# Patient Record
Sex: Female | Born: 1945 | Race: White | Hispanic: No | Marital: Married | Smoking: Never smoker
Health system: Southern US, Community
[De-identification: ages and names within clinical notes are randomized; demographics above are authoritative.]

## PROBLEM LIST (undated history)

## (undated) ENCOUNTER — Emergency Department (HOSPITAL_COMMUNITY): Admission: EM | Payer: 59 | Source: Home / Self Care

## (undated) DIAGNOSIS — I4891 Unspecified atrial fibrillation: Secondary | ICD-10-CM

## (undated) DIAGNOSIS — M810 Age-related osteoporosis without current pathological fracture: Secondary | ICD-10-CM

## (undated) DIAGNOSIS — I639 Cerebral infarction, unspecified: Secondary | ICD-10-CM

## (undated) DIAGNOSIS — Q2112 Patent foramen ovale: Secondary | ICD-10-CM

## (undated) DIAGNOSIS — D751 Secondary polycythemia: Secondary | ICD-10-CM

## (undated) DIAGNOSIS — E7800 Pure hypercholesterolemia, unspecified: Secondary | ICD-10-CM

## (undated) DIAGNOSIS — D66 Hereditary factor VIII deficiency: Secondary | ICD-10-CM

## (undated) DIAGNOSIS — I10 Essential (primary) hypertension: Secondary | ICD-10-CM

## (undated) DIAGNOSIS — Q211 Atrial septal defect: Secondary | ICD-10-CM

## (undated) DIAGNOSIS — N280 Ischemia and infarction of kidney: Secondary | ICD-10-CM

## (undated) DIAGNOSIS — T7841XA Arthus phenomenon, initial encounter: Secondary | ICD-10-CM

## (undated) DIAGNOSIS — E559 Vitamin D deficiency, unspecified: Secondary | ICD-10-CM

## (undated) DIAGNOSIS — K219 Gastro-esophageal reflux disease without esophagitis: Secondary | ICD-10-CM

## (undated) DIAGNOSIS — Z86718 Personal history of other venous thrombosis and embolism: Secondary | ICD-10-CM

## (undated) DIAGNOSIS — Q2111 Secundum atrial septal defect: Secondary | ICD-10-CM

## (undated) DIAGNOSIS — G459 Transient cerebral ischemic attack, unspecified: Secondary | ICD-10-CM

## (undated) DIAGNOSIS — I6350 Cerebral infarction due to unspecified occlusion or stenosis of unspecified cerebral artery: Secondary | ICD-10-CM

## (undated) DIAGNOSIS — Z87442 Personal history of urinary calculi: Secondary | ICD-10-CM

## (undated) DIAGNOSIS — K649 Unspecified hemorrhoids: Secondary | ICD-10-CM

## (undated) DIAGNOSIS — Z7901 Long term (current) use of anticoagulants: Secondary | ICD-10-CM

## (undated) DIAGNOSIS — I4892 Unspecified atrial flutter: Secondary | ICD-10-CM

## (undated) DIAGNOSIS — E538 Deficiency of other specified B group vitamins: Secondary | ICD-10-CM

## (undated) HISTORY — PX: CARDIAC ELECTROPHYSIOLOGY STUDY AND ABLATION: SHX1294

## (undated) HISTORY — PX: OTHER SURGICAL HISTORY: SHX169

## (undated) HISTORY — PX: CATARACT EXTRACTION W/ INTRAOCULAR LENS  IMPLANT, BILATERAL: SHX1307

## (undated) HISTORY — DX: Unspecified atrial fibrillation: I48.91

## (undated) HISTORY — DX: Pure hypercholesterolemia, unspecified: E78.00

## (undated) HISTORY — DX: Long term (current) use of anticoagulants: Z79.01

## (undated) HISTORY — DX: Cerebral infarction due to unspecified occlusion or stenosis of unspecified cerebral artery: I63.50

## (undated) HISTORY — DX: Unspecified atrial flutter: I48.92

## (undated) HISTORY — DX: Ischemia and infarction of kidney: N28.0

## (undated) HISTORY — DX: Personal history of other venous thrombosis and embolism: Z86.718

## (undated) HISTORY — DX: Transient cerebral ischemic attack, unspecified: G45.9

## (undated) HISTORY — DX: Hereditary factor VIII deficiency: D66

## (undated) HISTORY — PX: APPENDECTOMY: SHX54

## (undated) HISTORY — DX: Cerebral infarction, unspecified: I63.9

## (undated) HISTORY — DX: Unspecified hemorrhoids: K64.9

## (undated) HISTORY — PX: BREAST BIOPSY: SHX20

## (undated) HISTORY — DX: Arthus phenomenon, initial encounter: T78.41XA

## (undated) HISTORY — DX: Gastro-esophageal reflux disease without esophagitis: K21.9

---

## 1964-02-28 HISTORY — PX: APPENDECTOMY: SHX54

## 1989-02-27 HISTORY — PX: BREAST BIOPSY: SHX20

## 1994-02-27 HISTORY — PX: OTHER SURGICAL HISTORY: SHX169

## 1995-02-02 ENCOUNTER — Encounter: Payer: Self-pay | Admitting: *Deleted

## 1996-03-25 ENCOUNTER — Encounter: Payer: Self-pay | Admitting: Internal Medicine

## 1997-12-27 ENCOUNTER — Encounter: Payer: Self-pay | Admitting: Emergency Medicine

## 1997-12-27 ENCOUNTER — Emergency Department (HOSPITAL_COMMUNITY): Admission: EM | Admit: 1997-12-27 | Discharge: 1997-12-27 | Payer: Self-pay | Admitting: *Deleted

## 1998-01-05 ENCOUNTER — Inpatient Hospital Stay (HOSPITAL_COMMUNITY): Admission: AD | Admit: 1998-01-05 | Discharge: 1998-01-06 | Payer: Self-pay | Admitting: Internal Medicine

## 1998-09-09 ENCOUNTER — Other Ambulatory Visit: Admission: RE | Admit: 1998-09-09 | Discharge: 1998-09-09 | Payer: Self-pay | Admitting: *Deleted

## 1999-09-15 ENCOUNTER — Other Ambulatory Visit: Admission: RE | Admit: 1999-09-15 | Discharge: 1999-09-15 | Payer: Self-pay | Admitting: *Deleted

## 2000-01-03 ENCOUNTER — Ambulatory Visit (HOSPITAL_COMMUNITY): Admission: RE | Admit: 2000-01-03 | Discharge: 2000-01-03 | Payer: Self-pay | Admitting: Internal Medicine

## 2000-02-24 ENCOUNTER — Inpatient Hospital Stay (HOSPITAL_COMMUNITY): Admission: AD | Admit: 2000-02-24 | Discharge: 2000-02-27 | Payer: Self-pay | Admitting: Internal Medicine

## 2000-02-28 HISTORY — PX: CARDIAC ELECTROPHYSIOLOGY STUDY AND ABLATION: SHX1294

## 2004-01-06 ENCOUNTER — Ambulatory Visit: Payer: Self-pay | Admitting: Cardiology

## 2004-01-15 ENCOUNTER — Ambulatory Visit: Payer: Self-pay | Admitting: Cardiology

## 2004-02-12 ENCOUNTER — Ambulatory Visit: Payer: Self-pay | Admitting: Cardiology

## 2004-03-11 ENCOUNTER — Ambulatory Visit: Payer: Self-pay | Admitting: Cardiovascular Disease

## 2004-03-25 ENCOUNTER — Ambulatory Visit: Payer: Self-pay | Admitting: Cardiology

## 2004-04-04 ENCOUNTER — Other Ambulatory Visit: Admission: RE | Admit: 2004-04-04 | Discharge: 2004-04-04 | Payer: Self-pay | Admitting: Internal Medicine

## 2004-04-22 ENCOUNTER — Ambulatory Visit: Payer: Self-pay | Admitting: Cardiovascular Disease

## 2004-05-13 ENCOUNTER — Ambulatory Visit: Payer: Self-pay | Admitting: Cardiology

## 2004-05-27 ENCOUNTER — Ambulatory Visit: Payer: Self-pay | Admitting: Cardiology

## 2004-06-24 ENCOUNTER — Ambulatory Visit: Payer: Self-pay | Admitting: Cardiology

## 2004-07-22 ENCOUNTER — Ambulatory Visit: Payer: Self-pay | Admitting: Cardiology

## 2004-08-26 ENCOUNTER — Ambulatory Visit: Payer: Self-pay | Admitting: Cardiology

## 2004-09-23 ENCOUNTER — Ambulatory Visit: Payer: Self-pay | Admitting: Cardiology

## 2004-10-06 ENCOUNTER — Ambulatory Visit: Payer: Self-pay | Admitting: Internal Medicine

## 2004-10-21 ENCOUNTER — Ambulatory Visit: Payer: Self-pay | Admitting: Cardiology

## 2004-11-11 ENCOUNTER — Ambulatory Visit: Payer: Self-pay | Admitting: Internal Medicine

## 2004-12-12 ENCOUNTER — Ambulatory Visit: Payer: Self-pay | Admitting: Internal Medicine

## 2005-01-02 ENCOUNTER — Ambulatory Visit: Payer: Self-pay | Admitting: Cardiology

## 2005-01-24 ENCOUNTER — Ambulatory Visit: Payer: Self-pay | Admitting: Cardiology

## 2005-03-02 ENCOUNTER — Ambulatory Visit: Payer: Self-pay | Admitting: Cardiology

## 2005-04-11 ENCOUNTER — Ambulatory Visit: Payer: Self-pay | Admitting: Cardiology

## 2005-05-09 ENCOUNTER — Ambulatory Visit: Payer: Self-pay | Admitting: Cardiology

## 2005-06-06 ENCOUNTER — Ambulatory Visit: Payer: Self-pay | Admitting: Cardiology

## 2005-06-13 ENCOUNTER — Other Ambulatory Visit: Admission: RE | Admit: 2005-06-13 | Discharge: 2005-06-13 | Payer: Self-pay | Admitting: Internal Medicine

## 2005-07-04 ENCOUNTER — Ambulatory Visit: Payer: Self-pay | Admitting: Cardiology

## 2005-07-26 ENCOUNTER — Ambulatory Visit: Payer: Self-pay | Admitting: Internal Medicine

## 2005-09-14 ENCOUNTER — Ambulatory Visit: Payer: Self-pay | Admitting: Cardiology

## 2005-11-01 ENCOUNTER — Ambulatory Visit: Payer: Self-pay | Admitting: *Deleted

## 2005-11-29 ENCOUNTER — Ambulatory Visit: Payer: Self-pay | Admitting: Cardiology

## 2006-01-01 ENCOUNTER — Ambulatory Visit: Payer: Self-pay | Admitting: Cardiology

## 2006-01-29 ENCOUNTER — Ambulatory Visit: Payer: Self-pay | Admitting: Internal Medicine

## 2006-03-01 ENCOUNTER — Ambulatory Visit: Payer: Self-pay | Admitting: Cardiology

## 2006-03-22 ENCOUNTER — Ambulatory Visit: Payer: Self-pay | Admitting: Internal Medicine

## 2006-04-09 ENCOUNTER — Ambulatory Visit: Payer: Self-pay | Admitting: Internal Medicine

## 2006-04-19 ENCOUNTER — Ambulatory Visit: Payer: Self-pay | Admitting: Cardiology

## 2006-05-08 ENCOUNTER — Ambulatory Visit: Payer: Self-pay | Admitting: Cardiology

## 2006-06-05 ENCOUNTER — Ambulatory Visit: Payer: Self-pay | Admitting: Cardiology

## 2006-06-20 ENCOUNTER — Other Ambulatory Visit: Admission: RE | Admit: 2006-06-20 | Discharge: 2006-06-20 | Payer: Self-pay | Admitting: *Deleted

## 2006-07-04 ENCOUNTER — Ambulatory Visit: Payer: Self-pay | Admitting: *Deleted

## 2006-11-15 ENCOUNTER — Ambulatory Visit: Payer: Self-pay | Admitting: Cardiology

## 2006-12-13 ENCOUNTER — Ambulatory Visit: Payer: Self-pay | Admitting: Cardiology

## 2007-01-10 ENCOUNTER — Ambulatory Visit: Payer: Self-pay | Admitting: Cardiology

## 2007-02-07 ENCOUNTER — Ambulatory Visit: Payer: Self-pay | Admitting: Cardiology

## 2007-03-14 ENCOUNTER — Ambulatory Visit: Payer: Self-pay | Admitting: Internal Medicine

## 2007-04-11 ENCOUNTER — Ambulatory Visit: Payer: Self-pay | Admitting: Cardiology

## 2007-05-07 ENCOUNTER — Ambulatory Visit: Payer: Self-pay | Admitting: Cardiology

## 2007-06-04 ENCOUNTER — Ambulatory Visit: Payer: Self-pay | Admitting: Cardiovascular Disease

## 2007-07-02 ENCOUNTER — Ambulatory Visit: Payer: Self-pay | Admitting: Cardiovascular Disease

## 2007-11-19 ENCOUNTER — Ambulatory Visit: Payer: Self-pay | Admitting: Cardiology

## 2007-12-24 ENCOUNTER — Ambulatory Visit: Payer: Self-pay | Admitting: Cardiology

## 2008-01-28 ENCOUNTER — Ambulatory Visit: Payer: Self-pay | Admitting: Cardiology

## 2008-02-26 ENCOUNTER — Ambulatory Visit: Payer: Self-pay | Admitting: Cardiology

## 2008-03-25 ENCOUNTER — Ambulatory Visit: Payer: Self-pay | Admitting: Cardiovascular Disease

## 2008-03-31 ENCOUNTER — Encounter (INDEPENDENT_AMBULATORY_CARE_PROVIDER_SITE_OTHER): Payer: Self-pay | Admitting: Internal Medicine

## 2008-03-31 ENCOUNTER — Ambulatory Visit: Payer: Self-pay | Admitting: Cardiology

## 2008-03-31 ENCOUNTER — Inpatient Hospital Stay (HOSPITAL_COMMUNITY): Admission: EM | Admit: 2008-03-31 | Discharge: 2008-04-03 | Payer: Self-pay | Admitting: Emergency Medicine

## 2008-04-01 ENCOUNTER — Ambulatory Visit: Payer: Self-pay | Admitting: Oncology

## 2008-04-06 ENCOUNTER — Ambulatory Visit: Payer: Self-pay | Admitting: Oncology

## 2008-04-06 ENCOUNTER — Ambulatory Visit: Payer: Self-pay | Admitting: Cardiology

## 2008-04-06 LAB — CONVERTED CEMR LAB
INR: 5.1 — ABNORMAL HIGH (ref 0.8–1.0)
Prothrombin Time: 49.8 s — ABNORMAL HIGH (ref 10.9–13.3)

## 2008-04-13 ENCOUNTER — Ambulatory Visit: Payer: Self-pay | Admitting: Internal Medicine

## 2008-04-23 ENCOUNTER — Ambulatory Visit: Payer: Self-pay | Admitting: Cardiology

## 2008-04-23 ENCOUNTER — Ambulatory Visit: Payer: Self-pay | Admitting: Internal Medicine

## 2008-04-23 LAB — CONVERTED CEMR LAB
BUN: 16 mg/dL (ref 6–23)
CO2: 29 meq/L (ref 19–32)
Calcium: 9.4 mg/dL (ref 8.4–10.5)
Chloride: 104 meq/L (ref 96–112)
Creatinine, Ser: 0.8 mg/dL (ref 0.4–1.2)
GFR calc Af Amer: 93 mL/min
GFR calc non Af Amer: 77 mL/min
Glucose, Bld: 85 mg/dL (ref 70–99)
INR: 5.2 — ABNORMAL HIGH (ref 0.8–1.0)
Potassium: 4.5 meq/L (ref 3.5–5.1)
Prothrombin Time: 50.8 s — ABNORMAL HIGH (ref 10.9–13.3)
Sodium: 141 meq/L (ref 135–145)

## 2008-05-04 ENCOUNTER — Ambulatory Visit: Payer: Self-pay | Admitting: Internal Medicine

## 2008-05-11 LAB — CARDIOLIPIN ANTIBODIES, IGG, IGM, IGA: Anticardiolipin IgA: 8 [APL'U] (ref <13)

## 2008-05-11 LAB — FACTOR 5 LEIDEN: Activated Protein C Resistance: 1.34

## 2008-05-11 LAB — PROTHROMBIN GENE MUTATION

## 2008-05-11 LAB — BETA-2 GLYCOPROTEIN ANTIBODIES: Beta-2-Glycoprotein I IgA: <4 U/mL (ref <10)

## 2008-05-14 LAB — FACTOR 8 ASSAY: Coagulation Factor VIII: 276 % — ABNORMAL HIGH (ref 73–140)

## 2008-05-26 ENCOUNTER — Ambulatory Visit: Payer: Self-pay | Admitting: Cardiology

## 2008-06-23 ENCOUNTER — Ambulatory Visit: Payer: Self-pay | Admitting: Cardiology

## 2008-06-29 ENCOUNTER — Encounter: Payer: Self-pay | Admitting: Internal Medicine

## 2008-07-20 ENCOUNTER — Ambulatory Visit: Payer: Self-pay | Admitting: Cardiology

## 2008-07-20 ENCOUNTER — Ambulatory Visit: Payer: Self-pay | Admitting: Internal Medicine

## 2008-07-20 DIAGNOSIS — E7800 Pure hypercholesterolemia, unspecified: Secondary | ICD-10-CM

## 2008-07-20 DIAGNOSIS — T7841XA Arthus phenomenon, initial encounter: Secondary | ICD-10-CM | POA: Insufficient documentation

## 2008-07-21 LAB — CONVERTED CEMR LAB
ALT: 21 units/L (ref 0–35)
AST: 22 units/L (ref 0–37)
Albumin: 3.8 g/dL (ref 3.5–5.2)
Alkaline Phosphatase: 73 units/L (ref 39–117)
Bilirubin, Direct: 0.1 mg/dL (ref 0.0–0.3)
Cholesterol: 189 mg/dL (ref 0–200)
HDL: 49.7 mg/dL (ref >39.00)
LDL Cholesterol: 119 mg/dL — ABNORMAL HIGH (ref 0–99)
Total Bilirubin: 0.8 mg/dL (ref 0.3–1.2)
Total CHOL/HDL Ratio: 4
Total Protein: 7 g/dL (ref 6.0–8.3)
Triglycerides: 102 mg/dL (ref 0.0–149.0)
VLDL: 20.4 mg/dL (ref 0.0–40.0)

## 2008-07-28 ENCOUNTER — Encounter: Payer: Self-pay | Admitting: *Deleted

## 2008-08-05 ENCOUNTER — Encounter (INDEPENDENT_AMBULATORY_CARE_PROVIDER_SITE_OTHER): Payer: Self-pay | Admitting: *Deleted

## 2008-08-20 ENCOUNTER — Encounter (INDEPENDENT_AMBULATORY_CARE_PROVIDER_SITE_OTHER): Payer: Self-pay | Admitting: *Deleted

## 2008-08-20 ENCOUNTER — Encounter: Payer: Self-pay | Admitting: Internal Medicine

## 2008-08-20 LAB — CONVERTED CEMR LAB: POC INR: 3.2

## 2008-08-28 ENCOUNTER — Encounter: Payer: Self-pay | Admitting: Cardiology

## 2008-09-02 ENCOUNTER — Encounter: Payer: Self-pay | Admitting: *Deleted

## 2008-09-17 ENCOUNTER — Encounter: Payer: Self-pay | Admitting: Internal Medicine

## 2008-09-17 ENCOUNTER — Encounter: Payer: Self-pay | Admitting: Cardiovascular Disease

## 2008-09-17 LAB — CONVERTED CEMR LAB: POC INR: 2.7

## 2008-09-18 ENCOUNTER — Encounter (INDEPENDENT_AMBULATORY_CARE_PROVIDER_SITE_OTHER): Payer: Self-pay | Admitting: *Deleted

## 2008-10-13 ENCOUNTER — Encounter (INDEPENDENT_AMBULATORY_CARE_PROVIDER_SITE_OTHER): Payer: Self-pay | Admitting: *Deleted

## 2008-10-23 ENCOUNTER — Encounter (INDEPENDENT_AMBULATORY_CARE_PROVIDER_SITE_OTHER): Payer: Self-pay | Admitting: Cardiology

## 2008-10-23 ENCOUNTER — Encounter: Payer: Self-pay | Admitting: Cardiology

## 2008-10-23 LAB — CONVERTED CEMR LAB: POC INR: 2.6

## 2008-11-18 ENCOUNTER — Ambulatory Visit: Payer: Self-pay | Admitting: Internal Medicine

## 2008-11-18 DIAGNOSIS — I480 Paroxysmal atrial fibrillation: Secondary | ICD-10-CM

## 2008-11-18 DIAGNOSIS — I4892 Unspecified atrial flutter: Secondary | ICD-10-CM

## 2008-11-18 LAB — CONVERTED CEMR LAB: POC INR: 2.9

## 2008-12-04 ENCOUNTER — Ambulatory Visit: Payer: Self-pay | Admitting: Internal Medicine

## 2008-12-04 DIAGNOSIS — I10 Essential (primary) hypertension: Secondary | ICD-10-CM

## 2008-12-08 LAB — CONVERTED CEMR LAB
BUN: 16 mg/dL (ref 6–23)
Chloride: 101 meq/L (ref 96–112)
Creatinine, Ser: 0.89 mg/dL (ref 0.40–1.20)

## 2008-12-24 ENCOUNTER — Encounter: Payer: Self-pay | Admitting: Internal Medicine

## 2008-12-24 ENCOUNTER — Encounter: Payer: Self-pay | Admitting: Cardiology

## 2008-12-24 LAB — CONVERTED CEMR LAB: POC INR: 2.9

## 2009-01-19 ENCOUNTER — Telehealth: Payer: Self-pay | Admitting: Internal Medicine

## 2009-01-25 ENCOUNTER — Ambulatory Visit: Payer: Self-pay | Admitting: Cardiology

## 2009-01-25 LAB — CONVERTED CEMR LAB: POC INR: 3.7

## 2009-02-10 ENCOUNTER — Ambulatory Visit: Payer: Self-pay | Admitting: Internal Medicine

## 2009-02-10 LAB — CONVERTED CEMR LAB
AST: 22 units/L (ref 0–37)
Albumin: 3.8 g/dL (ref 3.5–5.2)
BUN: 10 mg/dL (ref 6–23)
Basophils Absolute: 0 10*3/uL (ref 0.0–0.1)
CO2: 29 meq/L (ref 19–32)
Chloride: 105 meq/L (ref 96–112)
Cholesterol: 194 mg/dL (ref 0–200)
Eosinophils Absolute: 0.2 10*3/uL (ref 0.0–0.7)
Glucose, Bld: 84 mg/dL (ref 70–99)
HCT: 43.4 % (ref 36.0–46.0)
Hemoglobin: 14.9 g/dL (ref 12.0–15.0)
Ketones, ur: NEGATIVE mg/dL
Lymphs Abs: 1.5 10*3/uL (ref 0.7–4.0)
MCHC: 34.3 g/dL (ref 30.0–36.0)
MCV: 93.2 fL (ref 78.0–100.0)
Monocytes Absolute: 0.5 10*3/uL (ref 0.1–1.0)
Monocytes Relative: 12.6 % — ABNORMAL HIGH (ref 3.0–12.0)
Neutro Abs: 1.6 10*3/uL (ref 1.4–7.7)
Platelets: 218 10*3/uL (ref 150.0–400.0)
Potassium: 4.4 meq/L (ref 3.5–5.1)
RDW: 11.9 % (ref 11.5–14.6)
Sodium: 140 meq/L (ref 135–145)
Specific Gravity, Urine: 1.01 (ref 1.000–1.030)
TSH: 1.7 microintl units/mL (ref 0.35–5.50)
Total Bilirubin: 0.7 mg/dL (ref 0.3–1.2)
VLDL: 20.4 mg/dL (ref 0.0–40.0)

## 2009-02-16 ENCOUNTER — Ambulatory Visit: Payer: Self-pay | Admitting: Internal Medicine

## 2009-02-16 ENCOUNTER — Other Ambulatory Visit: Admission: RE | Admit: 2009-02-16 | Discharge: 2009-02-16 | Payer: Self-pay | Admitting: Internal Medicine

## 2009-02-16 DIAGNOSIS — Z8679 Personal history of other diseases of the circulatory system: Secondary | ICD-10-CM | POA: Insufficient documentation

## 2009-02-16 DIAGNOSIS — K219 Gastro-esophageal reflux disease without esophagitis: Secondary | ICD-10-CM | POA: Insufficient documentation

## 2009-02-16 DIAGNOSIS — Z9189 Other specified personal risk factors, not elsewhere classified: Secondary | ICD-10-CM | POA: Insufficient documentation

## 2009-02-22 ENCOUNTER — Ambulatory Visit: Payer: Self-pay | Admitting: Internal Medicine

## 2009-02-22 ENCOUNTER — Encounter (INDEPENDENT_AMBULATORY_CARE_PROVIDER_SITE_OTHER): Payer: Self-pay | Admitting: *Deleted

## 2009-03-08 ENCOUNTER — Ambulatory Visit: Payer: Self-pay | Admitting: Cardiology

## 2009-04-05 ENCOUNTER — Ambulatory Visit: Payer: Self-pay | Admitting: Cardiovascular Disease

## 2009-04-05 LAB — CONVERTED CEMR LAB: POC INR: 2.5

## 2009-05-05 ENCOUNTER — Ambulatory Visit: Payer: Self-pay | Admitting: Cardiovascular Disease

## 2009-06-03 ENCOUNTER — Ambulatory Visit: Payer: Self-pay | Admitting: Cardiovascular Disease

## 2009-07-01 ENCOUNTER — Ambulatory Visit: Payer: Self-pay | Admitting: Internal Medicine

## 2009-07-01 LAB — CONVERTED CEMR LAB: POC INR: 3.3

## 2009-08-02 ENCOUNTER — Encounter: Payer: Self-pay | Admitting: Cardiology

## 2009-08-02 ENCOUNTER — Encounter (INDEPENDENT_AMBULATORY_CARE_PROVIDER_SITE_OTHER): Payer: Self-pay | Admitting: Pharmacist

## 2009-08-02 LAB — CONVERTED CEMR LAB: POC INR: 2.2

## 2009-08-19 ENCOUNTER — Encounter: Payer: Self-pay | Admitting: Internal Medicine

## 2009-08-19 ENCOUNTER — Encounter (INDEPENDENT_AMBULATORY_CARE_PROVIDER_SITE_OTHER): Payer: Self-pay | Admitting: Pharmacist

## 2009-09-21 ENCOUNTER — Encounter: Payer: Self-pay | Admitting: Cardiology

## 2009-09-21 LAB — CONVERTED CEMR LAB: POC INR: 2.5

## 2009-10-22 ENCOUNTER — Encounter: Payer: Self-pay | Admitting: Cardiology

## 2009-10-22 ENCOUNTER — Encounter: Payer: Self-pay | Admitting: Internal Medicine

## 2009-10-22 LAB — CONVERTED CEMR LAB: POC INR: 3.2

## 2009-11-24 ENCOUNTER — Ambulatory Visit: Payer: Self-pay | Admitting: Cardiology

## 2009-12-22 ENCOUNTER — Ambulatory Visit: Payer: Self-pay | Admitting: Internal Medicine

## 2009-12-22 ENCOUNTER — Ambulatory Visit: Payer: Self-pay | Admitting: Cardiovascular Disease

## 2010-01-25 ENCOUNTER — Telehealth: Payer: Self-pay | Admitting: Internal Medicine

## 2010-01-28 ENCOUNTER — Ambulatory Visit: Payer: Self-pay | Admitting: Cardiovascular Disease

## 2010-03-31 NOTE — Medication Information (Signed)
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 no    Recent/future dental: no  Any missed doses?: no       Is patient compliant with meds? yes       Allergies: 1)  ! Amiodarone Hcl  Anticoagulation Management History:      The patient is taking warfarin and comes in today for a routine follow up visit.  Negative risk factors for bleeding include an age less than 5 years old.  The bleeding index is 'low risk'.  Positive CHADS2 values include History of HTN.  Negative CHADS2 values include Age > 33 years old.  The start date was 06/02/1997.  Her last INR was 5.2 RATIO.  Anticoagulation responsible provider: Jens Som MD, Arlys John.  INR POC: 2.5.  Cuvette Lot#: [id number].  Exp: 05/2010.    Anticoagulation Management Assessment/Plan:      [name] current anticoagulation dose is Warfarin sodium 5 mg tabs: Use as directed by Anticoagulation Clinic.  The target INR is 2.5 - 3.5.  The next INR is due 05/05/2009.  Anticoagulation instructions were given to patient.  Results were reviewed/authorized by Bethena Midget, RN, BSN.  She was notified by Cloyde Reams RN.         Prior Anticoagulation Instructions: INR 2.7  Continue on same dosage 1 tablet daily except 1/2 tablet on Thursdays.   Recheck in 4 weeks.    Current Anticoagulation Instructions: INR 2.5 Continue 5mg s daily except 2.5mg s on Thursdays. Recheck in 4 weeks.

## 2010-03-31 NOTE — Medication Information (Signed)
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 no    Recent/future dental: no  Any missed doses?: no        Comments: [place] her with a script to have INR checked while there, with results faxed in to [place].    Allergies: 1)  ! Amiodarone Hcl  Anticoagulation Management History:      The patient is taking warfarin and comes in today for a routine follow up visit.  Positive risk factors for bleeding include an age of 66 years or older.  The bleeding index is 'intermediate risk'.  Positive CHADS2 values include History of HTN.  Negative CHADS2 values include Age > 66 years old.  The start date was 06/02/1997.  Her last INR was 5.2 RATIO.  Anticoagulation responsible provider: Glendale Youngblood MD, Reuel Boom.  INR POC: 3.3.  Cuvette Lot#: [id number].  Exp: 07/2010.    Anticoagulation Management Assessment/Plan:      [name] current anticoagulation dose is Warfarin sodium 5 mg tabs: Use as directed by Anticoagulation Clinic.  The target INR is 2.5 - 3.5.  The next INR is due 07/29/2009.  Anticoagulation instructions were given to patient.  Results were reviewed/authorized by Eda Keys, PharmD.  She was notified by Eda Keys.         Prior Anticoagulation Instructions: INR 2.6  Continue on same dosage 1 tablet daily except 1/2 tablet on Thursdays.  Recheck in 4 weeks.    Current Anticoagulation Instructions: INR 3.3  Continue taking  1/2 tablet on Thursday and 1 talbet all other days.  Please have INR checked in 4 weeks.  Please have results faxed or called into the coumadin clinic.

## 2010-03-31 NOTE — Progress Notes (Signed)
Summary: re changing her med   Phone Note Call from Patient   Caller: Patient [phone] Reason for Call: Talk to Nurse Summary of Call: pt calling re changing her med Initial call taken by: Glynda Jaeger,  January 25, 2010 12:28 PM  Follow-up for Phone Call        will have her INR checked on Fri at 11:15am  Pt aware  If INR < 2.0 will go ahead at start Pradaxa 150mg  two times a day Dennis Bast, RN, BSN  January 25, 2010 1:34 PM

## 2010-03-31 NOTE — Medication Information (Signed)
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 no    Recent/future dental: no  Any missed doses?: no       Is patient compliant with meds? yes       Allergies (verified): 1)  ! Amiodarone Hcl  Anticoagulation Management History:      The patient is taking warfarin and comes in today for a routine follow up visit.  Negative risk factors for bleeding include an age less than 74 years old.  The bleeding index is 'low risk'.  Positive CHADS2 values include History of HTN.  Negative CHADS2 values include Age > 79 years old.  The start date was 06/02/1997.  Her last INR was 5.2 RATIO.  Anticoagulation responsible provider: Jens Som MD, Arlys John.  INR POC: 2.7.  Cuvette Lot#: [id number].  Exp: 03/2010.    Anticoagulation Management Assessment/Plan:      [name] current anticoagulation dose is Warfarin sodium 5 mg tabs: Use as directed by Anticoagulation Clinic.  The target INR is 2.5 - 3.5.  The next INR is due 04/05/2009.  Anticoagulation instructions were given to patient.  Results were reviewed/authorized by Cloyde Reams, RN, BSN.  She was notified by Cloyde Reams RN.         Prior Anticoagulation Instructions: INR = 2.2  Take 1.5 tablets today then resume 1 tablet every day except half a tablet on Thursdays.  Recheck in 2 weeks  Current Anticoagulation Instructions: INR 2.7  Continue on same dosage 1 tablet daily except 1/2 tablet on Thursdays.   Recheck in 4 weeks.

## 2010-03-31 NOTE — Medication Information (Signed)
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 no    Recent/future dental: no  Any missed doses?: no       Is patient compliant with meds? yes       Allergies: 1)  ! Amiodarone Hcl  Anticoagulation Management History:      The patient is taking warfarin and comes in today for a routine follow up visit.  Positive risk factors for bleeding include an age of 66 years or older.  The bleeding index is 'intermediate risk'.  Positive CHADS2 values include History of HTN.  Negative CHADS2 values include Age > 20 years old.  The start date was 06/02/1997.  Her last INR was 5.2 RATIO.  Anticoagulation responsible provider: Eden Emms MD, Theron Arista.  INR POC: 2.6.  Cuvette Lot#: [id number].  Exp: 06/2010.    Anticoagulation Management Assessment/Plan:      [name] current anticoagulation dose is Warfarin sodium 5 mg tabs: Use as directed by Anticoagulation Clinic.  The target INR is 2.5 - 3.5.  The next INR is due 07/01/2009.  Anticoagulation instructions were given to patient.  Results were reviewed/authorized by Cloyde Reams, RN, BSN.  She was notified by Cloyde Reams RN.         Prior Anticoagulation Instructions: INR 2.7  Continue on same dosage 1 tablet daily except 1/2 tablet on Thursdays.  Recheck in 4 weeks.   Current Anticoagulation Instructions: INR 2.6  Continue on same dosage 1 tablet daily except 1/2 tablet on Thursdays.  Recheck in 4 weeks.

## 2010-03-31 NOTE — Medication Information (Signed)
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 no    Recent/future dental: no  Any missed doses?: no       Is patient compliant with meds? yes       Allergies: 1)  ! Amiodarone Hcl  Anticoagulation Management History:      Her anticoagulation is being managed by telephone today.  Positive risk factors for bleeding include an age of 66 years or older.  The bleeding index is 'intermediate risk'.  Positive CHADS2 values include History of HTN.  Negative CHADS2 values include Age > 66 years old.  The start date was 06/02/1997.  Her last INR was 5.2 RATIO.  Anticoagulation responsible provider: Juanda Chance MD, Smitty Cords.  INR POC: 2.5.    Anticoagulation Management Assessment/Plan:      [name] current anticoagulation dose is Warfarin sodium 5 mg tabs: Use as directed by Anticoagulation Clinic.  The target INR is 2.5 - 3.5.  The next INR is due 10/19/2009.  Anticoagulation instructions were given to patient.  Results were reviewed/authorized by Bethena Midget, RN, BSN.  She was notified by Bethena Midget, RN, BSN.         Prior Anticoagulation Instructions: INR 3.3  LM for pt Weston Brass PharmD  August 19, 2009 12:06 PM.  Spoke with pt.  Continue same dose of 1 tablet every day except 1/2 tablet on Thursday.  Recheck in 4 weeks.   Current Anticoagulation Instructions: INR 2.5  LM with pt's daughter. Weston Brass PharmD  September 21, 2009 11:39 AM   Spoke with pt and she denies any concerns. She is  aware to recheck in 4 weeks.

## 2010-03-31 NOTE — Medication Information (Signed)
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 no    Recent/future dental: no  Any missed doses?: no       Is patient compliant with meds? yes       Allergies: 1)  ! Amiodarone Hcl  Anticoagulation Management History:      Her anticoagulation is being managed by telephone today.  Positive risk factors for bleeding include an age of 66 years or older.  The bleeding index is 'intermediate risk'.  Positive CHADS2 values include History of HTN.  Negative CHADS2 values include Age > 31 years old.  The start date was 06/02/1997.  Her last INR was 5.2 RATIO.  Anticoagulation responsible provider: Antoine Poche MD, Fayrene Fearing.  INR POC: 2.2.    Anticoagulation Management Assessment/Plan:      [name] current anticoagulation dose is Warfarin sodium 5 mg tabs: Use as directed by Anticoagulation Clinic.  The target INR is 2.5 - 3.5.  The next INR is due 08/23/2009.  Anticoagulation instructions were given to patient.  Results were reviewed/authorized by Bethena Midget, RN, BSN.  She was notified by Weston Brass PharmD.         Prior Anticoagulation Instructions: INR 3.3  Continue taking 1/2 tablet on Thursday and 1 talbet all other days.  Please have INR checked in 4 weeks.  Please have results faxed or called into the coumadin clinic.    Current Anticoagulation Instructions: INR 2.2 LMOM for pt. to call back for dosing. Bethena Midget, RN, BSN  August 02, 2009 12:52  PM  Spoke with pt.  Take 1 1/2 tablets today then resume same dose of 1 tablet every day except 1/2 tablet on Thursday.  Weston Brass PharmD  August 02, 2009 1:28 PM

## 2010-03-31 NOTE — Medication Information (Signed)
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 no    Recent/future dental: no  Any missed doses?: no       Is patient compliant with meds? yes      Comments: Just got back from traveling 3 weeks to [place].    Allergies: 1)  ! Amiodarone Hcl  Anticoagulation Management History:      The patient is taking warfarin and comes in today for a routine follow up visit.  Positive risk factors for bleeding include an age of 66 years or older.  The bleeding index is 'intermediate risk'.  Positive CHADS2 values include History of HTN.  Negative CHADS2 values include Age > 1 years old.  The start date was 06/02/1997.  Her last INR was 5.2 RATIO.  Anticoagulation responsible provider: Eden Emms MD, Theron Arista.  INR POC: 2.7.  Cuvette Lot#: [id number].  Exp: 06/2010.    Anticoagulation Management Assessment/Plan:      [name] current anticoagulation dose is Warfarin sodium 5 mg tabs: Use as directed by Anticoagulation Clinic.  The target INR is 2.5 - 3.5.  The next INR is due 06/02/2009.  Anticoagulation instructions were given to patient.  Results were reviewed/authorized by Cloyde Reams, RN, BSN.  She was notified by Cloyde Reams RN.         Prior Anticoagulation Instructions: INR 2.5 Continue 5mg s daily except 2.5mg s on Thursdays. Recheck in 4 weeks.   Current Anticoagulation Instructions: INR 2.7  Continue on same dosage 1 tablet daily except 1/2 tablet on Thursdays.  Recheck in 4 weeks.

## 2010-03-31 NOTE — Medication Information (Signed)
Summary: rov/tm  Medications Added Patient will no longer be taking coumadin and is switching to PradaxaWARFARIN SODIUM 5 MG TABS (WARFARIN SODIUM) Use as directed by Anticoagulation Clinic WARFARIN SODIUM 5 MG TABS (WARFARIN SODIUM) Use as directed by Anticoagulation Clinic WARFARIN SODIUM 5 MG TABS (WARFARIN SODIUM) Use as directed by Anticoagulation Clinic METOPROLOL SUCCINATE 200 MG XR24H-TAB (METOPROLOL SUCCINATE) Take one tablet by mouth daily ASPIRIN 81 MG TBEC (ASPIRIN) Take 1/2 tablet by mouth daily MULTIVITAMINS  TABS (MULTIPLE VITAMIN) Take 1 tablet daily VITAMIN D 400 UNIT TABS (CHOLECALCIFEROL) Take 1 tablet daily VITAMIN D 400 UNIT TABS (CHOLECALCIFEROL) Take 1 tablet daily FISH OIL 1000 MG CAPS (OMEGA-3 FATTY ACIDS) Take 1 capsule once daily OSCAL 500/200 D-3 500-200 MG-UNIT TABS (CALCIUM-VITAMIN D) Take 2 tablets daily PRADAXA 150 MG CAPS (DABIGATRAN ETEXILATE MESYLATE) Take 1 capsule by mouth two times a day       Anticoagulant Therapy  Managed by: Inactive Referring MD: Lewayne Bunting PCP: Newt Lukes MD Supervising MD: Excell Seltzer MD, Casimiro Needle Indication 1: Atrial Fibrillation/flutter 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  Current Medications (verified): 1)  Metoprolol Succinate 200 Mg Xr24h-Tab (Metoprolol Succinate) .... Take One Tablet By Mouth Daily 2)  Aspirin 81 Mg Tbec (Aspirin) .... Take 1/2 Tablet By Mouth Daily 3)  Multivitamins  Tabs (Multiple Vitamin) .... Take 1 Tablet Daily 4)  Fish Oil 1000 Mg Caps (Omega-3 Fatty Acids) .... Take 1 Capsule Once Daily 5)  Oscal 500/200 D-3 500-200 Mg-Unit Tabs (Calcium-Vitamin D) .... Take 2 Tablets Daily 6)  Pradaxa 150  Mg Caps (Dabigatran Etexilate Mesylate) .... Take 1 Capsule By Mouth Two Times A Day  Allergies: 1)  ! Amiodarone Hcl  Anticoagulation Management History:      The patient is taking warfarin and comes in today for a routine follow up visit.  Positive risk factors for bleeding include an age of 14 years or older.  The bleeding index is 'intermediate risk'.  Positive CHADS2 values include History of HTN.  Negative CHADS2 values include Age > 62 years old.  The start date was 06/02/1997.  Her last INR was 5.2 RATIO.  Anticoagulation responsible provider: Excell Seltzer MD, Casimiro Needle.  INR POC: 1.3.  Exp: 12/2010.    Anticoagulation Management Assessment/Plan:      The target INR is 2.5 - 3.5.  The next INR is due 01/19/2010.  Anticoagulation instructions were given to patient.  Results were reviewed/authorized by Inactive.         Prior Anticoagulation Instructions: INR 2.6  Continue taking same dose of 1 tablet everyday except 1/2 tablet on Thursday. Recheck in 4 weeks at [facility].  Current Anticoagulation Instructions: Patient will no longer be taking coumadin and is switching to Pradaxa Prescriptions: PRADAXA 150 MG CAPS (DABIGATRAN ETEXILATE MESYLATE) Take 1 capsule by mouth two times a day  #60 x 3   Entered by:   Earvin Hansen PharmD   Authorized by:   Laren Boom, MD, [facility]   Signed by:   Earvin Hansen PharmD on 01/28/2010   Method used:   Electronically to        [facility]  [phone]* (  retail)       [address]       [place], [place]  [address]       Ph: [phone] or [phone]       Fax: [phone]   RxID:   [id number]

## 2010-03-31 NOTE — Medication Information (Signed)
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 no    Recent/future dental: no  Any missed doses?: no       Is patient compliant with meds? yes       Allergies: 1)  ! Amiodarone Hcl  Anticoagulation Management History:      The patient is taking warfarin and comes in today for a routine follow up visit.  Positive risk factors for bleeding include an age of 66 years or older.  The bleeding index is 'intermediate risk'.  Positive CHADS2 values include History of HTN.  Negative CHADS2 values include Age > 29 years old.  The start date was 06/02/1997.  Her last INR was 5.2 RATIO.  Anticoagulation responsible provider: Myrtis Ser MD, Tinnie Gens.  INR POC: 2.9.  Cuvette Lot#: [id number].  Exp: 12/2010.    Anticoagulation Management Assessment/Plan:      [name] current anticoagulation dose is Warfarin sodium 5 mg tabs: Use as directed by Anticoagulation Clinic.  The target INR is 2.5 - 3.5.  The next INR is due 12/22/2009.  Anticoagulation instructions were given to patient.  Results were reviewed/authorized by Weston Brass, PharmD.  She was notified by Harrel Carina, PharmD candidate.         Prior Anticoagulation Instructions: INR 3.2  Called spoke with pt, advised to continue on same dosage 1 tablet daily except 1/2 tablet on Thursdays.  Recheck in 4 weeks.    Current Anticoagulation Instructions: INR 2.9  Continue taking 1 tablet everyday except take 1/2 tablet on Thursdays. Recheck INR in 4 weeks.

## 2010-03-31 NOTE — Medication Information (Signed)
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 no     Any missed doses?: no       Is patient compliant with meds? yes       Allergies: 1)  ! Amiodarone Hcl  Anticoagulation Management History:      The patient is taking warfarin and comes in today for a routine follow up visit.  Positive risk factors for bleeding include an age of 61 years or older.  The bleeding index is 'intermediate risk'.  Positive CHADS2 values include History of HTN.  Negative CHADS2 values include Age > 57 years old.  The start date was 06/02/1997.  Her last INR was 5.2 RATIO.  Anticoagulation responsible provider: Antoine Poche MD, Fayrene Fearing.  INR POC: 3.2.  Exp: 07/2010.    Anticoagulation Management Assessment/Plan:      [name] current anticoagulation dose is Warfarin sodium 5 mg tabs: Use as directed by Anticoagulation Clinic.  The target INR is 2.5 - 3.5.  The next INR is due 11/24/2009.  Anticoagulation instructions were given to patient.  Results were reviewed/authorized by Cloyde Reams, RN, BSN.  She was notified by Cloyde Reams RN.         Prior Anticoagulation Instructions: INR 2.5  LM with pt's daughter. Weston Brass PharmD  September 21, 2009 11:39 AM   Spoke with pt and she denies any concerns. She is aware to recheck in 4 weeks.   Current Anticoagulation Instructions: INR 3.2  Called spoke with pt, advised to continue on same dosage 1 tablet daily except 1/2 tablet on Thursdays.  Recheck in 4 weeks.

## 2010-03-31 NOTE — Medication Information (Signed)
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 no    Recent/future dental: no  Any missed doses?: no       Is patient compliant with meds? yes       Allergies: 1)  ! Amiodarone Hcl  Anticoagulation Management History:      The patient is taking warfarin and comes in today for a routine follow up visit.  Positive risk factors for bleeding include an age of 11 years or older.  The bleeding index is 'intermediate risk'.  Positive CHADS2 values include History of HTN.  Negative CHADS2 values include Age > 106 years old.  The start date was 06/02/1997.  Her last INR was 5.2 RATIO.  Anticoagulation responsible provider: Eden Emms MD, Theron Arista.  INR POC: 2.6.  Cuvette Lot#: [id number].  Exp: 12/2010.    Anticoagulation Management Assessment/Plan:      [name] current anticoagulation dose is Warfarin sodium 5 mg tabs: Use as directed by Anticoagulation Clinic.  The target INR is 2.5 - 3.5.  The next INR is due 01/19/2010.  Anticoagulation instructions were given to patient.  Results were reviewed/authorized by Weston Brass, PharmD.  She was notified by Ilean Skill D candidate.         Prior Anticoagulation Instructions: INR 2.9  Continue taking 1 tablet everyday except take 1/2 tablet on Thursdays. Recheck INR in 4 weeks.   Current Anticoagulation Instructions: INR 2.6  Continue taking same dose of 1 tablet everyday except 1/2 tablet on Thursday. Recheck in 4 weeks at [facility].

## 2010-03-31 NOTE — Assessment & Plan Note (Signed)
Summary: yearly/sl    Primary Provider:  Newt Lukes MD  CC:  yearly check up.  History of Present Illness: [name] returns today for followup.  She is a pleasant 65 yo woman with a h/o PAF, a h/o embolic phenomena and HTN.  She denies c/p, sob, peripheral edema or palpitations.    No other complaints today.  She is interested in home coumadin monitoring as well as anti-coagulation alternatives.   Current Medications (verified): 1)  Warfarin Sodium 5 Mg Tabs (Warfarin Sodium) .... Use As Directed By Anticoagulation Clinic 2)  Metoprolol Succinate 200 Mg Xr24h-Tab (Metoprolol Succinate) .... Take One Tablet By Mouth Daily 3)  Aspirin 81 Mg Tbec (Aspirin) .... Take 1/2 Tablet By Mouth Daily 4)  Multivitamins  Tabs (Multiple Vitamin) .... Take 1 Tablet Daily 5)  Fish Oil 1000 Mg Caps (Omega-3 Fatty Acids) .... Take 1 Capsule Once Daily 6)  Oscal 500/200 D-3 500-200 Mg-Unit Tabs (Calcium-Vitamin D) .... Take 2 Tablets Daily  Allergies: 1)  ! 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 retired from [employer] Tobacco Use - No.  Alcohol Use - no  Review of Systems  The patient denies chest pain, syncope, dyspnea on exertion, and peripheral edema.    Vital Signs:  Patient profile:   65 year old female Height:      68 inches Weight:      152 pounds BMI:      23.20 Pulse rate:   59 / minute Resp:     12 per minute BP sitting:   130 / 82  (left arm)  Vitals Entered By: Kem Parkinson (December 22, 2009 9:05 AM)  Physical Exam  General:  alert, well-developed, well-nourished, and cooperative to examination.    Head:  normocephalic and atraumatic Eyes:  PERRLA/EOM intact; conjunctiva and lids normal. Mouth:  Teeth, gums and palate normal. Oral mucosa normal. Neck:  Neck supple, no JVD. No masses, thyromegaly or abnormal cervical nodes. Chest Wall:  no deformities or breast masses noted Lungs:  Clear bilaterally to auscultation with no wheezes, rales or rhonchi. Heart:  normal rate, regular rhythm, no murmur, and no rub. BLE without edema. normal DP pulses and normal cap refill in all 4 extremities    Abdomen:  soft, non-tender, normal bowel sounds, no distention; no masses and no appreciable hepatomegaly or splenomegaly.   Msk:  No deformity or scoliosis noted of thoracic or lumbar spine.   Pulses:  pulses normal in all 4 extremities Extremities:  No clubbing or cyanosis. Neurologic:  Alert and oriented x 3.   EKG  Procedure date:  12/22/2009  Findings:      Normal sinus rhythm with rate of:  70  Impression & Recommendations:  Problem # 1:  ATRIAL FIBRILLATION (ICD-427.31) She appears to be maintaining NSR.  I have asked her to continue her current meds.  She is considering Pradaxa and for now would like to start doing home monitoring of her PT/INR. She will continue her meds for now, Her updated medication list for  this problem includes:    Warfarin Sodium 5 Mg Tabs (Warfarin sodium) ..... Use as directed by anticoagulation clinic    Metoprolol Succinate 200 Mg Xr24h-tab (Metoprolol succinate) .Marland Kitchen... Take one tablet by mouth daily    Aspirin 81 Mg Tbec (Aspirin) .Marland Kitchen... Take 1/2 tablet by mouth daily  Problem # 2:  ESSENTIAL HYPERTENSION, BENIGN (ICD-401.1) Her blood pressure appears to be well controlled. She will maintain a low  sodium diet. Her updated medication list for this problem includes:    Metoprolol Succinate 200 Mg Xr24h-tab (Metoprolol succinate) .Marland Kitchen... Take one tablet by mouth daily    Aspirin 81 Mg Tbec (Aspirin) .Marland Kitchen... Take 1/2 tablet by mouth daily  Problem # 3:  PURE HYPERCHOLESTEROLEMIA (ICD-272.0) She will maintain a low fat diet.  Patient Instructions: 1)  Your physician wants you to follow-up in:  12 months with Dr Ladona Ridgel Bonita Quin will receive a reminder letter in the mail two months in advance. If you don't receive a letter, please call our office to schedule the follow-up appointment. 2)  Patient is going to check the price of Pradaxa and get back to [place] if she wants to change to the drug.  She will take 150mg  two times a day Prescriptions: METOPROLOL SUCCINATE 200 MG XR24H-TAB (METOPROLOL SUCCINATE) Take one tablet by mouth daily  #90 x 3   Entered by:   Dennis Bast, RN, BSN   Authorized by:   Laren Boom, MD, [facility]   Signed by:   Dennis Bast, RN, BSN on 12/22/2009   Method used:   Electronically to        [facility]  [phone]* (retail)       [address]       [place], [place]  [address]       Ph: [phone] or [phone]       Fax: [phone]   RxID:   [id number]

## 2010-03-31 NOTE — Medication Information (Signed)
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 no    Recent/future dental: no  Any missed doses?: no       Is patient compliant with meds? yes       Allergies: 1)  ! Amiodarone Hcl  Anticoagulation Management History:      Positive risk factors for bleeding include an age of 66 years or older.  The bleeding index is 'intermediate risk'.  Positive CHADS2 values include History of HTN.  Negative CHADS2 values include Age > 103 years old.  The start date was 06/02/1997.  Her last INR was 5.2 RATIO.  Anticoagulation responsible provider: Jakki Doughty MD, Fayrene Fearing.  INR POC: 3.3.  Exp: 07/2010.    Anticoagulation Management Assessment/Plan:      [name] current anticoagulation dose is Warfarin sodium 5 mg tabs: Use as directed by Anticoagulation Clinic.  The target INR is 2.5 - 3.5.  The next INR is due 09/16/2009.  Anticoagulation instructions were given to patient.  Results were reviewed/authorized by Weston Brass, PharmD.  She was notified by Weston Brass PharmD.         Prior Anticoagulation Instructions: INR 2.2 LMOM for pt. to call back for dosing. Bethena Midget, RN, BSN  August 02, 2009 12:52 PM  Spoke with pt.  Take 1 1/2 tablets today then resume same dose of 1 tablet every day except 1/2 tablet on Thursday.  Weston Brass PharmD  August 02, 2009 1:28 PM   Current Anticoagulation Instructions: INR 3.3  LM for pt Weston Brass PharmD  August 19, 2009 12:06 PM.  Spoke with pt.  Continue same dose of 1  tablet every day except 1/2 tablet on Thursday.  Recheck in 4 weeks.

## 2010-06-08 ENCOUNTER — Other Ambulatory Visit: Payer: Self-pay | Admitting: Internal Medicine

## 2010-06-14 LAB — CBC
HCT: 38.2 % (ref 36.0–46.0)
HCT: 38.6 % (ref 36.0–46.0)
HCT: 40 % (ref 36.0–46.0)
HCT: 40.1 % (ref 36.0–46.0)
HCT: 42.8 % (ref 36.0–46.0)
Hemoglobin: 13.6 g/dL (ref 12.0–15.0)
Hemoglobin: 13.9 g/dL (ref 12.0–15.0)
Hemoglobin: 13.9 g/dL (ref 12.0–15.0)
MCHC: 34.8 g/dL (ref 30.0–36.0)
MCHC: 35.6 g/dL (ref 30.0–36.0)
MCHC: 36 g/dL (ref 30.0–36.0)
MCV: 90.7 fL (ref 78.0–100.0)
MCV: 90.7 fL (ref 78.0–100.0)
MCV: 90.8 fL (ref 78.0–100.0)
MCV: 91 fL (ref 78.0–100.0)
MCV: 92 fL (ref 78.0–100.0)
Platelets: 124 K/uL — ABNORMAL LOW (ref 150–400)
Platelets: 148 K/uL — ABNORMAL LOW (ref 150–400)
Platelets: 156 K/uL (ref 150–400)
Platelets: 180 10*3/uL (ref 150–400)
Platelets: 212 10*3/uL (ref 150–400)
RBC: 4.2 MIL/uL (ref 3.87–5.11)
RBC: 4.26 MIL/uL (ref 3.87–5.11)
RBC: 4.36 MIL/uL (ref 3.87–5.11)
RBC: 4.4 MIL/uL (ref 3.87–5.11)
RDW: 12.2 % (ref 11.5–15.5)
RDW: 12.2 % (ref 11.5–15.5)
RDW: 12.4 % (ref 11.5–15.5)
RDW: 12.5 % (ref 11.5–15.5)
WBC: 7.1 K/uL (ref 4.0–10.5)
WBC: 7.3 10*3/uL (ref 4.0–10.5)
WBC: 7.8 K/uL (ref 4.0–10.5)
WBC: 8.5 K/uL (ref 4.0–10.5)

## 2010-06-14 LAB — DIFFERENTIAL
Basophils Absolute: 0 K/uL (ref 0.0–0.1)
Basophils Relative: 0 % (ref 0–1)
Eosinophils Absolute: 0.1 K/uL (ref 0.0–0.7)
Eosinophils Relative: 1 % (ref 0–5)
Lymphocytes Relative: 19 % (ref 12–46)
Lymphocytes Relative: 49 % — ABNORMAL HIGH (ref 12–46)
Lymphs Abs: 1.5 K/uL (ref 0.7–4.0)
Lymphs Abs: 2.7 10*3/uL (ref 0.7–4.0)
Monocytes Absolute: 0.5 10*3/uL (ref 0.1–1.0)
Monocytes Absolute: 0.8 K/uL (ref 0.1–1.0)
Monocytes Relative: 10 % (ref 3–12)
Monocytes Relative: 9 % (ref 3–12)
Neutro Abs: 2.2 10*3/uL (ref 1.7–7.7)
Neutro Abs: 5.5 K/uL (ref 1.7–7.7)
Neutrophils Relative %: 70 % (ref 43–77)

## 2010-06-14 LAB — URINALYSIS, ROUTINE W REFLEX MICROSCOPIC
Glucose, UA: NEGATIVE mg/dL
Hgb urine dipstick: NEGATIVE
Specific Gravity, Urine: 1.011 (ref 1.005–1.030)
Urobilinogen, UA: 0.2 mg/dL (ref 0.0–1.0)
pH: 8 (ref 5.0–8.0)

## 2010-06-14 LAB — URINE CULTURE: Colony Count: >=100000

## 2010-06-14 LAB — COMPREHENSIVE METABOLIC PANEL WITH GFR
ALT: 74 U/L — ABNORMAL HIGH (ref 0–35)
ALT: 76 U/L — ABNORMAL HIGH (ref 0–35)
AST: 38 U/L — ABNORMAL HIGH (ref 0–37)
AST: 54 U/L — ABNORMAL HIGH (ref 0–37)
Albumin: 3 g/dL — ABNORMAL LOW (ref 3.5–5.2)
Albumin: 3 g/dL — ABNORMAL LOW (ref 3.5–5.2)
Alkaline Phosphatase: 74 U/L (ref 39–117)
Alkaline Phosphatase: 77 U/L (ref 39–117)
BUN: 6 mg/dL (ref 6–23)
BUN: 7 mg/dL (ref 6–23)
CO2: 25 meq/L (ref 19–32)
CO2: 29 meq/L (ref 19–32)
Calcium: 8.8 mg/dL (ref 8.4–10.5)
Calcium: 9 mg/dL (ref 8.4–10.5)
Chloride: 103 meq/L (ref 96–112)
Chloride: 104 meq/L (ref 96–112)
Creatinine, Ser: 1.05 mg/dL (ref 0.4–1.2)
Creatinine, Ser: 1.13 mg/dL (ref 0.4–1.2)
GFR calc Af Amer: 59 mL/min — ABNORMAL LOW (ref >60)
GFR calc Af Amer: >60 mL/min (ref >60)
GFR calc non Af Amer: 49 mL/min — ABNORMAL LOW (ref >60)
GFR calc non Af Amer: 53 mL/min — ABNORMAL LOW (ref >60)
Glucose, Bld: 93 mg/dL (ref 70–99)
Glucose, Bld: 99 mg/dL (ref 70–99)
Potassium: 4.2 meq/L (ref 3.5–5.1)
Potassium: 4.6 meq/L (ref 3.5–5.1)
Sodium: 136 meq/L (ref 135–145)
Sodium: 137 meq/L (ref 135–145)
Total Bilirubin: 1 mg/dL (ref 0.3–1.2)
Total Bilirubin: 1.2 mg/dL (ref 0.3–1.2)
Total Protein: 5.8 g/dL — ABNORMAL LOW (ref 6.0–8.3)
Total Protein: 6.2 g/dL (ref 6.0–8.3)

## 2010-06-14 LAB — COMPREHENSIVE METABOLIC PANEL
AST: 21 U/L (ref 0–37)
Albumin: 3.6 g/dL (ref 3.5–5.2)
BUN: 13 mg/dL (ref 6–23)
BUN: 8 mg/dL (ref 6–23)
CO2: 27 mEq/L (ref 19–32)
Creatinine, Ser: 0.93 mg/dL (ref 0.4–1.2)
GFR calc Af Amer: >60 mL/min (ref >60)
Glucose, Bld: 87 mg/dL (ref 70–99)
Potassium: 4.4 mEq/L (ref 3.5–5.1)
Sodium: 134 mEq/L — ABNORMAL LOW (ref 135–145)
Total Protein: 6.5 g/dL (ref 6.0–8.3)

## 2010-06-14 LAB — PROTIME-INR
INR: 1.9 — ABNORMAL HIGH (ref 0.00–1.49)
INR: 2.8 — ABNORMAL HIGH (ref 0.00–1.49)
INR: 2.9 — ABNORMAL HIGH (ref 0.00–1.49)
INR: 3.2 — ABNORMAL HIGH (ref 0.00–1.49)
Prothrombin Time: 23.3 s — ABNORMAL HIGH (ref 11.6–15.2)
Prothrombin Time: 31.2 s — ABNORMAL HIGH (ref 11.6–15.2)
Prothrombin Time: 32.3 s — ABNORMAL HIGH (ref 11.6–15.2)
Prothrombin Time: 35.6 s — ABNORMAL HIGH (ref 11.6–15.2)

## 2010-06-14 LAB — LUPUS ANTICOAGULANT PANEL
DRVVT: 60.4 s — ABNORMAL HIGH (ref 36.1–47.0)
Lupus Anticoagulant: NOT DETECTED
PTT Lupus Anticoagulant: 200 s — ABNORMAL HIGH (ref 36.3–48.8)
PTTLA 4:1 Mix: 126.8 s — ABNORMAL HIGH (ref 36.3–48.8)
PTTLA Confirmation: 0 s (ref <8.0)
dRVVT Incubated 1:1 Mix: 40.1 s (ref 36.1–47.0)

## 2010-06-14 LAB — FACTOR 8 ASSAY: Coagulation Factor VIII: 274 % — ABNORMAL HIGH (ref 73–140)

## 2010-06-14 LAB — CARDIOLIPIN ANTIBODIES, IGG, IGM, IGA
Anticardiolipin IgA: 10 [APL'U] — ABNORMAL LOW (ref <13)
Anticardiolipin IgG: <7 [GPL'U] — ABNORMAL LOW (ref <11)
Anticardiolipin IgM: <7 [MPL'U] — ABNORMAL LOW (ref <10)

## 2010-06-14 LAB — HEPATITIS PANEL, ACUTE
HCV Ab: NEGATIVE
Hep A IgM: NEGATIVE
Hep B C IgM: NEGATIVE
Hepatitis B Surface Ag: NEGATIVE

## 2010-06-14 LAB — BETA-2-GLYCOPROTEIN I ABS, IGG/M/A
Beta-2 Glyco I IgG: <4 U/mL (ref <20)
Beta-2-Glycoprotein I IgA: <4 U/mL (ref <10)
Beta-2-Glycoprotein I IgM: <4 U/mL (ref <10)

## 2010-06-14 LAB — APTT: aPTT: 29 s (ref 24–37)

## 2010-06-14 LAB — URINE MICROSCOPIC-ADD ON

## 2010-06-14 LAB — HEPARIN LEVEL (UNFRACTIONATED)
Heparin Unfractionated: 0.28 [IU]/mL — ABNORMAL LOW (ref 0.30–0.70)
Heparin Unfractionated: 0.4 IU/mL (ref 0.30–0.70)
Heparin Unfractionated: 0.53 [IU]/mL (ref 0.30–0.70)

## 2010-06-14 LAB — BRAIN NATRIURETIC PEPTIDE: Pro B Natriuretic peptide (BNP): 159 pg/mL — ABNORMAL HIGH (ref 0.0–100.0)

## 2010-06-14 LAB — HOMOCYSTEINE: Homocysteine: 8.3 umol/L (ref 4.0–15.4)

## 2010-06-14 LAB — FACTOR 5 LEIDEN

## 2010-07-12 NOTE — Letter (Signed)
April 23, 2008    J. Dorena Dew  [facility]  [facility] [address]  [place], [place] [address]   RE:  Kendra [name]  MRN:  [id number]  /  DOB:  [birth year]   Dear Kendra [name],   I am writing to refer a patient to you for additional evaluation.  The  patient's name is Kendra [name].  She is a 65 year old woman who is the  wife of our hospital lawyer Kendra [name]) and the daughter of a  pediatric pulmonologist.  I think either at your institution or perhaps  at [facility], I cannot remember which.  The patient initially came to  medical attention about 13 years ago when she had a stroke to her  kidney.  The is a clear embolic phenomena demonstrated at that time,  which appear to involve her left kidney.  At that time, she had had no  history of arrhythmias and underwent extensive workup both here as well  as over at [facility] having seen Dr. Terrilee Files at that  time.  There was no specific etiology and there was no obviously  demonstrated hypercoagulable state.  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  The patient was  subsequently developed atrial arrhythmias including initially typical  atrial flutter, which I initially saw her back in 2000.  At that time,  she underwent catheter ablation of her atrial flutter and this worked  out quite nicely, but unfortunately she developed recurrent atrial  flutter secondary to a different short circuit.  Initially, this was  treated with medical therapy, but because of her desire not to be on  medications long-term, she ultimately was re-referred to Dr. Ronn Melena, who saw the patient in December 2001.  She ultimately underwent  a left atrial ablation procedure.  She has never had AFib.  She had had  atrial flutter and was found at the time of her left atrial ablation to  have extensive scarring in the posterior portion of the left atrium and  atypical atrial flutter.   She was also found  at that time to have a  very clear-cut PFO.  The patient initially had recurrent atrial  arrhythmias after her ablation, but over years, these have been very  quiescent.  Despite this, but because of her hypercoagulable state, we  had kept her on Coumadin.  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  There was also evidence of emboli to the right kidney  though not as extensive.  This was in the setting of an INR of 1.9.  She  was hospitalized initially and placed her on heparin and Coumadin and  has subsequently been run in the 2.5 to 3.5 range and the patient has  been stable.  However, she clearly still has a PFO, and I am referring  her to you today for consideration of whether or not PFO closure would  be a consideration.  The patient and my mind has 2 clear sources of  emboli, first from her very scarred up left atrium particularly in the  posterior portion of left atrium as well as from her venous source.  The  patient does have concerns about whether or not PFO closure might result  in it, being more difficult for transseptal catheterization and  take  place, if in fact she needed another atrial ablation.  With all this in  mind, I have very much appreciate you considering seen Kendra [name].  If  you have any additional questions about her past medical history, I  realize this has been  quite lengthy.  Please do not hesitate to contact  me.    Sincerely,      Doylene Canning. Ladona Ridgel, MD  Electronically Signed    GWT/MedQ  DD: 04/23/2008  DT: 04/24/2008  Job #: [id number]

## 2010-07-12 NOTE — Consult Note (Signed)
NAMENANCYANN, COTTERMAN               ACCOUNT NO.:  [id number]   MEDICAL RECORD NO.:  [id number]          PATIENT TYPE:  INP   LOCATION:  6730                         FACILITY:  [facility]   PHYSICIAN:  Aram Beecham B. Eliott Nine, M.D.DATE OF BIRTH:  November 09, 1945   DATE OF CONSULTATION:  04/01/2008  DATE OF DISCHARGE:                                 CONSULTATION   We are asked to see this patient to provide any additional insight into  recurrent bilateral renal infarcts.   She is a 65 year old woman who has a remote (1990s) history of a left  renal infarction.  The diagnosis of hypercoagulable state is carried  through her medical records although the actual details of that workup  are not available.  She also has a history of atrial  fibrillation/flutter and has been ablated for this in the past.  She has  been on Coumadin long-term and this is  followed at the [facility].  She has not had any recent episodes of atrial fibrillation (the patient  states that she is well aware when her cardiac rhythm is not in sinus).   On the present occasion, she was admitted through the emergency  department with abdominal pain.  CT scan of the abdomen and pelvis done  in the ED showed wedge-shaped enhancements in the left upper pole as  well as the mid and lower poles of the left kidney and one small wedge-  shaped defect on the right.  Both of her main renal arteries were  patent, and there was only scant atherosclerosis noted for age.  The  origin of the renal arteries also appeared normal.  Delayed images  showed good excretion from the right kidney but diminished excretion of  contrast on the left.  Creatinine has changed little if any with the  events of the past 2 days (0.93-1.07).  Her urinalysis has been negative  for blood or protein.   She has been on Coumadin for many years (since the 1990's) with this  diagnosis of a hypercoagulable state and her PT/INR on her admission  dose of 2.5 mg on  Tuesdays and Thursdays and 5 mg on other days was 1.9  when evaluated in the ED.   She states that back around the time she was initially diagnosed, there  was a question of whether she might have had some TIAs, and she saw Dr.  Sandria Manly and he did a fair amount of blood work, but she is not sure what  this was.   She has had a 2-D echocardiogram this admission which did not show any  source of emboli (no clot and no vegetations) but she does have a patent  foramen ovale which has apparently been known.  She also has some  evidence by echo for some mild pulmonary hypertension.   PAST MEDICAL HISTORY:  1. Hypertension.  2. Hypercoagulable state (not sure details of the workup, and this      was remote with a prior history of a left renal infarct in the      1990s.)  3. History of atrial fibrillation/flutter with  prior ablation therapy      on chronic anticoagulation.  4. Diastolic dysfunction by echo.  5. Patent foramen ovale.  6. Changes of mild pulmonary hypertension by echo   OUTPATIENT MEDICATIONS:  Her outpatient medicines were limited to:  1. Metoprolol.  2. Coumadin.   INTOLERANCES:  SHE IS NOT TOLERANT OF AMIODARONE.   CURRENT MEDICATIONS:  1. Aspirin 325 mg a day.  2. Metoprolol 200 mg a day.  3. Coumadin.  4. Heparin.  5. P.R.N. medications for pain and nausea.   FAMILY HISTORY:  Positive for a stroke in her maternal grandmother who  did not have hypertension, and also a stroke in her maternal aunt, and  it is unclear if she had hypertension.  Her mother died of what was  thought to be a pulmonary embolus and had a history of milk leg (I  presume this was DVT).   SOCIAL HISTORY:  The patient is married.  Husband is the attorney for  the hospital here.  She has four children, two daughters and two sons,  and one of her daughters is a pediatric pulmonologist at [employer].  She hikes  and swims and has remained very active.   REVIEW OF SYSTEMS:  Positive for some  occasional patches dry skin  (husband called this a rash, but she says it comes and goes and  generally responds to emollients and sometimes need steroids).  She has  had no chest pain or shortness of breath, no nausea or vomiting.  She  has had right upper quadrant and some flank pain which led to this  admission.  There is a very remote history of a questionable TIA.  She  has had no weakness, no fevers, chills or weight loss.   PHYSICAL EXAMINATION:  GENERAL:  She is a very pleasant white female in  no acute distress.  VITAL SIGNS:  Blood pressure 141/67.  SKIN:  A small rough spot over the left dorsum of her foot and some  actinic lesions scattered around her body but no obvious rashes.  She  has no neck vein distention.  No bruits in the neck.  LUNGS:  Clear to auscultation.  CARDIAC:  Rhythm is regular, S1-S2, no S3.  ABDOMEN:  Nondistended.  Bowel sounds are present.  There are no bruits  that I can hear.  She does have some tenderness in the right upper  quadrant and generalized bilateral flank discomfort with palpation.  There are no bruits heard in the femorals.  She has no edema.  Her  dorsalis pedis and posterior tibial pulses are both 1+ and symmetric,  and she has no splinter hemorrhages under her nails.   LABORATORY DATA:  Sodium 134, potassium 4.4, chloride 100, CO2 of 27,  creatinine 1.07, SGOT 85, SGPT 77, albumin 3.2.  Urinalysis negative for  protein and blood.  CT findings are as noted in history of present illness.  PT/INR is 2.8.   IMPRESSION:  We have a 65 year old white female who has a reported  history of a hypercoagulable state diagnosed at the time of a remote  left renal infarct who presents now with bilateral renal infarctions and  a slightly subtherapeutic INR.  If you look at the data from patients  with renal infarctions, most of these occur secondary to clot emboli in  patients with atrial fibrillation.  This patient is in normal sinus  rhythm and  had no atrial clot on echo, although she does have a patent  foramen  ovale, so I cannot say that she did not have a clot.  However,  she can normally tell when she is in atrial fibrillation and had no  symptoms.   The second common occurrence for renal infarctions is patients who have  complex plaque in the aorta causing thromboemboli.  However, her CT scan  from the emergency department showed minimal atherosclerosis for age.  She has no vegetations on echo, no reasons to have a fat embolus, no  evidence on CT scan for renal artery stenosis and no recent endovascular  procedures that could have iatrogenically caused this problem.   About the only vasculitic lesion that I can think of that might cause  segmental renal infarction would be polyarteritis, and this would  require  aortography in order to determine; less invasive measures are  not as good.  Would check a sed rate, and if this is totally normal,  would not pursue that as a diagnosis (however this would seem extremely  unlikely give the fact that her first event was over 15 years ago - if  this was the etiology there would have been other interim  manifestations).   In short, I think we are likely to be left with her hypercoagulable  state as a primary etiology, with a slightly subtherapeutic INR.  As to  why the kidney is the selected organ, I have no answer for that.   RECOMMENDATIONS:  1. Agree with full dose of anticoagulation and consultation by      hematology.  2. Consider checking a sed rate, and if it was markedly elevated might      consider an aortogram to look for changes of polyarteritis, but      would need to keep in mind that if there was plaque there, she      could then have subsequent issues with atheroemboli.  If the sed      rate is normal, would not pursue this at all. And I will say I      think this would be highly unlikely for reasons described above.  3. She does have mild pulmonary hypertension.   Is it possible that she      has had small asymptomatic pulmonary emboli, or is this simply of      function of her cardiac issues (just thinking about looking at      other places for clots).  4. Unclear to me why she has the transaminase elevations.  I wonder if      this does also represent a thrombosis-type phenomenon.   I appreciate the opportunity to see this patient.  We will follow along  in the hospital. Thanks for the opportunity to participate in her care.      Duke Salvia Eliott Nine, M.D.  Electronically Signed     CBD/MEDQ  D:  04/01/2008  T:  04/01/2008  Job:  [id number]

## 2010-07-12 NOTE — Consult Note (Signed)
Kendra [name], SUITS               ACCOUNT NO.:  [id number]   MEDICAL RECORD NO.:  [id number]          PATIENT TYPE:  INP   LOCATION:                               FACILITY:  [facility]   PHYSICIAN:  Genene Churn. Granfortuna, M.D.DATE OF BIRTH:  04/25/1945   DATE OF CONSULTATION:  04/01/2008  DATE OF DISCHARGE:                                 CONSULTATION   This is a Hematology Consultation requested to evaluate this lady for  acute renal infarction.   Kendra [name] is a pleasant 65 year old woman who gives a history of left  renal infarction, occurring about 15 years ago.  Presumed etiology at  that time was embolic related to newly diagnosed atrial  fibrillation/flutter.  She has been on full-dose Coumadin  anticoagulation since that time.  Medical therapy was unsuccessful.  She  underwent a cardioversion in November 2001, which only worked  temporarily.  She subsequently underwent a radiofrequency ablation  procedure at [facility] by Dr. Delena Serve in December 2001.  Her arrhythmia has  been well controlled since that time, although she still takes Toprol.  About 7 years ago, she had some transient vertigo and was felt to  possibly have had a transient ischemic attack.  MRI of the brain  reported to her as normal.   She woke up late Monday evening.  She felt that  she had to have a bowel  movement.  She had a normal bowel movement and then almost immediately  after developed acute severe bilateral lower abdominal pain.  Her  husband brought her to the emergency department.  Regular abdominal x-  rays showed increased stool in the colon but no free air and no signs of  obstruction.  A urinalysis was negative for bacteria, protein, or blood.  A CT scan of the abdomen and pelvis was then obtained, which showed  multiple areas of wedge-shaped infarctions in the pole of the left  kidney with a single area of infarction in the mid pole of the right  kidney.  The aorta appeared normal as did the renal  arteries.  The  portal venous system and major abdominal arterial structures appeared  normal.  There was no significant aortic atherosclerosis.  A 2-D  echocardiogram showed evidence for diastolic dysfunction but overall  normal left and right ventricular function.  Mild pulmonary  hypertension.  No obvious clots.  Mild dilatation of the right atrium  and mild mitral regurgitation.  A bubble study was not done.  There is a  poorly documented history of a patent foramen ovale.   Of note is the fact that on her current dose of Coumadin, INR at the  time of admission was slightly subtherapeutic at 1.9.  A PTT was 29  seconds.  CBC with hemoglobin 14.6, hematocrit 43, MCV 91, white count  5600, 40 neutrophils, 49 lymphocytes, 9 monocytes, 2 eosinophils, and  platelet count 212,000.  BUN 13, creatinine 0.9.  Transaminase enzymes  initially normal with SGOT 21, SGPT 24 with subsequent mild rise to 85  and 77 respectively.   She states that she has not been hypertensive  and in fact, blood  pressure runs low on her Toprol.  She has had no major surgery in the  past.  She had a C-section with 1 of her children.   She has a remote history of migraine headaches, and over the last year,  she has had 2 or 3 isolated episodes where she had transient blurred  vision, lasting about 20 minutes, without any associated headache, which  resolved spontaneously and for which, she did not seek medical  attention.   She has not noted any paresthesias or painful or cyanotic extremities.   She has no history of inflammatory arthritis.  No signs or symptoms of a  collagen vascular disorder.   Her mother died with complications of childbirth at age 30, and family  believes that she had a pulmonary embolus.  A maternal grandmother had a  stroke in her late 83s.  A sister who is in her late 45s has had a  stroke.  One brother died in his early 59s of lung cancer.  One brother  and one sister are alive and  healthy.   She is a never smoker and does not use alcohol.  She has 4 children, 2  girls and 2 boys who are healthy.  One of her daughters is a Research officer, political party on the faculty at [employer].   PAST MEDICAL HISTORY:  As noted above.  She had remote renal stones.  No  MI.  No asthma, emphysema, or tuberculosis.  No hepatitis, yellow  jaundice, or malaria.  No thyroid problems.  No bleeding problems.  Questionable TIA in the past.   MEDICATIONS AT THE TIME OF ADMISSION:  1. Toprol-XL 200 mg daily.  2. Coumadin 2.5 mg Tuesdays and Thursdays and 5 mg other days of the      week.   ALLERGIES:  AMIODARONE.   FAMILY HISTORY:  As noted.   SOCIAL HISTORY:  She is married.  Husband is a Clinical research associate here in town.  They have 4 children, one daughter is a Optometrist on the faculty at  [employer] [place].  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   REVIEW OF SYSTEMS:  Unremarkable except for HPI.  She has had no  constitutional symptoms.  She had a remote breast biopsy in the past,  which was benign.  She is very active.  No anorexia.  No weight loss.   PHYSICAL EXAMINATION:  GENERAL:  She is a pleasant Caucasian woman,  currently in no distress.  VITAL SIGNS:  Systolic blood pressures ranging between 134-144 with  diastolic 67-82, pulse is 75 and regular, respirations 20, temperature  98.7.  SKIN:  Hair and nails normal.  No ecchymosis, petechiae, or rash.  HEENT:  Pupils equal and reactive to light.  Ophthalmoscope not  available.  Pharynx, no erythema or exudate.  NECK:  Supple.  Carotids are 2+.  No bruits.  No thyromegaly or thyroid  mass.  LUNGS:  Clear and resonant to percussion.  There is no flank or  costovertebral angle tenderness.  CARDIAC:  There is a regular cardiac rhythm without murmur or rub.  No  gallop.  LYMPHATIC:  No cervical, supraclavicular, axillary or inguinal  lymphadenopathy.  ABDOMEN:  Soft, tender in the right lower  quadrant.  Active bowel  sounds.  No palpable mass or organomegaly.  EXTREMITIES:  No edema.  No calf tenderness.  No cyanosis.  No petechial  hemorrhages.  VASCULAR:  Carotid pulses 2+.  No bruits.  No pulsatile mass in the  abdomen.  No abdominal or femoral bruits.  The radial pulses are 2+ and  symmetric, ulnar pulses 1+ and symmetric, dorsalis pedis pulses 2+ and  symmetric, posterior tibial pulses 1+ and symmetric, femoral pulses 2+  and symmetric.   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  Current event occurred while on borderline therapeutic Coumadin with an  INR of 1.9.   Despite her cardiac history of initially refractory atrial arrhythmias,  she has been stable since a radiofrequency ablation procedure and as far  as we know, has been in sinus rhythm.  She was told she had a small  patent foramen ovale in the past.  It is not clear whether this has been  reevaluated recently.  A bubble study was not done with the current  echocardiogram.  No obvious cardiac source is seen on a 2-D  echocardiogram done yesterday.   Assuming that she has a small clinically insignificant patent foramen  ovale, then it would be reasonable to look for underlying congenital or  acquired coagulation abnormalities that may have predisposed her to  these events.   RECOMMENDATIONS:  I agree with full-dose heparin and/or low molecular  weight heparin anticoagulation.  I would treat this as a new clot and  give her a minimum of 7-10 days of anticoagulation with a heparin  derivative.  I would continue her Coumadin and aim for a higher INR of  2.5-3.5.  I agree with the addition of low-dose aspirin 81 mg, which I  would continue on a chronic basis.   I will go ahead and assess for a congenital and acquired coagulopathy.  In addition to the usual profile, I will check a factor VIII activity.  Among the  congenital coagulopathies, arterial thrombosis is quite rare.  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  In fact in the Journal of Blood this month, there is a report  of a synergistic effect of elevated factor VIII and associated factor V  Leiden that magnifies the clotting risk, presumably through increasing  protein C resistance.   I would like to discuss her situation with her cardiologist to see  whether or not further evaluation of the PFO is indicated.   Thank you for this consultation.      Genene Churn. Cyndie Chime, M.D.  Electronically Signed     JMG/MEDQ  D:  04/01/2008  T:  04/02/2008  Job:  [id number]   cc:   Renee Ramus, MD  Doylene Canning. Ladona Ridgel, MD  Dr. Pilar Jarvis B. Eliott Nine, M.D.

## 2010-07-12 NOTE — Assessment & Plan Note (Signed)
[facility]                         ELECTROPHYSIOLOGY OFFICE NOTE   NAME:Kendra [name], Kendra [name]                      MRN:          [id number]  DATE:04/23/2008                            DOB:          June 22, 1945    [name] returns today for a followup.  She is a very pleasant 65-  year-old woman with a history of atrial arrhythmias and recurrent  embolic phenomenon, who returns today for followup.  The patient was  hospitalized several weeks ago with severe nausea and abdominal  discomfort and subsequently found to have embolism to her left kidney as  well as a small into the right kidney.  Despite this, her initial renal  function remained stable.  Ultimately, she improved.  It was noted that  she had a renal infarct in the setting of a subtherapeutic INR  (1.8/1.9).  The patient stabilized and was treated with IV heparin and  Lovenox and returns today for followup.  Her Coumadin level today is  elevated at 6 and is being redrawn.  Since discharge from the hospital,  she has done well.  She denies chest pain.  She denies shortness of  breath.  She denies peripheral edema.  She has otherwise been stable.   CURRENT MEDICATIONS:  1. Coumadin 5 mg as directed.  2. Toprol 200 mg daily.  3. Multivitamin.  4. Os-Cal.   PHYSICAL EXAMINATION:  GENERAL:  She is a pleasant well-appearing, 89-  year-old woman in no distress.  VITAL SIGNS:  Blood pressure is 122/62, the pulse is 76 and regular, the  respirations were 18, and the weight was 153 pounds.  NECK:  No jugular venous distention.  LUNGS:  Clear bilaterally to auscultation.  No wheezes, rales, or  rhonchi are present.  There is no increased work of breathing.  CARDIOVASCULAR:  Regular rate and rhythm.  Normal S1 and S2.  ABDOMEN:  Soft and nontender.  EXTREMITIES:  Demonstrated no edema.   IMPRESSION:  1. Recurrent renal infarcts.  2. Paroxysmal atrial fibrillation, status post ablation back  several      years ago.  3. Patent foramen ovale, unclear whether this is related to her and      embolism or not.  4. Borderline hypertension.  5. History of hypercoagulable state, poorly characterized.   DISCUSSION:  Kendra [name] is stable today and continues to do well though  her INR is elevated and has been redrawn.  We will also obtain a BMP to  see where creatinine and BUN look like.  A fair amount of time was taken  to discuss the possibility of referral for PFO closure.  At this point,  it is unclear to me that PFO closure is warranted, though she very  clearly has had 2 distinct renal infarcts separated by span of 13 years.  The most recent was in the setting of a subtherapeutic INR.  That being  said there may be some advantage in closing her PFO.  Going against this  will be in the need and limitation that closing the PFO might have on  whether she might be able  to be a candidate for repeat AFib ablation  procedure down the road if that would be necessary.  I will plan to  refer the patient to Dr. Bernette Redbird over [facility] to help [place] consider  these issues.     Doylene Canning. Ladona Ridgel, MD  Electronically Signed    GWT/MedQ  DD: 04/23/2008  DT: 04/23/2008  Job #: [id number]   cc:   [facility] Dr. Bernette Redbird, Cardiology  Division

## 2010-07-12 NOTE — Discharge Summary (Signed)
[name], [name]               ACCOUNT NO.:  [id number]   MEDICAL RECORD NO.:  [id number]          PATIENT TYPE:  INP   LOCATION:  6730                         FACILITY:  [facility]   PHYSICIAN:  Peggye Pitt, M.D. DATE OF BIRTH:  Jul 01, 1945   DATE OF ADMISSION:  03/31/2008  DATE OF DISCHARGE:  04/03/2008                               DISCHARGE SUMMARY   DISCHARGE DIAGNOSES:  1. Bilateral renal infarction.  2. Transaminitis.  3. Klebsiella urinary tract infection.  4. Atrial fibrillation.   DISCHARGE MEDICATIONS:  1. Metoprolol 200 mg daily.  2. Keflex 250 mg 3 times a day until April 05, 2008.  3. Lovenox 70 mg injected subcutaneously until April 06, 2008.  4. Coumadin 7.5 mg on Monday, Wednesday, Friday, and Sunday and 5 mg      on Tuesday, Thursday, and Saturday until further seen at the      [facility]madin Clinic.  5. Zofran 4 mg every 6 hours as needed for nausea.   DISPOSITION AND FOLLOWUP:  The patient is discharged home in stable  condition.  She will be seen at the [facility] on Monday  for INR check.  Please note that her goal INR has changed to 2.5-3.5.  At the time of followup with Dr. Ladona Ridgel on April 23, 2008, a liver  panel should be drawn to evaluate her transaminitis.   CONSULTATIONS THIS HOSPITALIZATION:  1. Doylene Canning. Ladona Ridgel, MD with [facility] Cardiology.  2. Genene Churn. Cyndie Chime, MD with Hematology/Oncology.  3. [facility] Salvia. Eliott Nine, MD with Nephrology.   IMAGES AND PROCEDURES PERFORMED DURING THIS HOSPITALIZATION:  1. An acute abdominal x-ray on March 31, 2008, that showed a      nonobstructive bowel gas pattern with no free air, retained stool      throughout the colon with no acute cardiopulmonary abnormality.  2. A CT scan of the abdomen and pelvis on March 31, 2008, that      showed acute multifocal left renal infarcts with confluent      involvement of the upper lobe.  Small solitary acute right renal      mid pole infarct.  Given the  relative absence of atherosclerosis of      the aorta, embolic phenomenon is suspected and consider cardiac      sources of emboli.  No acute findings in the pelvis.  3. A 2-D echocardiogram on March 31, 2008, that has showed an      ejection fraction of 60% with no left ventricular regional wall      motion abnormalities.  Doppler parameters are consistent with a      left ventricular diastolic compliance with mild pulmonary      hypertension.  She also has a PFO.   HISTORY AND PHYSICAL EXAMINATION:  For full details, please refer to  history and physical dictated by Dr. Janice Norrie on March 31, 2008, but in  brief, Kendra [name] is a 65 year old woman who on day of admission had  acute onset of a left flank abdominal pain, accompanied by nausea and  vomiting.  Pain lasted for about 30 minutes.  Because of intensity of  the pain, she decided to come into the hospital for evaluation where a  CT scan was performed in the emergency department, has showed renal  infarctions, and she is brought into the hospital for further evaluation  and management.   HOSPITAL COURSE BY PROBLEM:  1. For her bilateral renal infarcts, she has been kept on her      Coumadin.  Dr. Cyndie Chime has seen the patient and has recommended      increasing her goal INR to 2.5-3.5.  She will also be on Lovenox      for 5 days to complete the VTE core.  Upon discharge, she still has      3 days left of the Lovenox.  Possible sources of emboli include a      possibility of maybe a left atrial clot given her AFib although      none was visualized on the 2-D echo.  Also, there is a possibility      of increased activity of Factor VIII.  She will follow up with Dr.      Ladona Ridgel and with Dr. Cyndie Chime.  2. For her transaminitis, an acute hepatitis panel has been negative.      Her CT scan does not show any evidence of gallbladder disease or      biliary dilatation.  At this point, source of transaminitis is      unknown.  I  do wonder if this has anything to do with her renal      infarcts.  Her transaminases are trending down, and plan is for a      repeat CMET when she follows up with Dr. Ladona Ridgel on April 23, 2008.  3. Klebsiella urinary tract infection, diagnosed while in the      hospital:  She will be on Keflex for a total of 5 days.  4. For her history of atrial fibrillation, she is rate controlled on      her metoprolol and she is anticoagulated on Coumadin.  Rest of chronic medical issues were not a problem this hospitalization.   VITAL SIGNS UPON DISCHARGE:  Blood pressure 122/70, heart rate 68,  respirations 16, O2 sats 99% on room air with a temperature of 98.8.   LABORATORY DATA ON DAY OF DISCHARGE:  Sodium 137, potassium 4.6,  chloride 104, bicarb 29, BUN 6, creatinine 1.13 with a glucose of 93.  Total bilirubin 1.0, alk phos 77, AST 38, ALT 74, total protein 6.2, and  an albumin of 9.0.  Her INR is 2.9.  WBCs 7.8, hemoglobin 13.6, and a  platelet count of 148.      Peggye Pitt, M.D.  Electronically Signed     EH/MEDQ  D:  04/03/2008  T:  04/04/2008  Job:  [id number]   cc:   Doylene Canning. Ladona Ridgel, MD  [facility] Sal[facility] Eliott Nine, M.D.  Genene Churn. Cyndie Chime, M.D.

## 2010-07-12 NOTE — H&P (Signed)
Kendra [name], Kendra [name]               ACCOUNT NO.:  [id number]   MEDICAL RECORD NO.:  [id number]          PATIENT TYPE:  INP   LOCATION:  6730                         FACILITY:  [facility]   PHYSICIAN:  Renee Ramus, MD       DATE OF BIRTH:  [birth year]   DATE OF ADMISSION:  03/31/2008  DATE OF DISCHARGE:                              HISTORY & PHYSICAL   HISTORY OF PRESENT ILLNESS:  The patient is a 65 year old female who  awoke this morning with acute abdominal pain.  She rating it as 10/10.  The patient did have a previous history of abdominal pain involving  renal infarcts.  The patient has not had any symptoms, however, from  this for approximately 10-15 years.  The patient denies fevers, chills,  night sweats, nausea, vomiting, chest pain, shortness breath, PND or  orthopnea.  Pain lasted for approximately 30 minutes.  She was taken to  the emergency department, given pain medications.  Her pain is now  resolved predominantly; however, she does have a remnant of pain and  rates her pain currently at 2/10.  The patient did have abdominal  ultrasound showing an acute bilateral renal infarcts predominantly on  the left.  The patient does have a history of atrial fibrillation and  she is on anticoagulation with a INR of 1.9.  The patient has had a  previous extensive workup for hematological causes of  hypercoagulability, this was before, and was discovered that she had  atrial fibrillation and atrial flutter.  The patient is status post  several ablations and currently is in sinus rhythm.   PAST MEDICAL HISTORY:  1. Atrial fibrillation, status post multiple ablations.  2. Hypertension.  3. History of prior thromboembolic event involving renal infarcts.   SOCIAL HISTORY:  No alcohol or tobacco use, lives with her husband.   FAMILY HISTORY:  Not available.   REVIEW OF SYSTEMS:  All other comprehensive review of systems are  negative.   ALLERGIES:  AMIODARONE.   CURRENT MEDICATIONS:  1. Metoprolol XL 200 mg p.o. daily.  2. Coumadin 2.5 mg p.o. on Tuesdays and Thursdays, and 5 mg p.o. daily      on other days.   PHYSICAL EXAMINATION:  GENERAL:  This is a well-developed, well-  nourished white female, currently in no apparent distress.  VITAL SIGNS:  Blood pressure 124/65, heart rate 57, respiratory rate 20,  and temperature 98.1.  HEENT:  No jugular venous distention or lymphadenopathy.  Oropharynx is  clear.  Mucous membranes pink and moist.  TMs are clear bilaterally.  Pupils are equally reactive to light and accommodation.  Extraocular  muscles are intact.  CARDIOVASCULAR:  Regular rate and rhythm with occasional PVCs.  PULMONARY:  Lungs are clear to auscultation bilaterally.  ABDOMEN:  Soft, nontender, and nondistended without hepatosplenomegaly.  Bowel sounds are present.  She has no rebound or guarding.  EXTREMITIES:  She has no clubbing, cyanosis, or edema.  She has good  peripheral pulses in dorsalis pedis and radial arteries.  She is able to  move all extremities.  NEUROLOGIC:  Cranial nerves are  II through XII are grossly intact.  She  has no focal neurological deficits.   STUDIES:  1. Plain film of the abdomen showing retained stool, but no signs of      obstruction.  2. CT of the abdomen and pelvis shows acute multifocal left renal      infarcts and a small right renal infarct.   LABORATORIES:  INR 1.9, WBCs 5.6, H and H 14.6 and 42.8, MCV 90, and  platelets 212.  Sodium 136, potassium 3.9, chloride 101, bicarb 26, BUN  13, creatinine 0.9, and glucose 130.  UA shows moderate leukocytes, but  no evidence of hemorrhage or infection.   ASSESSMENT AND PLAN:  1. Acute renal infarcts bilaterally.  This has to be the result of a      shower from her left atria.  The patient will need a      transesophageal echocardiography to investigate further possibility      of valvular abnormalities versus left ventricular thrombus versus      possible vegetation.   The patient has no evidence of systemic      illness and I believe that endocarditis is a very very unlikely      possibility.  Cardiology is following.  We will place the patient      on a heparin drip.  We will also give her aspirin.  We will      continue with Coumadin.  We will raise her therapeutic INR level to      2-1/2 to 3-1/2, and we will obtain a transthoracic echocardiogram      initially with further intervention and plan per Cardiology.  2. Atrial fibrillation.  We will continue rate control Toprol-XL.  3. Hypertension, currently stable.   DISPOSITION:  The patient is full code.  H and P was constructed by  reviewing past medical history, conferring with emergency medical room  physician and reviewing the emergency medical record.   TIME SPENT:  One hour.      Renee Ramus, MD  Electronically Signed     JF/MEDQ  D:  03/31/2008  T:  03/31/2008  Job:  [id number]   cc:   Luis Abed, MD, [facility]

## 2010-07-12 NOTE — Consult Note (Signed)
Kendra, [name] NO.:  [id number]   MEDICAL RECORD NO.:  [id number]          PATIENT TYPE:  INP   LOCATION:  6730                         FACILITY:  [facility]   PHYSICIAN:  Luis Abed, MD, FACCDATE OF BIRTH:  Feb 16, 1946   DATE OF CONSULTATION:  03/31/2008  DATE OF DISCHARGE:                                 CONSULTATION   Kendra [name] is 65 years of age and is known well to Dr. Lewayne Bunting  of [facility].  The patient historically has had problems with  atrial fib and atrial flutter.  She has had multiple ablations.  The  last procedures were done at [facility] by Dr. Ronn Melena.  Historically,  over time since then she has not had significant palpitations.  She is  on full-dose Coumadin.  She has not had significant signs of heart  failure.  She has been fully active.  Her primary physician had  previously been in [place], but over time her primary physicians were  not able to practice anymore and the patient has been seen in [place] where her daughter is a Development worker, community.  The last record from Dr. Ladona Ridgel  was a followup visit in the office in February of 2008.   Kendra [name] does get her Coumadin checked at our [facility].  In  the last week prior to admission, her INR per the patient was in the  range of 2.4.   On the evening of March 30, 2008, into the morning of March 31, 2008, the patient had abdominal pain.  It was severe.  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  With pain medicine, she was stabilizing.  She is  admitted by Dr. Renee Ramus of the [facility] Team.  I was called to see  her for cardiology evaluation.   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  I do  not know the timing of this relative to her initiation of Coumadin.  It  is my understanding also that the patient had extensive workup for a  hypercoagulable state many  years ago and this was negative.   ALLERGIES:  AMIODARONE.   MEDICATIONS:  1. Metoprolol XL 200 mg daily.  2. Coumadin 2.5 on Tuesdays and Thursdays and 5 on other days.   OTHER MEDICAL PROBLEMS:  See the complete list below.   SOCIAL HISTORY:  The patient does not smoke or drink.  She lives with  her husband Kendra [name] here in [place].   FAMILY HISTORY:  Noncontributory.   REVIEW OF SYSTEMS:  When I saw her, she was stable.  She had no  headache.  There were no eye problems.  There was no shortness of  breath.  There was no chest pain.  She had mild residual ongoing  abdominal discomfort.  She had no other muscle aches or pains.  There  was no fever or chills.  She had no skin rashes by history.  Otherwise,  review of systems was negative.  PHYSICAL EXAMINATION:  VITAL SIGNS:  Blood pressure was 130/70 with a  pulse of 60.  Respiration rate was 19.  Temperature was 98.1.  GENERAL:  The patient was oriented to person, time, and place.  Affect  was normal.  Her husband was in the room.  HEENT:  No xanthelasma.  She had normal extraocular motions.  NECK:  There were no carotid bruits.  There was no jugular venous  distention.  LUNGS:  Clear.  Respiratory effort was not labored.  CARDIAC:  Revealed an S1 with an S2.  There were no clicks or  significant murmurs.  ABDOMEN:  Mildly tender.  EXTREMITIES:  She had no significant peripheral edema.   EKG showed no acute ST change.  One PVC was noted.  She was in sinus  rhythm.  She had a CT of the abdomen showing multiple findings in her  left kidney suggestive of renal infarcts and also one in the right  kidney.  Hemoglobin was 13.7.  Platelets 180,000.  BUN 8, creatinine  1.07.  Chest x-ray raised the question of mild diffuse increased  interstitial prominence.  INR was 1.9.   PROBLEMS:  1. History of atrial fibrillation and atrial flutter with multiple      ablations in the past.  2. Current normal sinus rhythm.  3.  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  4. Acute onset on the day of admission with new renal infarcts.  5. History of good LV function.  6. No proven history in the past of any type of hypercoagulable state.   I reviewed the situation carefully with both the patient, her husband,  and Dr. Janice Norrie, her primary care.  On a therapeutic basis, I recommended  a full dose IV heparin drip and continuation of her Coumadin to raise  her INR to at least 2.5.  I did recommend starting aspirin yesterday,  but the decision about long-term aspirin will be up to Dr. Lewayne Bunting.  A 2-D echo was recommended to fully assess her overall status.  Based on  the 2-D echo finding and further consideration, transesophageal echo can  be considered, but was not planned as of yesterday.  Careful followup of  her renal function will be important.  Also pain medications because of  the infarcts.   There is no indication for any type of interventional procedure to the  kidneys or any type of lytic therapy to the kidneys.  The most important  issue over time will be continued high level anticoagulation.      Luis Abed, MD, [facility]  Electronically Signed     JDK/MEDQ  D:  04/01/2008  T:  04/01/2008  Job:  [id number]   cc:   Doylene Canning. Ladona Ridgel, MD

## 2010-07-15 NOTE — Procedures (Signed)
[place]. [facility]  Patient:    Kendra, [name]                      MRN: [id number] Proc. Date: 01/03/00 Adm. Date:  [id number] Disc. Date: [id number] Attending:  Lewayne Bunting CC:         [name]. [name], M.D.   Procedure Report  PROCEDURE PERFORMED:  DC cardioversion  INDICATIONS:  Recurrent atrial flutter.  I:  INTRODUCTION:  The patient is a very pleasant 65 year old woman with a history of atrial flutter who is status post catheter ablation two years ago. She did well but developed recurrent atrial flutter and is referred now for DC cardioversion.  II:  PROCEDURE: After informed consent was obtained, the patient was prepped and sedated in the usual manner.  Dr. Jacklynn Bue with the anesthesia service was utilized and delivered 200 mg of sodium pentothal.  200 joules of synchronized energy was subsequently delivered, restoring sinus rhythm. The patient tolerated the procedure well.  She was allowed to wake up and discharged home without any immediate procedural complication.  III:  COMPLICATIONS:  None.  IV:  RESULTS:  This demonstrates successful DC cardioversion with a total of 200 joules of energy restoring the sinus rhythm from atrial flutter. DD:  01/03/00 TD:  01/04/00 Job: [id number]

## 2010-07-15 NOTE — Assessment & Plan Note (Signed)
[facility]                         ELECTROPHYSIOLOGY OFFICE NOTE   NAME:Henault, BERDENE ASKARI                      MRN:          [id number]  DATE:04/09/2006                            DOB:          01-Aug-1945    [name] returns today for followup.  She is a very pleasant, middle-  aged woman with a history of atrial fibrillation and flutter status post  multiple ablations who has maintained a sinus rhythm very nicely on  Toprol over the last several years.  I saw her most recently back in  August 2006.  She returns today for followup.  She denies chest pain or  shortness of breath.  She has had no palpitations and, overall, feels  well.  She has had her Coumadin levels checked.  She had a  subtherapeutic INR back in January but, most recently, it was 23.  She  denies palpitations, syncope, or other symptoms.   EXAM:  She is a pleasant, well-appearing, middle-aged woman in no acute  distress.  The blood pressure today was 122/74.  The pulse 60 and regular.  The  respirations are 18 and the weight was 154 pounds.  NECK:  No jugular venous distention.  LUNGS:  Clear bilaterally to auscultation.  There were no wheezes,  rales, or rhonchi.  CARDIOVASCULAR:  Regular bradycardia with normal S1 and S2.  EXTREMITIES:  No edema.   EKG demonstrates sinus bradycardia with a poor R-wave progression.   Her medication include:  1. Coumadin.  2. Multivitamin.  3. Toprol 200 daily.  4. Os-Cal.   IMPRESSION:  1. Paroxysmal atrial arrhythmia status post multiple ablations times      2.  2. Chronic Coumadin therapy.  3. Hypertension, now well-controlled.  4. History of thromboembolic events in the past.   DISCUSSION:  Overall, Ms. Borrayo is stable and she continues to do well  on Toprol and Coumadin.  I plan that she continue on her present medical  regimen.  We will plan to see her back in 1-2 years.     Doylene Canning. Ladona Ridgel, MD  Electronically Signed    GWT/MedQ  DD: 04/09/2006  DT: 04/09/2006  Job #: [id number]

## 2010-07-15 NOTE — Discharge Summary (Signed)
[place]. [facility]  Patient:    Kendra [name], Kendra [name]                      MRN: [id number] Adm. Date:  [id number] Disc. Date: 02/27/00 Attending:  Lewayne Bunting Dictator:   Chinita Pester, C.R.N.P.                           Discharge Summary  PRIMARY DIAGNOSIS:  Atrial fibrillation, atrial flutter.  HISTORY OF PRESENT ILLNESS:  This is a 65 year old female with a history of atrial flutter since 1994 which has been increasing for a week.  She had a DC cardioversion on January 03, 2000, initially successful; then the atrial flutter returned.  She was evaluated at [facility] by Dr. Delena Serve on February 13, 2000, and ablation with mapping for this spring was scheduled.  The patient has a history of chronic atrial flutter, left renal emboli, and TIAs.  ALLERGIES:  Intolerance to AMIODARONE.  The patient was admitted to be placed on Tikosyn.  She started Tikosyn 500 mg q.12h.  She tolerated this dosage.  A QTC upon discharge was 450.  She had no complaints and was scheduled to follow up with an EKG in the office at [facility] on March 05, 2000, at 8:30 in the morning and to follow up with Dr. Ladona Ridgel in four weeks on March 27, 2000, at 10 a.m.  DISCHARGE MEDICATIONS: 1. Toprol XL 50 daily. 2. Coumadin 5 mg nightly except Wednesday 2.5 mg, and she was instructed to    take 2.5 mg tonight. 3. Tikosyn was 500 mg at 6 a.m. and 6 p.m.  DIET:  Low fat, low cholesterol.  She was to have a PT/INR on February 29, 2000.  An INR on December 30 was 2.9. DD:  02/27/00 TD:  02/27/00 Job: [id number]

## 2010-09-02 ENCOUNTER — Encounter: Payer: Self-pay | Admitting: Internal Medicine

## 2010-10-12 ENCOUNTER — Encounter: Payer: Self-pay | Admitting: Internal Medicine

## 2010-11-09 ENCOUNTER — Other Ambulatory Visit: Payer: Self-pay | Admitting: Internal Medicine

## 2010-12-15 ENCOUNTER — Encounter: Payer: Self-pay | Admitting: Internal Medicine

## 2010-12-19 ENCOUNTER — Ambulatory Visit: Payer: Self-pay | Admitting: Internal Medicine

## 2011-01-10 ENCOUNTER — Other Ambulatory Visit: Payer: Self-pay

## 2011-01-10 MED ORDER — METOPROLOL SUCCINATE ER 200 MG PO TB24: 200.0000 mg | ORAL_TABLET | Freq: Every day | ORAL | Status: DC

## 2011-01-26 ENCOUNTER — Ambulatory Visit (INDEPENDENT_AMBULATORY_CARE_PROVIDER_SITE_OTHER): Payer: BC Managed Care – PPO | Admitting: Internal Medicine

## 2011-01-26 ENCOUNTER — Encounter: Payer: Self-pay | Admitting: Internal Medicine

## 2011-01-26 VITALS — BP 120/62 | HR 61 | Ht 68.0 in | Wt 154.0 lb

## 2011-01-26 DIAGNOSIS — G4700 Insomnia, unspecified: Secondary | ICD-10-CM | POA: Insufficient documentation

## 2011-01-26 DIAGNOSIS — E7800 Pure hypercholesterolemia, unspecified: Secondary | ICD-10-CM

## 2011-01-26 DIAGNOSIS — I4891 Unspecified atrial fibrillation: Secondary | ICD-10-CM

## 2011-01-26 NOTE — Progress Notes (Signed)
HPI Kendra [name] returns today for followup. She is a pleasant 65 year old woman with a history of recurrent thromboembolism. She has atrial fibrillation and flutter and has undergone several ablations in the past. Over the last several years, she has maintained sinus rhythm on high-dose beta blocker therapy. She remains on systemic anticoagulation. Her Coumadin was stopped and she was placed on Pradaxa. She has tolerated this well. Her main complaint today is a problem with insomnia. She has had insomnia for many years. She is concerned that her beta blocker is the cause. On the other hand she is concerned that she stops her beta blocker, she will have more atrial arrhythmia. Allergies  Allergen Reactions  . Amiodarone Hcl     REACTION: Rash, nausea     Current Outpatient Prescriptions  Medication Sig Dispense Refill  . calcium-vitamin D (OSCAL WITH D) 500-200 MG-UNIT per tablet Take 1 tablet by mouth 2 (two) times daily.        . metoprolol (TOPROL-XL) 200 MG 24 hr tablet Take 1 tablet (200 mg total) by mouth daily.  30 tablet  1  . Multiple Vitamin (MULTIVITAMIN) tablet Take 1 tablet by mouth daily.        . Omega-3 Fatty Acids (FISH OIL) 1000 MG CAPS Take 1 capsule by mouth daily.        Marland Kitchen PRADAXA 150 MG CAPS TAKE ONE CAPSULE BY MOUTH TWICE DAILY  60 capsule  3     Past Medical History  Diagnosis Date  . Atrial flutter   . Atrial fibrillation     chronic anticoag  . Arthus phenomenon   . Hypercholesterolemia     Pure  . GERD (gastroesophageal reflux disease)   . Kidney infarction   . TIA (transient ischemic attack)     as per 07/30/00 note from [facility]     ROS:   All systems reviewed and negative except as noted in the HPI.   Past Surgical History  Procedure Date  . Breast biopsy   . Appendectomy   . Fibroid tumor removal 1996  . Cardiac electrophysiology study and ablation      Family History  Problem Relation Age of Onset  . Alcohol abuse Father   . Lung cancer  Father   . Stroke    . Other Brother     Emotional Illness     History   Social History  . Marital Status: Married    Spouse Name: N/A    Number of Children: N/A  . Years of Education: N/A   Occupational History  . 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 Smoking status: Never Smoker   . Smokeless tobacco: Not on file  . Alcohol Use: No  . Drug Use: No  . Sexually Active: Not on file   Other Topics Concern  . Not on file   Social History Narrative   Lives with husband     BP 120/62  Pulse 61  Ht 5\' 8"  (1.727 m)  Wt 69.854 kg (154 lb)  BMI 23.42 kg/m2  Physical Exam:  Well appearing middle-aged woman,NAD HEENT: Unremarkable Neck:  No JVD, no thyromegally Lymphatics:  No adenopathy Back:  No CVA tenderness Lungs:  Clear with no wheezes, rales, or rhonchi. HEART:  Regular rate rhythm, no murmurs, no rubs, no clicks Abd:  soft, positive bowel sounds, no organomegally, no rebound, no guarding Ext:  2 plus pulses, no edema, no cyanosis, no clubbing Skin:  No rashes  no nodules Neuro:  CN II through XII intact, motor grossly intact  EKG Normal sinus rhythm with rightward axis.   Assess/Plan:

## 2011-01-26 NOTE — Assessment & Plan Note (Signed)
She will continue her current medical therapy and maintain a low-fat diet. I have encouraged her to walk a regular basis.

## 2011-01-26 NOTE — Patient Instructions (Signed)
Your physician recommends that you schedule a follow-up appointment in: 4 weeks for a nurse room visit ---needs EKG and BP check  Your physician has recommended you make the following change in your medication:  1) Decrease Metoprolol to 1 tablet on M/W/F and 1/2 tablet  Tues/Th/Sat/Sun for 2 weeks then decrease to 1/2 tablet daily for 2 weeks

## 2011-01-26 NOTE — Assessment & Plan Note (Signed)
Her atrial arrhythmias appear to be well-controlled. Because of the problem of insomnia, and because her beta blocker has been known to cause insomnia, we will plan to slowly wean her off of her beta blocker. We'll have her come back in a month for a nurse visit to check an EKG and blood pressure.

## 2011-01-26 NOTE — Assessment & Plan Note (Signed)
I am not convinced the patient's insomnia is related to beta blockers. However the time course of her insomnia and her use of beta blockers roughly correlates. For this reason, we will wean her down from 200 mg daily of Toprol to 100 mg daily. Additional weaning off her beta blocker will occur based on whether or not her insomnia improved.

## 2011-02-08 ENCOUNTER — Other Ambulatory Visit: Payer: Self-pay

## 2011-02-08 MED ORDER — DABIGATRAN ETEXILATE MESYLATE 150 MG PO CAPS: 150.0000 mg | ORAL_CAPSULE | Freq: Every day | ORAL | Status: DC

## 2011-02-22 ENCOUNTER — Ambulatory Visit (INDEPENDENT_AMBULATORY_CARE_PROVIDER_SITE_OTHER): Payer: BC Managed Care – PPO

## 2011-02-22 VITALS — BP 116/68 | HR 70 | Ht 68.0 in | Wt 153.2 lb

## 2011-02-22 DIAGNOSIS — I4891 Unspecified atrial fibrillation: Secondary | ICD-10-CM

## 2011-02-22 MED ORDER — DABIGATRAN ETEXILATE MESYLATE 150 MG PO CAPS: 150.0000 mg | ORAL_CAPSULE | Freq: Two times a day (BID) | ORAL | Status: DC

## 2011-02-22 NOTE — Progress Notes (Signed)
Patient came to office for EKG,blood pressure check, was done.

## 2011-03-13 ENCOUNTER — Encounter: Payer: Self-pay | Admitting: Internal Medicine

## 2011-04-11 ENCOUNTER — Ambulatory Visit (INDEPENDENT_AMBULATORY_CARE_PROVIDER_SITE_OTHER): Payer: BC Managed Care – PPO | Admitting: Internal Medicine

## 2011-04-11 ENCOUNTER — Encounter: Payer: Self-pay | Admitting: Internal Medicine

## 2011-04-11 VITALS — BP 120/64 | HR 60 | Ht 68.0 in | Wt 154.0 lb

## 2011-04-11 DIAGNOSIS — K219 Gastro-esophageal reflux disease without esophagitis: Secondary | ICD-10-CM

## 2011-04-11 DIAGNOSIS — R079 Chest pain, unspecified: Secondary | ICD-10-CM

## 2011-04-11 MED ORDER — OMEPRAZOLE 20 MG PO CPDR: 20.0000 mg | DELAYED_RELEASE_CAPSULE | Freq: Every evening | ORAL | Status: DC

## 2011-04-11 NOTE — Patient Instructions (Addendum)
We have sent the following medications to your pharmacy for you to pick up at your convenience: Prilosec  Dr Felicity Coyer, Dr Ladona Ridgel

## 2011-04-11 NOTE — Progress Notes (Signed)
Kendra [name] [birth year] MRN [id number]   History of Present Illness:  This is a 66 year old white female with a burning sensation in the midsternum which occurs usually within an hour of eating supper. She has had it since Spring of 2012. It occurs at least once or twice a week. She has occasional difficulty swallowing and feeling of a lump in her throat. She has taken occasional TUMS or Rolaids but has not taken any acid reducers. We have seen her in the past for colorectal screening. She had a virtual colonoscopy in December 2004 which was entirely normal. A flexible sigmoidoscopy in January 1998 was normal. A CT scan of the abdomen in February 2010  showed a redundant colon at the hepatic flexure with the right renal infarct. She was on Coumadin and most recently on Pradaxa for chronic atrial fibrillation followed by Dr. Ladona Ridgel.   Past Medical History  Diagnosis Date  . Atrial flutter   . Atrial fibrillation     chronic anticoag  . Arthus phenomenon   . Hypercholesterolemia     Pure  . GERD (gastroesophageal reflux disease)   . Kidney infarction   . TIA (transient ischemic attack)     as per 07/30/00 note from [facility]   . Hx of blood clots    Past Surgical History  Procedure Date  . Breast biopsy   . Appendectomy   . Fibroid tumor removal 1996  . Cardiac electrophysiology study and ablation     reports that she has never smoked. She has never used smokeless tobacco. She reports that she does not drink alcohol or use illicit drugs. family history includes Alcohol abuse in her father; Lung cancer in her father; and Other in her brother.  There is no history of Colon cancer. Allergies  Allergen Reactions  . Amiodarone Hcl     REACTION: Rash, nausea        Review of Systems: Denies shortness of breath rectal bleeding abdominal pain  The remainder of the 10 point ROS is negative except as outlined in H&P   Physical Exam: General appearance  Well developed, in no  distress. Eyes- non icteric. HEENT nontraumatic, normocephalic. Mouth no lesions, tongue papillated, no cheilosis. Neck supple without adenopathy, thyroid not enlarged, no carotid bruits, no JVD. Lungs Clear to auscultation bilaterally. Cor normal S1, normal S2, regular rhythm, no murmur,  quiet precordium. Abdomen: Soft nontender abdomen with normal active bowel sounds. Liver edge at costal margin. No bruit. Rectal: Not done Extremities no pedal edema. Skin no lesions. Neurological alert and oriented x 3. Psychological normal mood and affect.  Assessment and Plan:  Problem #1 Patient's symptoms are consistent with gastroesophageal reflux which occurs postprandially. She has not been treated for it and therefore we will give her a trial of PPI, Prilosec 20 mg daily for a period of 4 weeks. If there is no improvement in the degree of dysphagia or odynophagia, we will proceed with an upper endoscopy.  Problem #2 Colorectal screening. She will be due for a repeat colonoscopy or virtual colonoscopy in December 2004. We will have to discuss if she needs to be taken off Pradaxa or if she can do the colonoscopy with propofol while on Pradaxa.   04/11/2011 Lina Sar

## 2011-05-22 ENCOUNTER — Telehealth: Payer: Self-pay | Admitting: Internal Medicine

## 2011-05-22 MED ORDER — METOPROLOL SUCCINATE ER 100 MG PO TB24: 100.0000 mg | ORAL_TABLET | Freq: Every day | ORAL | Status: DC

## 2011-05-22 NOTE — Telephone Encounter (Signed)
Pt calling re needing a refill but needs to talk to nurse first because she changed her dosage

## 2011-05-22 NOTE — Telephone Encounter (Signed)
Rx sent 

## 2012-03-08 ENCOUNTER — Other Ambulatory Visit: Payer: Self-pay | Admitting: Internal Medicine

## 2012-04-03 ENCOUNTER — Ambulatory Visit (INDEPENDENT_AMBULATORY_CARE_PROVIDER_SITE_OTHER): Payer: BC Managed Care – PPO | Admitting: Internal Medicine

## 2012-04-03 ENCOUNTER — Encounter: Payer: Self-pay | Admitting: Internal Medicine

## 2012-04-03 VITALS — BP 136/74 | HR 67 | Ht 67.0 in | Wt 152.1 lb

## 2012-04-03 DIAGNOSIS — G4700 Insomnia, unspecified: Secondary | ICD-10-CM

## 2012-04-03 DIAGNOSIS — I10 Essential (primary) hypertension: Secondary | ICD-10-CM

## 2012-04-03 DIAGNOSIS — I4891 Unspecified atrial fibrillation: Secondary | ICD-10-CM

## 2012-04-03 MED ORDER — METOPROLOL SUCCINATE ER 100 MG PO TB24: 100.0000 mg | ORAL_TABLET | Freq: Every day | ORAL | Status: DC

## 2012-04-03 MED ORDER — DABIGATRAN ETEXILATE MESYLATE 150 MG PO CAPS: 150.0000 mg | ORAL_CAPSULE | Freq: Two times a day (BID) | ORAL | Status: DC

## 2012-04-03 NOTE — Assessment & Plan Note (Signed)
She is status post ablation and is maintaining sinus rhythm very nicely. No change in medical therapy.

## 2012-04-03 NOTE — Assessment & Plan Note (Signed)
We discussed the maintenance of sleep hygiene. She asked about other medications including melatonin. I recommended that she try these as she deemed necessary.

## 2012-04-03 NOTE — Assessment & Plan Note (Signed)
Her blood pressure is well controlled. She will continue her beta blocker therapy. A low-sodium diet is recommended as his exercise.

## 2012-04-03 NOTE — Progress Notes (Signed)
HPI Kendra [name] returns today for followup. She is a very pleasant 67 year old woman with a history of paroxysmal atrial fibrillation , hypertension, and recurrent embolic phenomena. She has done well on systemic anticoagulation and beta blocker therapy. The patient denies chest pain or shortness of breath. Minimal peripheral edema. She is bothered by insomnia. This is been a chronic problem for many years. Allergies  Allergen Reactions  . Amiodarone Hcl     REACTION: Rash, nausea     Current Outpatient Prescriptions  Medication Sig Dispense Refill  . Cholecalciferol (VITAMIN D3) 1000 UNITS CAPS Take by mouth 3 (three) times a week.      . dabigatran (PRADAXA) 150 MG CAPS Take 1 capsule (150 mg total) by mouth 2 (two) times daily.  180 capsule  6  . metoprolol succinate (TOPROL XL) 100 MG 24 hr tablet Take 1 tablet (100 mg total) by mouth daily. Take with or immediately following a meal.  90 tablet  6  . Multiple Vitamin (MULTIVITAMIN) tablet Take 1 tablet by mouth daily.        . Omega-3 Fatty Acids (FISH OIL) 1000 MG CAPS Take 1 capsule by mouth daily.        Marland Kitchen PROCTOSOL HC 2.5 % rectal cream          Past Medical History  Diagnosis Date  . Atrial flutter   . Atrial fibrillation     chronic anticoag  . Arthus phenomenon   . Hypercholesterolemia     Pure  . GERD (gastroesophageal reflux disease)   . Kidney infarction   . TIA (transient ischemic attack)     as per 07/30/00 note from [facility]   . Hx of blood clots     ROS:   All systems reviewed and negative except as noted in the HPI.   Past Surgical History  Procedure Date  . Breast biopsy   . Appendectomy   . Fibroid tumor removal 1996  . Cardiac electrophysiology study and ablation      Family History  Problem Relation Age of Onset  . Alcohol abuse Father   . Lung cancer Father   . Colon cancer Neg Hx   . Other Brother     Emotional Illness     History   Social History  . Marital Status: Married    Spouse  Name: N/A    Number of Children: 4  . Years of Education: N/A   Occupational History  . 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 Smoking status: Never Smoker   . Smokeless tobacco: Never Used  . Alcohol Use: No  . Drug Use: No  . Sexually Active: Not on file   Other Topics Concern  . Not on file   Social History Narrative   Lives with husbandNo caffeine drinks      BP 136/74  Pulse 67  Ht 5\' 7"  (1.702 m)  Wt 152 lb 1.9 oz (69.001 kg)  BMI 23.83 kg/m2  Physical Exam:  Well appearing 67 year old woman, NAD HEENT: Unremarkable Neck:  No JVD, no thyromegaly Back:  No CVA tenderness Lungs:  Clearwith no wheezes, rales, or rhonchi. HEART:  Regular rate rhythm, no murmurs, no rubs, no clicks Abd:  soft, positive bowel sounds, no organomegally, no rebound, no guarding Ext:  2 plus pulses, no edema, no cyanosis, no clubbing Skin:  No rashes no nodules Neuro:  CN II through XII intact, motor grossly intact  EKG normal sinus rhythm with  poor R-wave progression.    Assess/Plan:

## 2012-11-06 ENCOUNTER — Telehealth: Payer: Self-pay | Admitting: *Deleted

## 2012-11-06 NOTE — Telephone Encounter (Signed)
Patient called [phone].  She saw Dr. Cyndie Chime about 4 yrs ago (epic note 05/13/08).  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  She is due to have cataract surgery and a colonoscopy in the next few months and is wondering what to do. [facility] note printed below for Dr. Patsy Lager review.

## 2013-01-02 ENCOUNTER — Other Ambulatory Visit: Payer: Self-pay | Admitting: Dermatology

## 2013-02-03 ENCOUNTER — Encounter: Payer: Self-pay | Admitting: *Deleted

## 2013-02-18 ENCOUNTER — Encounter: Payer: Self-pay | Admitting: Internal Medicine

## 2013-02-18 ENCOUNTER — Ambulatory Visit (INDEPENDENT_AMBULATORY_CARE_PROVIDER_SITE_OTHER): Payer: BC Managed Care – PPO | Admitting: Internal Medicine

## 2013-02-18 VITALS — BP 134/68 | HR 76 | Ht 67.5 in | Wt 152.2 lb

## 2013-02-18 DIAGNOSIS — Z1211 Encounter for screening for malignant neoplasm of colon: Secondary | ICD-10-CM

## 2013-02-18 DIAGNOSIS — D689 Coagulation defect, unspecified: Secondary | ICD-10-CM

## 2013-02-18 MED ORDER — MOVIPREP 100 G PO SOLR: 1.0000 | Freq: Once | ORAL | Status: DC

## 2013-02-18 NOTE — Progress Notes (Signed)
Kendra [name] [facility] [birth year] [id number]   History of Present Illness:  This is a 67 year old white female who is here to discuss having a colonoscopy. She had a virtual colonoscopy in December 2004 because she was on Coumadin. Patient has been followed by her cardiologist, Dr. Ladona Ridgel for atrial fibrillation and also for hypercoagulable state (by Dr Cyndie Chime). She remains on lifelong anti-coagulation and is currently on Pradaxa. She denies any GI symptoms. She just walked a 1/2 marathon and feels great. There is no family history of colon cancer. Her previous virtual colonoscopy was normal. A prior flexible sigmoidoscopy in 1998 was also normal. A CT scan of the abdomen in February 2010 showed a redundant colon in the hepatic flexure in the right renal infarct. There is a history of TIA.   Past Medical History  Diagnosis Date  . Atrial flutter   . Atrial fibrillation     chronic anticoag  . Arthus phenomenon   . Hypercholesterolemia     Pure  . GERD (gastroesophageal reflux disease)   . Kidney infarction   . TIA (transient ischemic attack)     as per 07/30/00 note from [facility]   . Hx of blood clots   . Hemorrhoids     Past Surgical History  Procedure Laterality Date  . Breast biopsy Right   . Appendectomy    . Fibroid tumor removal  1996  . Cardiac electrophysiology study and ablation      Allergies  Allergen Reactions  . Amiodarone Hcl     REACTION: Rash, nausea    Family history and social history have been reviewed.  Review of Systems: Negative for constipation, diarrhea or abdominal pain  The remainder of the 10 point ROS is negative except as outlined in the H&P  Physical Exam: General Appearance Well developed, in no distress Psychological Normal mood and affect  Assessment and Plan:   Problem #60 67 year old white female who is the due for recall colonoscopy. She is anticoagulated and is at high risk for embolic event. She needs to remain anticoagulated because of  her history of TIA and renal infarct. We discussed repeating a virtual colonoscopy versus traditional colonoscopy while on Pradaxa We discussed advantages and disadvantages of both procedures. She elected to go with a colonoscopy while on Pradaxa. If she had a virtual colonoscopy and a polyp was found, she would still need a colonoscopy to remove it. The virtual colonoscopy could miss polyps less than 1 cm. We have discussed sedation as well as prep and the procedure. We will schedule a colonoscopy in Jan or Feb 2015    Lina Sar 02/18/2013

## 2013-02-18 NOTE — Patient Instructions (Signed)
You have been scheduled for a colonoscopy with propofol. Please follow written instructions given to you at your visit today.  Please pick up your prep kit at the pharmacy within the next 1-3 days. If you use inhalers (even only as needed), please bring them with you on the day of your procedure. Your physician has requested that you go to www.startemmi.com and enter the access code given to you at your visit today. This web site gives a general overview about your procedure. However, you should still follow specific instructions given to you by our office regarding your preparation for the procedure.  Please continue Pradaxa for your procedure.  CC: Dr Ladona Ridgel, Dr Charlyn Minerva

## 2013-03-07 ENCOUNTER — Ambulatory Visit (AMBULATORY_SURGERY_CENTER): Payer: BC Managed Care – PPO | Admitting: Internal Medicine

## 2013-03-07 ENCOUNTER — Encounter: Payer: Self-pay | Admitting: Internal Medicine

## 2013-03-07 VITALS — BP 132/66 | HR 65 | Temp 96.4°F | Resp 12 | Ht 67.5 in | Wt 152.0 lb

## 2013-03-07 DIAGNOSIS — Z1211 Encounter for screening for malignant neoplasm of colon: Secondary | ICD-10-CM

## 2013-03-07 MED ORDER — SODIUM CHLORIDE 0.9 % IV SOLN: 500.0000 mL | INTRAVENOUS | Status: DC

## 2013-03-07 NOTE — Op Note (Signed)
[place]  [address] [place], [address]   COLONOSCOPY PROCEDURE REPORT  PATIENT: Kendra [name], Kendra [name]  MR#: [id number] BIRTHDATE: 19-Oct-1945 , 67  yrs. old GENDER: Female ENDOSCOPIST: Lafayette Dragon, MD REFERRED BY:Dr Talbert Forest PROCEDURE DATE:  03/07/2013 PROCEDURE:   Colonoscopy, screening First Screening Colonoscopy - Avg.  risk and is 50 yrs.  old or older - No.  Prior Negative Screening - Now for repeat screening. 10 or more years since last screening  History of Adenoma - Now for follow-up colonoscopy & has been > or = to 3 yrs.  N/A  Polyps Removed Today? No.  Recommend repeat exam, <10 yrs? No. ASA CLASS:   Class III INDICATIONS:Average risk patient for colon cancer and virtual colonoscopy December 2004 while the patient was on Coumadin ,she is currently on Pradaxa. MEDICATIONS: MAC sedation, administered by CRNA and propofol (Diprivan) 250mg  IV  DESCRIPTION OF PROCEDURE:   After the risks benefits and alternatives of the procedure were thoroughly explained, informed consent was obtained.  A digital rectal exam revealed no abnormalities of the rectum.   The LB [id number] and LB [id number]  endoscope was introduced through the anus and advanced to the cecum, which was identified by both the appendix and ileocecal valve. No adverse events experienced.   The quality of the prep was excellent, using MoviPrep  The instrument was then slowly withdrawn as the colon was fully examined.      COLON FINDINGS: Small internal hemorrhoids were found.  Retroflexed views revealed no abnormalities. The time to cecum=7.  minutes 51 seconds.  Withdrawal time=7 minutes 37 seconds.  The scope was withdrawn and the procedure completed. COMPLICATIONS: There were no complications.  ENDOSCOPIC IMPRESSION: Small internal hemorrhoids  RECOMMENDATIONS: high-fiber diet Continue Pradaxa Recall colonoscopy 10 years   eSigned:  Lafayette Dragon, MD  03/07/2013 8:32 AM   cc:

## 2013-03-07 NOTE — Progress Notes (Signed)
Pt. States that Dr. Olevia Perches advised her that she did not need to discontinue pradaxa prior to procedure.

## 2013-03-07 NOTE — Patient Instructions (Signed)
Discharge instructions given with verbal understanding. Handouts on hemorrhoids and a high fiber diet. Resume previous medications. YOU HAD AN ENDOSCOPIC PROCEDURE TODAY AT THE [facility]: Refer to the procedure report that was given to you for any specific questions about what was found during the examination.  If the procedure report does not answer your questions, please call your gastroenterologist to clarify.  If you requested that your care partner not be given the details of your procedure findings, then the procedure report has been included in a sealed envelope for you to review at your convenience later.  YOU SHOULD EXPECT: Some feelings of bloating in the abdomen. Passage of more gas than usual.  Walking can help get rid of the air that was put into your GI tract during the procedure and reduce the bloating. If you had a lower endoscopy (such as a colonoscopy or flexible sigmoidoscopy) you may notice spotting of blood in your stool or on the toilet paper. If you underwent a bowel prep for your procedure, then you may not have a normal bowel movement for a few days.  DIET: Your first meal following the procedure should be a light meal and then it is ok to progress to your normal diet.  A half-sandwich or bowl of soup is an example of a good first meal.  Heavy or fried foods are harder to digest and may make you feel nauseous or bloated.  Likewise meals heavy in dairy and vegetables can cause extra gas to form and this can also increase the bloating.  Drink plenty of fluids but you should avoid alcoholic beverages for 24 hours.  ACTIVITY: Your care partner should take you home directly after the procedure.  You should plan to take it easy, moving slowly for the rest of the day.  You can resume normal activity the day after the procedure however you should NOT DRIVE or use heavy machinery for 24 hours (because of the sedation medicines used during the test).    SYMPTOMS TO REPORT  IMMEDIATELY: A gastroenterologist can be reached at any hour.  During normal business hours, 8:30 AM to 5:00 PM Monday through Friday, call [phone].  After hours and on weekends, please call the GI answering service at [phone] who will take a message and have the physician on call contact you.   Following lower endoscopy (colonoscopy or flexible sigmoidoscopy):  Excessive amounts of blood in the stool  Significant tenderness or worsening of abdominal pains  Swelling of the abdomen that is new, acute  Fever of 100F or higher FOLLOW UP: If any biopsies were taken you will be contacted by phone or by letter within the next 1-3 weeks.  Call your gastroenterologist if you have not heard about the biopsies in 3 weeks.  Our staff will call the home number listed on your records the next business day following your procedure to check on you and address any questions or concerns that you may have at that time regarding the information given to you following your procedure. This is a courtesy call and so if there is no answer at the home number and we have not heard from you through the emergency physician on call, we will assume that you have returned to your regular daily activities without incident.  SIGNATURES/CONFIDENTIALITY: You and/or your care partner have signed paperwork which will be entered into your electronic medical record.  These signatures attest to the fact that that the information above on your After   Visit Summary has been reviewed and is understood.  Full responsibility of the confidentiality of this discharge information lies with you and/or your care-partner. 

## 2013-03-07 NOTE — Progress Notes (Signed)
Report to pacu rn, vss, bbs=clear 

## 2013-03-10 ENCOUNTER — Telehealth: Payer: Self-pay

## 2013-03-10 NOTE — Telephone Encounter (Signed)
  Follow up Call-  Call back number 03/07/2013  Post procedure Call Back phone  # [phone]  Permission to leave phone message Yes     Patient questions:  Do you have a fever, pain , or abdominal swelling? no Pain Score  0 *  Have you tolerated food without any problems? yes  Have you been able to return to your normal activities? yes  Do you have any questions about your discharge instructions: Diet   no Medications  no Follow up visit  no  Do you have questions or concerns about your Care? no  Actions: * If pain score is 4 or above: No action needed, pain <4.

## 2013-05-12 ENCOUNTER — Other Ambulatory Visit: Payer: Self-pay | Admitting: Internal Medicine

## 2013-05-22 ENCOUNTER — Telehealth: Payer: Self-pay | Admitting: Internal Medicine

## 2013-05-22 MED ORDER — DABIGATRAN ETEXILATE MESYLATE 150 MG PO CAPS: ORAL_CAPSULE | ORAL | Status: DC

## 2013-05-22 MED ORDER — METOPROLOL SUCCINATE ER 100 MG PO TB24: ORAL_TABLET | ORAL | Status: DC

## 2013-05-22 NOTE — Telephone Encounter (Signed)
Discussed  with Dr Lovena Le and she is correct.  He agrees with 2 year follow up  I have called her medications in with a years refill

## 2013-05-22 NOTE — Telephone Encounter (Signed)
New message     Last office visit was in  2/14 . Patient stated Dr. Lovena Le told her to return in 2 years . However, on the bottle it's stated she need an appt before they refill any medication again.

## 2013-07-14 ENCOUNTER — Ambulatory Visit (INDEPENDENT_AMBULATORY_CARE_PROVIDER_SITE_OTHER): Payer: BC Managed Care – PPO | Admitting: Internal Medicine

## 2013-07-14 ENCOUNTER — Encounter: Payer: Self-pay | Admitting: Internal Medicine

## 2013-07-14 ENCOUNTER — Encounter (INDEPENDENT_AMBULATORY_CARE_PROVIDER_SITE_OTHER): Payer: Self-pay

## 2013-07-14 VITALS — BP 127/69 | HR 66 | Ht 67.0 in | Wt 155.0 lb

## 2013-07-14 DIAGNOSIS — I10 Essential (primary) hypertension: Secondary | ICD-10-CM

## 2013-07-14 DIAGNOSIS — I4891 Unspecified atrial fibrillation: Secondary | ICD-10-CM

## 2013-07-14 NOTE — Progress Notes (Signed)
HPI Kendra [name] returns today for followup. She is a very pleasant 68 year old woman with a history of paroxysmal atrial fibrillation , hypertension, and recurrent embolic phenomena. She has done well on systemic anticoagulation and beta blocker therapy. The patient denies chest pain or shortness of breath. Minimal peripheral edema. She is bothered by insomnia. This is been a chronic problem for many years. She is concerned today about her Pradaxa as she has an in-law who had a stroke on Pradaxa. She has had no symptoms and walks daily. Allergies  Allergen Reactions  . Amiodarone Hcl     REACTION: Rash, nausea     Current Outpatient Prescriptions  Medication Sig Dispense Refill  . dabigatran (PRADAXA) 150 MG CAPS capsule TAKE ONE CAPSULE BY MOUTH TWICE DAILY  180 capsule  3  . metoprolol succinate (TOPROL-XL) 100 MG 24 hr tablet TAKE ONE TABLET BY MOUTH EVERY DAY,TAKE WITH MEAL OR IMMEDIATELY FOLLOWING A MEAL  90 tablet  3  . Multiple Vitamin (MULTIVITAMIN) tablet Take 1 tablet by mouth daily.        . Omega-3 Fatty Acids (FISH OIL) 1000 MG CAPS Take 1 capsule by mouth daily.         No current facility-administered medications for this visit.     Past Medical History  Diagnosis Date  . Atrial flutter   . Atrial fibrillation     chronic anticoag  . Arthus phenomenon   . Hypercholesterolemia     Pure  . GERD (gastroesophageal reflux disease)   . Kidney infarction   . TIA (transient ischemic attack)     as per 07/30/00 note from Kendra [name]   . Hx of blood clots   . Hemorrhoids     ROS:   All systems reviewed and negative except as noted in the HPI.   Past Surgical History  Procedure Laterality Date  . Breast biopsy Right   . Appendectomy    . Fibroid tumor removal  1996  . Cardiac electrophysiology study and ablation       Family History  Problem Relation Age of Onset  . Alcohol abuse Father   . Lung cancer Father   . Colon cancer Neg Hx   . Other Brother     Emotional  Illness     History   Social History  . Marital Status: Married    Spouse Name: N/A    Number of Children: 4  . Years of Education: N/A   Occupational History  . 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 Smoking status: Never Smoker   . Smokeless tobacco: Never Used  . Alcohol Use: No  . Drug Use: No  . Sexual Activity: Not on file   Other Topics Concern  . Not on file   Social History Narrative   Lives with husband   No caffeine drinks      BP 127/69  Pulse 66  Ht 5\' 7"  (1.702 m)  Wt 155 lb (70.308 kg)  BMI 24.27 kg/m2  Physical Exam:  Well appearing 68 year old woman, NAD HEENT: Unremarkable Neck:  No JVD, no thyromegaly Back:  No CVA tenderness Lungs:  Clearwith no wheezes, rales, or rhonchi. HEART:  Regular rate rhythm, no murmurs, no rubs, no clicks Abd:  soft, positive bowel sounds, no organomegally, no rebound, no guarding Ext:  2 plus pulses, no edema, no cyanosis, no clubbing Skin:  No rashes no nodules Neuro:  CN II through XII intact, motor grossly intact  EKG normal sinus rhythm with poor R-wave progression.    Assess/Plan:

## 2013-07-14 NOTE — Assessment & Plan Note (Signed)
She appears to be maintaining NSR very nicely. She will continue her current meds.

## 2013-07-14 NOTE — Assessment & Plan Note (Signed)
Her blood pressure has been well controlled on metoprolol. She will continue.

## 2013-07-14 NOTE — Patient Instructions (Signed)
Your physician wants you to follow-up in: 12 months with Dr. Taylor. You will receive a reminder letter in the mail two months in advance. If you don't receive a letter, please call our office to schedule the follow-up appointment.    

## 2014-04-28 ENCOUNTER — Other Ambulatory Visit (INDEPENDENT_AMBULATORY_CARE_PROVIDER_SITE_OTHER): Payer: 59

## 2014-04-28 ENCOUNTER — Encounter: Payer: Self-pay | Admitting: Internal Medicine

## 2014-04-28 ENCOUNTER — Ambulatory Visit (INDEPENDENT_AMBULATORY_CARE_PROVIDER_SITE_OTHER): Payer: 59 | Admitting: Internal Medicine

## 2014-04-28 VITALS — BP 116/64 | HR 72 | Ht 67.5 in | Wt 153.0 lb

## 2014-04-28 DIAGNOSIS — R109 Unspecified abdominal pain: Secondary | ICD-10-CM

## 2014-04-28 DIAGNOSIS — M940 Chondrocostal junction syndrome [Tietze]: Secondary | ICD-10-CM

## 2014-04-28 LAB — URINALYSIS, ROUTINE W REFLEX MICROSCOPIC
Nitrite: NEGATIVE
Specific Gravity, Urine: 1.025 (ref 1.000–1.030)
Total Protein, Urine: NEGATIVE
URINE GLUCOSE: NEGATIVE
Urobilinogen, UA: 0.2 (ref 0.0–1.0)
pH: 5.5 (ref 5.0–8.0)

## 2014-04-28 MED ORDER — DICLOFENAC SODIUM 75 MG PO TBEC: 75.0000 mg | DELAYED_RELEASE_TABLET | Freq: Every day | ORAL | Status: DC

## 2014-04-28 MED ORDER — METHYLPREDNISOLONE (PAK) 4 MG PO TABS: ORAL_TABLET | ORAL | Status: DC

## 2014-04-28 NOTE — Patient Instructions (Addendum)
We have sent  medications to your pharmacy for you to pick up at your convenience  Your physician has requested that you go to the basement for the following lab work before leaving today: U/A  Please call our office in 2 weeks to update [place] on you condition Dr Jerilynn [email or web address]

## 2014-04-28 NOTE — Progress Notes (Signed)
Kendra [name] JOACZYS 09-03-1945 [id number]  Note: This dictation was prepared with Dragon digital system. Any transcriptional errors that result from this procedure are unintentional.   History of Present Illness: This is a 69 year old white female with 2 months history of sharp pain along left costal margin  which initially started rather suddenly in the left flank and subsequently extended around the rib cage anteriorly. The pain has been continuously present since its onset.. It is worse when she sits at the computer but is relieved within 30 seconds to a minute after she stands up and walks around. It is also relieved by  laying down. It is not associated with food or with a bowel movements. She is very active walker and the walking seem to relieve the pain. At times she has to reposition her herself at night when she lays on the side. It is worse when she coughs. She had a upper respiratory infection last week and coughing exacerbated the pain. She denies shortness of breath. 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 She was on Coumadin since 2004 and then switched to Pradaxa which she is on now, followed by Dr. Lovena Le. Last appointment was  in May 2015. I have seen her in May 2013 for epigastric discomfort and dysphagia which was relieved by using Prilosec and antacids. She had a virtual colonoscopy in 2004 while on Coumadin , the exam was normal and she had at all screening colonoscopy in January 2015 which showed small internal hemorrhoids otherwise normal exam. CT scan of the abdomen in 2010 showed redundant colon in the hepatic flexure in addition to the multifocal infarcts. Her chest x-ray last week was normal    Past Medical History  Diagnosis Date  . Atrial flutter   . Atrial fibrillation     chronic anticoag  . Arthus phenomenon   . Hypercholesterolemia     Pure  . GERD (gastroesophageal reflux disease)   . Kidney infarction   . TIA (transient ischemic attack)     as per 07/30/00 note from [facility]   . Hx of blood clots   . Hemorrhoids     Past Surgical History  Procedure Laterality Date  . Breast biopsy Right   . Appendectomy    . Fibroid tumor removal  1996  . Cardiac electrophysiology study and ablation      Allergies  Allergen Reactions  . Amiodarone Hcl     REACTION: Rash, nausea    Family history and social history have been reviewed.  Review of Systems: Pain along left costal margin extending laterally into the back on daily basis. No change in bowel habits. No weight changes  The remainder of the 10 point ROS is negative except as outlined in the H&P  Physical Exam: General Appearance Well developed, in no distress Eyes  Non icteric  HEENT  Non traumatic, normocephalic  Mouth No lesion, tongue papillated, no cheilosis Neck Supple without adenopathy, thyroid not enlarged, no carotid bruits, no JVD Lungs Clear to auscultation bilaterally COR Normal S1, normal S2, regular rhythm, no murmur, quiet precordium Abdomen very tender last 3 or 4 ribs on the left side with the point tenderness when the lifting and putting pressure on the ribs. Sitting up and laying down does not seem to precipitate pain. There is no inspiratory rales or Robert in her lungs. There is mild left CVA  tenderness. Straight leg raising is negative. Still the abdominal exam is unremarkable. No epigastric tenderness no palpable mass Rectal not done Extremities  No pedal edema Skin No lesions Neurological Alert and oriented x 3 Psychological Normal mood and affect  Assessment and Plan:   69 year old white female with  point tenderness along the left costal margin involving the last 3 ribs. Extending laterally and slightly to the back. Consistent with musculoskeletal pain. Possible costochondritis. Possible neuralgia. It started rather suddenly without any  triggering activity. She has not tried any anti-inflammatory agents. There is no association with her GI tract. There is some increased symptoms on  inspiration or coughing which I think is related to the rib tenderness but  one could consider possibility of pulmonary emboli with her history of embolic disease. I have  discussed differential diagnosis from neuralgia to costochondritis to possible pulmonary emboli. She feels like she would like to try anti-inflammatory agents and change of position to relieve pressure on her ribs especially when she is reading a sitting. We will start diclofenac 75 mg daily and Medrol pack. She will update me in next 10-14 days and if symptoms don't improve, consider CT scan of the chest involving the lower rib cage. I would also consider in the future a  possibility of  nerve block along the  left costal margin. We will obtain urinalysis today to make sure she doesn't have a renal injury.    Delfin Edis 04/28/2014

## 2014-04-29 ENCOUNTER — Other Ambulatory Visit: Payer: Self-pay | Admitting: *Deleted

## 2014-04-29 DIAGNOSIS — R109 Unspecified abdominal pain: Secondary | ICD-10-CM

## 2014-04-30 ENCOUNTER — Other Ambulatory Visit (INDEPENDENT_AMBULATORY_CARE_PROVIDER_SITE_OTHER): Payer: 59

## 2014-04-30 DIAGNOSIS — R109 Unspecified abdominal pain: Secondary | ICD-10-CM

## 2014-04-30 LAB — URINALYSIS, ROUTINE W REFLEX MICROSCOPIC
Bilirubin Urine: NEGATIVE
Ketones, ur: NEGATIVE
LEUKOCYTES UA: NEGATIVE
NITRITE: NEGATIVE
RBC / HPF: NONE SEEN (ref 0–?)
Specific Gravity, Urine: <=1.005 — AB (ref 1.000–1.030)
Total Protein, Urine: NEGATIVE
URINE GLUCOSE: NEGATIVE
UROBILINOGEN UA: 0.2 (ref 0.0–1.0)
WBC, UA: NONE SEEN (ref 0–?)
pH: 6 (ref 5.0–8.0)

## 2014-05-02 LAB — URINE CULTURE: Colony Count: 2000

## 2014-05-11 ENCOUNTER — Encounter: Payer: Self-pay | Admitting: Internal Medicine

## 2014-05-25 ENCOUNTER — Encounter: Payer: Self-pay | Admitting: Internal Medicine

## 2014-05-26 ENCOUNTER — Ambulatory Visit: Payer: Self-pay | Admitting: Internal Medicine

## 2014-06-07 ENCOUNTER — Other Ambulatory Visit: Payer: Self-pay | Admitting: Internal Medicine

## 2014-07-23 ENCOUNTER — Encounter: Payer: Self-pay | Admitting: Internal Medicine

## 2014-07-23 ENCOUNTER — Ambulatory Visit (INDEPENDENT_AMBULATORY_CARE_PROVIDER_SITE_OTHER): Payer: 59 | Admitting: Internal Medicine

## 2014-07-23 VITALS — BP 112/70 | HR 73 | Ht 67.5 in | Wt 154.4 lb

## 2014-07-23 DIAGNOSIS — G4700 Insomnia, unspecified: Secondary | ICD-10-CM | POA: Diagnosis not present

## 2014-07-23 DIAGNOSIS — I480 Paroxysmal atrial fibrillation: Secondary | ICD-10-CM | POA: Diagnosis not present

## 2014-07-23 DIAGNOSIS — I10 Essential (primary) hypertension: Secondary | ICD-10-CM | POA: Diagnosis not present

## 2014-07-23 NOTE — Assessment & Plan Note (Signed)
SHe continue to be bothered. She has some good days/nights and some bad but is not interested in starting any meds.

## 2014-07-23 NOTE — Patient Instructions (Signed)

## 2014-07-23 NOTE — Progress Notes (Signed)
HPI Kendra [name] returns today for followup. She is a very pleasant 69 year old woman with a history of paroxysmal atrial fibrillation , hypertension, and recurrent embolic phenomena. She has done well on systemic anticoagulation and beta blocker therapy. The patient denies chest pain or shortness of breath. Minimal peripheral edema. She is bothered by insomnia. This is been a chronic problem for many years. She is concerned today about her Pradaxa as she has an in-law who had a stroke on Pradaxa. She has had no symptoms and walks daily. Allergies  Allergen Reactions  . Amiodarone Hcl     REACTION: Rash, nausea     Current Outpatient Prescriptions  Medication Sig Dispense Refill  . metoprolol succinate (TOPROL-XL) 100 MG 24 hr tablet TAKE ONE TABLET BY MOUTH ONCE DAILY WITH A MEAL OR IMMEDIATELY FOLLOWING A MEAL. 90 tablet 0  . Multiple Vitamin (MULTIVITAMIN) tablet Take 1 tablet by mouth daily.      . Omega-3 Fatty Acids (FISH OIL) 1000 MG CAPS Take 1 capsule by mouth daily.      Marland Kitchen PRADAXA 150 MG CAPS capsule TAKE ONE CAPSULE BY MOUTH TWICE DAILY 180 capsule 0   No current facility-administered medications for this visit.     Past Medical History  Diagnosis Date  . Atrial flutter   . Atrial fibrillation     chronic anticoag  . Arthus phenomenon   . Hypercholesterolemia     Pure  . GERD (gastroesophageal reflux disease)   . Kidney infarction   . TIA (transient ischemic attack)     as per 07/30/00 note from [place]   . Hx of blood clots   . Hemorrhoids     ROS:   All systems reviewed and negative except as noted in the HPI.   Past Surgical History  Procedure Laterality Date  . Breast biopsy Right   . Appendectomy    . Fibroid tumor removal  1996  . Cardiac electrophysiology study and ablation       Family History  Problem Relation Age of Onset  . Alcohol abuse Father   . Lung cancer Father   . Colon cancer Neg Hx   . Other Brother     Emotional Illness      History   Social History  . Marital Status: Married    Spouse Name: N/A  . Number of Children: 4  . Years of Education: N/A   Occupational History  . 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 Smoking status: Never Smoker   . Smokeless tobacco: Never Used  . Alcohol Use: No  . Drug Use: No  . Sexual Activity: Not on file   Other Topics Concern  . Not on file   Social History Narrative   Lives with husband   No caffeine drinks      BP 112/70 mmHg  Pulse 73  Ht 5' 7.5" (1.715 m)  Wt 154 lb 6.4 oz (70.035 kg)  BMI 23.81 kg/m2  Physical Exam:  Well appearing 69 year old woman, NAD HEENT: Unremarkable Neck:  No JVD, no thyromegaly Back:  No CVA tenderness Lungs:  Clearwith no wheezes, rales, or rhonchi. HEART:  Regular rate rhythm, no murmurs, no rubs, no clicks Abd:  soft, positive bowel sounds, no organomegally, no rebound, no guarding Ext:  2 plus pulses, no edema, no cyanosis, no clubbing Skin:  No rashes no nodules Neuro:  CN II through XII intact, motor grossly intact  EKG normal sinus rhythm with poor  R-wave progression.    Assess/Plan:

## 2014-07-23 NOTE — Assessment & Plan Note (Signed)
She is maintaining NSR very nicely. She will continue her current meds. 

## 2014-07-23 NOTE — Assessment & Plan Note (Signed)
Her blood pressure is well controlled. She will continue her current meds.  

## 2014-09-14 ENCOUNTER — Other Ambulatory Visit: Payer: Self-pay

## 2014-09-14 MED ORDER — METOPROLOL SUCCINATE ER 100 MG PO TB24: ORAL_TABLET | ORAL | Status: DC

## 2014-09-14 MED ORDER — DABIGATRAN ETEXILATE MESYLATE 150 MG PO CAPS: 150.0000 mg | ORAL_CAPSULE | Freq: Two times a day (BID) | ORAL | Status: DC

## 2014-09-16 ENCOUNTER — Other Ambulatory Visit: Payer: Self-pay

## 2014-09-16 MED ORDER — DABIGATRAN ETEXILATE MESYLATE 150 MG PO CAPS: 150.0000 mg | ORAL_CAPSULE | Freq: Two times a day (BID) | ORAL | Status: DC

## 2014-09-16 MED ORDER — METOPROLOL SUCCINATE ER 100 MG PO TB24: ORAL_TABLET | ORAL | Status: DC

## 2014-12-30 LAB — HM MAMMOGRAPHY: HM Mammogram: NORMAL

## 2015-01-28 DIAGNOSIS — I639 Cerebral infarction, unspecified: Secondary | ICD-10-CM

## 2015-01-28 HISTORY — DX: Cerebral infarction, unspecified: I63.9

## 2015-02-13 ENCOUNTER — Emergency Department (HOSPITAL_COMMUNITY): Payer: 59

## 2015-02-13 ENCOUNTER — Encounter (HOSPITAL_COMMUNITY): Payer: Self-pay | Admitting: Emergency Medicine

## 2015-02-13 ENCOUNTER — Emergency Department (HOSPITAL_COMMUNITY)
Admission: EM | Admit: 2015-02-13 | Discharge: 2015-02-13 | Disposition: A | Discharge status: Home / Self Care | Payer: 59 | Attending: Emergency Medicine | Admitting: Emergency Medicine

## 2015-02-13 DIAGNOSIS — Y998 Other external cause status: Secondary | ICD-10-CM | POA: Diagnosis not present

## 2015-02-13 DIAGNOSIS — Z79899 Other long term (current) drug therapy: Secondary | ICD-10-CM | POA: Diagnosis not present

## 2015-02-13 DIAGNOSIS — Z862 Personal history of diseases of the blood and blood-forming organs and certain disorders involving the immune mechanism: Secondary | ICD-10-CM | POA: Diagnosis not present

## 2015-02-13 DIAGNOSIS — Y9389 Activity, other specified: Secondary | ICD-10-CM | POA: Diagnosis not present

## 2015-02-13 DIAGNOSIS — R05 Cough: Secondary | ICD-10-CM | POA: Insufficient documentation

## 2015-02-13 DIAGNOSIS — W19XXXA Unspecified fall, initial encounter: Secondary | ICD-10-CM

## 2015-02-13 DIAGNOSIS — R059 Cough, unspecified: Secondary | ICD-10-CM

## 2015-02-13 DIAGNOSIS — Y9289 Other specified places as the place of occurrence of the external cause: Secondary | ICD-10-CM | POA: Diagnosis not present

## 2015-02-13 DIAGNOSIS — Z8679 Personal history of other diseases of the circulatory system: Secondary | ICD-10-CM | POA: Diagnosis not present

## 2015-02-13 DIAGNOSIS — S59902A Unspecified injury of left elbow, initial encounter: Secondary | ICD-10-CM | POA: Diagnosis present

## 2015-02-13 DIAGNOSIS — Z7902 Long term (current) use of antithrombotics/antiplatelets: Secondary | ICD-10-CM | POA: Diagnosis not present

## 2015-02-13 DIAGNOSIS — S50312A Abrasion of left elbow, initial encounter: Secondary | ICD-10-CM | POA: Insufficient documentation

## 2015-02-13 DIAGNOSIS — W109XXA Fall (on) (from) unspecified stairs and steps, initial encounter: Secondary | ICD-10-CM | POA: Insufficient documentation

## 2015-02-13 HISTORY — DX: Hereditary factor VIII deficiency: D66

## 2015-02-13 LAB — CBC
HCT: 42.3 % (ref 36.0–46.0)
HEMOGLOBIN: 15.2 g/dL — AB (ref 12.0–15.0)
MCH: 31.8 pg (ref 26.0–34.0)
MCHC: 35.9 g/dL (ref 30.0–36.0)
MCV: 88.5 fL (ref 78.0–100.0)
PLATELETS: 180 10*3/uL (ref 150–400)
RBC: 4.78 MIL/uL (ref 3.87–5.11)
RDW: 12.4 % (ref 11.5–15.5)
WBC: 7 10*3/uL (ref 4.0–10.5)

## 2015-02-13 MED ORDER — SODIUM CHLORIDE 0.9 % IV BOLUS (SEPSIS)
1000.0000 mL | Freq: Once | INTRAVENOUS | Status: AC
  Administered 2015-02-13: 1000 mL via INTRAVENOUS

## 2015-02-13 NOTE — ED Notes (Signed)
Patient transported to X-ray 

## 2015-02-13 NOTE — ED Notes (Signed)
Received pt from home via ems With c/o slip down 20 steps, pt denies hitting her head. BP [id number] systolic with EMS, upon standing pt BP decreased to [id number] systolic c/o nausea, became diaphoretic and pale. Pt has abrasion to left elbow.

## 2015-02-13 NOTE — ED Provider Notes (Signed)
CSN: [id number]     Arrival date & time 02/13/15  1224 History   First MD Initiated Contact with Patient 02/13/15 1232     Chief Complaint  Patient presents with  . Fall  . Abrasion     HPI Patient fell going up the stairs this afternoon.  She slipped and fell atop the stairs and fell down approximately 15-20 steps on her back.  She is not sure if she struck her head.  Husband was concerned because he felt as though she possibly lost consciousness.  She is on chronic anticoagulation for atrial flutter.  She denies neck pain.  She denies headache at this time.  She denies weakness of her arms or legs.  She reports minor injury to her left elbow but can fully range her left elbow without difficulty.  She denies thoracic or lumbar pain.  She reports no weakness or pain with range of motion of her bilateral knees or hips.   Past Medical History  Diagnosis Date  . Atrial flutter ([place])   . 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 Ablasion    . Cesarean section     No family history on file. Social History  Substance Use Topics  . Smoking status: Never Smoker   . Smokeless tobacco: None  . Alcohol Use: No   OB History    No data available     Review of Systems  All other systems reviewed and are negative.     Allergies  Amiodarone  Home Medications   Prior to Admission medications   Medication Sig Start Date End Date Taking? Authorizing Provider  Dabigatran Etexilate Mesylate (PRADAXA PO) Take 250 mg by mouth 2 (two) times daily. Take 125mg  twice daily   Yes Historical Provider, MD  Fish Oil-Cholecalciferol (FISH OIL + D3 PO) Take 1 capsule by mouth daily.   Yes Historical Provider, MD  metoprolol succinate (TOPROL-XL) 100 MG 24 hr tablet Take 100 mg by mouth daily. Take with or immediately following a meal.   Yes Historical Provider, MD  Multiple Vitamin (MULTIVITAMIN) tablet Take 1 tablet by mouth daily.   Yes Historical  Provider, MD   BP 154/70 mmHg  Temp(Src) 97.8 F (36.6 C) (Oral)  Resp 16  Ht 5\' 7"  (1.702 m)  Wt 150 lb (68.04 kg)  BMI 23.49 kg/m2  SpO2 100% Physical Exam  Constitutional: She is oriented to person, place, and time. She appears well-developed and well-nourished. No distress.  HENT:  Head: Normocephalic and atraumatic.  Eyes: EOM are normal.  Neck: Normal range of motion.  Cardiovascular: Normal rate, regular rhythm and normal heart sounds.   Pulmonary/Chest: Effort normal and breath sounds normal.  Abdominal: Soft. She exhibits no distension. There is no tenderness.  Musculoskeletal: Normal range of motion.  Small abrasion left elbow without significant swelling.  Full range of motion of left elbow.  Full range of motion bilateral knees, ankles, hips.  Full range of motion bilateral shoulders, elbows, wrists.  Neurological: She is alert and oriented to person, place, and time.  Skin: Skin is warm and dry.  Psychiatric: She has a normal mood and affect. Judgment normal.  Nursing note and vitals reviewed.   ED Course  Procedures (including critical care time) Labs Review Labs Reviewed  CBC - Abnormal; Notable for the following:    Hemoglobin 15.2 (*)    All other components within normal limits  BASIC METABOLIC PANEL  Imaging Review Dg Chest 2 View  02/13/2015  CLINICAL DATA:  Fall down steps with productive cough EXAM: CHEST - 2 VIEW COMPARISON:  None. FINDINGS: The heart size and mediastinal contours are within normal limits. Both lungs are clear. The visualized skeletal structures are unremarkable. IMPRESSION: No active disease. Electronically Signed   By: Inez Catalina M.D.   On: 02/13/2015 13:48   Ct Head Wo Contrast  02/13/2015  CLINICAL DATA:  [place] today down approximately 20 stairs, denies hitting head but possible loss of consciousness per her husband, on anti coagulation, history atrial fibrillation/flutter EXAM: CT HEAD WITHOUT CONTRAST TECHNIQUE: Contiguous  axial images were obtained from the base of the skull through the vertex without intravenous contrast. COMPARISON:  None FINDINGS: Mild atrophy. Normal ventricular morphology. No midline shift or mass effect. Normal appearance of brain parenchyma. No intracranial hemorrhage, mass lesion or evidence acute infarction. No extra-axial fluid collections. Small air-fluid level in LEFT maxillary sinus. Opacification of ethmoid air cells bilaterally. Mastoid air cells clear. Mild atherosclerotic calcification at the carotid siphons. No definite fractures. IMPRESSION: No acute intracranial abnormalities. Electronically Signed   By: Lavonia Dana M.D.   On: 02/13/2015 13:53   I have personally reviewed and evaluated these images and lab results as part of my medical decision-making.   EKG Interpretation   Date/Time:  Saturday February 13 2015 12:33:32 EST Ventricular Rate:  74 PR Interval:  150 QRS Duration: 92 QT Interval:  414 QTC Calculation: 459 R Axis:   67 Text Interpretation:  Sinus rhythm Anterior infarct, old Minimal ST  depression, inferior leads No old tracing to compare Confirmed by Ajee Heasley   MD, Lennette Bihari ([id number]) on 02/13/2015 1:40:03 PM      MDM   Final diagnoses:  Fall, initial encounter  Cough   Patient is overall well-appearing.  Head CT is negative.  Discharge home in good condition.  Chest x-ray obtained given URI symptoms over the past several days that also the husband concern.  No infiltrate noted.  Well-appearing.     Jola Schmidt, MD 02/13/15 [id number]

## 2015-02-16 ENCOUNTER — Inpatient Hospital Stay (HOSPITAL_COMMUNITY)
Admission: EM | Admit: 2015-02-16 | Discharge: 2015-02-19 | DRG: 023 | Disposition: A | Discharge status: Home Health | Payer: 59 | Attending: Neurology | Admitting: Neurology

## 2015-02-16 ENCOUNTER — Emergency Department (HOSPITAL_COMMUNITY): Payer: 59

## 2015-02-16 ENCOUNTER — Other Ambulatory Visit: Payer: Self-pay

## 2015-02-16 ENCOUNTER — Inpatient Hospital Stay (HOSPITAL_COMMUNITY): Payer: 59

## 2015-02-16 ENCOUNTER — Encounter (HOSPITAL_COMMUNITY)
Admission: EM | Disposition: A | Discharge status: Home Health | Payer: Self-pay | Source: Home / Self Care | Attending: Neurology

## 2015-02-16 ENCOUNTER — Encounter (HOSPITAL_COMMUNITY): Payer: Self-pay | Admitting: Emergency Medicine

## 2015-02-16 ENCOUNTER — Emergency Department (HOSPITAL_COMMUNITY): Payer: 59 | Admitting: Certified Registered Nurse Anesthetist

## 2015-02-16 DIAGNOSIS — I4892 Unspecified atrial flutter: Secondary | ICD-10-CM | POA: Diagnosis present

## 2015-02-16 DIAGNOSIS — Q211 Atrial septal defect: Secondary | ICD-10-CM | POA: Diagnosis not present

## 2015-02-16 DIAGNOSIS — K219 Gastro-esophageal reflux disease without esophagitis: Secondary | ICD-10-CM | POA: Diagnosis present

## 2015-02-16 DIAGNOSIS — I480 Paroxysmal atrial fibrillation: Secondary | ICD-10-CM | POA: Diagnosis present

## 2015-02-16 DIAGNOSIS — D66 Hereditary factor VIII deficiency: Secondary | ICD-10-CM | POA: Diagnosis present

## 2015-02-16 DIAGNOSIS — G8191 Hemiplegia, unspecified affecting right dominant side: Secondary | ICD-10-CM | POA: Diagnosis present

## 2015-02-16 DIAGNOSIS — Z7901 Long term (current) use of anticoagulants: Secondary | ICD-10-CM

## 2015-02-16 DIAGNOSIS — I4891 Unspecified atrial fibrillation: Secondary | ICD-10-CM | POA: Diagnosis present

## 2015-02-16 DIAGNOSIS — I6789 Other cerebrovascular disease: Secondary | ICD-10-CM | POA: Diagnosis not present

## 2015-02-16 DIAGNOSIS — R4701 Aphasia: Secondary | ICD-10-CM | POA: Diagnosis present

## 2015-02-16 DIAGNOSIS — I10 Essential (primary) hypertension: Secondary | ICD-10-CM | POA: Diagnosis present

## 2015-02-16 DIAGNOSIS — J9601 Acute respiratory failure with hypoxia: Secondary | ICD-10-CM | POA: Diagnosis present

## 2015-02-16 DIAGNOSIS — Z79899 Other long term (current) drug therapy: Secondary | ICD-10-CM

## 2015-02-16 DIAGNOSIS — I639 Cerebral infarction, unspecified: Secondary | ICD-10-CM | POA: Diagnosis present

## 2015-02-16 DIAGNOSIS — I6350 Cerebral infarction due to unspecified occlusion or stenosis of unspecified cerebral artery: Secondary | ICD-10-CM | POA: Diagnosis present

## 2015-02-16 DIAGNOSIS — Z8673 Personal history of transient ischemic attack (TIA), and cerebral infarction without residual deficits: Secondary | ICD-10-CM

## 2015-02-16 DIAGNOSIS — Z888 Allergy status to other drugs, medicaments and biological substances status: Secondary | ICD-10-CM

## 2015-02-16 DIAGNOSIS — G9340 Encephalopathy, unspecified: Secondary | ICD-10-CM | POA: Diagnosis present

## 2015-02-16 DIAGNOSIS — I63412 Cerebral infarction due to embolism of left middle cerebral artery: Secondary | ICD-10-CM | POA: Diagnosis present

## 2015-02-16 DIAGNOSIS — Q251 Coarctation of aorta: Secondary | ICD-10-CM

## 2015-02-16 DIAGNOSIS — Z7401 Bed confinement status: Secondary | ICD-10-CM | POA: Diagnosis not present

## 2015-02-16 DIAGNOSIS — Q2112 Patent foramen ovale: Secondary | ICD-10-CM

## 2015-02-16 DIAGNOSIS — Z9289 Personal history of other medical treatment: Secondary | ICD-10-CM

## 2015-02-16 DIAGNOSIS — Z4659 Encounter for fitting and adjustment of other gastrointestinal appliance and device: Secondary | ICD-10-CM

## 2015-02-16 HISTORY — PX: RADIOLOGY WITH ANESTHESIA: SHX6223

## 2015-02-16 LAB — DIFFERENTIAL
BASOS ABS: 0 10*3/uL (ref 0.0–0.1)
Basophils Relative: 0 %
Eosinophils Absolute: 0.3 10*3/uL (ref 0.0–0.7)
Eosinophils Relative: 4 %
LYMPHS ABS: 1.6 10*3/uL (ref 0.7–4.0)
Lymphocytes Relative: 20 %
MONO ABS: 0.5 10*3/uL (ref 0.1–1.0)
MONOS PCT: 7 %
NEUTROS ABS: 5.5 10*3/uL (ref 1.7–7.7)
Neutrophils Relative %: 69 %

## 2015-02-16 LAB — I-STAT CHEM 8, ED
BUN: 7 mg/dL (ref 6–20)
CALCIUM ION: 1.14 mmol/L (ref 1.13–1.30)
CHLORIDE: 104 mmol/L (ref 101–111)
CREATININE: 0.6 mg/dL (ref 0.44–1.00)
GLUCOSE: 115 mg/dL — AB (ref 65–99)
HCT: 41 % (ref 36.0–46.0)
HEMOGLOBIN: 13.9 g/dL (ref 12.0–15.0)
POTASSIUM: 3.9 mmol/L (ref 3.5–5.1)
Sodium: 139 mmol/L (ref 135–145)
TCO2: 24 mmol/L (ref 0–100)

## 2015-02-16 LAB — COMPREHENSIVE METABOLIC PANEL
ALT: 17 U/L (ref 14–54)
AST: 21 U/L (ref 15–41)
Albumin: 3.2 g/dL — ABNORMAL LOW (ref 3.5–5.0)
Alkaline Phosphatase: 83 U/L (ref 38–126)
Anion gap: 11 (ref 5–15)
BUN: 7 mg/dL (ref 6–20)
CO2: 22 mmol/L (ref 22–32)
Calcium: 9.2 mg/dL (ref 8.9–10.3)
Chloride: 104 mmol/L (ref 101–111)
Creatinine, Ser: 0.73 mg/dL (ref 0.44–1.00)
GFR calc Af Amer: >60 mL/min (ref >60)
GFR calc non Af Amer: >60 mL/min (ref >60)
Glucose, Bld: 121 mg/dL — ABNORMAL HIGH (ref 65–99)
Potassium: 4 mmol/L (ref 3.5–5.1)
Sodium: 137 mmol/L (ref 135–145)
Total Bilirubin: 0.9 mg/dL (ref 0.3–1.2)
Total Protein: 6.2 g/dL — ABNORMAL LOW (ref 6.5–8.1)

## 2015-02-16 LAB — CBC
HEMATOCRIT: 39 % (ref 36.0–46.0)
HEMOGLOBIN: 14.1 g/dL (ref 12.0–15.0)
MCH: 31.3 pg (ref 26.0–34.0)
MCHC: 36.2 g/dL — ABNORMAL HIGH (ref 30.0–36.0)
MCV: 86.7 fL (ref 78.0–100.0)
Platelets: 194 10*3/uL (ref 150–400)
RBC: 4.5 MIL/uL (ref 3.87–5.11)
RDW: 12.2 % (ref 11.5–15.5)
WBC: 8 10*3/uL (ref 4.0–10.5)

## 2015-02-16 LAB — URINALYSIS, ROUTINE W REFLEX MICROSCOPIC
Bilirubin Urine: NEGATIVE
Glucose, UA: NEGATIVE mg/dL
Ketones, ur: NEGATIVE mg/dL
Leukocytes, UA: NEGATIVE
NITRITE: NEGATIVE
PROTEIN: NEGATIVE mg/dL
SPECIFIC GRAVITY, URINE: 1.013 (ref 1.005–1.030)
pH: 7 (ref 5.0–8.0)

## 2015-02-16 LAB — ETHANOL

## 2015-02-16 LAB — RAPID URINE DRUG SCREEN, HOSP PERFORMED
Amphetamines: NOT DETECTED
Barbiturates: NOT DETECTED
Benzodiazepines: NOT DETECTED
Cocaine: NOT DETECTED
Opiates: NOT DETECTED
Tetrahydrocannabinol: NOT DETECTED

## 2015-02-16 LAB — GLUCOSE, CAPILLARY
GLUCOSE-CAPILLARY: 110 mg/dL — AB (ref 65–99)
GLUCOSE-CAPILLARY: 98 mg/dL (ref 65–99)

## 2015-02-16 LAB — TRIGLYCERIDES: TRIGLYCERIDES: 105 mg/dL (ref <150)

## 2015-02-16 LAB — URINE MICROSCOPIC-ADD ON

## 2015-02-16 LAB — I-STAT CG4 LACTIC ACID, ED: LACTIC ACID, VENOUS: 0.98 mmol/L (ref 0.5–2.0)

## 2015-02-16 LAB — MRSA PCR SCREENING: MRSA by PCR: NEGATIVE

## 2015-02-16 LAB — I-STAT TROPONIN, ED: TROPONIN I, POC: 0 ng/mL (ref 0.00–0.08)

## 2015-02-16 LAB — APTT: APTT: 36 s (ref 24–37)

## 2015-02-16 LAB — PROTIME-INR
INR: 1.37 (ref 0.00–1.49)
Prothrombin Time: 17 seconds — ABNORMAL HIGH (ref 11.6–15.2)

## 2015-02-16 SURGERY — RADIOLOGY WITH ANESTHESIA
Anesthesia: Choice

## 2015-02-16 MED ORDER — ACETAMINOPHEN 325 MG PO TABS: 650.0000 mg | ORAL_TABLET | ORAL | Status: DC | PRN

## 2015-02-16 MED ORDER — SENNOSIDES-DOCUSATE SODIUM 8.6-50 MG PO TABS: 1.0000 | ORAL_TABLET | Freq: Every evening | ORAL | Status: DC | PRN

## 2015-02-16 MED ORDER — LIDOCAINE HCL (CARDIAC) 20 MG/ML IV SOLN
INTRAVENOUS | Status: DC | PRN
  Administered 2015-02-16: 80 mg via INTRAVENOUS

## 2015-02-16 MED ORDER — SODIUM CHLORIDE 0.9 % IV SOLN: INTRAVENOUS | Status: DC

## 2015-02-16 MED ORDER — IOHEXOL 300 MG/ML  SOLN
250.0000 mL | Freq: Once | INTRAMUSCULAR | Status: AC | PRN
  Administered 2015-02-16: 100 mL via INTRA_ARTERIAL

## 2015-02-16 MED ORDER — EPTIFIBATIDE 20 MG/10ML IV SOLN
INTRAVENOUS | Status: AC | PRN
  Administered 2015-02-16 (×2): 2 mg

## 2015-02-16 MED ORDER — ACETAMINOPHEN 650 MG RE SUPP: 650.0000 mg | RECTAL | Status: DC | PRN

## 2015-02-16 MED ORDER — IOHEXOL 350 MG/ML SOLN
90.0000 mL | Freq: Once | INTRAVENOUS | Status: AC | PRN
  Administered 2015-02-16: 40 mL via INTRAVENOUS

## 2015-02-16 MED ORDER — PROPOFOL 500 MG/50ML IV EMUL
INTRAVENOUS | Status: DC | PRN
  Administered 2015-02-16: 50 ug/kg/min via INTRAVENOUS

## 2015-02-16 MED ORDER — NICARDIPINE HCL IN NACL 20-0.86 MG/200ML-% IV SOLN: 5.0000 mg/h | INTRAVENOUS | Status: DC

## 2015-02-16 MED ORDER — PANTOPRAZOLE SODIUM 40 MG IV SOLR
40.0000 mg | Freq: Every day | INTRAVENOUS | Status: DC
  Administered 2015-02-16 – 2015-02-18 (×3): 40 mg via INTRAVENOUS
  Filled 2015-02-16 (×3): qty 40

## 2015-02-16 MED ORDER — SODIUM CHLORIDE 0.9 % IV SOLN
10.0000 mg | INTRAVENOUS | Status: DC | PRN
  Administered 2015-02-16: 10 ug/min via INTRAVENOUS

## 2015-02-16 MED ORDER — CEFAZOLIN SODIUM-DEXTROSE 2-3 GM-% IV SOLR
INTRAVENOUS | Status: AC
  Administered 2015-02-16: 2 g via INTRAVENOUS
  Filled 2015-02-16: qty 50

## 2015-02-16 MED ORDER — STROKE: EARLY STAGES OF RECOVERY BOOK
Freq: Once | Status: AC
Start: 2015-02-16 — End: 2015-02-16
  Administered 2015-02-16: 13:00:00
  Filled 2015-02-16: qty 1

## 2015-02-16 MED ORDER — ROCURONIUM BROMIDE 100 MG/10ML IV SOLN
INTRAVENOUS | Status: DC | PRN
  Administered 2015-02-16: 50 mg via INTRAVENOUS
  Administered 2015-02-16: 40 mg via INTRAVENOUS

## 2015-02-16 MED ORDER — ACETAMINOPHEN 500 MG PO TABS: 1000.0000 mg | ORAL_TABLET | Freq: Four times a day (QID) | ORAL | Status: DC | PRN

## 2015-02-16 MED ORDER — ONDANSETRON HCL 4 MG/2ML IJ SOLN: 4.0000 mg | Freq: Four times a day (QID) | INTRAMUSCULAR | Status: DC | PRN

## 2015-02-16 MED ORDER — NICARDIPINE HCL IN NACL 20-0.86 MG/200ML-% IV SOLN
INTRAVENOUS | Status: AC
  Filled 2015-02-16: qty 200

## 2015-02-16 MED ORDER — CHLORHEXIDINE GLUCONATE 0.12% ORAL RINSE (MEDLINE KIT)
15.0000 mL | Freq: Two times a day (BID) | OROMUCOSAL | Status: DC
  Administered 2015-02-16: 15 mL via OROMUCOSAL

## 2015-02-16 MED ORDER — CHLORHEXIDINE GLUCONATE 0.12% ORAL RINSE (MEDLINE KIT)
15.0000 mL | Freq: Two times a day (BID) | OROMUCOSAL | Status: DC
  Administered 2015-02-17 (×2): 15 mL via OROMUCOSAL

## 2015-02-16 MED ORDER — PROPOFOL 10 MG/ML IV BOLUS
INTRAVENOUS | Status: DC | PRN
  Administered 2015-02-16: 150 mg via INTRAVENOUS

## 2015-02-16 MED ORDER — ARTIFICIAL TEARS OP OINT
TOPICAL_OINTMENT | OPHTHALMIC | Status: DC | PRN
  Administered 2015-02-16: 1 via OPHTHALMIC

## 2015-02-16 MED ORDER — ACETAMINOPHEN 650 MG RE SUPP: 650.0000 mg | Freq: Four times a day (QID) | RECTAL | Status: DC | PRN

## 2015-02-16 MED ORDER — SODIUM CHLORIDE 0.9 % IV SOLN
INTRAVENOUS | Status: DC
  Administered 2015-02-16 – 2015-02-18 (×2): via INTRAVENOUS

## 2015-02-16 MED ORDER — FENTANYL CITRATE (PF) 100 MCG/2ML IJ SOLN
INTRAMUSCULAR | Status: DC | PRN
  Administered 2015-02-16 (×5): 50 ug via INTRAVENOUS

## 2015-02-16 MED ORDER — ANTISEPTIC ORAL RINSE SOLUTION (CORINZ): 7.0000 mL | Freq: Four times a day (QID) | OROMUCOSAL | Status: DC

## 2015-02-16 MED ORDER — ANTISEPTIC ORAL RINSE SOLUTION (CORINZ)
7.0000 mL | OROMUCOSAL | Status: DC
  Administered 2015-02-16 – 2015-02-17 (×7): 7 mL via OROMUCOSAL

## 2015-02-16 MED ORDER — EPTIFIBATIDE 20 MG/10ML IV SOLN
INTRAVENOUS | Status: AC
  Administered 2015-02-16: 2 mg via INTRA_ARTERIAL
  Filled 2015-02-16: qty 10

## 2015-02-16 MED ORDER — LABETALOL HCL 5 MG/ML IV SOLN: 10.0000 mg | INTRAVENOUS | Status: DC | PRN

## 2015-02-16 MED ORDER — SODIUM CHLORIDE 0.9 % IV SOLN
INTRAVENOUS | Status: DC | PRN
  Administered 2015-02-16: 10:00:00 via INTRAVENOUS

## 2015-02-16 MED ORDER — NITROGLYCERIN 1 MG/10 ML FOR IR/CATH LAB
INTRA_ARTERIAL | Status: AC
  Filled 2015-02-16: qty 10

## 2015-02-16 MED ORDER — PROPOFOL 1000 MG/100ML IV EMUL
5.0000 ug/kg/min | INTRAVENOUS | Status: DC
  Administered 2015-02-16: 50 ug/kg/min via INTRAVENOUS
  Filled 2015-02-16 (×2): qty 100

## 2015-02-16 MED ORDER — SODIUM CHLORIDE 0.9 % IV SOLN
25.0000 ug/h | INTRAVENOUS | Status: DC
  Administered 2015-02-16: 50 ug/h via INTRAVENOUS
  Filled 2015-02-16: qty 50

## 2015-02-16 NOTE — H&P (Signed)
Chief Complaint: Wake up stroke, aphasia and R sided weakness  HPI:                                                                                                                                         Kendra [name] is an 69 y.o. female with a hx of factor 8 def and A flutter on pradaxa who was last seen normal at 11pm last night and woke up this am at 8am not speaking and moving her R side too well. CTH shows dense L MCA sign and perfusion study shows mismatch.  Date last known well: 02/15/15 Time last known well: Time: 11:00pm tPA Given: No: because of wake up stroke No Symptoms         0 No significant disability/able to carry out all usual activities   1 Unable to carry out all previous activities but looks after own affairs             2 Requires help but walks without assistance     3 Unable to walk without assistance/unable to handle own bodily needs 4 Bedridden/incontinent        5 Dead          6  Modified Rankin: Rankin Score=0    Past Medical History  Diagnosis Date  . Atrial flutter ([place])   . Atrial fibrillation ([facility])     chronic anticoag  . Arthus phenomenon   . Hypercholesterolemia     Pure  . GERD (gastroesophageal reflux disease)   . 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 TIA (transient ischemic attack)     as per 07/30/00 note from Theresa   . Hx of blood clots   . Hemorrhoids     Past Surgical History  Procedure Laterality Date  . Breast biopsy Right   . Appendectomy    . Fibroid tumor removal  1996  . Cardiac electrophysiology study and ablation      Family History  Problem Relation Age of Onset  . Alcohol abuse Father   . Lung cancer Father   . Colon cancer Neg Hx   . Other Brother     Emotional Illness   Social History:  reports that she has never smoked. She has never used smokeless tobacco. She reports that she does not drink alcohol or use illicit drugs.  Allergies:  Allergies  Allergen Reactions  . Amiodarone Hcl     REACTION: Rash, nausea     Medications:  I have reviewed the patient's current medications.  ROS:                                                                                                                                       History obtained from spouse  General ROS: negative for - chills, fatigue, fever, night sweats, weight gain or weight loss Psychological ROS: negative for - behavioral disorder, hallucinations, memory difficulties, mood swings or suicidal ideation Ophthalmic ROS: negative for - blurry vision, double vision, eye pain or loss of vision ENT ROS: negative for - epistaxis, nasal discharge, oral lesions, sore throat, tinnitus or vertigo Allergy and Immunology ROS: negative for - hives or itchy/watery eyes Hematological and Lymphatic ROS: negative for - bleeding problems, bruising or swollen lymph nodes Endocrine ROS: negative for - galactorrhea, hair pattern changes, polydipsia/polyuria or temperature intolerance Respiratory ROS: negative for - cough, hemoptysis, shortness of breath or wheezing Cardiovascular ROS: negative for - chest pain, dyspnea on exertion, edema or irregular heartbeat Gastrointestinal ROS: negative for - abdominal pain, diarrhea, hematemesis, nausea/vomiting or stool incontinence Genito-Urinary ROS: negative for - dysuria, hematuria, incontinence or urinary frequency/urgency Musculoskeletal ROS: negative for - joint swelling or muscular weakness Neurological ROS: as noted in HPI Dermatological ROS: negative for rash and skin lesion changes  Neurologic Examination:                                                                                                      Blood pressure 155/89, pulse 77, temperature 98.2 F (36.8 C), temperature source Rectal, resp. rate 14, SpO2 97 %.  HEENT-  Normocephalic, no lesions, without obvious abnormality.  Normal  external eye and conjunctiva.  Normal TM's bilaterally.  Normal auditory canals and external ears. Normal external nose, mucus membranes and septum.  Normal pharynx. Cardiovascular- irregularly irregular rhythm, pulses palpable throughout   Lungs- chest clear, no wheezing, rales, normal symmetric air entry, Heart exam - S1, S2 normal, no murmur, no gallop, rate regular Abdomen- soft, non-tender; bowel sounds normal; no masses,  no organomegaly Extremities- some contracture of the R arm Skin-warm and dry, no hyperpigmentation, vitiligo, or suspicious lesions  Neurological Examination Mental Status: expressive aphasia.  Able to follow minimal commands  Cranial Nerves: II: appears to have a R visual field cut. pupils equal, round, reactive to light and accommodation III,IV, VI: ptosis not present, L gaze preference and cannot cross midline  V,VII: smile asymmetric, R sided facial droop Motor: Right : Upper extremity   2/5  Left:     Upper extremity   5/5  Lower extremity   2/5     Lower extremity   5/5 Tone and bulk:normal tone throughout; no atrophy noted Plantars: Right: upgoing   Left: downgoing Cerebellar: Could not test Gait: could not test        Lab Results: Basic Metabolic Panel:  Recent Labs Lab 02/16/15 0830 02/16/15 0837  NA 137 139  K 4.0 3.9  CL 104 104  CO2 22  --   GLUCOSE 121* 115*  BUN 7 7  CREATININE 0.73 0.60  CALCIUM 9.2  --     Liver Function Tests:  Recent Labs Lab 02/16/15 0830  AST 21  ALT 17  ALKPHOS 83  BILITOT 0.9  PROT 6.2*  ALBUMIN 3.2*   No results for input(s): LIPASE, AMYLASE in the last 168 hours. No results for input(s): AMMONIA in the last 168 hours.  CBC:  Recent Labs Lab 02/16/15 0830 02/16/15 0837  WBC 8.0  --   NEUTROABS 5.5  --   HGB 14.1 13.9  HCT 39.0 41.0  MCV 86.7  --   PLT 194  --     Cardiac Enzymes: No results for input(s): CKTOTAL, CKMB, CKMBINDEX, TROPONINI in the last 168 hours.  Lipid  Panel: No results for input(s): CHOL, TRIG, HDL, CHOLHDL, VLDL, LDLCALC in the last 168 hours.  CBG: No results for input(s): GLUCAP in the last 168 hours.  Microbiology: Results for orders placed or performed in visit on 04/30/14  Urine culture     Status: None   Collection Time: 04/30/14 11:20 AM  Result Value Ref Range Status   Colony Count 2,000 COLONIES/ML  Final   Organism ID, Bacteria Insignificant Growth  Final    Coagulation Studies:  Recent Labs  02/16/15 0830  LABPROT 17.0*  INR 1.37    Imaging: Ct Head Wo Contrast  02/16/2015  CLINICAL DATA:  Altered mental status EXAM: CT HEAD WITHOUT CONTRAST TECHNIQUE: Contiguous axial images were obtained from the base of the skull through the vertex without intravenous contrast. COMPARISON:  None. FINDINGS: Ventricle size normal.  Cerebral volume normal for age. Negative for acute infarct. Mild chronic microvascular ischemic change in the white matter. Negative for intracranial hemorrhage.  Negative for mass or edema. Mucosal edema throughout the paranasal sinuses. Air-fluid level left maxillary sinus. No focal bony abnormality. IMPRESSION: No acute intracranial abnormality. Sinusitis with air-fluid level. Electronically Signed   By: Franchot Gallo M.D.   On: 02/16/2015 08:42   Ct Cerebral Perfusion W/cm  02/16/2015  CLINICAL DATA:  Left brain stroke clinically. EXAM: CT CEREBRAL PERFUSION WITH CONTRAST TECHNIQUE: CT perfusion study was performed CONTRAST:  75mL OMNIPAQUE IOHEXOL 350 MG/ML SOLN COMPARISON:  Head CT earlier same day FINDINGS: There is embolic disease to the left middle cerebral artery. Core infarction is evident in the left temporal lobe, the insula, the lateral basal ganglia in the deep frontoparietal white matter. Sizable penumbra is present throughout the majority of the temporal lobe and along the margins of the deep brain and frontoparietal infarctions. This examination suggests that there could be a significant  volume of salvageable brain. However, core infarction measures up to 8 cm in diameter. IMPRESSION: Core infarction measuring up to 8 cm in diameter in the left middle cerebral artery territory affecting the left temporal lobe, insula, basal ganglia and frontoparietal deep white matter. Fairly sizable penumbra around the margins of the core infarction. Electronically Signed   By: Jan Fireman.D.  On: 02/16/2015 10:28     Assessment: 69 y.o. female with a hx of factor 8 def and A flutter on pradaxa who was last seen normal at 11pm last night and woke up this am at 8am not speaking and moving her R side too well. CTH shows dense L MCA sign and perfusion study shows mismatch.  Going to IR for possible intervention Would recommend to wait 24hrs and repeat CTH and if no bleed then can possibly restart pradaxa or another NOAC SBP < 160  Stroke Risk Factors - atrial fibrillation and hypercoagulable state

## 2015-02-16 NOTE — Progress Notes (Signed)
Transported from CT to 52M on vent with CRNA and IR RN without complications.

## 2015-02-16 NOTE — Care Management Note (Signed)
Case Management Note  Patient Details  Name: ROXANNE DECKELMAN MRN: [id number] Date of Birth: February 02, 1946  Subjective/Objective:   Pt admitted on 02/16/15 with Lt MCA stroke.  PTA, pt independent, lives with spouse.                   Action/Plan: Will follow for discharge planning as pt progresses.    Expected Discharge Date:                  Expected Discharge Plan:  IP Rehab Facility  In-House Referral:  Clinical Social Work  Discharge planning Services  CM Consult  Post Acute Care Choice:    Choice offered to:     DME Arranged:    DME Agency:     HH Arranged:    [place] Agency:     Status of Service:  In process, will continue to follow  Medicare Important Message Given:    Date Medicare IM Given:    Medicare IM give by:    Date Additional Medicare IM Given:    Additional Medicare Important Message give by:     If discussed at [place] of Stay Meetings, dates discussed:    Additional Comments:  Reinaldo Raddle, RN, BSN  Trauma/Neuro ICU Case Manager [phone]

## 2015-02-16 NOTE — Anesthesia Postprocedure Evaluation (Signed)
Anesthesia Post Note  Patient: Kendra [name]  Procedure(s) Performed: Procedure(s) (LRB): RADIOLOGY WITH ANESTHESIA (N/A)  Patient location during evaluation: NICU Anesthesia Type: General Level of consciousness: patient remains intubated per anesthesia plan Pain management: pain level controlled Vital Signs Assessment: post-procedure vital signs reviewed and stable Respiratory status: patient on ventilator - see flowsheet for VS Anesthetic complications: no    Last Vitals:  Filed Vitals:   02/16/15 0915 02/16/15 1243  BP: 155/89 111/54  Pulse: 77 74  Temp:    Resp: 14 14    Last Pain:  Filed Vitals:   02/16/15 1243  PainSc: 0-No pain                 Tosha Belgarde COKER

## 2015-02-16 NOTE — Progress Notes (Signed)
[facility] Progress Note Patient Name: DILCIA CLEVERLY DOB: 1945/06/17 MRN: [id number]   Date of Service  02/16/2015  HPI/Events of Note  Discolored et tube secretins  eICU Interventions  Tracheal aspirate culture        Jada Fass 02/16/2015, 8:13 PM

## 2015-02-16 NOTE — ED Notes (Signed)
This RN is with pt in CT.

## 2015-02-16 NOTE — Transfer of Care (Signed)
Immediate Anesthesia Transfer of Care Note  Patient: Kendra [name]  Procedure(s) Performed: Procedure(s): RADIOLOGY WITH ANESTHESIA (N/A)  Patient Location: ICU  Anesthesia Type:General  Level of Consciousness: unresponsive and Patient remains intubated per anesthesia plan  Airway & Oxygen Therapy: Patient remains intubated per anesthesia plan and Patient placed on Ventilator (see vital sign flow sheet for setting)  Post-op Assessment: Report given to RN and Post -op Vital signs reviewed and stable  Post vital signs: Reviewed and stable  Last Vitals:  Filed Vitals:   02/16/15 0900 02/16/15 0915  BP: 163/89 155/89  Pulse: 81 77  Temp:    Resp: 24 14    Complications: No apparent anesthesia complications

## 2015-02-16 NOTE — ED Notes (Signed)
Pt from home via [facility] with c/o right sided weakness, delayed right foot response to stimulus, incomprehensible speech.  EMS was unable to get pt to look past midline to right.  LSW was last night at 11pm.  Hx TIA, and factor 8.  EKG unremarkable.  Spouse reports hx of fall this past Sat which she was seen here for.  NAD, alert.

## 2015-02-16 NOTE — Anesthesia Preprocedure Evaluation (Signed)
Anesthesia Evaluation  Patient identified by MRN, date of birth, ID band Patient awake    Reviewed: Allergy & Precautions, NPO status , Patient's Chart, lab work & pertinent test results  Airway Mallampati: II  TM Distance: >3 FB Neck ROM: Full    Dental  (+) Teeth Intact   Pulmonary    breath sounds clear to auscultation       Cardiovascular  Rhythm:Regular Rate:Normal     Neuro/Psych    GI/Hepatic   Endo/Other    Renal/GU      Musculoskeletal   Abdominal   Peds  Hematology   Anesthesia Other Findings Patient was minimally responsive with R. hemiparesis  Reproductive/Obstetrics                             Anesthesia Physical Anesthesia Plan  ASA: IV and emergent  Anesthesia Plan: General   Post-op Pain Management:    Induction: Intravenous  Airway Management Planned: Oral ETT  Additional Equipment:   Intra-op Plan:   Post-operative Plan: Post-operative intubation/ventilation  Informed Consent: I have reviewed the patients History and Physical, chart, labs and discussed the procedure including the risks, benefits and alternatives for the proposed anesthesia with the patient or authorized representative who has indicated his/her understanding and acceptance.     Plan Discussed with:   Anesthesia Plan Comments:         Anesthesia Quick Evaluation

## 2015-02-16 NOTE — ED Provider Notes (Signed)
CSN: [id number]     Arrival date & time 02/16/15  0807 History   First MD Initiated Contact with Patient 02/16/15 [phone]     Chief Complaint  Patient presents with  . Stroke Symptoms     (Consider location/radiation/quality/duration/timing/severity/associated sxs/prior Treatment) HPI Comments: Patient with hx afib/flutter, TIA, on pradaxa presents to the ED with a chief complaint of aphasia and right sided weakness.  Precise onset is unknown.  Last seen normal last night at 11pm.  Symptoms were first noticed by husband this morning when patient awoke.  Patient unable to assist with hx.  Level 5 caveat applies.  The history is provided by the patient. No language interpreter was used.    Past Medical History  Diagnosis Date  . Atrial flutter ([place])   . Atrial fibrillation ([facility])     chronic anticoag  . Arthus phenomenon   . Hypercholesterolemia     Pure  . GERD (gastroesophageal reflux disease)   . Kidney infarction [facility])   . TIA (transient ischemic attack)     as per 07/30/00 note from [place]   . Hx of blood clots   . Hemorrhoids    Past Surgical History  Procedure Laterality Date  . Breast biopsy Right   . Appendectomy    . Fibroid tumor removal  1996  . Cardiac electrophysiology study and ablation     Family History  Problem Relation Age of Onset  . Alcohol abuse Father   . Lung cancer Father   . Colon cancer Neg Hx   . Other Brother     Emotional Illness   Social History  Substance Use Topics  . Smoking status: Never Smoker   . Smokeless tobacco: Never Used  . Alcohol Use: No   OB History    No data available     Review of Systems  Unable to perform ROS: Acuity of condition      Allergies  Amiodarone hcl  Home Medications   Prior to Admission medications   Medication Sig Start Date End Date Taking? Authorizing Provider  dabigatran (PRADAXA) 150 MG CAPS capsule Take 1 capsule (150 mg total) by mouth 2 (two) times daily. 09/16/14   Evans Lance, MD   metoprolol succinate (TOPROL-XL) 100 MG 24 hr tablet TAKE ONE TABLET BY MOUTH ONCE DAILY WITH A MEAL OR IMMEDIATELY FOLLOWING A MEAL. 09/16/14   Evans Lance, MD  Multiple Vitamin (MULTIVITAMIN) tablet Take 1 tablet by mouth daily.      Historical Provider, MD  Omega-3 Fatty Acids (FISH OIL) 1000 MG CAPS Take 1 capsule by mouth daily.      Historical Provider, MD   BP 163/89 mmHg  Pulse 81  Temp(Src) 98.2 F (36.8 C) (Rectal)  Resp 24  SpO2 98% Physical Exam  Constitutional: She is oriented to person, place, and time. She appears well-developed and well-nourished.  HENT:  Head: Normocephalic and atraumatic.  Eyes: Conjunctivae and EOM are normal. Pupils are equal, round, and reactive to light.  Unable to look to right  Neck: Normal range of motion. Neck supple.  Cardiovascular: Normal rate and regular rhythm.  Exam reveals no gallop and no friction rub.   No murmur heard. Pulmonary/Chest: Effort normal and breath sounds normal. No respiratory distress. She has no wheezes. She has no rales. She exhibits no tenderness.  Abdominal: Soft. Bowel sounds are normal. She exhibits no distension and no mass. There is no tenderness. There is no rebound and no guarding.  Musculoskeletal: Normal range  of motion. She exhibits no edema or tenderness.  Right upper extremity held in contracture   Neurological: She is alert and oriented to person, place, and time.  Aphasia L lateral gaze Mild R facial droop Strength deficit in right upper and lower 3/5 Left upper and lower 5/5  Skin: Skin is warm and dry.  Psychiatric: She has a normal mood and affect. Her behavior is normal. Judgment and thought content normal.  Nursing note and vitals reviewed.   ED Course  Procedures (including critical care time) Labs Review Labs Reviewed  PROTIME-INR - Abnormal; Notable for the following:    Prothrombin Time 17.0 (*)    All other components within normal limits  COMPREHENSIVE METABOLIC PANEL -  Abnormal; Notable for the following:    Glucose, Bld 121 (*)    Total Protein 6.2 (*)    Albumin 3.2 (*)    All other components within normal limits  I-STAT CHEM 8, ED - Abnormal; Notable for the following:    Glucose, Bld 115 (*)    All other components within normal limits  APTT  ETHANOL  CBC  DIFFERENTIAL  URINE RAPID DRUG SCREEN, HOSP PERFORMED  URINALYSIS, ROUTINE W REFLEX MICROSCOPIC (NOT AT [facility])  I-STAT TROPOININ, ED  I-STAT CG4 LACTIC ACID, ED    Imaging Review Ct Head Wo Contrast  02/16/2015  CLINICAL DATA:  Altered mental status EXAM: CT HEAD WITHOUT CONTRAST TECHNIQUE: Contiguous axial images were obtained from the base of the skull through the vertex without intravenous contrast. COMPARISON:  None. FINDINGS: Ventricle size normal.  Cerebral volume normal for age. Negative for acute infarct. Mild chronic microvascular ischemic change in the white matter. Negative for intracranial hemorrhage.  Negative for mass or edema. Mucosal edema throughout the paranasal sinuses. Air-fluid level left maxillary sinus. No focal bony abnormality. IMPRESSION: No acute intracranial abnormality. Sinusitis with air-fluid level. Electronically Signed   By: Franchot Gallo M.D.   On: 02/16/2015 08:42   I have personally reviewed and evaluated these images and lab results as part of my medical decision-making.   EKG Interpretation   Date/Time:  Tuesday February 16 2015 08:17:13 EST Ventricular Rate:  80 PR Interval:  141 QRS Duration: 94 QT Interval:  388 QTC Calculation: 448 R Axis:   69 Text Interpretation:  Sinus rhythm Baseline wander in lead(s) V1 Confirmed  by ZACKOWSKI  MD, SCOTT ([id number]) on 02/16/2015 8:53:56 AM      MDM   Final diagnoses:  CVA (cerebral infarction)  CVA (cerebral infarction)    Patient immediately discussed with Dr. Rogene Houston, who agrees with stroke workup.  Last seen normal last night at 11:00pm.  Code stroke not activated due to timeframe.  However, RN  called CT for stat scan.    Dr. Rogene Houston has seen patient and consulted neurology.  CT head negative.  Neuro recommends CT angio head and neck.    Dr. Rogene Houston has updated family members.  Not a candidate for tpa given timing and anticoagulation with pradaxa.    Patient admitted by neurology.    Montine Circle, PA-C 02/16/15 1146

## 2015-02-16 NOTE — Sedation Documentation (Signed)
Brought pt to IR #2 from ED in stretcher. Pt is awake and mumbling. Answers simple questions. Able to grip with left hand. Will not grip or move right arm but will not allow for right arm to be moved. Denies any pain but says she is anxious. Pt is thrashing in bed and appears uncomfortable. Moves legs up and down bed on her own, right weaker than left. Slight facial droop noted with eyes deviated to left. Family at bedside and updated.

## 2015-02-16 NOTE — ED Provider Notes (Signed)
Medical screening examination/treatment/procedure(s) were conducted as a shared visit with non-physician practitioner(s) and myself.  I personally evaluated the patient during the encounter.   EKG Interpretation None      ED ECG REPORT   Date: 02/16/2015  Rate: 80  Rhythm: normal sinus rhythm  QRS Axis: normal  Intervals: normal  ST/T Wave abnormalities: normal  Conduction Disutrbances:none  Narrative Interpretation:   Old EKG Reviewed: none available  I have personally reviewed the EKG tracing and agree with the computerized printout as noted.     Results for orders placed or performed during the hospital encounter of 02/16/15  Protime-INR  Result Value Ref Range   Prothrombin Time 17.0 (H) 11.6 - 15.2 seconds   INR 1.37 0.00 - 1.49  APTT  Result Value Ref Range   aPTT 36 24 - 37 seconds  Comprehensive metabolic panel  Result Value Ref Range   Sodium 137 135 - 145 mmol/L   Potassium 4.0 3.5 - 5.1 mmol/L   Chloride 104 101 - 111 mmol/L   CO2 22 22 - 32 mmol/L   Glucose, Bld 121 (H) 65 - 99 mg/dL   BUN 7 6 - 20 mg/dL   Creatinine, Ser 0.73 0.44 - 1.00 mg/dL   Calcium 9.2 8.9 - 10.3 mg/dL   Total Protein 6.2 (L) 6.5 - 8.1 g/dL   Albumin 3.2 (L) 3.5 - 5.0 g/dL   AST 21 15 - 41 U/L   ALT 17 14 - 54 U/L   Alkaline Phosphatase 83 38 - 126 U/L   Total Bilirubin 0.9 0.3 - 1.2 mg/dL   GFR calc non Af Amer >60 >60 mL/min   GFR calc Af Amer >60 >60 mL/min   Anion gap 11 5 - 15  I-Stat Chem 8, ED  (not at [facility], [facility])  Result Value Ref Range   Sodium 139 135 - 145 mmol/L   Potassium 3.9 3.5 - 5.1 mmol/L   Chloride 104 101 - 111 mmol/L   BUN 7 6 - 20 mg/dL   Creatinine, Ser 0.60 0.44 - 1.00 mg/dL   Glucose, Bld 115 (H) 65 - 99 mg/dL   Calcium, Ion 1.14 1.13 - 1.30 mmol/L   TCO2 24 0 - 100 mmol/L   Hemoglobin 13.9 12.0 - 15.0 g/dL   HCT 41.0 36.0 - 46.0 %  I-stat troponin, ED (not at [facility], [facility])  Result Value Ref Range   Troponin i, poc 0.00 0.00 - 0.08 ng/mL   Comment 3          I-Stat CG4 Lactic Acid, ED  Result Value Ref Range   Lactic Acid, Venous 0.98 0.5 - 2.0 mmol/L   Ct Head Wo Contrast  02/16/2015  CLINICAL DATA:  Altered mental status EXAM: CT HEAD WITHOUT CONTRAST TECHNIQUE: Contiguous axial images were obtained from the base of the skull through the vertex without intravenous contrast. COMPARISON:  None. FINDINGS: Ventricle size normal.  Cerebral volume normal for age. Negative for acute infarct. Mild chronic microvascular ischemic change in the white matter. Negative for intracranial hemorrhage.  Negative for mass or edema. Mucosal edema throughout the paranasal sinuses. Air-fluid level left maxillary sinus. No focal bony abnormality. IMPRESSION: No acute intracranial abnormality. Sinusitis with air-fluid level. Electronically Signed   By: Franchot Gallo M.D.   On: 02/16/2015 08:42   Patient clinically with evidence of stroke has contraction of the right arm seems to have visual field defect on the right side as well as aphasic area exact timing of this  is not clear patient last definitely normal at 11:00 at night before she went to bed. Patient is on Pradaxa patient did have a fall several days ago.   Patient not a candidate for TPA based on time and also patient is on Pradaxa.  Discussed with the neural hospitalists and they requested ordering CT angiogram head and neck were placed may be consideration for interventional radiology. Neural hospitalists are in the room and  Also have spoken with the daughter who is a physician in [place] and updated her on the situation.  Head Ct without acute findings.   Medical screening examination/treatment/procedure(s) were conducted as a shared visit with non-physician practitioner(s) and myself.  I personally evaluated the patient during the encounter.   EKG Interpretation   Date/Time:  Tuesday February 16 2015 08:17:13 EST Ventricular Rate:  80 PR Interval:  141 QRS Duration: 94 QT  Interval:  388 QTC Calculation: 448 R Axis:   69 Text Interpretation:  Sinus rhythm Baseline wander in lead(s) V1 Confirmed  by Kenadie Royce  MD, Jule Whitsel ([id number]) on 02/16/2015 8:53:56 AM       CRITICAL CARE Performed by: Fredia Sorrow Total critical care time: 30 minutes Critical care time was exclusive of separately billable procedures and treating other patients. Critical care was necessary to treat or prevent imminent or life-threatening deterioration. Critical care was time spent personally by me on the following activities: development of treatment plan with patient and/or surrogate as well as nursing, discussions with consultants, evaluation of patient's response to treatment, examination of patient, obtaining history from patient or surrogate, ordering and performing treatments and interventions, ordering and review of laboratory studies, ordering and review of radiographic studies, pulse oximetry and re-evaluation of patient's condition.  Fredia Sorrow, MD 02/16/15 0930

## 2015-02-16 NOTE — Sedation Documentation (Signed)
Report given to [place], RN.

## 2015-02-16 NOTE — Consult Note (Signed)
PULMONARY / CRITICAL CARE MEDICINE   Name: Kendra [name] MRN: [id number] DOB: [birth year]    ADMISSION DATE:  02/16/2015 CONSULTATION DATE:  12/20  REFERRING MD:  Thompson Caul  CHIEF COMPLAINT:  AMS, left side weakness  HISTORY OF PRESENT ILLNESS:   69 yo with hx of TIA, on pradaxa for PAF/Flutter, last seen intact 12/19 2300 and found 12/20 7 a with left sided weakness. CTA with rt event. Going to IR for intervention and PCCM asked to manage the vent.Currently in IR and in no acute distress. Being intubated for procedure.  PAST MEDICAL HISTORY :  She  has a past medical history of Atrial flutter ([place]); Atrial fibrillation ([place]); Arthus phenomenon; Hypercholesterolemia; GERD (gastroesophageal reflux disease); Kidney infarction [facility]); TIA (transient ischemic attack); blood clots; and Hemorrhoids.  PAST SURGICAL HISTORY: She  has past surgical history that includes Breast biopsy (Right); Appendectomy; fibroid tumor removal (1996); and Cardiac electrophysiology study and ablation.  Allergies  Allergen Reactions  . Amiodarone Hcl     REACTION: Rash, nausea    No current facility-administered medications on file prior to encounter.   Current Outpatient Prescriptions on File Prior to Encounter  Medication Sig  . dabigatran (PRADAXA) 150 MG CAPS capsule Take 1 capsule (150 mg total) by mouth 2 (two) times daily.  . metoprolol succinate (TOPROL-XL) 100 MG 24 hr tablet TAKE ONE TABLET BY MOUTH ONCE DAILY WITH A MEAL OR IMMEDIATELY FOLLOWING A MEAL.  . Multiple Vitamin (MULTIVITAMIN) tablet Take 1 tablet by mouth daily.    . Omega-3 Fatty Acids (FISH OIL) 1000 MG CAPS Take 1 capsule by mouth daily.      FAMILY HISTORY:  Her indicated that her mother is deceased.   SOCIAL HISTORY: She  reports that she has never smoked. She has never used smokeless tobacco. She reports that she does not drink alcohol or use illicit drugs.  REVIEW OF SYSTEMS:   na  SUBJECTIVE:    VITAL SIGNS: BP  155/89 mmHg  Pulse 77  Temp(Src) 98.2 F (36.8 C) (Rectal)  Resp 14  SpO2 97%  HEMODYNAMICS:    VENTILATOR SETTINGS:    INTAKE / OUTPUT:    PHYSICAL EXAMINATION: General:  Anxious, confused, left side wekness Neuro: NO follow commands. Won't open eyes,  HEENT:  No jvd/lan Cardiovascular:  HJSR RRR Lungs:  Decreased in bases Abdomen:  Soft +bs Musculoskeletal:  Intact Skin:  Warm and dry  LABS:  BMET  Recent Labs Lab 02/16/15 0830 02/16/15 0837  NA 137 139  K 4.0 3.9  CL 104 104  CO2 22  --   BUN 7 7  CREATININE 0.73 0.60  GLUCOSE 121* 115*    Electrolytes  Recent Labs Lab 02/16/15 0830  CALCIUM 9.2    CBC  Recent Labs Lab 02/16/15 0830 02/16/15 0837  WBC 8.0  --   HGB 14.1 13.9  HCT 39.0 41.0  PLT 194  --     Coag's  Recent Labs Lab 02/16/15 0830  APTT 36  INR 1.37    Sepsis Markers  Recent Labs Lab 02/16/15 0838  LATICACIDVEN 0.98    ABG No results for input(s): PHART, PCO2ART, PO2ART in the last 168 hours.  Liver Enzymes  Recent Labs Lab 02/16/15 0830  AST 21  ALT 17  ALKPHOS 83  BILITOT 0.9  ALBUMIN 3.2*    Cardiac Enzymes No results for input(s): TROPONINI, PROBNP in the last 168 hours.  Glucose No results for input(s): GLUCAP in the last 168 hours.  Imaging Ct Head Wo Contrast  02/16/2015  CLINICAL DATA:  Altered mental status EXAM: CT HEAD WITHOUT CONTRAST TECHNIQUE: Contiguous axial images were obtained from the base of the skull through the vertex without intravenous contrast. COMPARISON:  None. FINDINGS: Ventricle size normal.  Cerebral volume normal for age. Negative for acute infarct. Mild chronic microvascular ischemic change in the white matter. Negative for intracranial hemorrhage.  Negative for mass or edema. Mucosal edema throughout the paranasal sinuses. Air-fluid level left maxillary sinus. No focal bony abnormality. IMPRESSION: No acute intracranial abnormality. Sinusitis with air-fluid level.  Electronically Signed   By: Franchot Gallo M.D.   On: 02/16/2015 08:42     STUDIES:  CT as noted  CULTURES:   ANTIBIOTICS: none  SIGNIFICANT EVENTS: 12/19 stroke  LINES/TUBES: 12/20 OTT>>  DISCUSSION: 69 yo with hx of TIA, on pradaxa for PAF/Flutter, last seen intact 12/19 2300 and found 12/20 7 a with left sided weakness. CTA with rt event. Going to IR for intervention and PCCM asked to manage the vent.Currently in IR and in no acute distress. Being intubated for procedure.  ASSESSMENT / PLAN:  PULMONARY A: Intubated for IR procedure P:   Vent bundle Wean per neuro input  CARDIOVASCULAR A:  Hx of PAF/Flutter followed by [facility]. On pradaxa  P:  Stop anticoagulation  RENAL Lab Results  Component Value Date   CREATININE 0.60 02/16/2015   CREATININE 0.73 02/16/2015   CREATININE 0.8 02/10/2009    A:   No acute issue P:   Follow labs after dye load  GASTROINTESTINAL A:   GI protection P:   PPI  HEMATOLOGIC Lab Results  Component Value Date   INR 1.37 02/16/2015   INR 1.3 01/28/2010   INR 2.6 12/22/2009    Recent Labs  02/16/15 0830 02/16/15 0837  HGB 14.1 13.9    A:   On pravda for PAF/Flutter  P:  Stop anticoagulation  INFECTIOUS A:   No evidence of infection P:     ENDOCRINE A:   No acute issue P:   Follow labs  NEUROLOGIC A:   Stroke with planned intervention 12/20 Left sided weakness. Last seen intact 2300 02/15/15 P:   RASS goal: -1 Sedation will be guided by neuro post intervention    FAMILY  - Updates:   Richardson Landry Minor ACNP Maryanna Shape PCCM Pager [phone] till 3 pm If no answer page [phone] 02/16/2015, 10:31 AM  Attending Note:  69 yea rold female with left sided weakness, R MCA occlusion.  Taken to IR and revascularized.  Left intubated post procedure.  On exam lungs are clear.  Vent settings adjusted.  Will f/u CXR and ABG now.  Sheaths in til 5 PM, will likely leave intubated overnight then wean to extubate  in AM.  BP control with SBP target of 120-130, cardene and propofol available.  If unable to extubate in AM then will start TF in AM.  Hold off weaning for today.  Titrate O2 for sats.  The patient is critically ill with multiple organ systems failure and requires high complexity decision making for assessment and support, frequent evaluation and titration of therapies, application of advanced monitoring technologies and extensive interpretation of multiple databases.   Critical Care Time devoted to patient care services described in this note is  35  Minutes. This time reflects time of care of this signee Dr Jennet Maduro. This critical care time does not reflect procedure time, or teaching time or supervisory time of PA/NP/Med student/Med Resident  etc but could involve care discussion time.  Rush Farmer, M.D. [facility] Pulmonary/Critical Care Medicine. Pager: [phone]. After hours pager: [phone].

## 2015-02-16 NOTE — Anesthesia Procedure Notes (Signed)
Procedure Name: Intubation Date/Time: 02/16/2015 10:37 AM Performed by: Rogers Blocker Pre-anesthesia Checklist: Patient identified, Emergency Drugs available, Suction available, Patient being monitored and Timeout performed Patient Re-evaluated:Patient Re-evaluated prior to inductionOxygen Delivery Method: Circle system utilized Preoxygenation: Pre-oxygenation with 100% oxygen Intubation Type: IV induction Ventilation: Mask ventilation without difficulty Laryngoscope Size: Mac and 4 Grade View: Grade I Tube type: Subglottic suction tube Tube size: 7.5 mm Number of attempts: 1 Airway Equipment and Method: Stylet Placement Confirmation: ETT inserted through vocal cords under direct vision,  positive ETCO2,  CO2 detector and breath sounds checked- equal and bilateral Secured at: 22 cm Tube secured with: Tape Dental Injury: Teeth and Oropharynx as per pre-operative assessment

## 2015-02-16 NOTE — Procedures (Signed)
S/P Bilateral common carotid arteriograms,followed by complete angiographic  Revascularization of occluded LT MCA using x 1 pass with 16mm x 40 mm Solitaire retrieval device and approx 5 mg of superselective IA integrelin . TICI 3  revascularization

## 2015-02-17 ENCOUNTER — Encounter (HOSPITAL_COMMUNITY): Payer: Self-pay | Admitting: Radiology

## 2015-02-17 ENCOUNTER — Other Ambulatory Visit (HOSPITAL_COMMUNITY): Payer: 59

## 2015-02-17 ENCOUNTER — Inpatient Hospital Stay (HOSPITAL_COMMUNITY): Payer: 59

## 2015-02-17 DIAGNOSIS — I6789 Other cerebrovascular disease: Secondary | ICD-10-CM

## 2015-02-17 LAB — BASIC METABOLIC PANEL
Anion gap: 10 (ref 5–15)
BUN: 8 mg/dL (ref 6–20)
CHLORIDE: 109 mmol/L (ref 101–111)
CO2: 20 mmol/L — AB (ref 22–32)
Calcium: 8.5 mg/dL — ABNORMAL LOW (ref 8.9–10.3)
Creatinine, Ser: 0.72 mg/dL (ref 0.44–1.00)
GFR calc Af Amer: >60 mL/min (ref >60)
GFR calc non Af Amer: >60 mL/min (ref >60)
GLUCOSE: 102 mg/dL — AB (ref 65–99)
POTASSIUM: 3.8 mmol/L (ref 3.5–5.1)
Sodium: 139 mmol/L (ref 135–145)

## 2015-02-17 LAB — GLUCOSE, CAPILLARY
GLUCOSE-CAPILLARY: 92 mg/dL (ref 65–99)
GLUCOSE-CAPILLARY: 89 mg/dL (ref 65–99)
GLUCOSE-CAPILLARY: 95 mg/dL (ref 65–99)
Glucose-Capillary: 91 mg/dL (ref 65–99)

## 2015-02-17 LAB — CBC WITH DIFFERENTIAL/PLATELET
Basophils Absolute: 0 10*3/uL (ref 0.0–0.1)
Basophils Relative: 0 %
EOS PCT: 2 %
Eosinophils Absolute: 0.1 10*3/uL (ref 0.0–0.7)
HEMATOCRIT: 32.9 % — AB (ref 36.0–46.0)
Hemoglobin: 12 g/dL (ref 12.0–15.0)
LYMPHS ABS: 1 10*3/uL (ref 0.7–4.0)
LYMPHS PCT: 15 %
MCH: 31.9 pg (ref 26.0–34.0)
MCHC: 36.5 g/dL — ABNORMAL HIGH (ref 30.0–36.0)
MCV: 87.5 fL (ref 78.0–100.0)
Monocytes Absolute: 0.7 10*3/uL (ref 0.1–1.0)
Monocytes Relative: 10 %
NEUTROS ABS: 5 10*3/uL (ref 1.7–7.7)
Neutrophils Relative %: 73 %
PLATELETS: 178 10*3/uL (ref 150–400)
RBC: 3.76 MIL/uL — AB (ref 3.87–5.11)
RDW: 12.3 % (ref 11.5–15.5)
WBC: 6.8 10*3/uL (ref 4.0–10.5)

## 2015-02-17 LAB — CULTURE, RESPIRATORY W GRAM STAIN: Special Requests: NORMAL

## 2015-02-17 LAB — LIPID PANEL
Cholesterol: 121 mg/dL (ref 0–200)
HDL: 30 mg/dL — AB (ref >40)
LDL Cholesterol: 67 mg/dL (ref 0–99)
TRIGLYCERIDES: 118 mg/dL (ref <150)
Total CHOL/HDL Ratio: 4 RATIO
VLDL: 24 mg/dL (ref 0–40)

## 2015-02-17 LAB — CULTURE, RESPIRATORY

## 2015-02-17 MED ORDER — APIXABAN 5 MG PO TABS
5.0000 mg | ORAL_TABLET | Freq: Two times a day (BID) | ORAL | Status: DC
  Administered 2015-02-17 – 2015-02-19 (×4): 5 mg via ORAL
  Filled 2015-02-17 (×4): qty 1

## 2015-02-17 MED ORDER — CETYLPYRIDINIUM CHLORIDE 0.05 % MT LIQD
7.0000 mL | Freq: Two times a day (BID) | OROMUCOSAL | Status: DC
  Administered 2015-02-17 – 2015-02-19 (×2): 7 mL via OROMUCOSAL

## 2015-02-17 MED ORDER — ASPIRIN 300 MG RE SUPP
300.0000 mg | Freq: Every day | RECTAL | Status: DC
  Administered 2015-02-17: 300 mg via RECTAL
  Filled 2015-02-17: qty 1

## 2015-02-17 NOTE — Progress Notes (Signed)
PT Cancellation Note  Patient Details Name: Kendra [name] MRN: [id number] DOB: 09-17-1945   Cancelled Treatment:    Reason Eval/Treat Not Completed: Patient not medically ready.  Pt currently with multiple bedrest orders.  Will need updated activity orders once pt appropriate for PT and mobility.  Will f/u as appropriate.     Catarina Hartshorn, [place] 02/17/2015, 8:45 AM

## 2015-02-17 NOTE — Progress Notes (Signed)
At 1050am 17fr sheath removed from right femoral artery post exoseal deployment. Right distal pulses present. Manual pressure held for 10 minutes. Right groin dressed with guaze and tegadurm as well as pressure dressing applied. At 1055 am 35fr sheath removed from left femoral artery and pressure held for 39minutes. Left distal pulses present. Left groin dressed with gauze and tegadurm as well as pressure dressing applied.

## 2015-02-17 NOTE — Progress Notes (Signed)
PULMONARY / CRITICAL CARE MEDICINE   Name: Kendra [name] MRN: [id number] DOB: 07-10-1945    ADMISSION DATE:  02/16/2015 CONSULTATION DATE:  12/20  REFERRING MD:  Thompson Caul  CHIEF COMPLAINT:  AMS, left side weakness  HISTORY OF PRESENT ILLNESS:   69 yo with hx of TIA, on pradaxa for PAF/Flutter, last seen intact 12/19 2300 and found 12/20 7 a with left sided weakness. CTA with rt event. Going to IR for intervention and PCCM asked to manage the vent.Currently in IR and in no acute distress. Being intubated for procedure.  PAST MEDICAL HISTORY :  She  has a past medical history of Atrial flutter ([place]); Atrial fibrillation ([place]); Arthus phenomenon; Hypercholesterolemia; GERD (gastroesophageal reflux disease); Kidney infarction [facility]); TIA (transient ischemic attack); blood clots; and Hemorrhoids.  PAST SURGICAL HISTORY: She  has past surgical history that includes Breast biopsy (Right); Appendectomy; fibroid tumor removal (1996); and Cardiac electrophysiology study and ablation.  Allergies  Allergen Reactions  . Amiodarone Hcl     REACTION: Rash, nausea    No current facility-administered medications on file prior to encounter.   Current Outpatient Prescriptions on File Prior to Encounter  Medication Sig  . dabigatran (PRADAXA) 150 MG CAPS capsule Take 1 capsule (150 mg total) by mouth 2 (two) times daily.  . metoprolol succinate (TOPROL-XL) 100 MG 24 hr tablet TAKE ONE TABLET BY MOUTH ONCE DAILY WITH A MEAL OR IMMEDIATELY FOLLOWING A MEAL.  . Multiple Vitamin (MULTIVITAMIN) tablet Take 1 tablet by mouth daily.    . Omega-3 Fatty Acids (FISH OIL) 1000 MG CAPS Take 1 capsule by mouth daily.      FAMILY HISTORY:  Her indicated that her mother is deceased.   SOCIAL HISTORY: She  reports that she has never smoked. She has never used smokeless tobacco. She reports that she does not drink alcohol or use illicit drugs.  REVIEW OF SYSTEMS:   na  SUBJECTIVE:    VITAL SIGNS: BP  136/64 mmHg  Pulse 87  Temp(Src) 98.9 F (37.2 C) (Axillary)  Resp 18  Ht 5\' 7"  (1.702 m)  Wt 66.8 kg (147 lb 4.3 oz)  BMI 23.06 kg/m2  SpO2 100%  HEMODYNAMICS:    VENTILATOR SETTINGS: Vent Mode:  [-] CPAP;PSV FiO2 (%):  [40 %] 40 % Set Rate:  [16 bmp] 16 bmp Vt Set:  [520 mL-550 mL] 520 mL PEEP:  [5 cmH20] 5 cmH20 Pressure Support:  [5 cmH20-10 cmH20] 5 cmH20 Plateau Pressure:  [16 cmH20-22 cmH20] 17 cmH20  INTAKE / OUTPUT: I/O last 3 completed shifts: In: 2125.2 [I.V.:2125.2] Out: [id number] [Urine:1490; Blood:50]  PHYSICAL EXAMINATION: General:  Awake and following command. Neuro: Moving all ext to command. HEENT:  [place]/AT, PERRL, EOM-I and MMM. Cardiovascular:  RRR, Nl S1/S2, -M/R/G. Lungs:  CTA bilaterally. Abdomen:  Soft, ND, ND and +BS. Musculoskeletal:  -edema and -tenderness. Skin:  Warm and dry  LABS:  BMET  Recent Labs Lab 02/16/15 0830 02/16/15 0837 02/17/15 0615  NA 137 139 139  K 4.0 3.9 3.8  CL 104 104 109  CO2 22  --  20*  BUN 7 7 8   CREATININE 0.73 0.60 0.72  GLUCOSE 121* 115* 102*   Electrolytes  Recent Labs Lab 02/16/15 0830 02/17/15 0615  CALCIUM 9.2 8.5*   CBC  Recent Labs Lab 02/16/15 0830 02/16/15 0837 02/17/15 0615  WBC 8.0  --  6.8  HGB 14.1 13.9 12.0  HCT 39.0 41.0 32.9*  PLT 194  --  178  Coag's  Recent Labs Lab 02/16/15 0830  APTT 36  INR 1.37    Sepsis Markers  Recent Labs Lab 02/16/15 0838  LATICACIDVEN 0.98   ABG No results for input(s): PHART, PCO2ART, PO2ART in the last 168 hours.  Liver Enzymes  Recent Labs Lab 02/16/15 0830  AST 21  ALT 17  ALKPHOS 83  BILITOT 0.9  ALBUMIN 3.2*    Cardiac Enzymes No results for input(s): TROPONINI, PROBNP in the last 168 hours.  Glucose  Recent Labs Lab 02/16/15 1607 02/16/15 2135 02/17/15 0904  GLUCAP 110* 98 92    Imaging Ct Head Wo Contrast  02/16/2015  CLINICAL DATA:  Stroke. Occluded left MCA status post revascularization. EXAM: CT  HEAD WITHOUT CONTRAST TECHNIQUE: Contiguous axial images were obtained from the base of the skull through the vertex without intravenous contrast. COMPARISON:  Head CT and cerebral perfusion earlier today FINDINGS: No acute intracranial hemorrhage is identified. There is residual intravascular contrast from recent catheter angiogram. Developing cytotoxic edema is present in the left MCA territory corresponding to the core infarct described on earlier perfusion study, most notable in the region of the insula, operculum, and lentiform nucleus. There is no midline shift or extra-axial fluid collection. Mild age-appropriate cerebral atrophy is noted. Prior left cataract extraction is noted. An air-fluid level is again seen in the left maxillary sinus with opacification of multiple anterior ethmoid air cells bilaterally. Mastoid air cells are clear. Fluid in the nasopharynx. IMPRESSION: 1. No acute intracranial hemorrhage. 2. Evolving acute left MCA infarct. Electronically Signed   By: Logan Bores M.D.   On: 02/16/2015 12:40   Mr Brain Wo Contrast  02/17/2015  CLINICAL DATA:  Follow-up LEFT MCA infarct, status post intervention. EXAM: MRI HEAD WITHOUT CONTRAST MRA HEAD WITHOUT CONTRAST TECHNIQUE: Multiplanar, multiecho pulse sequences of the brain and surrounding structures were obtained without intravenous contrast. Angiographic images of the head were obtained using MRA technique without contrast. COMPARISON:  CT head February 16, 2015 FINDINGS: MRI HEAD FINDINGS Reduced diffusion within LEFT basal ganglia, faint reduced diffusion LEFT frontal cortex and insula, punctate foci of reduced diffusion LEFT mesial temporal lobe. Areas demonstrate low ADC values. No susceptibility artifact to suggest hemorrhage. No midline shift, mass effect or mass lesions. Ventricles and sulci are normal for patient's age. Scattered supratentorial and pontine white matter subcentimeter T2 hyperintensities compatible with mild chronic  small vessel ischemic disease, less than expected for age. Old small LEFT cerebellar infarct. No abnormal extra-axial fluid collections. Status post LEFT ocular lens implant. Moderate to severe paranasal sinusitis with life-support lines in place. Mastoid air cells are well aerated. No abnormal sellar expansion. No cerebellar tonsillar ectopia. No suspicious calvarial bone marrow signal. MRA HEAD FINDINGS Anterior circulation: Normal flow related enhancement of the included cervical, petrous, cavernous and supraclinoid internal carotid arteries. Patent anterior communicating artery. Normal flow related enhancement of the anterior and middle cerebral arteries, including distal segments. Supernumerary anterior cerebral artery arising from RIGHT A1-2 junction. No large vessel occlusion, high-grade stenosis, abnormal luminal irregularity, aneurysm. Posterior circulation: Codominant vertebral arteries. Basilar artery is patent, with normal flow related enhancement of the main branch vessels. Normal flow related enhancement of the posterior cerebral arteries. No large vessel occlusion, high-grade stenosis, abnormal luminal irregularity, aneurysm. IMPRESSION: MRI HEAD: Patchy areas of acute ischemia within LEFT MCA territory, no hemorrhagic conversion. Mild chronic small vessel ischemic disease. Old small LEFT cerebellar infarct. MRA HEAD: Negative, widely patent LEFT MCA. Electronically Signed   By: Thana Farr.D.  On: 02/17/2015 05:47   Dg Chest Port 1 View  02/16/2015  CLINICAL DATA:  Check endotracheal tube placement EXAM: PORTABLE CHEST - 1 VIEW COMPARISON:  None. FINDINGS: Cardiac shadow is within normal limits. The endotracheal tube is noted 4.2 cm above the carina. A nasogastric catheter is seen with the tip in the mid esophagus at the level of the aortic arch. This could be advanced further into the stomach. The lungs are well aerated. Some increased density is noted in the medial aspect of the right  lung base likely related to right lower lobe atelectasis/infiltrate. No bony abnormality is noted. IMPRESSION: Endotracheal tube in satisfactory position. Nasogastric catheter within the mid esophagus and can be advanced into the stomach. Increased density in the medial right lung base projecting in the right lower lobe. Electronically Signed   By: Inez Catalina M.D.   On: 02/16/2015 19:43   Dg Abd Portable 1v  02/16/2015  CLINICAL DATA:  OG tube placement EXAM: PORTABLE ABDOMEN - 1 VIEW COMPARISON:  Chest radiograph-earlier same day FINDINGS: An enteric tube is not identified. Paucity of bowel gas without evidence of obstruction. No supine evidence of pneumoperitoneum. There is mild elevation of the right hemidiaphragm with associated right medial basilar heterogeneous opacities. No definitive abnormal intra-abdominal calcifications. The right femoral approach vascular catheter overlies the right SI joint. No acute osseus abnormalities. IMPRESSION: 1. Nonvisualization of an enteric tube (The enteric tube tip overlies expected location of the superior aspect of the esophagus on the corresponding chest radiograph). 2. Paucity of bowel gas without evidence of obstruction. Electronically Signed   By: Sandi Mariscal M.D.   On: 02/16/2015 19:45   Mr Jodene Nam Head/brain Wo Cm  02/17/2015  CLINICAL DATA:  Follow-up LEFT MCA infarct, status post intervention. EXAM: MRI HEAD WITHOUT CONTRAST MRA HEAD WITHOUT CONTRAST TECHNIQUE: Multiplanar, multiecho pulse sequences of the brain and surrounding structures were obtained without intravenous contrast. Angiographic images of the head were obtained using MRA technique without contrast. COMPARISON:  CT head February 16, 2015 FINDINGS: MRI HEAD FINDINGS Reduced diffusion within LEFT basal ganglia, faint reduced diffusion LEFT frontal cortex and insula, punctate foci of reduced diffusion LEFT mesial temporal lobe. Areas demonstrate low ADC values. No susceptibility artifact to  suggest hemorrhage. No midline shift, mass effect or mass lesions. Ventricles and sulci are normal for patient's age. Scattered supratentorial and pontine white matter subcentimeter T2 hyperintensities compatible with mild chronic small vessel ischemic disease, less than expected for age. Old small LEFT cerebellar infarct. No abnormal extra-axial fluid collections. Status post LEFT ocular lens implant. Moderate to severe paranasal sinusitis with life-support lines in place. Mastoid air cells are well aerated. No abnormal sellar expansion. No cerebellar tonsillar ectopia. No suspicious calvarial bone marrow signal. MRA HEAD FINDINGS Anterior circulation: Normal flow related enhancement of the included cervical, petrous, cavernous and supraclinoid internal carotid arteries. Patent anterior communicating artery. Normal flow related enhancement of the anterior and middle cerebral arteries, including distal segments. Supernumerary anterior cerebral artery arising from RIGHT A1-2 junction. No large vessel occlusion, high-grade stenosis, abnormal luminal irregularity, aneurysm. Posterior circulation: Codominant vertebral arteries. Basilar artery is patent, with normal flow related enhancement of the main branch vessels. Normal flow related enhancement of the posterior cerebral arteries. No large vessel occlusion, high-grade stenosis, abnormal luminal irregularity, aneurysm. IMPRESSION: MRI HEAD: Patchy areas of acute ischemia within LEFT MCA territory, no hemorrhagic conversion. Mild chronic small vessel ischemic disease. Old small LEFT cerebellar infarct. MRA HEAD: Negative, widely patent LEFT MCA. Electronically Signed  By: Elon Alas M.D.   On: 02/17/2015 05:47     STUDIES:  CT as noted  CULTURES:   ANTIBIOTICS: none  SIGNIFICANT EVENTS: 12/19 stroke  LINES/TUBES: 12/20 OTT>>  DISCUSSION: 69 yo with hx of TIA, on pradaxa for PAF/Flutter, last seen intact 12/19 2300 and found 12/20 7 a with  left sided weakness. CTA with rt event. Going to IR for intervention and PCCM asked to manage the vent.Currently in IR and in no acute distress. Being intubated for procedure.  ASSESSMENT / PLAN:  PULMONARY A: Intubated for IR procedure P:   SBT to extubate today one hour after the sheaths are out. Wean per neuro input Titrate O2 for sat of 88-92%. Ambulate once safe to do so.  CARDIOVASCULAR A:  Hx of PAF/Flutter followed by [facility]. On pradaxa  P:  Stop anticoagulation Restart when neurology deems safe. 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  RENAL Lab Results  Component Value Date   CREATININE 0.72 02/17/2015   CREATININE 0.60 02/16/2015   CREATININE 0.73 02/16/2015   A:   No acute issue P:   BMET in AM. Replace electrolytes as indicated.  GASTROINTESTINAL A:   GI protection P:   PPI Swallow evaluation post extubation. NPO for now.  HEMATOLOGIC Lab Results  Component Value Date   INR 1.37 02/16/2015   INR 1.3 01/28/2010   INR 2.6 12/22/2009   Recent Labs  02/16/15 0837 02/17/15 0615  HGB 13.9 12.0   A:   On pradaxa for PAF/Flutter  Factor 8 excess. P:  Stop anticoagulation Defer restart to neurology  INFECTIOUS A:   No evidence of infection P:   Monitor off abx. Follow WBC and fever curve.  ENDOCRINE A:   No acute issue P:   Follow labs  NEUROLOGIC A:   Stroke with intervention 12/20, has done very well from that standpoint. Left sided weakness. Last seen intact 2300 02/15/15 P:   RASS goal: -1 D/C sedation for extubation today.  FAMILY  - Updates: Family updated bedside.  The patient is critically ill with multiple organ systems failure and requires high complexity decision making for assessment and support, frequent evaluation and titration of therapies, application of advanced monitoring technologies and extensive interpretation of multiple databases.   Critical Care Time devoted to  patient care services described in this note is  35  Minutes. This time reflects time of care of this signee Dr Jennet Maduro. This critical care time does not reflect procedure time, or teaching time or supervisory time of PA/NP/Med student/Med Resident etc but could involve care discussion time.  Rush Farmer, M.D. [facility] Pulmonary/Critical Care Medicine. Pager: [phone]. After hours pager: [phone].

## 2015-02-17 NOTE — Procedures (Signed)
Extubation Procedure Note  Patient Details:   Name: Kendra [name] DOB: January 30, 1946 MRN: [id number]   Airway Documentation:  Airway 7.5 mm (Active)  Secured at (cm) 22 cm 02/17/2015 11:26 AM  Measured From Lips 02/17/2015 11:26 AM  Secured Location Left 02/17/2015 11:26 AM  Secured By [employer] 02/17/2015 11:26 AM  Tube Holder Repositioned Yes 02/17/2015 11:26 AM  Cuff Pressure (cm H2O) 26 cm H2O 02/16/2015  7:39 PM  Site Condition Dry 02/17/2015 11:26 AM    Evaluation  O2 sats: stable throughout Complications: No apparent complications Patient did tolerate procedure well. Bilateral Breath Sounds: Clear Suctioning: Airway Yes   Pt extubated to 4L [name].  No stridor noted.  RN at bedside.   [name] 02/17/2015, 2:18 PM

## 2015-02-17 NOTE — Progress Notes (Signed)
ANTICOAGULATION CONSULT NOTE - Initial Consult  Pharmacy Consult for apixaban Indication: atrial fibrillation  Allergies  Allergen Reactions  . Amiodarone Hcl     REACTION: Rash, nausea    Patient Measurements: Height: 5\' 7"  (170.2 cm) Weight: 147 lb 4.3 oz (66.8 kg) IBW/kg (Calculated) : 61.6   Vital Signs: Temp: 98.7 F (37.1 C) (12/21 1600) Temp Source: Axillary (12/21 1200) BP: 146/61 mmHg (12/21 1700) Pulse Rate: 84 (12/21 1700)  Labs:  Recent Labs  02/16/15 0830 02/16/15 0837 02/17/15 0615  HGB 14.1 13.9 12.0  HCT 39.0 41.0 32.9*  PLT 194  --  178  APTT 36  --   --   LABPROT 17.0*  --   --   INR 1.37  --   --   CREATININE 0.73 0.60 0.72    Estimated Creatinine Clearance: 63.6 mL/min (by C-G formula based on Cr of 0.72).   Medical History: Past Medical History  Diagnosis Date  . Atrial flutter ([place])   . Atrial fibrillation ([facility])     chronic anticoag  . Arthus phenomenon   . Hypercholesterolemia     Pure  . GERD (gastroesophageal reflux disease)   . Kidney infarction [facility])   . TIA (transient ischemic attack)     as per 07/30/00 note from [place]   . Hx of blood clots   . Hemorrhoids    Assessment: 69 yo with hx of TIA, on pradaxa for PAF/Flutter, found 12/20 with left sided weakness. Went to [employer] for [employer] device and [place] integrilin.   New orders to restart apixaban tonight. She does not meet any criteria for dose adjustments.   Goal of Therapy:  Appropriate anticagulation Monitor platelets by anticoagulation protocol: Yes   Plan:  Apixaban 5mg  bid - start tonight Will provide education once out of ICU  Erin Hearing PharmD., BCPS Clinical Pharmacist Pager [phone] 02/17/2015 5:55 PM

## 2015-02-17 NOTE — Progress Notes (Signed)
STROKE TEAM PROGRESS NOTE   HISTORY Kendra [name] is an 69 y.o. female with a hx of factor 8 deficiency and A flutter on pradaxa who was last seen normal at 11pm last night 02/15/2015 and woke up this am 02/16/2015 at 8am not speaking and moving her R side too well. CTH shows dense L MCA sign and perfusion study shows mismatch. Patient was not administered TPA secondary to unknown time of onset. She was sent to IR under the wakeup stroke protocol where she received bilateral common carotid arteriograms followed by complete angiographicrevascularization of occluded LT MCA using x 1 pass with 29mm x 40 mm Solitaire retrieval device and approx 5 mg of superselective IA integrelin. TICI 3 revascularization  She was admitted to the neuro ICU for further evaluation and treatment.   SUBJECTIVE (INTERVAL HISTORY) Her family is at the bedside.  They are concerned about her. Patient is easily arousable and following commands and moving all 4 extremities. Blood pressure has been adequately controlled.   OBJECTIVE Temp:  [97.2 F (36.2 C)-100.2 F (37.9 C)] 99.5 F (37.5 C) (12/21 0400) Pulse Rate:  [62-81] 75 (12/21 0742) Cardiac Rhythm:  [-] Normal sinus rhythm (12/20 2000) Resp:  [14-24] 16 (12/21 0742) BP: (95-163)/(49-89) 133/60 mmHg (12/21 0742) SpO2:  [97 %-100 %] 100 % (12/21 0742) Arterial Line BP: (105-156)/(47-59) 156/57 mmHg (12/21 0700) FiO2 (%):  [40 %] 40 % (12/21 0742) Weight:  [66.8 kg (147 lb 4.3 oz)] 66.8 kg (147 lb 4.3 oz) (12/20 1300)  CBC:   Recent Labs Lab 02/16/15 0830 02/16/15 0837 02/17/15 0615  WBC 8.0  --  6.8  NEUTROABS 5.5  --  5.0  HGB 14.1 13.9 12.0  HCT 39.0 41.0 32.9*  MCV 86.7  --  87.5  PLT 194  --  0000000    Basic Metabolic Panel:   Recent Labs Lab 02/16/15 0830 02/16/15 0837 02/17/15 0615  NA 137 139 139  K 4.0 3.9 3.8  CL 104 104 109  CO2 22  --  20*  GLUCOSE 121* 115* 102*  BUN 7 7 8   CREATININE 0.73 0.60 0.72  CALCIUM 9.2  --  8.5*     Lipid Panel:     Component Value Date/Time   CHOL 121 02/17/2015 0615   TRIG 118 02/17/2015 0615   HDL 30* 02/17/2015 0615   CHOLHDL 4.0 02/17/2015 0615   VLDL 24 02/17/2015 0615   LDLCALC 67 02/17/2015 0615   HgbA1c: No results found for: HGBA1C Urine Drug Screen:     Component Value Date/Time   LABOPIA NONE DETECTED 02/16/2015 1011   COCAINSCRNUR NONE DETECTED 02/16/2015 1011   LABBENZ NONE DETECTED 02/16/2015 1011   AMPHETMU NONE DETECTED 02/16/2015 1011   THCU NONE DETECTED 02/16/2015 1011   LABBARB NONE DETECTED 02/16/2015 1011      IMAGING  Ct Head Wo Contrast 02/16/2015   1. No acute intracranial hemorrhage. 2. Evolving acute left MCA infarct.  02/16/2015  No acute intracranial abnormality. Sinusitis with air-fluid level.   Ct Cerebral Perfusion W/cm 02/16/2015   Core infarction measuring up to 8 cm in diameter in the left middle cerebral artery territory affecting the left temporal lobe, insula, basal ganglia and frontoparietal deep white matter. Fairly sizable penumbra around the margins of the core infarction.   Dg Chest Port 1 View 02/16/2015  Endotracheal tube in satisfactory position. Nasogastric catheter within the mid esophagus and can be advanced into the stomach. Increased density in the medial right lung base  projecting in the right lower lobe.   MRI HEAD 02/17/2015  : Patchy areas of acute ischemia within LEFT MCA territory, no hemorrhagic conversion. Mild chronic small vessel ischemic disease. Old small LEFT cerebellar infarct.   MRA HEAD 02/17/2015  : Negative, widely patent LEFT MCA.       PHYSICAL EXAM Elderly Caucasian lady who is sedated, intubated, right groin arterial sheath. . Afebrile. Head is nontraumatic. Neck is supple without bruit.    Cardiac exam no murmur or gallop. Lungs are clear to auscultation. Distal pulses are well felt. Neurological Exam :  Drowsy but can be aroused. Opens eyes partially. Not following gaze. Pupils equal  reactive. Fundi were not visualized. Following simple midline commands and moving extremities purposefully against gravity to commands. Suspect mild right leg weakness. Deep tendon reflexes are symmetric. Plantars are downgoing. Withdraws to painful stimuli bilaterally. ASSESSMENT/PLAN Kendra [name] is a 69 y.o. female with history of elevated factor 8  and atrial fibrillation who woke with aphasia and R hemiparesis. She did not receive IV t-PA due to unknown time last known well/wake up stroke. TICI 3 revascularization of occluded LT MCA using mechanical thrombectomy and IA integrelin.  Stroke:  Dominant patchy left MCA infarct embolic secondary to known atrial fibrillation s/p TICI 3 revascularization with Solitaire and IA integrilin.  Resultant  Neuro deficits improved  CT perfusion core infarct L temporal lobe, insula, basal ganglia and frontoparietal DWM, sizeable penumbra  MRI  patchy left MCA infarct   MRA Unremarkable, patent L MCA   2D Echo  pending   Check LE venous dopplers  LDL 67  HgbA1c pending  SCDs for VTE prophylaxis Diet NPO time specified  Pradaxa (dabigatran) twice a day prior to admission, now on No antithrombotic. Add aspirin suppository 300 mg daily  Ongoing aggressive stroke risk factor management  For sheath removal today  Therapy recommendations:  pending   Disposition:  pending   Respiratory Failure  Intubated for neurointervention  Remains intubated today  Ok to extubate if ok with IR  CCM following  Atrial Fibrillation  Home anticoagulation:  pradaxa   Other Stroke Risk Factors  Advanced age  Hx stroke/TIA - TIA 07/2000  Factor 8 deficiency  Known PFO  Other Active Problems  Hospital day # [place]acolt [place] for Pager information 02/17/2015 9:10 AM  I have personally examined this patient, reviewed notes, independently viewed imaging studies, participated in medical decision making and  plan of care. I have made any additions or clarifications directly to the above note. Agree with note above. She presented with aphasia and right hemiparesis due to terminal left ICA occlusion from likely atrial fibrillation versus hypercoagulability from elevated factor VIII levels despite anticoagulation with Pradaxa. She underwent mechanical embolectomy with good revascularization but remains at risk for neurological worsening, hemorrhagic transformation and recurrent strokes. Plan to DC groin sheath and then extubated has tolerated. She will need to be started on anticoagulation within 48 hours of last dose of Pradaxa to prevent recurrent thromboembolism. I had a long discussion with the patient's husband, daughter at the bedside regarding her emesis, clinical course, diagnostic workup and answered questions. May need to consider changing products are to alternative long-term anticoagulant like eliquis given its failure to prevent clots. Family understands and is in agreement. I discussed this with patient's cardiologist Dr. Crissie Sickles who is in agreement with the plan This patient is critically ill and at significant risk of neurological worsening,  death and care requires constant monitoring of vital signs, hemodynamics,respiratory and cardiac monitoring, extensive review of multiple databases, frequent neurological assessment, discussion with family, other specialists and medical decision making of high complexity.I have made any additions or clarifications directly to the above note.This critical care time does not reflect procedure time, or teaching time or supervisory time of PA/NP/Med Resident etc but could involve care discussion time.  I spent 90 minutes of neurocritical care time  in the care of  this patient.      Antony Contras, MD Medical Director [facility] Pager: [phone] 02/17/2015 7:40 PM    To contact Stroke Continuity provider, please refer to [email or web address] After  hours, contact General Neurology

## 2015-02-17 NOTE — Progress Notes (Signed)
OT Cancellation Note  Patient Details Name: Kendra [name] MRN: [id number] DOB: Aug 08, 1945   Cancelled Treatment:    Reason Eval/Treat Not Completed: Patient not medically ready (bedrest) OT order received and appreciated however this conflicts with current bedrest order set. Please increase activity tolerance as appropriate and remove bedrest from orders. . Please contact OT at [phone] if bed rest order is discontinued. OT will hold evaluation at this time and will check back as time allows pending increased activity orders.   Parke Poisson B 02/17/2015, 9:22 AM

## 2015-02-17 NOTE — Progress Notes (Signed)
    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 Pt doing well this morning. Awake and alert, though still intubated. Able to follow commands Moving (R)UE and LE ext D/w Dr. Leonie Man  Objective: Physical Exam: BP 133/60 mmHg  Pulse 75  Temp(Src) 98.9 F (37.2 C) (Axillary)  Resp 16  Ht 5\' 7"  (1.702 m)  Wt 147 lb 4.3 oz (66.8 kg)  BMI 23.06 kg/m2  SpO2 100% Awake, alert, intubated Moves right UE and LE on command Ext: Bilat groins soft, sheaths intact    Labs: CBC  Recent Labs  02/16/15 0830 02/16/15 0837 02/17/15 0615  WBC 8.0  --  6.8  HGB 14.1 13.9 12.0  HCT 39.0 41.0 32.9*  PLT 194  --  178   BMET  Recent Labs  02/16/15 0830 02/16/15 0837 02/17/15 0615  NA 137 139 139  K 4.0 3.9 3.8  CL 104 104 109  CO2 22  --  20*  GLUCOSE 121* 115* 102*  BUN 7 7 8   CREATININE 0.73 0.60 0.72  CALCIUM 9.2  --  8.5*   LFT  Recent Labs  02/16/15 0830  PROT 6.2*  ALBUMIN 3.2*  AST 21  ALT 17  ALKPHOS 83  BILITOT 0.9   PT/INR  Recent Labs  02/16/15 0830  LABPROT 17.0*  INR 1.37     Assessment/Plan: S/p Revascularization of occluded LT MCA using x 1 pass with 38mm x 40 mm Solitaire retrieval device and approx 5 mg of superselective IA integrelin D/w Dr. Estanislado Pandy, ok to remove sheaths More complete neuro exam once extubated   LOS: 1 day    Ascencion Dike PA-C 02/17/2015 9:21 AM

## 2015-02-17 NOTE — Progress Notes (Signed)
  Echocardiogram 2D Echocardiogram has been performed.  Kendra [name] 02/17/2015, 4:32 PM

## 2015-02-18 ENCOUNTER — Inpatient Hospital Stay (HOSPITAL_COMMUNITY): Payer: 59

## 2015-02-18 LAB — BASIC METABOLIC PANEL
Anion gap: 9 (ref 5–15)
BUN: 6 mg/dL (ref 6–20)
CALCIUM: 7.9 mg/dL — AB (ref 8.9–10.3)
CHLORIDE: 104 mmol/L (ref 101–111)
CO2: 19 mmol/L — ABNORMAL LOW (ref 22–32)
Creatinine, Ser: 0.62 mg/dL (ref 0.44–1.00)
GFR calc non Af Amer: >60 mL/min (ref >60)
Glucose, Bld: 108 mg/dL — ABNORMAL HIGH (ref 65–99)
POTASSIUM: 3.8 mmol/L (ref 3.5–5.1)
SODIUM: 132 mmol/L — AB (ref 135–145)

## 2015-02-18 LAB — GLUCOSE, CAPILLARY
GLUCOSE-CAPILLARY: 101 mg/dL — AB (ref 65–99)
Glucose-Capillary: 84 mg/dL (ref 65–99)
Glucose-Capillary: 96 mg/dL (ref 65–99)

## 2015-02-18 LAB — CBC
HCT: 33.4 % — ABNORMAL LOW (ref 36.0–46.0)
HEMOGLOBIN: 12.2 g/dL (ref 12.0–15.0)
MCH: 31.8 pg (ref 26.0–34.0)
MCHC: 36.5 g/dL — AB (ref 30.0–36.0)
MCV: 87 fL (ref 78.0–100.0)
Platelets: 186 10*3/uL (ref 150–400)
RBC: 3.84 MIL/uL — ABNORMAL LOW (ref 3.87–5.11)
RDW: 11.8 % (ref 11.5–15.5)
WBC: 8.1 10*3/uL (ref 4.0–10.5)

## 2015-02-18 LAB — HEMOGLOBIN A1C
Hgb A1c MFr Bld: 5.2 % (ref 4.8–5.6)
Mean Plasma Glucose: 103 mg/dL

## 2015-02-18 LAB — MAGNESIUM: MAGNESIUM: 1.6 mg/dL — AB (ref 1.7–2.4)

## 2015-02-18 LAB — PHOSPHORUS: Phosphorus: 2.9 mg/dL (ref 2.5–4.6)

## 2015-02-18 NOTE — Evaluation (Signed)
Physical Therapy Evaluation Patient Details Name: Kendra [name] MRN: [id number] DOB: 1945/05/12 Today's Date: 02/18/2015   History of Present Illness  pt presents with L MCA Infarct now s/p Revascularization.  pt with hx of A-fib and TIA.    Clinical Impression  Pt very motivated and attempts all things PT asks despite being fatigued.  Feel pt would be a great candidate for a short term CIR stay to maximize independence and decrease burden of care.  Will continue to follow.      Follow Up Recommendations CIR    Equipment Recommendations  None recommended by PT    Recommendations for Other Services Rehab consult     Precautions / Restrictions Precautions Precautions: Fall Restrictions Weight Bearing Restrictions: No      Mobility  Bed Mobility Overal bed mobility: Needs Assistance Bed Mobility: Supine to Sit     Supine to sit: Min assist     General bed mobility comments: pt needs increased time and MinA for bringing trunk up to sitting.    Transfers Overall transfer level: Needs assistance Equipment used: 1 person hand held assist Transfers: Sit to/from Stand Sit to Stand: Min assist         General transfer comment: pt mildly unsteady and indicates dizziness, but resolves.    Ambulation/Gait Ambulation/Gait assistance: Min assist Ambulation Distance (Feet): 80 Feet Assistive device: 1 person hand held assist Gait Pattern/deviations: Step-through pattern;Decreased stride length;Ataxic     General Gait Details: pt mildly ataxic during ambulation and requires A for balance, especially when attempting to multi-task.    Stairs            Wheelchair Mobility    Modified Rankin (Stroke Patients Only) Modified Rankin (Stroke Patients Only) Pre-Morbid Rankin Score: No symptoms Modified Rankin: Moderately severe disability     Balance Overall balance assessment: Needs assistance Sitting-balance support: No upper extremity supported;Feet  supported Sitting balance-Leahy Scale: Fair Sitting balance - Comments: Difficulties with dynamic balance and indicates dizziness.     Standing balance support: No upper extremity supported;During functional activity Standing balance-Leahy Scale: Fair                               Pertinent Vitals/Pain Pain Assessment: No/denies pain    Home Living Family/patient expects to be discharged to:: Private residence Living Arrangements: Spouse/significant other Available Help at Discharge: Family;Available 24 hours/day Type of Home: House Home Access: Stairs to enter Entrance Stairs-Rails: Left Entrance Stairs-Number of Steps: 5 and a landing Home Layout: Two level;Able to live on main level with bedroom/bathroom Home Equipment: None      Prior Function Level of Independence: Independent               Hand Dominance   Dominant Hand: Left    Extremity/Trunk Assessment   Upper Extremity Assessment: Defer to OT evaluation           Lower Extremity Assessment: RLE deficits/detail;LLE deficits/detail RLE Deficits / Details: Strength intact, but decreased coordination.   LLE Deficits / Details: Strength intact, but decreased coordination.    Cervical / Trunk Assessment: Normal  Communication   Communication: Expressive difficulties  Cognition Arousal/Alertness: Awake/alert Behavior During Therapy: WFL for tasks assessed/performed Overall Cognitive Status: Difficult to assess                      General Comments      Exercises  Assessment/Plan    PT Assessment Patient needs continued PT services  PT Diagnosis Abnormality of gait   PT Problem List Decreased activity tolerance;Decreased balance;Decreased mobility;Decreased coordination;Decreased knowledge of use of DME  PT Treatment Interventions DME instruction;Gait training;Stair training;Functional mobility training;Therapeutic activities;Therapeutic exercise;Balance  training;Neuromuscular re-education;Patient/family education   PT Goals (Current goals can be found in the Care Plan section) Acute Rehab PT Goals Patient Stated Goal: Per family to go to rehab then home.   PT Goal Formulation: With patient Time For Goal Achievement: 03/04/15 Potential to Achieve Goals: Good    Frequency Min 4X/week   Barriers to discharge        Co-evaluation               End of Session Equipment Utilized During Treatment: Gait belt Activity Tolerance: Patient tolerated treatment well Patient left: in chair;with call bell/phone within reach;with family/visitor present Nurse Communication: Mobility status         Time: 1022-1055 PT Time Calculation (min) (ACUTE ONLY): 33 min   Charges:   PT Evaluation $Initial PT Evaluation Tier I: 1 Procedure PT Treatments $Gait Training: 8-22 mins   PT G CodesCatarina Hartshorn, [place] 02/18/2015, 11:36 AM

## 2015-02-18 NOTE — Progress Notes (Signed)
PULMONARY / CRITICAL CARE MEDICINE   Name: Kendra [name] MRN: [id number] DOB: 07-29-1945    ADMISSION DATE:  02/16/2015 CONSULTATION DATE:  12/20  REFERRING MD:  Kendra [name]  CHIEF COMPLAINT:  AMS, left side weakness  HISTORY OF PRESENT ILLNESS:   69 yo with hx of TIA, on pradaxa for PAF/Flutter, last seen intact 12/19 2300 and found 12/20 7 a with left sided weakness. CTA with rt event. Going to IR for intervention and PCCM asked to manage the vent.Currently in IR and in no acute distress. Being intubated for procedure.  PAST MEDICAL HISTORY :  She  has a past medical history of Atrial flutter ([place]); Atrial fibrillation ([place]); Arthus phenomenon; Hypercholesterolemia; GERD (gastroesophageal reflux disease); Kidney infarction [facility]); TIA (transient ischemic attack); blood clots; and Hemorrhoids.  PAST SURGICAL HISTORY: She  has past surgical history that includes Breast biopsy (Right); Appendectomy; fibroid tumor removal (1996); Cardiac electrophysiology study and ablation; and Radiology with anesthesia (N/A, 02/16/2015).  Allergies  Allergen Reactions  . Amiodarone Hcl     REACTION: Rash, nausea    No current facility-administered medications on file prior to encounter.   Current Outpatient Prescriptions on File Prior to Encounter  Medication Sig  . dabigatran (PRADAXA) 150 MG CAPS capsule Take 1 capsule (150 mg total) by mouth 2 (two) times daily.  . metoprolol succinate (TOPROL-XL) 100 MG 24 hr tablet TAKE ONE TABLET BY MOUTH ONCE DAILY WITH A MEAL OR IMMEDIATELY FOLLOWING A MEAL.  . Multiple Vitamin (MULTIVITAMIN) tablet Take 1 tablet by mouth daily.    . Omega-3 Fatty Acids (FISH OIL) 1000 MG CAPS Take 1 capsule by mouth daily.      FAMILY HISTORY:  Her indicated that her mother is deceased.   SOCIAL HISTORY: She  reports that she has never smoked. She has never used smokeless tobacco. She reports that she does not drink alcohol or use illicit drugs.  REVIEW OF SYSTEMS:    na  SUBJECTIVE:    VITAL SIGNS: BP 137/63 mmHg  Pulse 80  Temp(Src) 97.1 F (36.2 C) (Oral)  Resp 19  Ht 5\' 7"  (1.702 m)  Wt 66.8 kg (147 lb 4.3 oz)  BMI 23.06 kg/m2  SpO2 97%  HEMODYNAMICS:    VENTILATOR SETTINGS: Vent Mode:  [-] CPAP;PSV FiO2 (%):  [4 %-40 %] 4 % PEEP:  [5 cmH20] 5 cmH20 Pressure Support:  [5 cmH20] 5 cmH20  INTAKE / OUTPUT: I/O last 3 completed shifts: In: 3013.5 [I.V.:3013.5] Out: 1620 [Urine:1620]  PHYSICAL EXAMINATION: General:  Awake and following command, difficulty with word finding. Neuro: Moving all ext to command. HEENT:  [place]/AT, PERRL, EOM-I and MMM. Cardiovascular:  RRR, Nl S1/S2, -M/R/G. Lungs:  CTA bilaterally. Abdomen:  Soft, ND, ND and +BS. Musculoskeletal:  -edema and -tenderness. Skin:  Warm and dry  LABS:  BMET  Recent Labs Lab 02/16/15 0830 02/16/15 0837 02/17/15 0615 02/18/15 0227  NA 137 139 139 132*  K 4.0 3.9 3.8 3.8  CL 104 104 109 104  CO2 22  --  20* 19*  BUN 7 7 8 6   CREATININE 0.73 0.60 0.72 0.62  GLUCOSE 121* 115* 102* 108*   Electrolytes  Recent Labs Lab 02/16/15 0830 02/17/15 0615 02/18/15 0227  CALCIUM 9.2 8.5* 7.9*  MG  --   --  1.6*  PHOS  --   --  2.9   CBC  Recent Labs Lab 02/16/15 0830 02/16/15 0837 02/17/15 0615 02/18/15 0227  WBC 8.0  --  6.8 8.1  HGB 14.1 13.9 12.0 12.2  HCT 39.0 41.0 32.9* 33.4*  PLT 194  --  178 186   Coag's  Recent Labs Lab 02/16/15 0830  APTT 36  INR 1.37    Sepsis Markers  Recent Labs Lab 02/16/15 0838  LATICACIDVEN 0.98   ABG No results for input(s): PHART, PCO2ART, PO2ART in the last 168 hours.  Liver Enzymes  Recent Labs Lab 02/16/15 0830  AST 21  ALT 17  ALKPHOS 83  BILITOT 0.9  ALBUMIN 3.2*    Cardiac Enzymes No results for input(s): TROPONINI, PROBNP in the last 168 hours.  Glucose  Recent Labs Lab 02/16/15 2135 02/17/15 0904 02/17/15 1224 02/17/15 1801 02/17/15 2136 02/18/15 0811  GLUCAP 98 92 89 95 91  84    Imaging No results found.   STUDIES:  CT as noted  CULTURES:   ANTIBIOTICS: none  SIGNIFICANT EVENTS: 12/19 stroke  LINES/TUBES: 12/20 OTT>>12/21  I reviewed CXR myself, elevated right hemidiaphragm noted, not quiet infiltrate.  DISCUSSION: 69 yo with hx of TIA, on pradaxa for PAF/Flutter, last seen intact 12/19 2300 and found 12/20 7 a with left sided weakness. CTA with rt event. Going to IR for intervention and PCCM asked to manage the vent.Currently in IR and in no acute distress. Being intubated for procedure.  ASSESSMENT / PLAN:  PULMONARY A: Intubated for IR procedure P:   Titrate O2 for sat of 88-92%. Ambulate once safe to do so.  CARDIOVASCULAR A:  Hx of PAF/Flutter followed by [facility]. 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 Eliquis added.  RENAL Lab Results  Component Value Date   CREATININE 0.62 02/18/2015   CREATININE 0.72 02/17/2015   CREATININE 0.60 02/16/2015   A:   No acute issue P:   BMET in AM. Replace electrolytes as indicated. Recommend KVO of IVF but will defer to neuro.  GASTROINTESTINAL A:   GI protection P:   PPI Heart healthy diet.  HEMATOLOGIC Lab Results  Component Value Date   INR 1.37 02/16/2015   INR 1.3 01/28/2010   INR 2.6 12/22/2009    Recent Labs  02/17/15 0615 02/18/15 0227  HGB 12.0 12.2   A:   On pradaxa for PAF/Flutter  Factor 8 excess. P:  Restart eliquis.  INFECTIOUS A:   No evidence of infection, RLL "infiltrate" is due to elevated hemidiaphragm, that was explained to daughter who is a pediatric pulmonologist. P:   Monitor off abx. Follow WBC and fever curve.  ENDOCRINE A:   No acute issue P:   Follow labs.  NEUROLOGIC A:   Stroke with intervention 12/20, has done very well from that standpoint. Left sided weakness. Last seen intact 2300 02/15/15 P:   D/C sedating medications.  FAMILY  - Updates: Family updated  bedside.  Discussed with bedside RN, transfer to neuro floor, PCCM will sign off, please call back if needed.  Rush Farmer, M.D. [facility] Pulmonary/Critical Care Medicine. Pager: [phone]. After hours pager: [phone].

## 2015-02-18 NOTE — Progress Notes (Signed)
Pt arrived to 5M07 via wheelchair.  Alert and oriented, no apparent distress, no complaints of pain.  Family at bedside.  VSS.  Telemetry applied and CCMD notified.  Will continue to monitor. Cori Razor, RN

## 2015-02-18 NOTE — Progress Notes (Signed)
Rehab admissions - I met with husband, dtr and son and gave them rehab booklets.  I explained inpatient rehab.  I took them all on a tour of rehab unit.  I have opened the case with [facility] and have sent clinicals requesting acute inpatient rehab admission.  I will update all once I hear back from insurance carrier.  Call me for questions.  #[phone]

## 2015-02-18 NOTE — Progress Notes (Signed)
VASCULAR LAB PRELIMINARY  PRELIMINARY  PRELIMINARY  PRELIMINARY  Bilateral lower extremity venous duplex  completed.    ....Marland KitchenBilateral:  No evidence of DVT, superficial thrombosis, or Baker's Cyst.     Janifer Adie, RVT, RDMS 02/18/2015, 2:04 PM

## 2015-02-18 NOTE — Consult Note (Signed)
Rehab admissions - PT/OT evaluations and recommendations are pending.  I will follow up after evaluations are completed.  Call me for questions.  RC:[phone]

## 2015-02-18 NOTE — Consult Note (Signed)
Physical Medicine and Rehabilitation Consult Reason for Consult: Left MCA infarct Referring Physician: Dr. Leonie Man   HPI: Kendra [name] is a 69 y.o. right handed female with history of atrial fibrillation maintained on pradaxa, factor VIII deficiency. Presented 02/16/2015 with right-sided weakness and aphasia. MRI of the brain showed patchy areas of acute ischemia left MCA territory. MRA of the head negative. Patient did not receive TPA. Echocardiogram with ejection fraction of 60% no wall motion abnormalities. Underwent bilateral common carotid arteriograms followed by complete angiographic revascularization of occluded left MCA per interventional radiology. Neurology follow-up maintained on Eliquis. Regular consistency diet. Physical occupational therapy evaluations yet to be completed. M.D. has requested physical medicine rehabilitation consult.  Review of Systems  Constitutional: Negative for fever and chills.  HENT: Negative for hearing loss.   Eyes: Negative for blurred vision and double vision.  Respiratory: Negative for cough and shortness of breath.   Cardiovascular: Positive for palpitations. Negative for chest pain and leg swelling.  Gastrointestinal: Positive for constipation. Negative for nausea and vomiting.       GERD  Genitourinary: Negative for dysuria and hematuria.  Musculoskeletal: Positive for myalgias.  Skin: Negative for rash.  Neurological: Positive for weakness and headaches. Negative for seizures and loss of consciousness.   Past Medical History  Diagnosis Date  . Atrial flutter ([place])   . Atrial fibrillation (HCC)     chronic anticoag  . Arthus phenomenon   . Hypercholesterolemia     Pure  . GERD (gastroesophageal reflux disease)   . Kidney infarction [facility])   . TIA (transient ischemic attack)     as per 07/30/00 note from [place]   . Hx of blood clots   . Hemorrhoids    Past Surgical History  Procedure Laterality Date  . Breast biopsy Right   .  Appendectomy    . Fibroid tumor removal  1996  . Cardiac electrophysiology study and ablation    . Radiology with anesthesia N/A 02/16/2015    Procedure: RADIOLOGY WITH ANESTHESIA;  Surgeon: Medication Radiologist, MD;  Location: [facility];  Service: Radiology;  Laterality: N/A;   Family History  Problem Relation Age of Onset  . Alcohol abuse Father   . Lung cancer Father   . Colon cancer Neg Hx   . Other Brother     Emotional Illness   Social History:  reports that she has never smoked. She has never used smokeless tobacco. She reports that she does not drink alcohol or use illicit drugs. Allergies:  Allergies  Allergen Reactions  . Amiodarone Hcl     REACTION: Rash, nausea   Medications Prior to Admission  Medication Sig Dispense Refill  . dabigatran (PRADAXA) 150 MG CAPS capsule Take 1 capsule (150 mg total) by mouth 2 (two) times daily. 180 capsule 1  . metoprolol succinate (TOPROL-XL) 100 MG 24 hr tablet TAKE ONE TABLET BY MOUTH ONCE DAILY WITH A MEAL OR IMMEDIATELY FOLLOWING A MEAL. 90 tablet 3  . Multiple Vitamin (MULTIVITAMIN) tablet Take 1 tablet by mouth daily.      . Omega-3 Fatty Acids (FISH OIL) 1000 MG CAPS Take 1 capsule by mouth daily.        Home: Home Living Family/patient expects to be discharged to:: Private residence Living Arrangements: Spouse/significant other  Functional History:   Functional Status:  Mobility:          ADL:    Cognition: Cognition Orientation Level: Oriented X4    Blood pressure  137/63, pulse 80, temperature 97.1 F (36.2 C), temperature source Oral, resp. rate 19, height 5\' 7"  (1.702 m), weight 66.8 kg (147 lb 4.3 oz), SpO2 97 %. Physical Exam  Constitutional: She appears well-developed and well-nourished.  HENT:  Head: Normocephalic and atraumatic.  Eyes: Pupils are equal, round, and reactive to light.  Neck: Normal range of motion. No tracheal deviation present. No thyromegaly present.  Cardiovascular: Normal  rate and regular rhythm.   Respiratory: Effort normal. No respiratory distress. She has no wheezes.  GI: She exhibits no distension. There is no tenderness.  Musculoskeletal: She exhibits no edema.  Neurological: She is alert. No cranial nerve deficit. Coordination normal.  Pt alert. Follows simple commands with some delay. Oriented to month, day of week, needed cues for year. UE's grossly 4/5 prox to distal. LE: 4/5 hf, 4/5 ke and 4+ adf/apf. No gross sensory findings. CN notable for hoarse voice but no other focal findings.   Psychiatric: She has a normal mood and affect.    Results for orders placed or performed during the hospital encounter of 02/16/15 (from the past 24 hour(s))  Glucose, capillary     Status: None   Collection Time: 02/17/15  9:04 AM  Result Value Ref Range   Glucose-Capillary 92 65 - 99 mg/dL   Comment 1 Notify RN    Comment 2 Document in Chart   Glucose, capillary     Status: None   Collection Time: 02/17/15 12:24 PM  Result Value Ref Range   Glucose-Capillary 89 65 - 99 mg/dL   Comment 1 Notify RN    Comment 2 Document in Chart   Glucose, capillary     Status: None   Collection Time: 02/17/15  6:01 PM  Result Value Ref Range   Glucose-Capillary 95 65 - 99 mg/dL   Comment 1 Notify RN    Comment 2 Document in Chart   Glucose, capillary     Status: None   Collection Time: 02/17/15  9:36 PM  Result Value Ref Range   Glucose-Capillary 91 65 - 99 mg/dL  Basic metabolic panel     Status: Abnormal   Collection Time: 02/18/15  2:27 AM  Result Value Ref Range   Sodium 132 (L) 135 - 145 mmol/L   Potassium 3.8 3.5 - 5.1 mmol/L   Chloride 104 101 - 111 mmol/L   CO2 19 (L) 22 - 32 mmol/L   Glucose, Bld 108 (H) 65 - 99 mg/dL   BUN 6 6 - 20 mg/dL   Creatinine, Ser 0.62 0.44 - 1.00 mg/dL   Calcium 7.9 (L) 8.9 - 10.3 mg/dL   GFR calc non Af Amer >60 >60 mL/min   GFR calc Af Amer >60 >60 mL/min   Anion gap 9 5 - 15  CBC     Status: Abnormal   Collection Time:  02/18/15  2:27 AM  Result Value Ref Range   WBC 8.1 4.0 - 10.5 K/uL   RBC 3.84 (L) 3.87 - 5.11 MIL/uL   Hemoglobin 12.2 12.0 - 15.0 g/dL   HCT 33.4 (L) 36.0 - 46.0 %   MCV 87.0 78.0 - 100.0 fL   MCH 31.8 26.0 - 34.0 pg   MCHC 36.5 (H) 30.0 - 36.0 g/dL   RDW 11.8 11.5 - 15.5 %   Platelets 186 150 - 400 K/uL  Magnesium     Status: Abnormal   Collection Time: 02/18/15  2:27 AM  Result Value Ref Range   Magnesium 1.6 (L) 1.7 -  2.4 mg/dL  Phosphorus     Status: None   Collection Time: 02/18/15  2:27 AM  Result Value Ref Range   Phosphorus 2.9 2.5 - 4.6 mg/dL  Glucose, capillary     Status: None   Collection Time: 02/18/15  8:11 AM  Result Value Ref Range   Glucose-Capillary 84 65 - 99 mg/dL   Comment 1 Notify RN    Comment 2 Document in Chart    Ct Head Wo Contrast  02/16/2015  CLINICAL DATA:  Stroke. Occluded left MCA status post revascularization. EXAM: CT HEAD WITHOUT CONTRAST TECHNIQUE: Contiguous axial images were obtained from the base of the skull through the vertex without intravenous contrast. COMPARISON:  Head CT and cerebral perfusion earlier today FINDINGS: No acute intracranial hemorrhage is identified. There is residual intravascular contrast from recent catheter angiogram. Developing cytotoxic edema is present in the left MCA territory corresponding to the core infarct described on earlier perfusion study, most notable in the region of the insula, operculum, and lentiform nucleus. There is no midline shift or extra-axial fluid collection. Mild age-appropriate cerebral atrophy is noted. Prior left cataract extraction is noted. An air-fluid level is again seen in the left maxillary sinus with opacification of multiple anterior ethmoid air cells bilaterally. Mastoid air cells are clear. Fluid in the nasopharynx. IMPRESSION: 1. No acute intracranial hemorrhage. 2. Evolving acute left MCA infarct. Electronically Signed   By: Logan Bores M.D.   On: 02/16/2015 12:40   Mr Brain Wo  Contrast  02/17/2015  CLINICAL DATA:  Follow-up LEFT MCA infarct, status post intervention. EXAM: MRI HEAD WITHOUT CONTRAST MRA HEAD WITHOUT CONTRAST TECHNIQUE: Multiplanar, multiecho pulse sequences of the brain and surrounding structures were obtained without intravenous contrast. Angiographic images of the head were obtained using MRA technique without contrast. COMPARISON:  CT head February 16, 2015 FINDINGS: MRI HEAD FINDINGS Reduced diffusion within LEFT basal ganglia, faint reduced diffusion LEFT frontal cortex and insula, punctate foci of reduced diffusion LEFT mesial temporal lobe. Areas demonstrate low ADC values. No susceptibility artifact to suggest hemorrhage. No midline shift, mass effect or mass lesions. Ventricles and sulci are normal for patient's age. Scattered supratentorial and pontine white matter subcentimeter T2 hyperintensities compatible with mild chronic small vessel ischemic disease, less than expected for age. Old small LEFT cerebellar infarct. No abnormal extra-axial fluid collections. Status post LEFT ocular lens implant. Moderate to severe paranasal sinusitis with life-support lines in place. Mastoid air cells are well aerated. No abnormal sellar expansion. No cerebellar tonsillar ectopia. No suspicious calvarial bone marrow signal. MRA HEAD FINDINGS Anterior circulation: Normal flow related enhancement of the included cervical, petrous, cavernous and supraclinoid internal carotid arteries. Patent anterior communicating artery. Normal flow related enhancement of the anterior and middle cerebral arteries, including distal segments. Supernumerary anterior cerebral artery arising from RIGHT A1-2 junction. No large vessel occlusion, high-grade stenosis, abnormal luminal irregularity, aneurysm. Posterior circulation: Codominant vertebral arteries. Basilar artery is patent, with normal flow related enhancement of the main branch vessels. Normal flow related enhancement of the posterior  cerebral arteries. No large vessel occlusion, high-grade stenosis, abnormal luminal irregularity, aneurysm. IMPRESSION: MRI HEAD: Patchy areas of acute ischemia within LEFT MCA territory, no hemorrhagic conversion. Mild chronic small vessel ischemic disease. Old small LEFT cerebellar infarct. MRA HEAD: Negative, widely patent LEFT MCA. Electronically Signed   By: Elon Alas M.D.   On: 02/17/2015 05:47   Ct Cerebral Perfusion W/cm  02/16/2015  CLINICAL DATA:  Left brain stroke clinically. EXAM: CT CEREBRAL PERFUSION  WITH CONTRAST TECHNIQUE: CT perfusion study was performed CONTRAST:  31mL OMNIPAQUE IOHEXOL 350 MG/ML SOLN COMPARISON:  Head CT earlier same day FINDINGS: There is embolic disease to the left middle cerebral artery. Core infarction is evident in the left temporal lobe, the insula, the lateral basal ganglia in the deep frontoparietal white matter. Sizable penumbra is present throughout the majority of the temporal lobe and along the margins of the deep brain and frontoparietal infarctions. This examination suggests that there could be a significant volume of salvageable brain. However, core infarction measures up to 8 cm in diameter. IMPRESSION: Core infarction measuring up to 8 cm in diameter in the left middle cerebral artery territory affecting the left temporal lobe, insula, basal ganglia and frontoparietal deep white matter. Fairly sizable penumbra around the margins of the core infarction. Electronically Signed   By: Nelson Chimes M.D.   On: 02/16/2015 10:28   Dg Chest Port 1 View  02/16/2015  CLINICAL DATA:  Check endotracheal tube placement EXAM: PORTABLE CHEST - 1 VIEW COMPARISON:  None. FINDINGS: Cardiac shadow is within normal limits. The endotracheal tube is noted 4.2 cm above the carina. A nasogastric catheter is seen with the tip in the mid esophagus at the level of the aortic arch. This could be advanced further into the stomach. The lungs are well aerated. Some increased  density is noted in the medial aspect of the right lung base likely related to right lower lobe atelectasis/infiltrate. No bony abnormality is noted. IMPRESSION: Endotracheal tube in satisfactory position. Nasogastric catheter within the mid esophagus and can be advanced into the stomach. Increased density in the medial right lung base projecting in the right lower lobe. Electronically Signed   By: Inez Catalina M.D.   On: 02/16/2015 19:43   Dg Abd Portable 1v  02/16/2015  CLINICAL DATA:  OG tube placement EXAM: PORTABLE ABDOMEN - 1 VIEW COMPARISON:  Chest radiograph-earlier same day FINDINGS: An enteric tube is not identified. Paucity of bowel gas without evidence of obstruction. No supine evidence of pneumoperitoneum. There is mild elevation of the right hemidiaphragm with associated right medial basilar heterogeneous opacities. No definitive abnormal intra-abdominal calcifications. The right femoral approach vascular catheter overlies the right SI joint. No acute osseus abnormalities. IMPRESSION: 1. Nonvisualization of an enteric tube (The enteric tube tip overlies expected location of the superior aspect of the esophagus on the corresponding chest radiograph). 2. Paucity of bowel gas without evidence of obstruction. Electronically Signed   By: Sandi Mariscal M.D.   On: 02/16/2015 19:45   Mr Jodene Nam Head/brain Wo Cm  02/17/2015  CLINICAL DATA:  Follow-up LEFT MCA infarct, status post intervention. EXAM: MRI HEAD WITHOUT CONTRAST MRA HEAD WITHOUT CONTRAST TECHNIQUE: Multiplanar, multiecho pulse sequences of the brain and surrounding structures were obtained without intravenous contrast. Angiographic images of the head were obtained using MRA technique without contrast. COMPARISON:  CT head February 16, 2015 FINDINGS: MRI HEAD FINDINGS Reduced diffusion within LEFT basal ganglia, faint reduced diffusion LEFT frontal cortex and insula, punctate foci of reduced diffusion LEFT mesial temporal lobe. Areas demonstrate  low ADC values. No susceptibility artifact to suggest hemorrhage. No midline shift, mass effect or mass lesions. Ventricles and sulci are normal for patient's age. Scattered supratentorial and pontine white matter subcentimeter T2 hyperintensities compatible with mild chronic small vessel ischemic disease, less than expected for age. Old small LEFT cerebellar infarct. No abnormal extra-axial fluid collections. Status post LEFT ocular lens implant. Moderate to severe paranasal sinusitis with life-support lines in  place. Mastoid air cells are well aerated. No abnormal sellar expansion. No cerebellar tonsillar ectopia. No suspicious calvarial bone marrow signal. MRA HEAD FINDINGS Anterior circulation: Normal flow related enhancement of the included cervical, petrous, cavernous and supraclinoid internal carotid arteries. Patent anterior communicating artery. Normal flow related enhancement of the anterior and middle cerebral arteries, including distal segments. Supernumerary anterior cerebral artery arising from RIGHT A1-2 junction. No large vessel occlusion, high-grade stenosis, abnormal luminal irregularity, aneurysm. Posterior circulation: Codominant vertebral arteries. Basilar artery is patent, with normal flow related enhancement of the main branch vessels. Normal flow related enhancement of the posterior cerebral arteries. No large vessel occlusion, high-grade stenosis, abnormal luminal irregularity, aneurysm. IMPRESSION: MRI HEAD: Patchy areas of acute ischemia within LEFT MCA territory, no hemorrhagic conversion. Mild chronic small vessel ischemic disease. Old small LEFT cerebellar infarct. MRA HEAD: Negative, widely patent LEFT MCA. Electronically Signed   By: Elon Alas M.D.   On: 02/17/2015 05:47    Assessment/Plan: Diagnosis: embolic left MCA infarct  1. Does the need for close, 24 hr/day medical supervision in concert with the patient's rehab needs make it unreasonable for this patient to be  served in a less intensive setting? Potentially 2. Co-Morbidities requiring supervision/potential complications: htn, afib, recent vent 3. Due to bladder management, bowel management, safety, skin/wound care, disease management, medication administration, pain management and patient education, does the patient require 24 hr/day rehab nursing? Yes 4. Does the patient require coordinated care of a physician, rehab nurse, PT (1-2 hrs/day, 5 days/week), OT (1-2 hrs/day, 5 days/week) and SLP (1-2 hrs/day, 5 days/week) to address physical and functional deficits in the context of the above medical diagnosis(es)? Potentially Addressing deficits in the following areas: balance, endurance, locomotion, strength, transferring, bowel/bladder control, bathing, dressing, feeding, grooming, toileting, cognition, language and psychosocial support 5. Can the patient actively participate in an intensive therapy program of at least 3 hrs of therapy per day at least 5 days per week? Potentially 6. The potential for patient to make measurable gains while on inpatient rehab is TBD 7. Anticipated functional outcomes upon discharge from inpatient rehab are modified independent  with PT, modified independent and supervision with OT, supervision and min assist with SLP. 8. Estimated rehab length of stay to reach the above functional goals is: potentially up to 7 days 9. Does the patient have adequate social supports and living environment to accommodate these discharge functional goals? Yes 10. Anticipated D/C setting: Home 11. Anticipated post D/C treatments: HH therapy and Outpatient therapy 12. Overall Rehab/Functional Prognosis: excellent  RECOMMENDATIONS: This patient's condition is appropriate for continued rehabilitative care in the following setting: To be determined Patient has agreed to participate in recommended program. Potentially Note that insurance prior authorization may be required for reimbursement for  recommended care.  Comment: Pt hasn't been up with therapy yet. Appears to be making nice neurological progress. Has persistent language issues/apraxia which may impact mobility and self-care tasks as well. Will follow along for medical and functional progress.  Meredith Staggers, MD, [facility] 02/18/2015     02/18/2015

## 2015-02-18 NOTE — Progress Notes (Signed)
Pt referral to check on Eliquis coverage.  Pt will have $60 copay, and drug does not require prior authorization.  Will provide 30 day free card for Eliquis, and copay card which should bring copay down to $10/month.    Reinaldo Raddle, RN, BSN  Trauma/Neuro ICU Case Manager [phone]

## 2015-02-18 NOTE — Evaluation (Signed)
Speech Language Pathology Evaluation Patient Details Name: Kendra [name] MRN: [id number] DOB: [birth year] Today's Date: 02/18/2015 Time: [id number] SLP Time Calculation (min) (ACUTE ONLY): 55 min  Problem List:  Patient Active Problem List   Diagnosis Date Noted  . CVA (cerebral infarction) 02/16/2015  . Acute respiratory failure with hypoxia ([place]) 02/16/2015  . Essential hypertension 02/16/2015  . Acute encephalopathy 02/16/2015  . 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 Insomnia 01/26/2011  . GERD 02/16/2009  . TRANSIENT ISCHEMIC ATTACKS, HX OF 02/16/2009  . CHICKENPOX, HX OF 02/16/2009  . ESSENTIAL HYPERTENSION, BENIGN 12/04/2008  . ATRIAL FIBRILLATION 11/18/2008  . ATRIAL FLUTTER 11/18/2008  . PURE HYPERCHOLESTEROLEMIA 07/20/2008  . 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 Atrial flutter ([place])   . Atrial fibrillation (H[facility])     chronic anticoag  . Arthus phenomenon   . Hypercholesterolemia     Pure  . GERD (gastroesophageal reflux disease)   . Kidney infarction [facility])   . TIA (transient ischemic attack)     as per 07/30/00 note from [place]   . Hx of blood clots   . Hemorrhoids    Past Surgical History:  Past Surgical History  Procedure Laterality Date  . Breast biopsy Right   . Appendectomy    . Fibroid tumor removal  1996  . Cardiac electrophysiology study and ablation    . Radiology with anesthesia N/A 02/16/2015    Procedure: RADIOLOGY WITH ANESTHESIA;  Surgeon: Medication Radiologist, MD;  Location: [place] [facility];  Service: Radiology;  Laterality: N/A;   HPI:  69 year old female admitted with right sided weakness. 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 Therapeutic intervention provided included providing max phonemic cues which was [id number] successful at eliciting correct verbal responses. Additonally, use of writing will be a good therapeutic technique to facilitate improved expressive language. Extensive family education provided regarding patient's diagnosis, performance, prognosis, and therapeutic techniques which family can begin to utilize at this time. Recommend CIR for f/u therapy once medically stable.     SLP Assessment  Patient needs continued Speech Lanaguage Pathology Services    Follow Up Recommendations  Inpatient Rehab    Frequency and Duration min 3x week  2 weeks      SLP Evaluation Prior Functioning  Cognitive/Linguistic Baseline: Within functional limits Type of Home: House  Lives With: Spouse   Cognition  Overall Cognitive Status:  (to be determined in full as aphasia improves, appears [facility]) Orientation Level: Oriented X4    Comprehension  Auditory Comprehension Overall Auditory Comprehension: Impaired Yes/No Questions: Within Functional Limits Commands: Impaired (complex multimodal commands with 90% accuracy) Conversation: Complex Visual Recognition/Discrimination Discrimination: Within Function Limits Reading Comprehension Reading Status: Within funtional limits    Expression Expression Primary Mode of Expression: Verbal Verbal Expression Overall Verbal Expression: Impaired Initiation: No impairment Automatic Speech: Name;Social Response Level of Generative/Spontaneous Verbalization: Sentence Repetition: No impairment Naming: Impairment Responsive: 26-50% accurate Confrontation: Impaired Convergent: 25-49% accurate Divergent: 25-49% accurate Effective Techniques: Phonemic cues;Written cues Non-Verbal Means of Communication: Gestures Written Expression Dominant Hand:  Left Written Expression: Exceptions to WFL (90% accuracy  at the word level)   Oral / Motor Oral Motor/Sensory Function Overall Oral Motor/Sensory Function: Within functional limits Motor Speech Overall Motor Speech: Appears within functional limits for tasks assessed   Gabriel Rainwater [place], CCC-SLP [phone]  Gabriel Rainwater [place] 02/18/2015, 10:23 AM

## 2015-02-18 NOTE — Evaluation (Signed)
Occupational Therapy Evaluation Patient Details Name: Kendra [name] MRN: [id number] DOB: 1945-08-13 Today's Date: 02/18/2015    History of Present Illness pt presents with L MCA Infarct now s/p Revascularization.  pt with hx of A-fib and TIA.     Clinical Impression   Pt appearing fatigued, but agreeable to participate in evaluation. Presents with generalized weakness and impaired balance.  Difficult to assess pt's cognition due to impaired communication, will continue to evaluate. Anticipate pt will progress well with rehab.    Follow Up Recommendations  CIR    Equipment Recommendations   (to be determined)    Recommendations for Other Services       Precautions / Restrictions Precautions Precautions: Fall Restrictions Weight Bearing Restrictions: No      Mobility Bed Mobility      General bed mobility comments: pt in chair  Transfers Overall transfer level: Needs assistance Equipment used: 1 person hand held assist Transfers: Sit to/from Stand Sit to Stand: Min assist         General transfer comment: steadying assist    Balance Overall balance assessment: Needs assistance Sitting-balance support: No upper extremity supported;Feet supported Sitting balance-Leahy Scale: Good Sitting balance - Comments: denies dizziness   Standing balance support: No upper extremity supported;During functional activity Standing balance-Leahy Scale: Fair                              ADL Overall ADL's : Needs assistance/impaired Eating/Feeding: Set up;Sitting   Grooming: Wash/dry hands;Wash/dry face;Set up;Sitting   Upper Body Bathing: Sitting;Minimal assitance   Lower Body Bathing: Minimal assistance;Sit to/from stand   Upper Body Dressing : Minimal assistance;Sitting   Lower Body Dressing: Minimal assistance;Sit to/from stand Lower Body Dressing Details (indicate cue type and reason): able to don and doff socks seated at edge of chair Toilet  Transfer: Minimal assistance   Toileting- Clothing Manipulation and Hygiene: Minimal assistance;Sit to/from stand       Functional mobility during ADLs: Minimal assistance       Vision     Perception     Praxis      Pertinent Vitals/Pain Pain Assessment: No/denies pain     Hand Dominance Left   Extremity/Trunk Assessment Upper Extremity Assessment Upper Extremity Assessment: Overall WFL for tasks assessed      Cervical / Trunk Assessment Cervical / Trunk Assessment: Normal   Communication Communication Communication: Expressive difficulties   Cognition Arousal/Alertness: Awake/alert Behavior During Therapy: WFL for tasks assessed/performed Overall Cognitive Status: Difficult to assess                     General Comments       Exercises       Shoulder Instructions      Home Living Family/patient expects to be discharged to:: Private residence Living Arrangements: Spouse/significant other Available Help at Discharge: Family;Available 24 hours/day Type of Home: House Home Access: Stairs to enter [employer] of Steps: 5 and a landing Entrance Stairs-Rails: Left Home Layout: Two level;Able to live on main level with bedroom/bathroom     Bathroom Shower/Tub: Walk-in shower;Door   [employer] Toilet: Programmer, systems: Yes   Home Equipment: None      Lives With: Spouse    Prior Functioning/Environment Level of Independence: Independent             OT Diagnosis: Generalized weakness   OT Problem List: Decreased strength;Decreased activity tolerance;Impaired balance (sitting and/or standing);Decreased  coordination;Decreased knowledge of use of DME or AE   OT Treatment/Interventions: Self-care/ADL training;DME and/or AE instruction;Therapeutic activities;Patient/family education;Balance training    OT Goals(Current goals can be found in the care plan section) Acute Rehab OT Goals Patient Stated Goal: Per family  to go to rehab then home.   OT Goal Formulation: With patient Time For Goal Achievement: 03/04/15 Potential to Achieve Goals: Good  OT Frequency: Min 3X/week   Barriers to D/C:            Co-evaluation              End of Session Equipment Utilized During Treatment: Gait belt  Activity Tolerance: Patient limited by fatigue Patient left: in chair;with call bell/phone within reach;with family/visitor present;with nursing/sitter in room   Time: 1157-1221 OT Time Calculation (min): 24 min Charges:  OT General Charges $OT Visit: 1 Procedure OT Evaluation $Initial OT Evaluation Tier I: 1 Procedure OT Treatments $Self Care/Home Management : 8-22 mins G-Codes:    Kendra [name] 02/18/2015, 12:50 PM [phone]

## 2015-02-18 NOTE — Progress Notes (Signed)
STROKE TEAM PROGRESS NOTE   SUBJECTIVE (INTERVAL HISTORY) Her family is at the bedside. They are asking about rehab qualifications.  She has remained stable overnight without any neurological changes. Blood pressure has been adequately controlled. She passed a swallow eval and was started on eliquis.  OBJECTIVE Temp:  [97.1 F (36.2 C)-98.8 F (37.1 C)] 97.1 F (36.2 C) (12/22 0800) Pulse Rate:  [77-94] 80 (12/22 0800) Cardiac Rhythm:  [-] Normal sinus rhythm (12/22 0400) Resp:  [13-23] 19 (12/22 0800) BP: (127-157)/(53-65) 137/63 mmHg (12/22 0800) SpO2:  [97 %-100 %] 97 % (12/22 0800) Arterial Line BP: (132-173)/(63-110) 132/110 mmHg (12/21 1000) FiO2 (%):  [4 %-40 %] 4 % (12/21 1800)  CBC:   Recent Labs Lab 02/16/15 0830  02/17/15 0615 02/18/15 0227  WBC 8.0  --  6.8 8.1  NEUTROABS 5.5  --  5.0  --   HGB 14.1  < > 12.0 12.2  HCT 39.0  < > 32.9* 33.4*  MCV 86.7  --  87.5 87.0  PLT 194  --  178 186  < > = values in this interval not displayed.  Basic Metabolic Panel:   Recent Labs Lab 02/17/15 0615 02/18/15 0227  NA 139 132*  K 3.8 3.8  CL 109 104  CO2 20* 19*  GLUCOSE 102* 108*  BUN 8 6  CREATININE 0.72 0.62  CALCIUM 8.5* 7.9*  MG  --  1.6*  PHOS  --  2.9    Lipid Panel:     Component Value Date/Time   CHOL 121 02/17/2015 0615   TRIG 118 02/17/2015 0615   HDL 30* 02/17/2015 0615   CHOLHDL 4.0 02/17/2015 0615   VLDL 24 02/17/2015 0615   LDLCALC 67 02/17/2015 0615   HgbA1c:  Lab Results  Component Value Date   HGBA1C 5.2 02/17/2015   Urine Drug Screen:     Component Value Date/Time   LABOPIA NONE DETECTED 02/16/2015 1011   COCAINSCRNUR NONE DETECTED 02/16/2015 1011   LABBENZ NONE DETECTED 02/16/2015 1011   AMPHETMU NONE DETECTED 02/16/2015 1011   THCU NONE DETECTED 02/16/2015 1011   LABBARB NONE DETECTED 02/16/2015 1011      IMAGING  Ct Head Wo Contrast 02/16/2015   1. No acute intracranial hemorrhage. 2. Evolving acute left MCA  infarct.  02/16/2015  No acute intracranial abnormality. Sinusitis with air-fluid level.   Ct Cerebral Perfusion W/cm 02/16/2015   Core infarction measuring up to 8 cm in diameter in the left middle cerebral artery territory affecting the left temporal lobe, insula, basal ganglia and frontoparietal deep white matter. Fairly sizable penumbra around the margins of the core infarction.   Dg Chest Port 1 View 02/16/2015  Endotracheal tube in satisfactory position. Nasogastric catheter within the mid esophagus and can be advanced into the stomach. Increased density in the medial right lung base projecting in the right lower lobe.   MRI HEAD 02/17/2015  : Patchy areas of acute ischemia within LEFT MCA territory, no hemorrhagic conversion. Mild chronic small vessel ischemic disease. Old small LEFT cerebellar infarct.   MRA HEAD 02/17/2015  : Negative, widely patent LEFT MCA.   2D Echocardiogram  - Left ventricle: The cavity size was normal. Wall thickness wasnormal. Systolic function was normal. The estimated ejectionfraction was in the range of 55% to 60%. Wall motion was normal;there were no regional wall motion abnormalities. There was areduced contribution of atrial contraction to ventricularfilling, probably due to increased atrial contractile dysfunction(recent atrial fibrillation?). NSR during the study - Mitral  valve: Calcified annulus. - Left atrium: The atrium was moderately dilated. - Atrial septum: A patent foramen ovale cannot be excluded. - Pulmonary arteries: Systolic pressure was mildly increased. PA peak pressure: 37 mm Hg (S).   PHYSICAL EXAM Elderly Caucasian lady who is sedated, intubated, right groin arterial sheath. . Afebrile. Head is nontraumatic. Neck is supple without bruit.    Cardiac exam no murmur or gallop. Lungs are clear to auscultation. Distal pulses are well felt. Neurological Exam :  Awake alert interactive. Mild expressive aphasia with word finding  difficulties,Few paraphasic errors and difficulty with naming parts of objects. Good comprehension. Repetition slightly affected. Pupils equal reactive. Fundi were not visualized. Following simple midline commands and moving extremities purposefully against gravity to commands. No focal weakness diminished fine finger movements on the right. Orbits left over right upper extremity.. Deep tendon reflexes are symmetric. Plantars are downgoing. Withdraws to painful stimuli bilaterally.   ASSESSMENT/PLAN Kendra [name] is a 69 y.o. female with history of elevated factor 8  and atrial fibrillation who woke with aphasia and R hemiparesis. She did not receive IV t-PA due to unknown time last known well/wake up stroke. TICI 3 revascularization of occluded LT MCA using mechanical thrombectomy and IA integrelin.  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  Resultant  Expressive language difficulties  CT perfusion core infarct L temporal lobe, insula, basal ganglia and frontoparietal DWM, sizeable penumbra  MRI  patchy left MCA infarct   MRA Unremarkable, patent L MCA   2D Echo  No source of embolus   LE venous dopplers pending   LDL 67  HgbA1c 5.2  eliquis for VTE prophylaxis Diet regular Room service appropriate?: Yes; Fluid consistency:: Thin  Pradaxa (dabigatran) twice a day prior to admission, now on aspirin 300 mg suppository daily. pradaxa changed to eliquis last night after Dr. Leonie Man spoke with Dr. Lovena Le, will discontinue aspirin suppository 300 mg daily.  Ongoing aggressive stroke risk factor management  Therapy recommendations:  pending. Have requested CIR consult  Transfer to the floor   Disposition:  pending   Respiratory Failure  Intubated for neurointervention  extubated 12/21  Stable  Productive cough  CXR with medial R lung base  projecting into R lower lobe  ?  need for abx  Will ask CCM to followup  Atrial Fibrillation  Home anticoagulation:  pradaxa  Changed to eliquis   Other Stroke Risk Factors  Advanced age  Hx stroke/TIA - TIA 07/2000  Factor 8 deficiency  Known PFO  Hospital day # [place] for Pager information 02/18/2015 8:35 AM  I have personally examined this patient, reviewed notes, independently viewed imaging studies, participated in medical decision making and plan of care. I have made any additions or clarifications directly to the above note. Agree with note above. Plan to transfer her out of the ICU today. Mobilize out of bed. Physical occupational speech therapy consults. Anticipate discharge home or to rehabilitation over the next few days. Continue eliquis for secondary stroke prevention. I do long discussion the bedside with the patient, husband, son and daughter and answered questions. This patient is critically ill and at significant risk of neurological worsening, death and care requires constant monitoring of vital signs, hemodynamics,respiratory and cardiac monitoring, extensive review of multiple databases, frequent neurological assessment, discussion with family, other specialists and medical decision  making of high complexity.I have made any additions or clarifications directly to the above note.This critical care time does not reflect procedure time, or teaching time or supervisory time of PA/NP/Med Resident etc but could involve care discussion time.  I spent 30 minutes of neurocritical care time  in the care of  this patient.    Kendra Contras, MD Medical Director [facility] Pager: [phone] 02/18/2015 2:04 PM   To contact Stroke Continuity provider, please refer to [email or web address] After hours, contact General Neurology

## 2015-02-19 ENCOUNTER — Encounter (HOSPITAL_COMMUNITY): Payer: Self-pay | Admitting: Radiology

## 2015-02-19 DIAGNOSIS — Q211 Atrial septal defect: Secondary | ICD-10-CM

## 2015-02-19 DIAGNOSIS — Q2112 Patent foramen ovale: Secondary | ICD-10-CM

## 2015-02-19 LAB — GLUCOSE, CAPILLARY: GLUCOSE-CAPILLARY: 91 mg/dL (ref 65–99)

## 2015-02-19 LAB — TRIGLYCERIDES: TRIGLYCERIDES: 68 mg/dL (ref <150)

## 2015-02-19 MED ORDER — APIXABAN 5 MG PO TABS: 5.0000 mg | ORAL_TABLET | Freq: Two times a day (BID) | ORAL | Status: DC

## 2015-02-19 MED ORDER — METOPROLOL SUCCINATE ER 100 MG PO TB24
100.0000 mg | ORAL_TABLET | Freq: Every day | ORAL | Status: DC
  Administered 2015-02-19: 100 mg via ORAL
  Filled 2015-02-19: qty 1

## 2015-02-19 NOTE — Progress Notes (Signed)
Patient being discharged home with home health discharge summary reviewed with family IV's removed medications explained will transport down with wheel chair, family will transport home and care for patient.

## 2015-02-19 NOTE — Progress Notes (Signed)
Patient's family wants to talk to SLP about the Aphasia the mother is suffering with, SLP indicated they would try to send someone by to talk to them.

## 2015-02-19 NOTE — Discharge Summary (Signed)
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  Past Medical History  Diagnosis Date  . Factor VIII elevation(HCC)   . Atrial flutter ([place])   . Atrial fibrillation (HCC)     chronic anticoag  . Arthus phenomenon   . Hypercholesterolemia     Pure  . GERD (gastroesophageal reflux disease)   . 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 TIA (transient ischemic attack)     as per 07/30/00 note from [facility]   . Hx of blood clots   . Hemorrhoids    Past Surgical History  Procedure Laterality Date  . Ablasion    . Cesarean section    . Breast biopsy Right   . Appendectomy    . Fibroid tumor removal  1996  . Cardiac electrophysiology study and ablation    . Radiology with anesthesia N/A 02/16/2015    Procedure: RADIOLOGY WITH ANESTHESIA;  Surgeon: Medication Radiologist, MD;  Location: [facility];  Service: Radiology;  Laterality: N/A;      Medication List    STOP taking these medications        dabigatran 150 MG Caps capsule  Commonly known as:  PRADAXA     PRADAXA PO      TAKE these medications        apixaban 5 MG Tabs tablet  Commonly known as:  ELIQUIS  Take 1 tablet (5 mg total) by mouth 2 (two) times daily.     FISH OIL + D3 PO  Take 1 capsule by mouth daily.     Fish Oil 1000 MG Caps  Take 1 capsule by mouth daily.     metoprolol succinate 100 MG 24 hr tablet  Commonly known as:  TOPROL-XL  Take 100 mg by mouth daily. Take with or immediately following a meal.     multivitamin tablet  Take 1 tablet by mouth daily.        LABORATORY STUDIES CBC    Component Value Date/Time   WBC 8.1 02/18/2015 0227   RBC 3.84* 02/18/2015 0227   HGB 12.2 02/18/2015 0227   HCT 33.4* 02/18/2015 0227   PLT 186 02/18/2015 0227   MCV 87.0 02/18/2015 0227   MCH 31.8 02/18/2015 0227   MCHC 36.5* 02/18/2015 0227   RDW 11.8 02/18/2015 0227   LYMPHSABS 1.0 02/17/2015 0615   MONOABS 0.7 02/17/2015 0615   EOSABS 0.1 02/17/2015 0615   BASOSABS 0.0 02/17/2015 0615   CMP    Component Value Date/Time   NA 132* 02/18/2015 0227   K 3.8 02/18/2015 0227   CL 104 02/18/2015 0227  CO2 19* 02/18/2015 0227   GLUCOSE 108* 02/18/2015 0227   BUN 6 02/18/2015 0227   CREATININE 0.62 02/18/2015 0227   CALCIUM 7.9* 02/18/2015 0227   PROT 6.2* 02/16/2015 0830   ALBUMIN 3.2* 02/16/2015 0830   AST 21 02/16/2015 0830   ALT 17 02/16/2015 0830   ALKPHOS 83 02/16/2015 0830   BILITOT 0.9 02/16/2015 0830   GFRNONAA >60 02/18/2015 0227   GFRAA >60 02/18/2015 0227   COAGS Lab Results  Component Value Date   INR 1.37 02/16/2015   INR 1.3 01/28/2010   INR 2.6 12/22/2009   Lipid Panel    Component Value Date/Time   CHOL 121 02/17/2015 0615   TRIG 118 02/17/2015 0615   HDL 30* 02/17/2015 0615   CHOLHDL 4.0 02/17/2015 0615   VLDL 24 02/17/2015 0615   LDLCALC 67 02/17/2015 0615   HgbA1C  Lab Results  Component Value Date   HGBA1C 5.2 02/17/2015   Cardiac Panel (last 3 results) No results for input(s): CKTOTAL, CKMB, TROPONINI, RELINDX in the last 72 hours. Urinalysis    Component Value  Date/Time   COLORURINE YELLOW 02/16/2015 1011   APPEARANCEUR CLEAR 02/16/2015 1011   LABSPEC 1.013 02/16/2015 1011   PHURINE 7.0 02/16/2015 1011   GLUCOSEU NEGATIVE 02/16/2015 1011   GLUCOSEU NEGATIVE 04/30/2014 1120   HGBUR SMALL* 02/16/2015 1011   BILIRUBINUR NEGATIVE 02/16/2015 1011   KETONESUR NEGATIVE 02/16/2015 1011   PROTEINUR NEGATIVE 02/16/2015 1011   UROBILINOGEN 0.2 04/30/2014 1120   NITRITE NEGATIVE 02/16/2015 1011   LEUKOCYTESUR NEGATIVE 02/16/2015 1011   Urine Drug Screen     Component Value Date/Time   LABOPIA NONE DETECTED 02/16/2015 1011   COCAINSCRNUR NONE DETECTED 02/16/2015 1011   LABBENZ NONE DETECTED 02/16/2015 1011   AMPHETMU NONE DETECTED 02/16/2015 1011   THCU NONE DETECTED 02/16/2015 1011   LABBARB NONE DETECTED 02/16/2015 1011    Alcohol Level    Component Value Date/Time   ETH <5 02/16/2015 0830     SIGNIFICANT DIAGNOSTIC STUDIES Ct Head Wo Contrast 02/16/2015 1. No acute intracranial hemorrhage. 2. Evolving acute left MCA infarct.  02/16/2015 No acute intracranial abnormality. Sinusitis with air-fluid level.   Ct Cerebral Perfusion W/cm 02/16/2015 Core infarction measuring up to 8 cm in diameter in the left middle cerebral artery territory affecting the left temporal lobe, insula, basal ganglia and frontoparietal deep white matter. Fairly sizable penumbra around the margins of the core infarction.   Dg Chest Port 1 View 02/16/2015 Endotracheal tube in satisfactory position. Nasogastric catheter within the mid esophagus and can be advanced into the stomach. Increased density in the medial right lung base projecting in the right lower lobe.   MRI HEAD 02/17/2015 Patchy areas of acute ischemia within LEFT MCA territory, no hemorrhagic conversion. Mild chronic small vessel ischemic disease. Old small LEFT cerebellar infarct.   MRA HEAD 02/17/2015 Negative, widely patent LEFT MCA.   2D Echocardiogram  - Left ventricle: The cavity  size was normal. Wall thickness wasnormal. Systolic function was normal. The estimated ejectionfraction was in the range of 55% to 60%. Wall motion was normal;there were no regional wall motion abnormalities. There was areduced contribution of atrial contraction to ventricularfilling, probably due to increased atrial contractile dysfunction(recent atrial fibrillation?). NSR during the study - Mitral valve: Calcified annulus. - Left atrium: The atrium was moderately dilated. - Atrial septum: A patent foramen ovale cannot be excluded. - Pulmonary arteries: Systolic pressure was mildly increased. PA peak pressure: 37 mm Hg (S).  LE venous  Dopplers No evidence of DVT, superficial thrombosis, or Baker's Cyst.     HISTORY OF PRESENT ILLNESS Kendra [name] is an 69 y.o. female with a hx of factor 8 deficiency and A flutter on pradaxa who was last seen normal at 11pm last night 02/15/2015 and woke up this am 02/16/2015 at 8am not speaking and moving her R side too well. CTH shows dense L MCA sign and perfusion study shows mismatch. Patient was not administered TPA secondary to unknown time of onset. She was sent to IR under the wakeup stroke protocol where she received bilateral common carotid arteriograms followed by complete angiographicrevascularization of occluded LT MCA using x 1 pass with 14mm x 40 mm Solitaire retrieval device and approx 5 mg of superselective IA integrelin. TICI 3 revascularization She was admitted to the neuro ICU for further evaluation and treatment. Patient did well postprocedure and was extubated the next day. She was found to have mild expressive aphasia but no significant weakness. She was seen by physical occupational speech therapy and rehabilitation services and felt to possibly benefit from a short stay in inpatient rehabilitation but the patient was adamant and refused rehabilitation and wanted to go home and hen she was discharged from to the care of her family with  plans for outpatient speech therapy. Patient was switched from Pradaxa for long-term anticoagulation to eliquis 5 mg twice daily after discussion with her cardiologist Dr. Crissie Sickles and hematologist Dr. Lynett Fish COURSE Kendra [name] is a 69 y.o. female with history of elevated factor 8 and atrial fibrillation who woke with aphasia and R hemiparesis. She did not receive IV t-PA due to unknown time last known well/wake up stroke. TICI 3 revascularization of occluded LT MCA using mechanical thrombectomy and IA integrelin.  Stroke: Dominant patchy left MCA infarct embolic secondary to known atrial fibrillation vs Factor 8 deficiency, s/p TICI 3 revascularization with Solitaire and IA integrilin.  Resultant Expressive language difficulties  CT perfusion core infarct L temporal lobe, insula, basal ganglia and frontoparietal DWM, sizeable penumbra  MRI patchy left MCA infarct   MRA Unremarkable, patent L MCA   2D Echo No source of embolus   LE venous dopplers no embolus  LDL 67  HgbA1c 5.2  Pradaxa (dabigatran) twice a day prior to admission, received aspirin initially in the hospital. pradaxa changed to eliquis after Dr. Leonie Man spoke with Dr. Lovena Le, discontinued aspirin suppository 300 mg daily.  Ongoing aggressive stroke risk factor management  Therapy recommendations: CIR vs OP. Approved for IP rehab.  Disposition: home with HH therapies (PT, OT and ST)  Hypertension   Resumed home metoprolol  Respiratory Failure  Intubated for neurointervention  extubated 12/21  Stable  Productive cough  CXR with medial R lung base projecting into R lower lobe - CCM felt it was shadowing. No indication for treatment.   Atrial Fibrillation  Home anticoagulation: pradaxa  Changed to eliquis  Factor 8 deficiency  Often an acute phase reactant  If remains elevated (> 2x the standard deviation) per Dr. Beryle Beams would place on  anticoagulation. No one NOAC better than another  Level 272 in 2010, just over twice the norm  Recommend rechecking  Regardless, will need to be on anticoagulation due to atrial fibrillation long-term   Other Stroke Risk Factors  Advanced age  Hx stroke/TIA - TIA 07/2000  Known PFO   DISCHARGE EXAM Blood pressure 139/72, pulse 75, temperature 98 F (36.7 C), temperature source Oral, resp. rate  20, height 5\' 7"  (1.702 m), weight 66.8 kg (147 lb 4.3 oz), SpO2 98 %. Elderly Caucasian lady who is sedated, intubated, right groin arterial sheath. . Afebrile. Head is nontraumatic. Neck is supple without bruit. Cardiac exam no murmur or gallop. Lungs are clear to auscultation. Distal pulses are well felt. Neurological Exam :  Awake alert interactive. Mild expressive aphasia with word finding difficulties,Few paraphasic errors and difficulty with naming parts of objects. Good comprehension. Repetition slightly affected. Pupils equal reactive. Fundi were not visualized. Following simple midline commands and moving extremities purposefully against gravity to commands. No focal weakness diminished fine finger movements on the right. Orbits left over right upper extremity.. Deep tendon reflexes are symmetric. Plantars are downgoing. Withdraws to painful stimuli bilaterally.   Discharge Diet   Diet regular Room service appropriate?: Yes; Fluid consistency:: Thin liquids  DISCHARGE PLAN  Disposition:  Home with HH therapies (PT, OT, ST)   Eliquis (apixaban) daily for secondary stroke prevention.  Follow-up HELTON, MARGARET R, MD in 2 weeks.  Follow-up with Dr. Antony Contras, Stroke Clinic in 2 months.  35 minutes were spent preparing discharge.  [place] for Pager information 02/19/2015 2:26 PM   I have personally examined this patient, reviewed notes, independently viewed imaging studies, participated in medical decision making and plan of care. I  have made any additions or clarifications directly to the above note. Agree with note above.    Antony Contras, MD Medical Director [facility] Pager: [phone] 02/19/2015 2:54 PM

## 2015-02-19 NOTE — Progress Notes (Signed)
Speech Language Pathology Treatment: Cognitive-Linquistic  Patient Details Name: Kendra [name] MRN: [id number] DOB: Jun 02, 1945 Today's Date: 02/19/2015 Time: 1250-1350 SLP Time Calculation (min) (ACUTE ONLY): 60 min  Assessment / Plan / Recommendation Clinical Impression  Pt seen for aphasia treatment.  Demonstrates improved fluency today.  Persisting word-retrieval deficits characterized by semantic and phonemic paraphasias, as well as perseverations.  Pt demonstrates good recognition of errored responses; she has preserved writing ability so that words not retrieved verbally can often be written for self-cueing.  Pt requiring overall mod verbal assist with modeling provided, faded until pt can produce word spontaneously and repeat multiple times.  Family with many questions - discussed nature of aphasia and Kendra [name]' prognosis; methods for facilitating communication; the need to minimize distractions; reduction of communication demands; the benefit of errorless learning.  Family very involved in care and would like to take pt home with [facility] therapies.     HPI HPI: 69 year old female admitted with right sided weakness. MRI with Patchy areas of acute ischemia within LEFT MCA territory,      SLP Plan  Continue with current plan of care     Recommendations   intensive SLP services              Follow up Recommendations:  (family prefers to take pt home) Plan: Continue with current plan of care   Juan Quam Laurice 02/19/2015, 2:22 PM

## 2015-02-19 NOTE — Care Management Note (Signed)
Case Management Note  Patient Details  Name: Kendra [name] MRN: [id number] Date of Birth: [birth year]  Subjective/Objective:                    Action/Plan: Plan is to discharge patient home with home health services per pt and family. CM spoke with Burnetta Sabin, NP and they are in agreement that patient can discharge home. CM met with the patient and her family and provided them a list of home health agencies in the [facility] area. They selected [facility]. Tiffany with [facility] notified and accepted the referral. Patients family also provided the family with a list of private duty sitters per request. Bedside RN updated.   Expected Discharge Date:                  Expected Discharge Plan:  [place]  In-House Referral:  Clinical Social Work  Discharge planning Services  CM Consult  Post Acute Care Choice:  Home Health Choice offered to:  Adult Children  DME Arranged:    DME Agency:     HH Arranged:  PT, OT, Speech Therapy HH Agency:  [facility]  Status of Service:  Completed, signed off  Medicare Important Message Given:    Date Medicare IM Given:    Medicare IM give by:    Date Additional Medicare IM Given:    Additional Medicare Important Message give by:     If discussed at [place] of Stay Meetings, dates discussed:    Additional Comments:  Pollie Friar, RN 02/19/2015, 1:34 PM

## 2015-02-19 NOTE — Progress Notes (Signed)
Occupational Therapy Treatment Patient Details Name: Kendra [name] MRN: [id number] DOB: 1945/09/21 Today's Date: 02/19/2015    History of present illness pt presents with L MCA Infarct now s/p Revascularization.  pt with hx of A-fib and TIA.     OT comments  Pt with performing toileting and standing grooming with min guard assist this visit. Continues to report feeling fatigued. Family would like pt to be able to shower, RN notified.  Follow Up Recommendations  CIR    Equipment Recommendations    3 in 1 vs tub seat for home   Recommendations for Other Services      Precautions / Restrictions Precautions Precautions: Fall       Mobility Bed Mobility Overal bed mobility: Modified Independent                Transfers Overall transfer level: Needs assistance   Transfers: Sit to/from Stand Sit to Stand: Min guard         General transfer comment: steadying assist    Balance                                   ADL Overall ADL's : Needs assistance/impaired     Grooming: Wash/dry hands;Oral care;Brushing hair;Min guard;Standing Grooming Details (indicate cue type and reason): located items needed for grooming from her container beside the sink             Lower Body Dressing: Min guard;Sit to/from stand Lower Body Dressing Details (indicate cue type and reason): assist needed for stability in standing Toilet Transfer: Min guard;Ambulation;Regular Museum/gallery exhibitions officer and Hygiene: Min guard;Sit to/from Nurse, children's Details (indicate cue type and reason): recommend pt sit to shower initially due to instability and balance deficits Functional mobility during ADLs: Min guard (within room)        Vision                     Perception     Praxis      Cognition   Behavior During Therapy: [facility] for tasks assessed/performed Overall Cognitive Status: Difficult to assess                       Extremity/Trunk Assessment               Exercises     Shoulder Instructions       General Comments      Pertinent Vitals/ Pain       Pain Assessment: No/denies pain  Home Living                                          Prior Functioning/Environment              Frequency Min 3X/week     Progress Toward Goals  OT Goals(current goals can now be found in the care plan section)  Progress towards OT goals: Progressing toward goals  Acute Rehab OT Goals Patient Stated Goal: Per family to go to rehab then home.    Plan Discharge plan remains appropriate    Co-evaluation                 End of Session Equipment Utilized During Treatment: Gait belt   Activity  Tolerance Patient tolerated treatment well   Patient Left in chair;with call bell/phone within reach;with family/visitor present   Nurse Communication          Time: [id number] OT Time Calculation (min): 15 min  Charges: OT General Charges $OT Visit: 1 Procedure OT Treatments $Self Care/Home Management : 8-22 mins  [name] 02/19/2015, 9:34 AM  [phone]

## 2015-02-19 NOTE — Progress Notes (Signed)
Rehab admissions - I met with patient and family this am.  I do have authorization for acute inpatient rehab admission.  Patient is doing well with therapies and would like to go home.  Family considering options of HH vs CIR vs outpatient therapy.  Family will let me know in the next couple hours of their decision.  Call me for questions.  #[phone]

## 2015-02-19 NOTE — Discharge Instructions (Signed)

## 2015-02-19 NOTE — Progress Notes (Signed)
Patient provided the 30 day free card and the $10 co pay card for Eliquis. CM went over the cards with the family and they expressed understanding.

## 2015-02-25 ENCOUNTER — Telehealth: Payer: Self-pay | Admitting: Internal Medicine

## 2015-02-25 ENCOUNTER — Other Ambulatory Visit: Payer: Self-pay

## 2015-02-25 NOTE — Telephone Encounter (Signed)
New Message  Pt daughter called request a call back to have  the pt referred to a cardiologist that specifies in closing PFO's

## 2015-02-25 NOTE — Patient Outreach (Signed)
Phone outreach made to confirm patient agreement to [facility] Transition series.  Patient's husband answered and requested a phone call from our nurse tomorrow after 12n, as the household has questions.  [facility] calls will begin Sat 02/27/15.  Message given to our nurse to follow-up with patient's husband, Clair Gulling.

## 2015-02-25 NOTE — Telephone Encounter (Signed)
Returned call to patient's daughter.  She says Dr Lovena Le came by for a "courtesy" visit while she was in the hospital and told the family to call our office and make an appointment with Dr Burt Knack.  I let her know that I did not see anything in the notes but would have Dr. Antionette Char nurse look at this with him to see if she is a candidate for closure and call her back.  She was agreeable with plan

## 2015-02-26 ENCOUNTER — Other Ambulatory Visit (HOSPITAL_COMMUNITY): Payer: Self-pay | Admitting: Interventional Radiology

## 2015-02-26 ENCOUNTER — Other Ambulatory Visit: Payer: Self-pay

## 2015-02-26 DIAGNOSIS — I6350 Cerebral infarction due to unspecified occlusion or stenosis of unspecified cerebral artery: Secondary | ICD-10-CM

## 2015-02-26 NOTE — Patient Outreach (Signed)
Kendra [name]) Care Management  02/26/2015  Kendra [name] November 05, 1945 [id number]  [facility] Stroke Program Issue:  Patient's husband, [name] requested [facility] nurse call after 12 noon on 02/26/2015 to discuss patients condition and address questions.  Outreach call #1 to patient.  Husband reached and follow-up / Screening completed.   Providers: Primary MD:  Dr. Cecille Amsterdam. [facility], [address] [place], [place] [address]  - [phone] No pending appt at this time.  [name] to call and schedule appt.  Neurologist:  Dr. Leonie Man - next appt: 05/12/14  Radiologist:  Dr. D (?) Next appt  03/19/14  Cardiologist:  Dr. Burt Knack covering for Dr Crissie Sickles - next appt 03/03/14 HH: [facility] (PT, OT, ST) Insurance: [employer] (private insurance/employer -patient's husband still working)  Social: Married and lives in her home with husband.  Mobility: Ambulating with no assistive devices post Stroke. Falls: None  Pain: None  Transportation:  Husband Caregiver: Husband Advanced Directives:  No  DME:  Home BP cuff  THN conditions:   Admissions: 1  02/16/2015 - 02/19/2015 ([facility]) ER visits: 1 Husband states patient is very fatigued.  States h/o patient on ventilator for 2 days prior to discharge.  States sleeping a lot. States patient has good mobility and fine motor ability but some aphasia.   Monitoring home BP with no issues.  Denies balance changes, headaches, worsening of speech, worsening weakness.  Appropriate appetite and has improved since home.  Voiding and taking fluids well.  Sleeping well at night.  Denies fever or other symptoms of illness.    Medications:  Taking less than 10 medications  Co-pay cost issues: none   Consent: Patient agreed to [facility] Stroke [employer].   Plan Referral Date:  02/25/2015 [facility] Stroke Program services 02/16/2015 H/o patient is not eligible for [facility] services for ACO services and RN CM  will continue to follow for [facility] Stroke Program services.    RN CM advised that symptoms of fatigue not uncommon and that decreased mobility post stroke may also exhibit appearance of fatigue.  Encouraged that patient may appear to have fatigue during self rehab period.  Advised in signs and symptoms of depression and if additional symptoms of decreased appetite, inability to sleep at night or sleeping during the day, loss of interest in activities; that this should be reported to MD as would not be unusual for patient to have some mild depression post stroke also exhibiting symptoms of fatigue.  Encouraged to seek Primary MD follow-up to assess labs and or any other possible causes of fatigue outside of stroke.   States Primary MD located in [place] and considering getting a more local physician for primary care.  States daughter is also a physician and is planning to schedule appt to see Primary MD.  RN CM offered to place conference call to schedule post hospital appt but husband states ok to allow daughter to schedule as she used to work with this MD.   RN CM notified [place] Management Assistant: agreed to services/case opened [place] Stroke. RN CM will schedule for next contact call within one week.   RN CM advised to please notify MD of any changes in condition prior to scheduled appt's.   RN CM provided contact name and # [phone] or main office # [phone] and 24-hour nurse line # [phone].  RN CM confirmed patient is aware of 911 services for urgent emergency needs.  [name], RN,  BSN, [facility], [place] Management Care Management Coordinator [phone] Direct [phone] Cell [phone] Office [phone] Fax

## 2015-02-26 NOTE — Telephone Encounter (Signed)
F/u  Pt dtr returning RN phone call. Please call back and discuss.   

## 2015-02-26 NOTE — Telephone Encounter (Signed)
I spoke with the pt's daughter (Dr Lenox Ponds Davis--Pulmonologist in [place]) and scheduled the pt to see Dr Burt Knack on 03/04/15.  Dr Rosana Hoes would like to be involved in the pt's appointment and if possible would like to be on speaker phone.

## 2015-02-26 NOTE — Telephone Encounter (Signed)
I left Kendra [name] a message to contact our office in regards to arranging appointment for her mom to meet with Dr Burt Knack in regards to PFO.

## 2015-03-04 ENCOUNTER — Ambulatory Visit (INDEPENDENT_AMBULATORY_CARE_PROVIDER_SITE_OTHER): Payer: 59 | Admitting: Cardiovascular Disease

## 2015-03-04 ENCOUNTER — Encounter: Payer: Self-pay | Admitting: Cardiovascular Disease

## 2015-03-04 ENCOUNTER — Ambulatory Visit: Payer: Self-pay

## 2015-03-04 VITALS — BP 122/68 | HR 72 | Ht 67.0 in | Wt 150.4 lb

## 2015-03-04 DIAGNOSIS — Q211 Atrial septal defect: Secondary | ICD-10-CM | POA: Diagnosis not present

## 2015-03-04 DIAGNOSIS — Q2112 Patent foramen ovale: Secondary | ICD-10-CM

## 2015-03-04 NOTE — Patient Instructions (Signed)
Medication Instructions:  Your physician recommends that you continue on your current medications as directed. Please refer to the Current Medication list given to you today.  Labwork: No new orders.   Testing/Procedures: Your physician has requested that you have a TEE. During a TEE, sound waves are used to create images of your heart. It provides your doctor with information about the size and shape of your heart and how well your heart's chambers and valves are working. In this test, a transducer is attached to the end of a flexible tube that's guided down your throat and into your esophagus (the tube leading from you mouth to your stomach) to get a more detailed image of your heart. You are not awake for the procedure. Please see the instruction sheet given to you today. For further information please visit [email or web address].  Follow-Up: We will arrange further follow-up after your TEE.   Any Other Special Instructions Will Be Listed Below (If Applicable).     If you need a refill on your cardiac medications before your next appointment, please call your pharmacy.

## 2015-03-04 NOTE — Progress Notes (Signed)
Cardiology Office Note Date:  03/06/2015   ID:  Kendra [name], DOB Jul 16, 1945, MRN [id number]  PCP:  Roselyn Meier, MD  Cardiologist:  Sherren Mocha, MD    Chief Complaint  Patient presents with  . PFO   History of Present Illness: Kendra [name] is a 70 y.o. female who presents for evaluation of PFO.   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 She also has had a history of atrial flutter and has undergone ablation.  She has had 2 different episodes of renal infarcts, first at age 40 and again approximately 7 years ago. She was diagnosed with atrial flutter in the 1990's. She ultimately underwent an ablation procedure in the late 1990's.  The patient has a history of paroxysmal atrial fibrillation, hypertension, and recurrent embolic phenomena. She was admitted February 19, 2015 with a stroke. 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 She was treated with mechanical thrombectomy and intra-arterial Integrilin in the neuroradiology suite. She's had a good recovery with the exception of mild expressive aphasia. Her anticoagulation was switched from Pradaxa to Eliquis. 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 She has been seen by hematology in the remote past.  The patient has a known PFO dating back many years. There are records from [facility] when she underwent a TEE in 2002 and this is reported as showing a small interatrial septal communication, likely a PFO. She was evaluated in 2010 by Dr. Aline Brochure for consideration of PFO closure. Because of her possible hematologic disorder,  ongoing medical management was recommended with anticoagulation rather than device closure.  On December 17th she had a mechanical fall with extensive bruising. She underwent several imaging studies and she  had no fractures or major internal injuries. However, 3 days later she presented with a large stroke with symptoms and course as outlined above. She also has a history of TIA in the past. She's had no cardiopulmonary symptoms. Today, she denies symptoms of palpitations, chest pain, shortness of breath, orthopnea, PND, lower extremity edema, dizziness, or syncope.   Past Medical History  Diagnosis Date  . 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 Atrial flutter ([place])   . Atrial fibrillation ([facility])     chronic anticoag  . Arthus phenomenon   . Hypercholesterolemia     Pure  . GERD (gastroesophageal reflux disease)   . Kidney infarction [facility])   . TIA (transient ischemic attack)     as per 07/30/00 note from [place]   . Hx of blood clots   . Hemorrhoids     Past Surgical History  Procedure Laterality Date  . Ablasion    . Cesarean section    . Breast biopsy Right   . Appendectomy    . Fibroid tumor removal  1996  . Cardiac electrophysiology study and ablation    . Radiology with anesthesia N/A 02/16/2015    Procedure: RADIOLOGY WITH ANESTHESIA;  Surgeon: Medication Radiologist, MD;  Location: [place] [facility];  Service: Radiology;  Laterality: N/A;    Current Outpatient Prescriptions  Medication Sig Dispense Refill  . apixaban (ELIQUIS) 5 MG TABS tablet Take 1 tablet (5 mg total) by mouth 2 (two) times daily. 60 tablet 2  . Fish Oil-Cholecalciferol (FISH OIL + D3 PO) Take 1 capsule by mouth daily.    . metoprolol succinate (  TOPROL-XL) 100 MG 24 hr tablet Take 100 mg by mouth daily. Take with or immediately following a meal.    . Multiple Vitamin (MULTIVITAMIN) tablet Take 1 tablet by mouth daily.      Marland Kitchen aspirin 81 MG tablet Take 81 mg by mouth daily.     No current facility-administered medications for this visit.    Allergies:   Amiodarone hcl   Social History:  The patient  reports that she has never smoked. She has never used smokeless tobacco. She reports that she does not drink alcohol or  use illicit drugs.   Family History:  [name]  family history includes Alcohol abuse in her father; Lung cancer in her father; Other in her brother. There is no history of Colon cancer.    ROS:  Please see the history of present illness.  Otherwise, review of systems is positive for speech difficulty.  All other systems are reviewed and negative.    PHYSICAL EXAM: VS:  BP 122/68 mmHg  Pulse 72  Ht 5\' 7"  (1.702 m)  Wt 150 lb 6.4 oz (68.221 kg)  BMI 23.55 kg/m2  SpO2 99% , BMI Body mass index is 23.55 kg/(m^2). GEN: Well nourished, well developed, in no acute distress HEENT: normal Neck: no JVD, no masses. No carotid bruits Cardiac: RRR without murmur or gallop                Respiratory:  clear to auscultation bilaterally, normal work of breathing GI: soft, nontender, nondistended, + BS MS: no deformity or atrophy Ext: no pretibial edema, pedal pulses 2+= bilaterally Skin: warm and dry, no rash Neuro:  Strength and sensation are intact Psych: euthymic mood, full affect  EKG:  EKG is not ordered today.  Recent Labs: 02/16/2015: ALT 17 02/18/2015: BUN 6; Creatinine, Ser 0.62; Hemoglobin 12.2; Magnesium 1.6*; Platelets 186; Potassium 3.8; Sodium 132*   Lipid Panel     Component Value Date/Time   CHOL 121 02/17/2015 0615   TRIG 68 02/19/2015 1450   HDL 30* 02/17/2015 0615   CHOLHDL 4.0 02/17/2015 0615   VLDL 24 02/17/2015 0615   LDLCALC 67 02/17/2015 0615      Wt Readings from Last 3 Encounters:  03/05/15 150 lb (68.04 kg)  03/04/15 150 lb 6.4 oz (68.221 kg)  02/26/15 147 lb (66.679 kg)     Cardiac Studies Reviewed: 2D Echo 02/17/2015: Study Conclusions  - Left ventricle: The cavity size was normal. Wall thickness was normal. Systolic function was normal. The estimated ejection fraction was in the range of 55% to 60%. Wall motion was normal; there were no regional wall motion abnormalities. There was a reduced contribution of atrial contraction to  ventricular filling, probably due to increased atrial contractile dysfunction (recent atrial fibrillation?). NSR during the study - Mitral valve: Calcified annulus. - Left atrium: The atrium was moderately dilated. - Atrial septum: A patent foramen ovale cannot be excluded. - Pulmonary arteries: Systolic pressure was mildly increased. PA peak pressure: 37 mm Hg (S).  ASSESSMENT AND PLAN: PFO/stroke/recurrent embolic events: complex situation in patient with hypercoagulable disorder and history of atrial fibrillation. I have reviewed extensive data, including notes from Dr Aline Brochure at [facility] who evaluated her for PFO closure in 2010, recent neurology notes from her hospitalization, echo images and reports, and have discussed her case with Dr Lovena Le. Pertinent details include:  TEE in 2002 noted PFO - this was performed at [facility] when she was undergoing atrial flutter ablation  Hypercoagulable disorder (elevated Factor  VIII level)  Multiple embolic phenomena with hx of TIA, renal infarct x 2, and recent stroke  Compliance with anticoagulation and events have occurred despite that  Mechanical fall 2 days prior to most recent event  No documentation of atrial fibrillation in > 10 years (previously symptomatic)  Considering data above, it seems reasonable to consider PFO closure. We discussed clinical trial data, pros and cons of device closure, potential risks including device thrombus, stoke, MI, tamponade, cardiac injury, arrhythmia, device embolization, and late erosion. She understands that patients with hypercoagulable disorders have never been studied in randomized PFO trials, but there are multiple case reports and retrospective data on these patients suggesting safety of the procedure. I am going to review her case with Dr Leonie Man who cared for her during her recent stroke as well. Will then bring her back for further discussion after the TEE.  The patient is seen today with her husband  and her daughter who is a pediatric pulmonologist participated in the evaluation via conference call.  Current medicines are reviewed with the patient today.  The patient does not have concerns regarding medicines.  Labs/ tests ordered today include:  No orders of the defined types were placed in this encounter.   Disposition:   FU pending TEE results  Time spent conducting this evaluation is greater than 60 minutes, over half of which is spent in direct discussion with the patient and her family as outlined above.   Deatra James, MD  03/06/2015 6:29 AM    [address], [place], [place]  [id number] Phone: [phone]; Fax: [phone]

## 2015-03-05 ENCOUNTER — Telehealth: Payer: Self-pay | Admitting: Cardiovascular Disease

## 2015-03-05 ENCOUNTER — Other Ambulatory Visit: Payer: Self-pay

## 2015-03-05 NOTE — Patient Outreach (Addendum)
[facility]) Care Management  03/05/2015  Kendra [name] 04/21/1945 [id number]   [place] [facility]roke Program 02/16/2015 - 02/19/2015 (CVA)   Outreach call to patient. Husband reached and Initial Assessment completed.   Providers: Primary MD: Dr. Cecille Amsterdam. [facility], [address] [place], [place] [address] [phone] [name] to call and schedule appt.  Neurologist: Dr. Leonie Man - next appt: 05/12/14  Radiologist: Dr. D (?) Next appt 03/19/14  Cardiologist: Dr. 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  Mobility: Ambulating with no assistive devices post Stroke. Falls: None  Pain: None  Transportation: Husband Caregiver: Husband Advanced Directives: No  DME: Home BP cuff  THN conditions:  Admissions: 1 02/16/2015 - 02/19/2015 (CVA) ER visits: 1 Husband states no new issues over the past week.  Patient is walking with PT and is coping better with her aphasia.  ST sessions scheduled for next week.  Completed OT services and nearing completion of PT services.  Sleeping well at night.  States patient has lost 4 lbs and has decreased appetite.  States progress is slow but supportive family.   Husband states patient is scheduled for a TRANSESOPHAGEAL ECHOCARDIOGRAM (TEE) 03/08/2015.  Medications:  Taking less than 10 medications  Co-pay cost issues: none  New Medication:  ASA 81mg  tablet everyday added to medication regime.  Patient started 1st dose today (03/05/2015).   Plan Referral Date: 02/25/2015 [facility] services 02/16/2015 H/o patient is not eligible for [facility] services for ACO services and RN CM will continue to follow for [facility] Stroke Program services.  Week 1 02/26/2015 Screening and [facility] Stroke Week 2 03/05/2015  Initial Assessment   [facility] Educational Materials mailed 03/05/2015 -Recovering After Stroke -What You Can Do To Prevent A Second Stroke RN CM advised in next contact call (Week 3) Telephone Assessment/[facility] Stroke within one week.  RN CM reviewed stroke recovery.  RN CM encouraged patient to journal to log progress.  RN CM discussed risk of falls and fall prevention. RN CM encouraged to take medications as ordered.    RN CM sent Dr. Daryl [place] letter and Initial Assessment.  RN CM advised to please notify MD of any changes in condition prior to scheduled appt's.  RN CM provided contact name and # [phone] or main office # [phone] and 24-hour nurse line # [phone].  RN CM confirmed patient is aware of 911 services for urgent emergency needs.  [name], RN, BSN, [facility], CCM  [employer] Management Coordinator [phone] Direct [phone] Cell [phone] Office [phone] Fax

## 2015-03-05 NOTE — Telephone Encounter (Signed)
Ebony Hail called the office and said that she had already contacted Neurology and they cannot write an order for the pt until she is seen in the clinic.  Even though the pt was followed by neurology as an inpatient she is not scheduled to see Dr Leonie Man until 05/12/15.  Ebony Hail is requesting that Dr Burt Knack write order as a last resort so that the pt can continue with speech therapy.  I will forward this information to Dr Burt Knack to review.

## 2015-03-05 NOTE — Telephone Encounter (Signed)
New message      Calling to see if we can set pt up for [facility]pt [facility].  Pt does not have a pcp but is scheduled to see one as a new pt soon.  [facility] because she is not longer home bound.  Please fax order to [phone].  Please let nurse know if we can do this

## 2015-03-05 NOTE — Telephone Encounter (Signed)
I left a voicemail for Ebony Hail that this type of order would need to come from the pt's Neurologist (Dr Leonie Man).

## 2015-03-05 NOTE — Telephone Encounter (Signed)
This is fine. Happy to order speech rehab for her. thx

## 2015-03-08 ENCOUNTER — Encounter (HOSPITAL_COMMUNITY)
Admission: RE | Disposition: A | Discharge status: Home / Self Care | Payer: Self-pay | Source: Ambulatory Visit | Attending: Cardiology

## 2015-03-08 ENCOUNTER — Ambulatory Visit (HOSPITAL_COMMUNITY)
Admission: RE | Admit: 2015-03-08 | Discharge: 2015-03-08 | Disposition: A | Discharge status: Home / Self Care | Payer: 59 | Source: Ambulatory Visit | Attending: Cardiology | Admitting: Cardiology

## 2015-03-08 ENCOUNTER — Encounter (HOSPITAL_COMMUNITY): Payer: Self-pay | Admitting: *Deleted

## 2015-03-08 ENCOUNTER — Encounter: Payer: Self-pay | Admitting: Internal Medicine

## 2015-03-08 ENCOUNTER — Telehealth: Payer: Self-pay | Admitting: Nurse Practitioner

## 2015-03-08 DIAGNOSIS — I4892 Unspecified atrial flutter: Secondary | ICD-10-CM | POA: Insufficient documentation

## 2015-03-08 DIAGNOSIS — I4891 Unspecified atrial fibrillation: Secondary | ICD-10-CM | POA: Diagnosis not present

## 2015-03-08 DIAGNOSIS — Z8673 Personal history of transient ischemic attack (TIA), and cerebral infarction without residual deficits: Secondary | ICD-10-CM | POA: Diagnosis not present

## 2015-03-08 DIAGNOSIS — Q211 Atrial septal defect: Secondary | ICD-10-CM | POA: Diagnosis not present

## 2015-03-08 DIAGNOSIS — I340 Nonrheumatic mitral (valve) insufficiency: Secondary | ICD-10-CM | POA: Diagnosis not present

## 2015-03-08 HISTORY — PX: TEE WITHOUT CARDIOVERSION: SHX5443

## 2015-03-08 SURGERY — ECHOCARDIOGRAM, TRANSESOPHAGEAL
Anesthesia: Moderate Sedation

## 2015-03-08 MED ORDER — HYDRALAZINE HCL 20 MG/ML IJ SOLN
INTRAMUSCULAR | Status: DC | PRN
  Administered 2015-03-08 (×2): 10 mg via INTRAVENOUS

## 2015-03-08 MED ORDER — FENTANYL CITRATE (PF) 100 MCG/2ML IJ SOLN
INTRAMUSCULAR | Status: DC | PRN
  Administered 2015-03-08 (×3): 25 ug via INTRAVENOUS

## 2015-03-08 MED ORDER — FENTANYL CITRATE (PF) 100 MCG/2ML IJ SOLN
INTRAMUSCULAR | Status: AC
  Filled 2015-03-08: qty 2

## 2015-03-08 MED ORDER — HYDRALAZINE HCL 20 MG/ML IJ SOLN
INTRAMUSCULAR | Status: AC
  Filled 2015-03-08: qty 1

## 2015-03-08 MED ORDER — SODIUM CHLORIDE 0.9 % IV SOLN
INTRAVENOUS | Status: DC
  Administered 2015-03-08: 13:00:00 via INTRAVENOUS

## 2015-03-08 MED ORDER — BUTAMBEN-TETRACAINE-BENZOCAINE 2-2-14 % EX AERO
INHALATION_SPRAY | CUTANEOUS | Status: DC | PRN
  Administered 2015-03-08: 2 via TOPICAL

## 2015-03-08 MED ORDER — DIPHENHYDRAMINE HCL 50 MG/ML IJ SOLN
INTRAMUSCULAR | Status: AC
  Filled 2015-03-08: qty 1

## 2015-03-08 MED ORDER — MIDAZOLAM HCL 5 MG/ML IJ SOLN
INTRAMUSCULAR | Status: AC
  Filled 2015-03-08: qty 2

## 2015-03-08 MED ORDER — MIDAZOLAM HCL 10 MG/2ML IJ SOLN
INTRAMUSCULAR | Status: DC | PRN
  Administered 2015-03-08: 1 mg via INTRAVENOUS
  Administered 2015-03-08: 2 mg via INTRAVENOUS
  Administered 2015-03-08: 1 mg via INTRAVENOUS
  Administered 2015-03-08: 2 mg via INTRAVENOUS

## 2015-03-08 NOTE — CV Procedure (Signed)
     Transesophageal Echocardiogram Note  Kendra [name] [id number] [birth year]  Procedure: Transesophageal Echocardiogram Indications: CVA, h/o a-flutter ablation, H/O PFO  Procedure Details Consent: Obtained Time Out: Verified patient identification, verified procedure, site/side was marked, verified correct patient position, special equipment/implants available, Radiology Safety Procedures followed,  medications/allergies/relevent history reviewed, required imaging and test results available.  Performed  Medications: Fentanyl: 75 mcg iv  Versed: 10 mg iv Hydralazine: 20 mg iv  Left Ventrical:  LVEF 55-60%  Mitral Valve: Mild MR  Aortic Valve: Normal  Tricuspid Valve: Mild TR.   Pulmonic Valve: Normal.  Left Atrium/ Left atrial appendage: Left atrial appendage is large with decreased filling and emptying velocities. There is severe smoke/sludge and a thrombus in the tip of the appendage measuring 21 x 12 mm.    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   Aorta: Mild plaque.   Complications: No apparent complications Patient did tolerate procedure well.  The results were discussed with Dr Burt Knack.   [name], MD, [facility] 03/08/2015, 1:58 PM

## 2015-03-08 NOTE — Interval H&P Note (Signed)
History and Physical Interval Note:  03/08/2015 11:46 AM  Kendra [name]  has presented today for surgery, with the diagnosis of PFO  The various methods of treatment have been discussed with the patient and family. After consideration of risks, benefits and other options for treatment, the patient has consented to  Procedure(s): TRANSESOPHAGEAL ECHOCARDIOGRAM (TEE) (N/A) as a surgical intervention .  [name] history has been reviewed, patient examined, no change in status, stable for surgery.  I have reviewed the patient's chart and labs.  Questions were answered to the patient's satisfaction.     [name]

## 2015-03-08 NOTE — Telephone Encounter (Signed)
Phone call from son this morning regarding medicines prior to TEE - planned for later today. She has taken her Eliquis this morning. Was unsure about what to do with her Toprol - usually takes with food - instructed it would be ok to take the Toprol with a sip of water and remain NPO for her procedure later today.   Burtis Junes, RN, [facility] [address] [place], [place]  [address] [phone]

## 2015-03-08 NOTE — Progress Notes (Signed)
  Echocardiogram Echocardiogram Transesophageal has been performed.  Kendra [name] 03/08/2015, 2:56 PM

## 2015-03-08 NOTE — Discharge Instructions (Signed)

## 2015-03-08 NOTE — H&P (View-Only) (Signed)
STROKE TEAM PROGRESS NOTE   SUBJECTIVE (INTERVAL HISTORY) Her family is at the bedside. They are asking about rehab qualifications.  She has remained stable overnight without any neurological changes. Blood pressure has been adequately controlled. She passed a swallow eval and was started on eliquis.  OBJECTIVE Temp:  [97.1 F (36.2 C)-98.8 F (37.1 C)] 97.1 F (36.2 C) (12/22 0800) Pulse Rate:  [77-94] 80 (12/22 0800) Cardiac Rhythm:  [-] Normal sinus rhythm (12/22 0400) Resp:  [13-23] 19 (12/22 0800) BP: (127-157)/(53-65) 137/63 mmHg (12/22 0800) SpO2:  [97 %-100 %] 97 % (12/22 0800) Arterial Line BP: (132-173)/(63-110) 132/110 mmHg (12/21 1000) FiO2 (%):  [4 %-40 %] 4 % (12/21 1800)  CBC:   Recent Labs Lab 02/16/15 0830  02/17/15 0615 02/18/15 0227  WBC 8.0  --  6.8 8.1  NEUTROABS 5.5  --  5.0  --   HGB 14.1  < > 12.0 12.2  HCT 39.0  < > 32.9* 33.4*  MCV 86.7  --  87.5 87.0  PLT 194  --  178 186  < > = values in this interval not displayed.  Basic Metabolic Panel:   Recent Labs Lab 02/17/15 0615 02/18/15 0227  NA 139 132*  K 3.8 3.8  CL 109 104  CO2 20* 19*  GLUCOSE 102* 108*  BUN 8 6  CREATININE 0.72 0.62  CALCIUM 8.5* 7.9*  MG  --  1.6*  PHOS  --  2.9    Lipid Panel:     Component Value Date/Time   CHOL 121 02/17/2015 0615   TRIG 118 02/17/2015 0615   HDL 30* 02/17/2015 0615   CHOLHDL 4.0 02/17/2015 0615   VLDL 24 02/17/2015 0615   LDLCALC 67 02/17/2015 0615   HgbA1c:  Lab Results  Component Value Date   HGBA1C 5.2 02/17/2015   Urine Drug Screen:     Component Value Date/Time   LABOPIA NONE DETECTED 02/16/2015 1011   COCAINSCRNUR NONE DETECTED 02/16/2015 1011   LABBENZ NONE DETECTED 02/16/2015 1011   AMPHETMU NONE DETECTED 02/16/2015 1011   THCU NONE DETECTED 02/16/2015 1011   LABBARB NONE DETECTED 02/16/2015 1011      IMAGING  Ct Head Wo Contrast 02/16/2015   1. No acute intracranial hemorrhage. 2. Evolving acute left MCA  infarct.  02/16/2015  No acute intracranial abnormality. Sinusitis with air-fluid level.   Ct Cerebral Perfusion W/cm 02/16/2015   Core infarction measuring up to 8 cm in diameter in the left middle cerebral artery territory affecting the left temporal lobe, insula, basal ganglia and frontoparietal deep white matter. Fairly sizable penumbra around the margins of the core infarction.   Dg Chest Port 1 View 02/16/2015  Endotracheal tube in satisfactory position. Nasogastric catheter within the mid esophagus and can be advanced into the stomach. Increased density in the medial right lung base projecting in the right lower lobe.   MRI HEAD 02/17/2015  : Patchy areas of acute ischemia within LEFT MCA territory, no hemorrhagic conversion. Mild chronic small vessel ischemic disease. Old small LEFT cerebellar infarct.   MRA HEAD 02/17/2015  : Negative, widely patent LEFT MCA.   2D Echocardiogram  - Left ventricle: The cavity size was normal. Wall thickness wasnormal. Systolic function was normal. The estimated ejectionfraction was in the range of 55% to 60%. Wall motion was normal;there were no regional wall motion abnormalities. There was areduced contribution of atrial contraction to ventricularfilling, probably due to increased atrial contractile dysfunction(recent atrial fibrillation?). NSR during the study - Mitral  valve: Calcified annulus. - Left atrium: The atrium was moderately dilated. - Atrial septum: A patent foramen ovale cannot be excluded. - Pulmonary arteries: Systolic pressure was mildly increased. PA peak pressure: 37 mm Hg (S).   PHYSICAL EXAM Elderly Caucasian lady who is sedated, intubated, right groin arterial sheath. . Afebrile. Head is nontraumatic. Neck is supple without bruit.    Cardiac exam no murmur or gallop. Lungs are clear to auscultation. Distal pulses are well felt. Neurological Exam :  Awake alert interactive. Mild expressive aphasia with word finding  difficulties,Few paraphasic errors and difficulty with naming parts of objects. Good comprehension. Repetition slightly affected. Pupils equal reactive. Fundi were not visualized. Following simple midline commands and moving extremities purposefully against gravity to commands. No focal weakness diminished fine finger movements on the right. Orbits left over right upper extremity.. Deep tendon reflexes are symmetric. Plantars are downgoing. Withdraws to painful stimuli bilaterally.   ASSESSMENT/PLAN Kendra [name] is a 70 y.o. female with history of elevated factor 8  and atrial fibrillation who woke with aphasia and R hemiparesis. She did not receive IV t-PA due to unknown time last known well/wake up stroke. TICI 3 revascularization of occluded LT MCA using mechanical thrombectomy and IA integrelin.  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  Resultant  Expressive language difficulties  CT perfusion core infarct L temporal lobe, insula, basal ganglia and frontoparietal DWM, sizeable penumbra  MRI  patchy left MCA infarct   MRA Unremarkable, patent L MCA   2D Echo  No source of embolus   LE venous dopplers pending   LDL 67  HgbA1c 5.2  eliquis for VTE prophylaxis Diet regular Room service appropriate?: Yes; Fluid consistency:: Thin  Pradaxa (dabigatran) twice a day prior to admission, now on aspirin 300 mg suppository daily. pradaxa changed to eliquis last night after Dr. Leonie Man spoke with Dr. Lovena Le, will discontinue aspirin suppository 300 mg daily.  Ongoing aggressive stroke risk factor management  Therapy recommendations:  pending. Have requested CIR consult  Transfer to the floor   Disposition:  pending   Respiratory Failure  Intubated for neurointervention  extubated 12/21  Stable  Productive cough  CXR with medial R lung base  projecting into R lower lobe  ?  need for abx  Will ask CCM to followup  Atrial Fibrillation  Home anticoagulation:  pradaxa  Changed to eliquis   Other Stroke Risk Factors  Advanced age  Hx stroke/TIA - TIA 07/2000  Factor 8 deficiency  Known PFO  Hospital day # [place] for Pager information 02/18/2015 8:35 AM  I have personally examined this patient, reviewed notes, independently viewed imaging studies, participated in medical decision making and plan of care. I have made any additions or clarifications directly to the above note. Agree with note above. Plan to transfer her out of the ICU today. Mobilize out of bed. Physical occupational speech therapy consults. Anticipate discharge home or to rehabilitation over the next few days. Continue eliquis for secondary stroke prevention. I do long discussion the bedside with the patient, husband, son and daughter and answered questions. This patient is critically ill and at significant risk of neurological worsening, death and care requires constant monitoring of vital signs, hemodynamics,respiratory and cardiac monitoring, extensive review of multiple databases, frequent neurological assessment, discussion with family, other specialists and medical decision  making of high complexity.I have made any additions or clarifications directly to the above note.This critical care time does not reflect procedure time, or teaching time or supervisory time of PA/NP/Med Resident etc but could involve care discussion time.  I spent 30 minutes of neurocritical care time  in the care of  this patient.    Antony Contras, MD Medical Director [facility] Pager: [phone] 02/18/2015 2:04 PM   To contact Stroke Continuity provider, please refer to [email or web address] After hours, contact General Neurology

## 2015-03-09 ENCOUNTER — Encounter (HOSPITAL_COMMUNITY): Payer: Self-pay | Admitting: Cardiology

## 2015-03-09 NOTE — Telephone Encounter (Signed)
Order faxed to [facility] and I made [facility] aware.

## 2015-03-10 ENCOUNTER — Telehealth: Payer: Self-pay | Admitting: Cardiovascular Disease

## 2015-03-10 ENCOUNTER — Ambulatory Visit (INDEPENDENT_AMBULATORY_CARE_PROVIDER_SITE_OTHER): Payer: 59 | Admitting: Cardiovascular Disease

## 2015-03-10 ENCOUNTER — Encounter: Payer: Self-pay | Admitting: Cardiovascular Disease

## 2015-03-10 VITALS — BP 110/62 | HR 70 | Ht 67.0 in | Wt 150.0 lb

## 2015-03-10 DIAGNOSIS — I513 Intracardiac thrombosis, not elsewhere classified: Secondary | ICD-10-CM

## 2015-03-10 DIAGNOSIS — I5189 Other ill-defined heart diseases: Secondary | ICD-10-CM | POA: Diagnosis not present

## 2015-03-10 MED ORDER — WARFARIN SODIUM 5 MG PO TABS: ORAL_TABLET | ORAL | Status: DC

## 2015-03-10 MED ORDER — ENOXAPARIN SODIUM 100 MG/ML ~~LOC~~ SOLN: 100.0000 mg | SUBCUTANEOUS | Status: DC

## 2015-03-10 NOTE — Telephone Encounter (Signed)
Diagnosis Stroke for Speech Therapy.  Fax sent to [facility] (2 Dr Burt Knack office visit notes and [facility] Discharge Summary).

## 2015-03-10 NOTE — Patient Instructions (Signed)
Medication Instructions:  Your physician has recommended you make the following change in your medication:  1. Please take your dosage of Eliquis tonight and tomorrow morning and then STOP 2. START Warfarin 5mg  on Thursday evening (take at the same time everyday) 3. START Lovenox 100mg  every evening  Labwork: You have been referred to the Anticoagulation Clinic for INR check on 03/15/2015  Testing/Procedures: No new orders.   Follow-Up: You will need weekly INR checks.  Once you have 6 therapeutic readings then we will do another TEE.   Any Other Special Instructions Will Be Listed Below (If Applicable).     If you need a refill on your cardiac medications before your next appointment, please call your pharmacy.

## 2015-03-10 NOTE — Progress Notes (Signed)
Cardiology Office Note Date:  03/10/2015   ID:  Kendra [name], DOB Dec 19, 1945, MRN [id number]  PCP:  Roselyn Meier, MD  Cardiologist:  Sherren Mocha, MD    Chief Complaint  Patient presents with  . New Evaluation    discuss TEE, pt has no complaints today     History of Present Illness: Kendra [name] is a 70 y.o. female who presents for follow-up evaluation. She has a complex history of hypercoagulable disorder (elevated factor VIII levels), paroxysmal atrial flutter, and multiple embolic events (renal infarcts x 2, TIA, and recent stroke). She was recently seen 03/04/2015 for consideration of treatment options related to PFO/ASD and recurrent embolism. A TEE was performed since I have seen her and she returns today with her husband for further discussion of findings.  No interval change in symptoms. She denies chest pain or dyspnea. Continues to participate in speech therapy.   Past Medical History  Diagnosis Date  . 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 Atrial flutter ([place])   . Atrial fibrillation ([facility])     chronic anticoag  . Arthus phenomenon   . Hypercholesterolemia     Pure  . GERD (gastroesophageal reflux disease)   . Kidney infarction [facility])   . TIA (transient ischemic attack)     as per 07/30/00 note from [facility]   . Hx of blood clots   . Hemorrhoids     Past Surgical History  Procedure Laterality Date  . Ablasion    . Cesarean section    . Breast biopsy Right   . Appendectomy    . Fibroid tumor removal  1996  . Cardiac electrophysiology study and ablation    . Radiology with anesthesia N/A 02/16/2015    Procedure: RADIOLOGY WITH ANESTHESIA;  Surgeon: Medication Radiologist, MD;  Location: [facility] NEURO ORS;  Service: Radiology;  Laterality: N/A;  . Tee without cardioversion N/A 03/08/2015    Procedure: TRANSESOPHAGEAL ECHOCARDIOGRAM (TEE);  Surgeon: [name], MD;  Location: [facility] ENDOSCOPY;  Service: Cardiovascular;  Laterality: N/A;    Current Outpatient  Prescriptions  Medication Sig Dispense Refill  . apixaban (ELIQUIS) 5 MG TABS tablet Take 1 tablet (5 mg total) by mouth 2 (two) times daily. 60 tablet 2  . aspirin 81 MG tablet Take 81 mg by mouth daily.    . Fish Oil-Cholecalciferol (FISH OIL + D3 PO) Take 1 capsule by mouth daily.    . metoprolol succinate (TOPROL-XL) 100 MG 24 hr tablet Take 100 mg by mouth daily. Take with or immediately following a meal.    . Multiple Vitamin (MULTIVITAMIN) tablet Take 1 tablet by mouth daily.      Marland Kitchen enoxaparin (LOVENOX) 100 MG/ML injection Inject 1 mL (100 mg total) into the skin daily. 10 Syringe 0  . warfarin (COUMADIN) 5 MG tablet Take as directed by Anticoagulation Clinic 30 tablet 2   No current facility-administered medications for this visit.    Allergies:   Amiodarone hcl   Social History:  The patient  reports that she has never smoked. She has never used smokeless tobacco. She reports that she does not drink alcohol or use illicit drugs.   Family History:  [name]  family history includes Alcohol abuse in her father; Lung cancer in her father; Other in her brother. There is no history of Colon cancer.    ROS:  Please see the history of present illness.  All other systems are reviewed and negative.    PHYSICAL EXAM:  VS:  BP 110/62 mmHg  Pulse 70  Ht 5\' 7"  (1.702 m)  Wt 150 lb (68.04 kg)  BMI 23.49 kg/m2  SpO2 98% , BMI Body mass index is 23.49 kg/(m^2). Exam not performed today as our time is spent in discussion.   EKG:  EKG is not ordered today.  Recent Labs: 02/16/2015: ALT 17 02/18/2015: BUN 6; Creatinine, Ser 0.62; Hemoglobin 12.2; Magnesium 1.6*; Platelets 186; Potassium 3.8; Sodium 132*   Lipid Panel     Component Value Date/Time   CHOL 121 02/17/2015 0615   TRIG 68 02/19/2015 1450   HDL 30* 02/17/2015 0615   CHOLHDL 4.0 02/17/2015 0615   VLDL 24 02/17/2015 0615   LDLCALC 67 02/17/2015 0615      Wt Readings from Last 3 Encounters:  03/10/15 150 lb (68.04  kg)  03/05/15 150 lb (68.04 kg)  03/04/15 150 lb 6.4 oz (68.221 kg)     Cardiac Studies Reviewed: TEE reviewed: there is a small secundum ASD with left-to-right flow. There is thrombus and spontaneous contrast in the left atrial appendage. LV function is normal. Formal report is currently pending.   ASSESSMENT AND PLAN: 1.  Stroke 2. Left atrial appendage thrombus 3. Paroxysmal atrial fibrillation, not documented in several years 4. ASD, small 5. 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 She's had a stroke despite therapeutic anticoagulation and essentially represents a treatment failure. It's possible that her small ASD is a residual defect after undergoing previous transseptal procedures. It does not have the appearance of a typical PFO as there is a small defect adjacent to the foramen with continuous left-to-right flow. The bigger issue at this point is the presence of left atrial appendage thrombus despite therapeutic anticoagulation. After review with colleagues and discussion of treatment options, it's probably best to transition her from Eliquis to warfarin. Recommend the following:  Stop Eliquis after tomorrow morning's dose  Start Lovenox tomorrow evening at a dose of 100 mg daily (1.5 mg/kg)  Start warfarin tomorrow evening  Follow-up anticoagulation clinic appt arranged for Monday 1/16  Continue ASA 81 mg daily  Repeat TEE after 6 weeks of therapeutic anticoagulation  Will see back in follow-up to schedule TEE in about 6 weeks  Current medicines are reviewed with the patient today.  The patient does not have concerns regarding medicines.  Labs/ tests ordered today include:  No orders of the defined types were placed in this encounter.    Disposition:   FU 6 weeks  Time spent in counseling and coordination of care today is 30  minutes, over half of which is spent in face-to-face discussion about TEE findings, treatment options, and further issues outlined above.   Deatra James, MD  03/10/2015 3:10 PM    [facility] [place], [place],   [address] Phone: [phone]; Fax: [phone]

## 2015-03-10 NOTE — Telephone Encounter (Signed)
Dr Valentino Saxon will see patient for 03-17-15 speech , they need patients diagnosis and ov notes faxed to [phone]

## 2015-03-11 ENCOUNTER — Other Ambulatory Visit: Payer: Self-pay

## 2015-03-11 ENCOUNTER — Telehealth: Payer: Self-pay | Admitting: Cardiovascular Disease

## 2015-03-11 DIAGNOSIS — I639 Cerebral infarction, unspecified: Secondary | ICD-10-CM

## 2015-03-11 NOTE — Telephone Encounter (Signed)
New Message   Pt husband is calling he needs clarification on medication that Dr.Cooper prescribed

## 2015-03-11 NOTE — Patient Outreach (Addendum)
[facility]) Care Management  03/11/2015  Kendra [name] 1945/07/09 [id number]  [facility] Stroke Program Week 3 Telephone Assessment  Admission:  02/16/2015 - 02/19/2015 (CVA)  Providers: Primary MD: Dr. Cecille Amsterdam. [facility], [address] [place], [place] [address] [phone] [name] to call and schedule appt.   Husband states daughter has not scheduled yet but he plans to follow-up with daughter to discuss a possible new Primary MD in the local area as daughter has made a recommendation.  Husband states it has just been too difficult to try and get patient to [facility].  Neurologist: Dr. Leonie Man - next appt: 05/12/14  Radiologist: Next appt 03/19/14  Cardiologist: Dr. 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  Mobility: Ambulating with no assistive devices post Stroke. Safety:  Husband states interest in learning more about the 24 hour emergency system for wife such as "[employer]."  Husband would like more information on these safety alert systems.   Falls: None  Pain: None  Transportation: Husband Caregiver: Husband Advanced Directives: No  DME: Home BP cuff  THN conditions:  Admissions: 1 02/16/2015 - 02/19/2015 (CVA) ER visits: 1 Husband states no new issues over the past week. Patient is walking with PT and is coping better with her aphasia. ST sessions continued and currently ordered by Dr. Burt Knack. Completed OT services and nearing completion of PT services.  States TRANSESOPHAGEAL ECHOCARDIOGRAM (TEE) completed 03/08/2015 confirming "clotting in the heart."  MD has ordered transition from Eliquis to Coumadin.  Husband able to provide teach back on understanding of instructions and plan using Lovenox 4-5 days to bridge patient over to Coumadin  when labs are correct for transition.  States plan for repeat of TRANSESOPHAGEAL ECHOCARDIOGRAM (TEE) once patient has been on Coumadin for 6 week treatment and labs are within range.   Medications:  Taking less than 10 medications  Co-pay cost issues: none  ASA 81mg  tablet everyday added to medication regime. Patient started 1st dose today (03/05/2015).  New Medication:  Lovenox  - Husband has h/o administering Lovenox several years ago and states the MD office Pharmacist also provided renewal of instruction this event.  States he feels he will do ok.    Plan Referral Date: 02/25/2015 [facility]roke Program services 02/16/2015 H/o patient is not eligible for [facility] services for ACO services and RN CM will continue to follow for [facility] Stroke Program services.  Week 1 02/26/2015 Screening and [facility] Stroke Week 2 03/05/2015 Initial Assessment  Week 3 03/11/2015 Telephone Assessment  [facility] Educational Materials mailed 03/05/2015 and reviewed 03/11/2015.  -Recovering After Stroke -What You Can Do To Prevent A Second Stroke RN CM advised in next contact call (Week 4) Telephone Assessment/[facility] Stroke within one week.  RN CM reviewed stroke recovery. RN CM encouraged patient to journal to log progress.  RN CM discussed risk of falls and fall prevention. RN CM encouraged to take medications as ordered.  Safety RN CM provided education and gave examples of how Safety Alert necklace can aid patient in safety at home. RN CM advised in [facility] SW CM consult to provide additional education on these options.  THN SW CM Referral sent 03/11/2015.    PCP: RN CM sent Dr. Daryl [place] letter and Initial Assessment on 03/05/2015.  RN CM discussed option of local Primary MD for convenience  and to improve ease and compliance of keeping appt's.   Discussed convenience of having local MD for acute care of physicals, vaccines, illness, follow [facility] orders and facility privileges.   RN CM offered to assist with  scheduling appt for patient once patient and husband have selected a new Primary MD.   RN CM advised to please notify MD of any changes in condition prior to scheduled appt's.  RN CM provided contact name and # [phone] or main office # [phone] and 24-hour nurse line # [phone].  RN CM confirmed patient is aware of 911 services for urgent emergency needs.  [name], RN, BSN, [facility], CCM  [employer] Management Coordinator [phone] Direct [phone] Cell [phone] Office [phone] Fax

## 2015-03-11 NOTE — Telephone Encounter (Signed)
I spoke with the pt's spouse and he wanted to make sure that our office is open since Monday is holiday.  I made him aware that we will be open.  He also wanted to clarify again if the pt will be continuing her Aspirin.  I made him aware that the pt does need to continue Aspirin 81mg  once a day.

## 2015-03-11 NOTE — Telephone Encounter (Signed)
Returned patient's call - they had a question about the Lovenox injections that I went over with pt and her husband in clinic yesterday. Her husband was concerned that they received enoxaparin rather than Lovenox. Discussed that enoxaparin is the same medication as Lovenox except generic (and cheaper). Pt's husband also had a few questions regarding the safety cap mechanism of the syringe. All questions were answered - will see them on Monday at Coumadin check.

## 2015-03-11 NOTE — Telephone Encounter (Signed)
New message     husband calling has a medical question for the nurse on Lovenox. Did not disclose any information states she not having any upcoming procedure

## 2015-03-14 ENCOUNTER — Telehealth: Payer: Self-pay | Admitting: Internal Medicine

## 2015-03-14 NOTE — Telephone Encounter (Signed)
The husband of the patient was contacted as a text page was sent to me that he has some questions about heparin. Patient is recently planned to be started on lovenox and warfarin. She has been having bruising with lovenox. Was instructed to apply cool compress and told him to check the back and the hematoma. The hematoma is about 1-2 inch in size and is not growing. It was told to him to come to the ED if the hematoma expands or there is further bruising on the back. The husband of the patient was also instructed to let the pharmacist know in the morning and have INR checked in the AM. Patient has no symptoms of stroke and is doing well otherwise.

## 2015-03-15 ENCOUNTER — Ambulatory Visit (INDEPENDENT_AMBULATORY_CARE_PROVIDER_SITE_OTHER): Payer: 59 | Admitting: Pharmacist

## 2015-03-15 DIAGNOSIS — I480 Paroxysmal atrial fibrillation: Secondary | ICD-10-CM

## 2015-03-15 DIAGNOSIS — I4892 Unspecified atrial flutter: Secondary | ICD-10-CM

## 2015-03-15 DIAGNOSIS — Q2112 Patent foramen ovale: Secondary | ICD-10-CM

## 2015-03-15 DIAGNOSIS — Z5181 Encounter for therapeutic drug level monitoring: Secondary | ICD-10-CM | POA: Diagnosis not present

## 2015-03-15 DIAGNOSIS — I6350 Cerebral infarction due to unspecified occlusion or stenosis of unspecified cerebral artery: Secondary | ICD-10-CM

## 2015-03-15 DIAGNOSIS — Q211 Atrial septal defect: Secondary | ICD-10-CM | POA: Diagnosis not present

## 2015-03-15 LAB — POCT INR: INR: 1.8

## 2015-03-15 NOTE — Progress Notes (Signed)

## 2015-03-16 ENCOUNTER — Other Ambulatory Visit: Payer: Self-pay | Admitting: Licensed Clinical Social Worker

## 2015-03-16 NOTE — Patient Outreach (Addendum)
[facility]) Care Management  03/16/2015  Kendra [name] 08/05/1945 [id number]   Assessment-CSW completed outreach to patient after learning from [facility] that patient is interested in gaining information on life alerts. CSW completed initial outreach on 03/16/15 and was able to successfully reach patient. HIPPA verifications provided. CSW introduced self, reason for call and of [name] social work services. Patient reports that her husband was the one that informed RNCM of their interest in life alert. CSW educated patient on the benefits of having a life alert such as feeling comfortable staying at home, feeling more independent and being protected. Patient was educated on how life alert systems work and their various programs such as land line, cellular and GPS accommodations. CSW informed patient on the low and average cost of life alerts for [facility] which is $25.00 - $35.00 per month. At this time, patient reports no interest in gaining a life alert due to the price stating "That is too expensive. I don't need it that much." CSW encouraged patient to at least have CSW mail out life alert resources and information on various programs in case they were to change their mind in the future. Patient is agreeable to this. CSW will not open case at this time as they are no further social work needs.   Plan-CSW will send message in the form on in basket to Higinio Plan, Care Management Assistant and request that life alert packet be mailed to residence. CSW will inform RNCM that patient is no longer interested in gaining a life alert system. CSW will discharge patient from caseload and will not open case at this time.  Kendra [name], BSW, MSW, [email or web address] Phone: [phone] Fax: [phone]

## 2015-03-16 NOTE — Patient Outreach (Signed)
[facility]) Care Management  03/16/2015  FOX DALLY [birth year] [id number]   Request received from Eula Fried, LCSW to mail patient information on life alert resources. Information mailed 03/16/15.   Jacqulynn Cadet  [facility] Care Management Assistant

## 2015-03-17 ENCOUNTER — Telehealth: Payer: Self-pay | Admitting: Cardiovascular Disease

## 2015-03-17 ENCOUNTER — Ambulatory Visit: Payer: 59 | Attending: Family Medicine

## 2015-03-17 DIAGNOSIS — R4701 Aphasia: Secondary | ICD-10-CM | POA: Insufficient documentation

## 2015-03-17 DIAGNOSIS — R482 Apraxia: Secondary | ICD-10-CM | POA: Insufficient documentation

## 2015-03-17 NOTE — Patient Instructions (Signed)
  Please complete the assigned speech therapy homework each day until your next session.  Speak more complex topics like a telegram. Think about using mostly content words in order to make talking understandable.

## 2015-03-17 NOTE — Telephone Encounter (Signed)
It looks like this was sent to the correct place.  They stated that it seemed like a lot of info.

## 2015-03-17 NOTE — Therapy (Signed)
[facility] [address] [place], [place], [address] Phone: [phone]   Fax:  [phone]  Speech Language Pathology Evaluation  Patient Details  Name: Kendra [name] MRN: [id number] Date of Birth: [birth year] Referring Provider: Sherren Mocha , M.D.  Encounter Date: 03/17/2015      End of Session - 03/17/15 1646    Visit Number 1   Number of Visits 17   Date for SLP Re-Evaluation 05/14/15   Authorization Type [employer] 20 visit limit ST   Authorization - Number of Visits 20   SLP Start Time 1150   SLP Stop Time  1231   SLP Time Calculation (min) 41 min   Activity Tolerance Patient tolerated treatment well      Past Medical History  Diagnosis Date  . 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 Atrial flutter ([place])   . Atrial fibrillation (HCC)     chronic anticoag  . Arthus phenomenon   . Hypercholesterolemia     Pure  . GERD (gastroesophageal reflux disease)   . Kidney infarction [facility])   . TIA (transient ischemic attack)     as per 07/30/00 note from [place]   . Hx of blood clots   . Hemorrhoids     Past Surgical History  Procedure Laterality Date  . Ablasion    . Cesarean section    . Breast biopsy Right   . Appendectomy    . Fibroid tumor removal  1996  . Cardiac electrophysiology study and ablation    . Radiology with anesthesia N/A 02/16/2015    Procedure: RADIOLOGY WITH ANESTHESIA;  Surgeon: Medication Radiologist, MD;  Location: [place] [facility];  Service: Radiology;  Laterality: N/A;  . Tee without cardioversion N/A 03/08/2015    Procedure: TRANSESOPHAGEAL ECHOCARDIOGRAM (TEE);  Surgeon: [name], MD;  Location: [place];  Service: Cardiovascular;  Laterality: N/A;    There were no vitals filed for this visit.  Visit Diagnosis: Expressive aphasia  Verbal apraxia      Subjective Assessment - 03/17/15 1157    Subjective Pt reports having difficulty with dysnomia, as well as paraphasias when  writing and written word generation.   Patient is accompained by: Family member  husband   Currently in Pain? No/denies            SLP Evaluation [facility] - 03/17/15 1157    SLP Visit Information   SLP Received On 03/17/15   Referring Provider Sherren Mocha , M.D.   Onset Date 02-16-15   Medical Diagnosis CVA   General Information   HPI First CVA December 2016. Pt HHST for three weeks prior to arrival at [facility].   Prior Functional Status   Cognitive/Linguistic Baseline Within functional limits   Type of Home House    Lives With Spouse   Vocation Retired   Associate Professor   Overall Cognitive Status Within Functional Limits for tasks assessed   Auditory Comprehension   Overall Auditory Comprehension Appears within functional limits for tasks assessed   Verbal Expression   Overall Verbal Expression Impaired   Level of Generative/Spontaneous Verbalization Sentence;Phrase   Repetition Impaired  mostly by mild apraxia   Level of Impairment Sentence level;Phrase level;Word level  multisyllabic words   Effective Techniques Other (Comment)  Reduced rate, less-complex words   Other Verbal Expression Comments Simple/pleasantry verbal expression appears WNL. However when pt engages in mildly difficult descriptive tasks, verbal abilities decline. Simple picture description req'd SLP questioning cues for comprehension.   Written Expression  Dominant Hand Left   Written Expression Exceptions to [facility]   Dictation Ability Word   Self Formulation Ability Sentence   Overall Writen Expression In a thank you note task, pt had typical semantic aphasic error in two simple sentences. She also had difficulty spelling nonsense words (1/4), and oral spelling (4/5).    Oral Motor/Sensory Function   Overall Oral Motor/Sensory Function Appears within functional limits for tasks assessed   Motor Speech   Overall Motor Speech Impaired  very mild   Articulation Within functional limitis   Intelligibility  Intelligible   Motor Planning Impaired   Level of Impairment Conversation   Motor Speech Errors Aware;Consistent                         SLP Education - 2015/03/22 1447    Education provided Yes   Education Details therapy course, eval results, home tasks, compensations   Person(s) Educated Patient;Spouse   Methods Explanation;Demonstration   Comprehension Verbalized understanding;Need further instruction  further instruction for compensations          SLP Short Term Goals - Mar 22, 2015 1651    SLP SHORT TERM GOAL #1   Title pt will write short notes with independence, given self-correction   Time 4   Period Weeks   Status New   SLP SHORT TERM GOAL #2   Title pt will engage in mod complex description tasks with 90% success and use of strategies   Time 4   Period Weeks   Status New   SLP SHORT TERM GOAL #3   Title pt to demo simple conversation with use of compensations with rare min A   Time 4   Period Weeks   Status New          SLP Long Term Goals - 03/22/2015 1654    SLP LONG TERM GOAL #1   Title pt to write short paragraphs (5-7 sentences) and ID errors independently   Time 8   Period Weeks   Status New   SLP LONG TERM GOAL #2   Title pt will engage in 10 minutes mod complex conversation using compensations, with rare min A   Time 8   Period Weeks   Status New   SLP LONG TERM GOAL #3   Title pt will engage in 5 minutes complex conversation using compensations, with occasional min A   Time 8   Period Weeks   Status New          Plan - 22-Mar-2015 1647    Clinical Impression Statement Pt presents today with mod expressive aphasia, worsening as linguilstic demands increase. Pt's pleasantries and simple initial language in conversation appear WNL, however when conversation becomes mildly more detailed and complex pt's success decreases and pt 's verbal expression is very vague, requiring questions from her listener. Pt's written expression is  moderately impaired at the sentence/multisentence level. 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 Pt would benefit from 8 weeks of skilled ST to treat reduced verbal expression to improve her QOL.   Speech Therapy Frequency 2x / week   Duration --  8 weeks   Treatment/Interventions Language facilitation;Cueing hierarchy;Multimodal communcation approach;Functional tasks;Patient/family education;Compensatory strategies;SLP instruction and feedback;Internal/external aids          G-Codes - 03/22/15 1448    Functional Assessment Tool Used noms-50% impaired   Functional Limitations Spoken language expressive   Spoken Language Expression Current Status [phone]) At least 40 percent but  less than 60 percent impaired, limited or restricted   Spoken Language Expression Goal Status [phone]) At least 1 percent but less than 20 percent impaired, limited or restricted      Problem List Patient Active Problem List   Diagnosis Date Noted  . Encounter for therapeutic drug monitoring 03/15/2015  . PFO (patent foramen ovale) 02/19/2015  . Acute respiratory failure with hypoxia ([place]) 02/16/2015  . Essential hypertension 02/16/2015  . Acute encephalopathy 02/16/2015  . 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 Insomnia 01/26/2011  . GERD 02/16/2009  . TRANSIENT ISCHEMIC ATTACKS, HX OF 02/16/2009  . CHICKENPOX, HX OF 02/16/2009  . Essential hypertension, benign 12/04/2008  . Atrial fibrillation ([place]) 11/18/2008  . ATRIAL FLUTTER 11/18/2008  . PURE HYPERCHOLESTEROLEMIA 07/20/2008  . ARTHUS PHENOMENON 07/20/2008    [facility] , MS, CCC-SLP  03/17/2015, 4:59 PM  [facility] [address] [place], [place], [address] Phone: [phone]   Fax:  [phone]  Name: Kendra [name] MRN: [id number] Date of Birth: 08-25-1945

## 2015-03-17 NOTE — Telephone Encounter (Signed)
New Message  RN from [place] stated that office notes were sent accidentally to their office and wanted to make sure it was faxed to the correct location. Please call back and discuss.

## 2015-03-18 ENCOUNTER — Ambulatory Visit: Payer: Self-pay

## 2015-03-19 ENCOUNTER — Ambulatory Visit (INDEPENDENT_AMBULATORY_CARE_PROVIDER_SITE_OTHER): Payer: 59 | Admitting: *Deleted

## 2015-03-19 ENCOUNTER — Telehealth: Payer: Self-pay | Admitting: Cardiovascular Disease

## 2015-03-19 ENCOUNTER — Other Ambulatory Visit: Payer: Self-pay

## 2015-03-19 DIAGNOSIS — I480 Paroxysmal atrial fibrillation: Secondary | ICD-10-CM | POA: Diagnosis not present

## 2015-03-19 DIAGNOSIS — I6350 Cerebral infarction due to unspecified occlusion or stenosis of unspecified cerebral artery: Secondary | ICD-10-CM

## 2015-03-19 DIAGNOSIS — Z5181 Encounter for therapeutic drug level monitoring: Secondary | ICD-10-CM | POA: Diagnosis not present

## 2015-03-19 DIAGNOSIS — Q211 Atrial septal defect: Secondary | ICD-10-CM | POA: Diagnosis not present

## 2015-03-19 DIAGNOSIS — I4892 Unspecified atrial flutter: Secondary | ICD-10-CM

## 2015-03-19 DIAGNOSIS — Q2112 Patent foramen ovale: Secondary | ICD-10-CM

## 2015-03-19 LAB — POCT INR: INR: 3.2

## 2015-03-19 NOTE — Patient Outreach (Signed)
[facility]) Care Management  03/19/2015  ANGELIQUA MUSTO 1945/09/27 [id number]   Emmi Stroke Program Week 4  Outreach call to patient.  Patient or husband reached.  RN CM left HIPAA compliant voice message with name and number.  RN CM scheduled for next outreach call within one week.  [name], RN, BSN, [facility], CCM  [employer] Management Coordinator [phone] Direct [phone] Cell [phone] Office [phone] Fax

## 2015-03-19 NOTE — Telephone Encounter (Signed)
I spoke with the pt and she would like to proceed with office visit with Dr Beryle Beams. I will arrange appointment.

## 2015-03-19 NOTE — Telephone Encounter (Signed)
New message  ° ° °Patient returning call back to nurse from yesterday.   °

## 2015-03-19 NOTE — Telephone Encounter (Signed)
Left message on machine for pt to contact the office.   

## 2015-03-24 ENCOUNTER — Ambulatory Visit (HOSPITAL_COMMUNITY)
Admission: RE | Admit: 2015-03-24 | Discharge: 2015-03-24 | Disposition: A | Discharge status: Home / Self Care | Payer: Medicare Other | Source: Ambulatory Visit | Attending: Interventional Radiology | Admitting: Interventional Radiology

## 2015-03-24 ENCOUNTER — Ambulatory Visit: Payer: 59

## 2015-03-24 DIAGNOSIS — I6350 Cerebral infarction due to unspecified occlusion or stenosis of unspecified cerebral artery: Secondary | ICD-10-CM

## 2015-03-24 DIAGNOSIS — R4701 Aphasia: Secondary | ICD-10-CM | POA: Diagnosis not present

## 2015-03-24 DIAGNOSIS — R482 Apraxia: Secondary | ICD-10-CM

## 2015-03-24 NOTE — Therapy (Signed)
[facility] [address] [place], [place], [address] Phone: [phone]   Fax:  [phone]  Speech Language Pathology Treatment  Patient Details  Name: Kendra [name] MRN: [id number] Date of Birth: [birth year] Referring Provider: Sherren Mocha , M.D.  Encounter Date: 03/24/2015      End of Session - 03/24/15 1156    Visit Number 2   Number of Visits 17   Date for SLP Re-Evaluation 05/14/15   Authorization - Visit Number 1   Authorization - Number of Visits 20   SLP Start Time [id number]   SLP Stop Time  1150   SLP Time Calculation (min) 45 min      Past Medical History  Diagnosis Date  . 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 Atrial flutter ([place])   . Atrial fibrillation (HCC)     chronic anticoag  . Arthus phenomenon   . Hypercholesterolemia     Pure  . GERD (gastroesophageal reflux disease)   . Kidney infarction [facility])   . TIA (transient ischemic attack)     as per 07/30/00 note from [place]   . Hx of blood clots   . Hemorrhoids     Past Surgical History  Procedure Laterality Date  . Ablasion    . Cesarean section    . Breast biopsy Right   . Appendectomy    . Fibroid tumor removal  1996  . Cardiac electrophysiology study and ablation    . Radiology with anesthesia N/A 02/16/2015    Procedure: RADIOLOGY WITH ANESTHESIA;  Surgeon: Medication Radiologist, MD;  Location: [place] [facility];  Service: Radiology;  Laterality: N/A;  . Tee without cardioversion N/A 03/08/2015    Procedure: TRANSESOPHAGEAL ECHOCARDIOGRAM (TEE);  Surgeon: [name], MD;  Location: [facility];  Service: Cardiovascular;  Laterality: N/A;    There were no vitals filed for this visit.  Visit Diagnosis: Expressive aphasia  Verbal apraxia      Subjective Assessment - 03/24/15 1108    Subjective Pt reports talking to people this past weekend and self-correcting PRN.   Patient is accompained by: Family member  husband                ADULT SLP TREATMENT - 03/24/15 1110    General Information   Behavior/Cognition Alert;Cooperative;Pleasant mood   Treatment Provided   Treatment provided Cognitive-Linquistic   Pain Assessment   Pain Assessment No/denies pain   Cognitive-Linquistic Treatment   Treatment focused on Aphasia   Skilled Treatment SLP engaged pt with verbal expression tasks in order to improve her expressive language skills. SLP used pt's husband as the listener (in barrier tasks).  Pt was [facility] with simple descriptions with usual min cues for rate reduction; SLP did not have to ask questions for follow up. With safety picture descriptions, pt was functional 70% of the time and req'd follow up questions from SLP for clarification.  SLP explained how to do picture description task at home for practice. Educated pt's husband on difference between apraxic and aphasic errors and bases for each disorder.   Assessment / Recommendations / Plan   Plan Continue with current plan of care   Progression Toward Goals   Progression toward goals Progressing toward goals          SLP Education - 03/24/15 1156    Education provided Yes   Education Details apraxia vs. aphasia, home tasks, compensations (reduced rate)   Person(s) Educated Patient;Spouse   Methods Explanation;Demonstration;Verbal cues  Comprehension Verbalized understanding;Returned demonstration;Verbal cues required;Need further instruction          SLP Short Term Goals - 03/24/15 1157    SLP SHORT TERM GOAL #1   Title pt will write short notes with independence, given self-correction   Time 4   Period Weeks   Status On-going   SLP SHORT TERM GOAL #2   Title pt will engage in mod complex description tasks with 90% success and use of strategies   Time 4   Period Weeks   Status On-going   SLP SHORT TERM GOAL #3   Title pt to demo simple conversation with use of compensations with rare min A   Time 4   Period Weeks   Status  On-going          SLP Long Term Goals - 03/24/15 1158    SLP LONG TERM GOAL #1   Title pt to write short paragraphs (5-7 sentences) and ID errors independently   Time 8   Period Weeks   Status On-going   SLP LONG TERM GOAL #2   Title pt will engage in 10 minutes mod complex conversation using compensations, with rare min A   Time 8   Period Weeks   Status On-going   SLP LONG TERM GOAL #3   Title pt will engage in 5 minutes complex conversation using compensations, with occasional min A   Time 8   Period Weeks   Status On-going          Plan - 03/24/15 1157    Clinical Impression Statement Pt presents today with mod expressive aphasia, worsening as linguilstic demands increased in picture description tasks today.  Pt would cont to benefit from  skilled ST to treat reduced verbal expression to improve her QOL.   Speech Therapy Frequency 2x / week   Duration --  8 weeks   Treatment/Interventions Language facilitation;Cueing hierarchy;Multimodal communcation approach;Functional tasks;Patient/family education;Compensatory strategies;SLP instruction and feedback;Internal/external aids        Problem List Patient Active Problem List   Diagnosis Date Noted  . Encounter for therapeutic drug monitoring 03/15/2015  . PFO (patent foramen ovale) 02/19/2015  . Acute respiratory failure with hypoxia ([place]) 02/16/2015  . Essential hypertension 02/16/2015  . Acute encephalopathy 02/16/2015  . 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 Insomnia 01/26/2011  . GERD 02/16/2009  . TRANSIENT ISCHEMIC ATTACKS, HX OF 02/16/2009  . CHICKENPOX, HX OF 02/16/2009  . Essential hypertension, benign 12/04/2008  . Atrial fibrillation ([place]) 11/18/2008  . ATRIAL FLUTTER 11/18/2008  . PURE HYPERCHOLESTEROLEMIA 07/20/2008  . ARTHUS PHENOMENON 07/20/2008    Kendra [name] , MS, CCC-SLP 03/24/2015, 11:58 AM  [facility] [address] [place], [place], [address] Phone: [phone]   Fax:  [phone]   Name: Kendra [name] MRN: [id number] Date of Birth: 1945/03/02

## 2015-03-25 ENCOUNTER — Ambulatory Visit: Payer: 59

## 2015-03-25 ENCOUNTER — Other Ambulatory Visit: Payer: Self-pay

## 2015-03-25 DIAGNOSIS — R482 Apraxia: Secondary | ICD-10-CM

## 2015-03-25 DIAGNOSIS — R4701 Aphasia: Secondary | ICD-10-CM

## 2015-03-25 NOTE — Patient Outreach (Signed)
[facility]) Care Management  03/25/2015  Kendra [name] 24-Dec-1945 [id number]   Emmi Stroke Program Week 4 Telephone Assessment  Admission: 02/16/2015 - 02/19/2015 (CVA)  Providers: New local Primary MD:  Dr. Derrel Nip - 1st appt scheduled for 04/27/2015.  Previous Primary MD: Dr. Cecille Amsterdam. [facility], [address] [place], [place] [address] - [phone] Neurologist: Dr. Leonie Man - 1st appt: 05/12/14 (Husband states post hospital discharge recommendations:  See Neurologist within 60 days but MD had no appt's open until 3/15.  States requested to be placed on wait list for earlier appt but no new updates from MD office.  Husband would prefer to see MD earlier than later in order to discuss patients health and address questions/concerns.  Radiologist: last  appt 03/19/14  Cardiologist: Dr. Burt Knack - last appt 03/09/14 Oncologist:  Dr. 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    Mobility: Ambulating with no assistive devices post Stroke. Safety: Husband is still working and is concerned with leaving patient alone at home at all.  Husband has been provided [facility] SW services to provide information on safety alert system options.  Husband states that [facility] has also provided him with information but he is still reviewing the information and has not made a definite decision yet.   RN CM discussed first responders are usually with local fire department; encouraged to discuss concerns of establishing system with fire department for house entry if husband is not home at the time of need to establish plan.   Falls: None  Pain: None  Transportation: Husband.  Husband ask "When will Kendra [name] be able to drive again?"   RN CM has open  discussion relating to patients speech could give perception of being impaired and reactions to quick stops, red lights, and reaction time can create a safety concern.  Advised to discuss with all MD's on follow-up care and not to allow patient to drive until Dr. Leonie Man has released patient to drive.  Caregiver: Husband, Kendra [name] Advanced Directives: No.  Husband elects NOT to have Advanced Directive due to his past experience as an attorney with the document being misunderstood.  Husband and patient are well educated to the pros and cons of having this document and have made decision not to complete.   DME: Home BP cuff  THN conditions:  Admissions: 1 02/16/2015 - 02/19/2015 (CVA) ER visits: 1 Husband states no new issues over the past week. Patient is walking with PT and is coping better with her aphasia. ST sessions continued and currently ordered by Dr. Burt Knack. Completed OT/PT services. Patient able to walk a couple of miles yesterday.  H/o TRANSESOPHAGEAL ECHOCARDIOGRAM (TEE) completed 03/08/2015 confirming "clotting in the heart."  MD has ordered transition from Eliquis to Coumadin. H/o patient bridged with Lovenox 4-5 days to bridge patient over to Coumadin.  Plan:  TRANSESOPHAGEAL ECHOCARDIOGRAM (TEE) once patient has been on Coumadin for 6 week treatment and labs are within range. Emmi Educational Materials mailed 03/05/2015 (reviewed 03/11/2015).  -Recovering After Stroke -What You Can Do To Prevent A Second Stroke  Medications:  Taking less than 10 medications  Co-pay cost issues: none  ASA 81 mg tablet everyday added to medication regime. Patient started 1st dose today (03/05/2015).  New Medication: Coumadin  Plan  Referral Date: 02/25/2015 Emmi Stroke Program services 02/16/2015 H/o patient is NOT eligible for [facility] services for ACO services on initial admission to [facility] services and RN CM will continue to follow for [facility] Stroke Program services.  Week 1 02/26/2015  Screening and Emmi Stroke Week 2 03/05/2015 Initial Assessment  Week 3 03/11/2015 Telephone Assessment Week 4 03/25/2015 Telephone Assessment   Safety RN CM provided open discussion on no driving until released by MD to drive and safety alert systems  Advanced Directives Goals of care met:  Discussed and caregiver, husband and patient elect not to create document.   Neurology appt with Dr. Leonie Man -RN CM will contact MD office for earlier appt request.    RN CM advised in next contact call (Week 5) Telephone Assessment/Emmi Stroke within one week for update on follow-up appt with Dr. Leonie Man.  RN CM reviewed stroke recovery.    RN CM advised to please notify MD of any changes in condition prior to scheduled appt's.  RN CM provided contact name and # [phone] or main office # [phone] and 24-hour nurse line # [phone].  RN CM confirmed patient is aware of 911 services for urgent emergency needs.  [name], RN, BSN, [facility], CCM  [employer] Management Coordinator [phone] Direct [phone] Cell [phone] Office [phone] Fax

## 2015-03-25 NOTE — Patient Outreach (Addendum)
[facility]) Care Management  03/25/2015  Kendra [name] 1945/10/08 [id number]   Care Coordination Services  Outreach call to Neurologist, Dr. Leonie Man  [phone] Issue:  Neurologist: Dr. Leonie Man - 1st appt: 05/12/14 (Husband states post hospital discharge recommendations: See Neurologist within 60 days but MD had no appt's open until 3/15. States requested to be placed on wait list for earlier appt but no new updates from MD office. Husband would prefer to see MD earlier than later in order to discuss patients health and address questions/concerns.  Discharge Instructions:  Follow-up with Dr. Antony Contras, Stroke Clinic in 2 months. Admission: 02/16/2015 - 02/19/2015 (CVA)  Office Staff contact confirms that no other appt available at this time but has no history of patient being on waiting list as husband believed he had completed the request through MD's on-line system.  Contact confirms patient has been place on wait list.   RN CM updated that patient and husband have questions and prefer appointment as soon as appt becomes available.  RN CM advised that discharge instructions to family were to see MD in 2 months.     Outreach call to patient and update provided.[name], RN, BSN, [facility], [place] Management Care Management Coordinator [phone] Direct [phone] Cell [phone] Office [phone] Fax

## 2015-03-25 NOTE — Telephone Encounter (Signed)
Pt scheduled to see Dr Beryle Beams 04/12/15.

## 2015-03-25 NOTE — Therapy (Signed)
[facility] [address] [place], [place], [address] Phone: [phone]   Fax:  [phone]  Speech Language Pathology Treatment  Patient Details  Name: Kendra [name] MRN: [id number] Date of Birth: 11/21/1945 Referring Provider: Sherren Mocha , M.D.  Encounter Date: 03/25/2015      End of Session - 03/25/15 1451    Visit Number 3   Number of Visits 17   Date for SLP Re-Evaluation 05/14/15   Authorization - Visit Number 2   Authorization - Number of Visits 20   SLP Start Time [id number]   SLP Stop Time  1446   SLP Time Calculation (min) 42 min   Activity Tolerance Patient tolerated treatment well      Past Medical History  Diagnosis Date  . 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 Atrial flutter ([place])   . Atrial fibrillation (HCC)     chronic anticoag  . Arthus phenomenon   . Hypercholesterolemia     Pure  . GERD (gastroesophageal reflux disease)   . Kidney infarction [facility])   . TIA (transient ischemic attack)     as per 07/30/00 note from [place]   . Hx of blood clots   . Hemorrhoids     Past Surgical History  Procedure Laterality Date  . Ablasion    . Cesarean section    . Breast biopsy Right   . Appendectomy    . Fibroid tumor removal  1996  . Cardiac electrophysiology study and ablation    . Radiology with anesthesia N/A 02/16/2015    Procedure: RADIOLOGY WITH ANESTHESIA;  Surgeon: Medication Radiologist, MD;  Location: [facility];  Service: Radiology;  Laterality: N/A;  . Tee without cardioversion N/A 03/08/2015    Procedure: TRANSESOPHAGEAL ECHOCARDIOGRAM (TEE);  Surgeon: [name], MD;  Location: [facility];  Service: Cardiovascular;  Laterality: N/A;    There were no vitals filed for this visit.  Visit Diagnosis: Expressive aphasia  Verbal apraxia      Subjective Assessment - 03/25/15 1444    Patient is accompained by: --  husband, jim               ADULT SLP TREATMENT -  03/25/15 1444    General Information   Behavior/Cognition Alert;Cooperative;Pleasant mood   Treatment Provided   Treatment provided Cognitive-Linquistic   Cognitive-Linquistic Treatment   Treatment focused on Aphasia   Skilled Treatment Faciliated pt's expressive language with synonyms task which req'd SLP mod-max A consistently, and a confrontational naming task which req'd occasional mod A from SLP. SLP educated pt husband how to cue with heirarchy at home.   Assessment / Recommendations / Plan   Plan Continue with current plan of care   Progression Toward Goals   Progression toward goals Progressing toward goals          SLP Education - 03/25/15 1450    Education provided Yes   Education Details cueing for language athome   Person(s) Educated Patient;Spouse   Methods Explanation;Demonstration   Comprehension Verbalized understanding          SLP Short Term Goals - 03/24/15 1157    SLP SHORT TERM GOAL #1   Title pt will write short notes with independence, given self-correction   Time 4   Period Weeks   Status On-going   SLP SHORT TERM GOAL #2   Title pt will engage in mod complex description tasks with 90% success and use of strategies   Time 4  Period Weeks   Status On-going   SLP SHORT TERM GOAL #3   Title pt to demo simple conversation with use of compensations with rare min A   Time 4   Period Weeks   Status On-going          SLP Long Term Goals - 03/24/15 1158    SLP LONG TERM GOAL #1   Title pt to write short paragraphs (5-7 sentences) and ID errors independently   Time 8   Period Weeks   Status On-going   SLP LONG TERM GOAL #2   Title pt will engage in 10 minutes mod complex conversation using compensations, with rare min A   Time 8   Period Weeks   Status On-going   SLP LONG TERM GOAL #3   Title pt will engage in 5 minutes complex conversation using compensations, with occasional min A   Time 8   Period Weeks   Status On-going           Plan - 03/25/15 1451    Clinical Impression Statement Mod expressive aphasia, worsening as linguilstic demands increase, demonstrated in synonym tasks.  Pt would cont to benefit from  skilled ST to treat reduced verbal expression to improve her QOL.   Speech Therapy Frequency 2x / week   Duration --  8 weeks   Treatment/Interventions Language facilitation;Cueing hierarchy;Multimodal communcation approach;Functional tasks;Patient/family education;Compensatory strategies;SLP instruction and feedback;Internal/external aids        Problem List Patient Active Problem List   Diagnosis Date Noted  . Encounter for therapeutic drug monitoring 03/15/2015  . PFO (patent foramen ovale) 02/19/2015  . Acute respiratory failure with hypoxia ([place]) 02/16/2015  . Essential hypertension 02/16/2015  . Acute encephalopathy 02/16/2015  . 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 Insomnia 01/26/2011  . GERD 02/16/2009  . TRANSIENT ISCHEMIC ATTACKS, HX OF 02/16/2009  . CHICKENPOX, HX OF 02/16/2009  . Essential hypertension, benign 12/04/2008  . Atrial fibrillation ([place]) 11/18/2008  . ATRIAL FLUTTER 11/18/2008  . PURE HYPERCHOLESTEROLEMIA 07/20/2008  . ARTHUS PHENOMENON 07/20/2008    [facility] , MS, CCC-SLP  03/25/2015, 2:52 PM  [facility] [address] [place], [place], [address] Phone: [phone]   Fax:  [phone]   Name: Kendra [name] MRN: [id number] Date of Birth: [birth year]

## 2015-03-26 ENCOUNTER — Ambulatory Visit (INDEPENDENT_AMBULATORY_CARE_PROVIDER_SITE_OTHER): Payer: 59 | Admitting: Surgery

## 2015-03-26 DIAGNOSIS — I6350 Cerebral infarction due to unspecified occlusion or stenosis of unspecified cerebral artery: Secondary | ICD-10-CM | POA: Diagnosis not present

## 2015-03-26 DIAGNOSIS — Z5181 Encounter for therapeutic drug level monitoring: Secondary | ICD-10-CM

## 2015-03-26 DIAGNOSIS — I480 Paroxysmal atrial fibrillation: Secondary | ICD-10-CM | POA: Diagnosis not present

## 2015-03-26 DIAGNOSIS — I4892 Unspecified atrial flutter: Secondary | ICD-10-CM

## 2015-03-26 DIAGNOSIS — Q211 Atrial septal defect: Secondary | ICD-10-CM | POA: Diagnosis not present

## 2015-03-26 DIAGNOSIS — Q2112 Patent foramen ovale: Secondary | ICD-10-CM

## 2015-03-26 LAB — POCT INR: INR: 5

## 2015-03-30 ENCOUNTER — Ambulatory Visit: Payer: 59

## 2015-03-30 DIAGNOSIS — R4701 Aphasia: Secondary | ICD-10-CM

## 2015-03-30 DIAGNOSIS — R482 Apraxia: Secondary | ICD-10-CM

## 2015-03-30 NOTE — Therapy (Signed)
[facility] [address] [place], [place], [address] Phone: [phone]   Fax:  [phone]  Speech Language Pathology Treatment  Patient Details  Name: Kendra [name] MRN: [id number] Date of Birth: 1945-09-08 Referring Provider: Sherren Mocha , M.D.  Encounter Date: 03/30/2015      End of Session - 03/30/15 1105    Visit Number 4   Number of Visits 17   Date for SLP Re-Evaluation 05/14/15   Authorization Type [employer] 20 visit limit ST   Authorization - Visit Number 3   Authorization - Number of Visits 20   SLP Start Time [id number]   SLP Stop Time  1101   SLP Time Calculation (min) 43 min   Activity Tolerance Patient tolerated treatment well      Past Medical History  Diagnosis Date  . 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 Atrial flutter ([place])   . Atrial fibrillation (HCC)     chronic anticoag  . Arthus phenomenon   . Hypercholesterolemia     Pure  . GERD (gastroesophageal reflux disease)   . Kidney infarction [facility])   . TIA (transient ischemic attack)     as per 07/30/00 note from [facility]   . Hx of blood clots   . Hemorrhoids     Past Surgical History  Procedure Laterality Date  . Ablasion    . Cesarean section    . Breast biopsy Right   . Appendectomy    . Fibroid tumor removal  1996  . Cardiac electrophysiology study and ablation    . Radiology with anesthesia N/A 02/16/2015    Procedure: RADIOLOGY WITH ANESTHESIA;  Surgeon: Medication Radiologist, MD;  Location: [facility];  Service: Radiology;  Laterality: N/A;  . Tee without cardioversion N/A 03/08/2015    Procedure: TRANSESOPHAGEAL ECHOCARDIOGRAM (TEE);  Surgeon: [name], MD;  Location: [facility];  Service: Cardiovascular;  Laterality: N/A;    There were no vitals filed for this visit.  Visit Diagnosis: Expressive aphasia  Verbal apraxia      Subjective Assessment - 03/30/15 1022    Subjective Pt reports going to dinner as well as  swim meets over the weekend. Not as correct with her speech as she would like.   Patient is accompained by: --  husband               ADULT SLP TREATMENT - 03/30/15 1025    General Information   Behavior/Cognition Alert;Cooperative;Pleasant mood   Treatment Provided   Treatment provided Cognitive-Linquistic   Cognitive-Linquistic Treatment   Treatment focused on Aphasia   Skilled Treatment Pt explained to SLP about weekend activities and told SLP with fast rate and aphasic errors. SLP asked pt about her rate, as discussed last session adn she was reportedly using slow rate. SLP reviewed reasons for slowed rate, and the means to accomplishing a reduction in speech rate. SLP practiced with sentence responses and pt with 70% success with fluent speech void of aphasic errors.   Assessment / Recommendations / Plan   Plan Continue with current plan of care   Progression Toward Goals   Progression toward goals Progressing toward goals          SLP Education - 03/30/15 1105    Education provided Yes   Education Details cueing heirarchy for home tasks   Person(s) Educated Spouse;Patient   Methods Explanation   Comprehension Verbalized understanding          SLP Short Term Goals -  03/30/15 1106    SLP SHORT TERM GOAL #1   Title pt will write short notes with independence, given self-correction   Time 3   Period Weeks   Status On-going   SLP SHORT TERM GOAL #2   Title pt will engage in mod complex description tasks with 90% success and use of strategies   Time 3   Period Weeks   Status On-going   SLP SHORT TERM GOAL #3   Title pt to demo simple conversation with use of compensations with rare min A   Time 3   Period Weeks   Status On-going          SLP Long Term Goals - 03/30/15 1107    SLP LONG TERM GOAL #1   Title pt to write short paragraphs (5-7 sentences) and ID errors independently   Time 7   Period Weeks   Status On-going   SLP LONG TERM GOAL #2    Title pt will engage in 10 minutes mod complex conversation using compensations, with rare min A   Time 7   Period Weeks   Status On-going   SLP LONG TERM GOAL #3   Title pt will engage in 5 minutes complex conversation using compensations, with occasional min A   Time 7   Period Weeks   Status On-going          Plan - 03/30/15 1106    Clinical Impression Statement Mod expressive aphasia, worsening as linguilstic demands increase, demonstrated in synonym tasks.  SLP focused more on compensations today - req'd SLP cues. Pt would cont to benefit from  skilled ST to treat reduced verbal expression to improve her QOL.   Speech Therapy Frequency 2x / week   Duration --  7 weeks   Treatment/Interventions Language facilitation;Cueing hierarchy;Multimodal communcation approach;Functional tasks;Patient/family education;Compensatory strategies;SLP instruction and feedback;Internal/external aids   Potential to Achieve Goals Good        Problem List Patient Active Problem List   Diagnosis Date Noted  . Encounter for therapeutic drug monitoring 03/15/2015  . PFO (patent foramen ovale) 02/19/2015  . Acute respiratory failure with hypoxia ([place]) 02/16/2015  . Essential hypertension 02/16/2015  . Acute encephalopathy 02/16/2015  . 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 Insomnia 01/26/2011  . GERD 02/16/2009  . TRANSIENT ISCHEMIC ATTACKS, HX OF 02/16/2009  . CHICKENPOX, HX OF 02/16/2009  . Essential hypertension, benign 12/04/2008  . Atrial fibrillation ([place]) 11/18/2008  . ATRIAL FLUTTER 11/18/2008  . PURE HYPERCHOLESTEROLEMIA 07/20/2008  . ARTHUS PHENOMENON 07/20/2008    [facility] , MS, CCC-SLP  03/30/2015, 11:07 AM  [facility] [address] [place], [place], [address] Phone: [phone]   Fax:  [phone]   Name: Kendra [name] MRN: [id number] Date of Birth: 1945/05/01

## 2015-04-01 ENCOUNTER — Ambulatory Visit: Payer: 59 | Attending: Family Medicine

## 2015-04-01 DIAGNOSIS — R482 Apraxia: Secondary | ICD-10-CM | POA: Diagnosis present

## 2015-04-01 DIAGNOSIS — R4701 Aphasia: Secondary | ICD-10-CM

## 2015-04-01 NOTE — Therapy (Signed)
[facility] [address] [place], [place], [address] Phone: [phone]   Fax:  [phone]  Speech Language Pathology Treatment  Patient Details  Name: Kendra [name] MRN: [id number] Date of Birth: December 10, 1945 Referring Provider: Sherren Mocha , M.D.  Encounter Date: 04/01/2015      End of Session - 04/01/15 1208    Visit Number 5   Number of Visits 17   Date for SLP Re-Evaluation 05/14/15   Authorization Type BCBS 20 visit limit ST   Authorization - Visit Number 4   Authorization - Number of Visits 20   SLP Start Time [id number]   SLP Stop Time  1145   SLP Time Calculation (min) 42 min   Activity Tolerance Patient tolerated treatment well      Past Medical History  Diagnosis Date  . 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 Atrial flutter ([place])   . Atrial fibrillation (HCC)     chronic anticoag  . Arthus phenomenon   . Hypercholesterolemia     Pure  . GERD (gastroesophageal reflux disease)   . Kidney infarction [facility])   . TIA (transient ischemic attack)     as per 07/30/00 note from [place]   . Hx of blood clots   . Hemorrhoids     Past Surgical History  Procedure Laterality Date  . Ablasion    . Cesarean section    . Breast biopsy Right   . Appendectomy    . Fibroid tumor removal  1996  . Cardiac electrophysiology study and ablation    . Radiology with anesthesia N/A 02/16/2015    Procedure: RADIOLOGY WITH ANESTHESIA;  Surgeon: Medication Radiologist, MD;  Location: [facility];  Service: Radiology;  Laterality: N/A;  . Tee without cardioversion N/A 03/08/2015    Procedure: TRANSESOPHAGEAL ECHOCARDIOGRAM (TEE);  Surgeon: [name], MD;  Location: [facility];  Service: Cardiovascular;  Laterality: N/A;    There were no vitals filed for this visit.  Visit Diagnosis: Expressive aphasia  Verbal apraxia      Subjective Assessment - 04/01/15 1106    Subjective Pt walked with a friend yesterday and  reports that friend didn't ask for clarification nor repeats during the walk.   Patient is accompained by: --  husband   Currently in Pain? No/denies               ADULT SLP TREATMENT - 04/01/15 1108    General Information   Behavior/Cognition Alert;Cooperative;Pleasant mood   Treatment Provided   Treatment provided Cognitive-Linquistic   Cognitive-Linquistic Treatment   Treatment focused on Aphasia   Skilled Treatment SLP facilitated pt's expressive language by using mod complex naming tasks (responsive, convergent). She req'd SLP min-mod A occasionally. Educated pt's husband on cuing heirarchy for tasks copmleted today - sent home as homework.   Assessment / Recommendations / Plan   Plan Continue with current plan of care   Progression Toward Goals   Progression toward goals Progressing toward goals          SLP Education - 04/01/15 1208    Education provided Yes   Education Details cueing heirarchy for home tasks   Person(s) Educated Patient;Spouse   Methods Explanation;Demonstration   Comprehension Verbalized understanding          SLP Short Term Goals - 04/01/15 1210    SLP SHORT TERM GOAL #1   Title pt will write short notes with independence, given self-correction   Time 3   Period  Weeks   Status On-going   SLP SHORT TERM GOAL #2   Title pt will engage in mod complex description tasks with 90% success and use of strategies   Time 3   Period Weeks   Status On-going   SLP SHORT TERM GOAL #3   Title pt to demo simple conversation with use of compensations with rare min A   Time 3   Period Weeks   Status On-going          SLP Long Term Goals - 03/30/15 1107    SLP LONG TERM GOAL #1   Title pt to write short paragraphs (5-7 sentences) and ID errors independently   Time 7   Period Weeks   Status On-going   SLP LONG TERM GOAL #2   Title pt will engage in 10 minutes mod complex conversation using compensations, with rare min A   Time 7   Period  Weeks   Status On-going   SLP LONG TERM GOAL #3   Title pt will engage in 5 minutes complex conversation using compensations, with occasional min A   Time 7   Period Weeks   Status On-going          Plan - 04/01/15 1209    Clinical Impression Statement Mod expressive aphasia, worsening as linguilstic demands increase. Pt reprots using slower rate more outside of ST but still requires practice to habitualize/use more often. Pt would cont to benefit from  skilled ST to treat reduced verbal expression to improve her QOL.   Speech Therapy Frequency 2x / week   Duration --  7 weeks   Treatment/Interventions Language facilitation;Cueing hierarchy;Multimodal communcation approach;Functional tasks;Patient/family education;Compensatory strategies;SLP instruction and feedback;Internal/external aids   Potential to Achieve Goals Good        Problem List Patient Active Problem List   Diagnosis Date Noted  . Encounter for therapeutic drug monitoring 03/15/2015  . PFO (patent foramen ovale) 02/19/2015  . Acute respiratory failure with hypoxia ([place]) 02/16/2015  . Essential hypertension 02/16/2015  . Acute encephalopathy 02/16/2015  . 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 Insomnia 01/26/2011  . GERD 02/16/2009  . TRANSIENT ISCHEMIC ATTACKS, HX OF 02/16/2009  . CHICKENPOX, HX OF 02/16/2009  . Essential hypertension, benign 12/04/2008  . Atrial fibrillation ([place]) 11/18/2008  . ATRIAL FLUTTER 11/18/2008  . PURE HYPERCHOLESTEROLEMIA 07/20/2008  . ARTHUS PHENOMENON 07/20/2008    [facility] , MS, CCC-SLP  04/01/2015, 12:10 PM  [facility] [address] [place], [place], [address] Phone: [phone]   Fax:  [phone]   Name: Kendra [name] MRN: [id number] Date of Birth: 04-Jun-1945

## 2015-04-02 ENCOUNTER — Other Ambulatory Visit: Payer: Self-pay

## 2015-04-02 ENCOUNTER — Ambulatory Visit (INDEPENDENT_AMBULATORY_CARE_PROVIDER_SITE_OTHER): Payer: 59 | Admitting: *Deleted

## 2015-04-02 ENCOUNTER — Ambulatory Visit: Payer: Self-pay

## 2015-04-02 DIAGNOSIS — Q2112 Patent foramen ovale: Secondary | ICD-10-CM

## 2015-04-02 DIAGNOSIS — I4892 Unspecified atrial flutter: Secondary | ICD-10-CM

## 2015-04-02 DIAGNOSIS — I6350 Cerebral infarction due to unspecified occlusion or stenosis of unspecified cerebral artery: Secondary | ICD-10-CM

## 2015-04-02 DIAGNOSIS — Q211 Atrial septal defect: Secondary | ICD-10-CM

## 2015-04-02 DIAGNOSIS — Z5181 Encounter for therapeutic drug level monitoring: Secondary | ICD-10-CM

## 2015-04-02 DIAGNOSIS — I480 Paroxysmal atrial fibrillation: Secondary | ICD-10-CM | POA: Diagnosis not present

## 2015-04-02 LAB — POCT INR: INR: 4.7

## 2015-04-02 NOTE — Patient Outreach (Signed)
[facility]) Care Management  04/02/2015  Kendra [name] Feb 03, 1946 [id number]   Inbound call received from caregiver, husband, [name]; states patient has another appt this morning and request to change appt time to later day.   RN CM rescheduled appt for later day.    [name], RN, BSN, [facility], CCM  [employer] Management Coordinator [phone] Direct [phone] Cell [phone] Office [phone] Fax

## 2015-04-02 NOTE — Patient Outreach (Signed)
[facility]) Care Management  04/02/2015  Kendra [name] [birth year] [id number]  [facility]roke Program Admission: 02/16/2015 - 02/19/2015 ([facility])  Providers: New local Primary MD: Dr. Derrel Nip - 1st appt scheduled for 04/27/2015.  Previous Primary MD: Dr. Cecille Amsterdam. [name], [address] [place], [place] [address] - [phone] Neurologist: Dr. Leonie Man - 1st appt: 05/12/14 (Patient on wait list with Dr. Leonie Man for earlier appt if opportunity presents itself).  Radiologist: last appt 03/19/14  Cardiologist: Dr. Burt Knack - last appt 03/09/14 Oncologist: Dr. 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  Mobility: Ambulating with no assistive devices post Stroke. Safety: Husband is still working and is concerned with leaving patient alone at home at all. Husband has been provided [facility] SW services to provide information on safety alert system options. Husband states that [facility] has also provided him with information but he is still reviewing the information and has not made a definite decision yet.  RN CM discussed first responders are usually with local fire department; encouraged to discuss concerns of establishing system with fire department for house entry if husband is not home at the time of need to establish plan.  Falls: None  Pain: None  Transportation: Husband. Husband ask "When will Kendra [name] be able to drive again?" RN CM has open discussion relating to patients speech could give perception of being impaired and reactions to quick stops, red lights, and reaction time can create a safety concern. Advised to discuss with all MD's on follow-up care and not to allow patient to drive until Dr. Leonie Man has released  patient to drive.  Caregiver: Husband, Kendra [name] Advanced Directives: No. Husband elects NOT to have Advanced Directive due to his past experience as an attorney with the document being misunderstood. Husband and patient are well educated to the pros and cons of having this document and have made decision not to complete.  DME: Home BP cuff  THN conditions:  Admissions: 1 02/16/2015 - 02/19/2015 (CVA) ER visits: 1 Husband states no new issues over the past week. Patient is walking with PT and is coping better with her aphasia. ST sessions continued and currently ordered by Dr. Burt Knack. Completed OT/PT services. Patient able to walk a couple of miles. H/o TRANSESOPHAGEAL ECHOCARDIOGRAM (TEE) completed 03/08/2015 confirming "clotting in the heart."  MD has ordered transition from Eliquis to Coumadin. H/o patient bridged with Lovenox 4-5 days to bridge patient over to Coumadin. Plan: TRANSESOPHAGEAL ECHOCARDIOGRAM (TEE) once patient has been on Coumadin for 6 week treatment and labs are within range. [facility] [employer] mailed 03/05/2015 (reviewed 03/11/2015).  -Recovering After Stroke -What You Can Do To Prevent A Second Stroke Medications: ASA 81 mg tablet everyday added to medication regime. Patient started 1st dose today (03/05/2015).  Coumadin regime continues:  Goal is to get patient to therapeutic level on coumadin for 6 weeks to prepare for TEE procedure.  Patient getting weekly (Friday) INRs.  Patient taking Coumadin dosages as ordered based on INR levels. Husband states patient still has a knot at site of Lovenox injections.  RN CM advised to show MD on next appt.  Advised to place a warm cloth to area to facilitate absorption of what is most likely a small local hematoma reaction to injections.   States  patient had a nose bleed but stopped fairly quickly. States patient has been sitting by the fire and this has dried her nose.   RN CM provided self management tips  such as adding moisture to the air by adding crock pot with water in the kitchen.   RN CM encouraged to avoid use of stove due to quick evaporation and safety concerns with fire.   Plan Referral Date: 02/25/2015 [facility] services 02/16/2015 H/o patient is NOT eligible for [facility] services for ACO services. RN CM will continue to follow for [facility]roke Program services due to caregiver continues to have questions and patient is not have skilled nursing need to qualify for [facility] services.    Week 1 02/26/2015 Screening and [facility] Stroke Week 2 03/05/2015 Initial Assessment  Week 3 03/11/2015 Telephone Assessment Week 4 03/25/2015 Telephone Assessment  Week 5 04/02/2015 Telephone Assessment   Safety RN CM provided education on home safety alert systems.  RN CM encouraged to contact local fire department to discuss any experiences with systems in the community to help Caregiver make educated selection out of resource list provided.   RN CM also discussed option of home camera to provide additional safety for patient while home alone and create another way to monitor patient.   RN CM advised to please notify MD of any changes in condition prior to scheduled appt's.  RN CM provided contact name and # [phone] or main office # [phone] and 24-hour nurse line # [phone].  RN CM confirmed patient is aware of 911 services for urgent emergency needs.  Kendra Laster, RN, BSN, [facility], CCM  [employer] [employer] Management Coordinator [phone] Direct [phone] Cell [phone] Office [phone] Fax

## 2015-04-06 ENCOUNTER — Ambulatory Visit: Payer: 59 | Admitting: Speech Pathology

## 2015-04-06 DIAGNOSIS — R4701 Aphasia: Secondary | ICD-10-CM | POA: Diagnosis not present

## 2015-04-06 DIAGNOSIS — R482 Apraxia: Secondary | ICD-10-CM

## 2015-04-06 NOTE — Therapy (Signed)
[facility] [address] [place], [place], [address] Phone: [phone]   Fax:  [phone]  Speech Language Pathology Treatment  Patient Details  Name: Kendra [name] MRN: [id number] Date of Birth: 1945/05/22 Referring Provider: Sherren Mocha , M.D.  Encounter Date: 04/06/2015      End of Session - 04/06/15 1103    Visit Number 6   Number of Visits 17   Date for SLP Re-Evaluation 05/14/15   Authorization Type BC[employer] 20 visit limit ST   Authorization - Visit Number 5   Authorization - Number of Visits 20   SLP Start Time 1017   SLP Stop Time  [id number]   SLP Time Calculation (min) 46 min      Past Medical History  Diagnosis Date  . 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 Atrial flutter ([place])   . Atrial fibrillation (HCC)     chronic anticoag  . Arthus phenomenon   . Hypercholesterolemia     Pure  . GERD (gastroesophageal reflux disease)   . Kidney infarction [facility])   . TIA (transient ischemic attack)     as per 07/30/00 note from [place]   . Hx of blood clots   . Hemorrhoids     Past Surgical History  Procedure Laterality Date  . Ablasion    . Cesarean section    . Breast biopsy Right   . Appendectomy    . Fibroid tumor removal  1996  . Cardiac electrophysiology study and ablation    . Radiology with anesthesia N/A 02/16/2015    Procedure: RADIOLOGY WITH ANESTHESIA;  Surgeon: Medication Radiologist, MD;  Location: [facility];  Service: Radiology;  Laterality: N/A;  . Tee without cardioversion N/A 03/08/2015    Procedure: TRANSESOPHAGEAL ECHOCARDIOGRAM (TEE);  Surgeon: [name], MD;  Location: [facility];  Service: Cardiovascular;  Laterality: N/A;    There were no vitals filed for this visit.  Visit Diagnosis: Expressive aphasia  Verbal apraxia      Subjective Assessment - 04/06/15 1022    Subjective "We worked on naming 3 things that go with an item he gave me"   Currently in Pain? No/denies               ADULT SLP TREATMENT - 04/06/15 1023    General Information   Behavior/Cognition Alert;Cooperative;Pleasant mood   Treatment Provided   Treatment provided Cognitive-Linquistic   Cognitive-Linquistic Treatment   Treatment focused on Aphasia   Skilled Treatment Facilitated verbal expression having pt describe objects for ST to guess with occasional questioning for more information to clarify. Word finding diffiiculties in conversation require cues to use compensations. Facilitated verbal expression generating 2 sentences for multiple meaning words - pt frequently required semantic cues for 2nd meaning of mildly complex words.    Assessment / Recommendations / Plan   Plan Continue with current plan of care   Progression Toward Goals   Progression toward goals Progressing toward goals            SLP Short Term Goals - 04/06/15 1102    SLP SHORT TERM GOAL #1   Title pt will write short notes with independence, given self-correction   Time 2   Period Weeks   Status On-going   SLP SHORT TERM GOAL #2   Title pt will engage in mod complex description tasks with 90% success and use of strategies   Time 2   Period Weeks   Status On-going   SLP  SHORT TERM GOAL #3   Title pt to demo simple conversation with use of compensations with rare min A   Time 2   Period Weeks   Status On-going          SLP Long Term Goals - 04/06/15 1102    SLP LONG TERM GOAL #1   Title pt to write short paragraphs (5-7 sentences) and ID errors independently   Time 6   Period Weeks   Status On-going   SLP LONG TERM GOAL #2   Title pt will engage in 10 minutes mod complex conversation using compensations, with rare min A   Time 6   Period Weeks   Status On-going   SLP LONG TERM GOAL #3   Title pt will engage in 5 minutes complex conversation using compensations, with occasional min A   Time 6   Period Weeks   Status On-going          Plan - 04/06/15 1059    Clinical  Impression Statement Continue skilled ST to maximize verbal expression and carryover of compnesatoins for aphasia for improved independence.    Speech Therapy Frequency 2x / week   Treatment/Interventions Language facilitation;Cueing hierarchy;Multimodal communcation approach;Functional tasks;Patient/family education;Compensatory strategies;SLP instruction and feedback;Internal/external aids   Potential to Achieve Goals Good        Problem List Patient Active Problem List   Diagnosis Date Noted  . Encounter for therapeutic drug monitoring 03/15/2015  . PFO (patent foramen ovale) 02/19/2015  . Acute respiratory failure with hypoxia ([place]) 02/16/2015  . Essential hypertension 02/16/2015  . Acute encephalopathy 02/16/2015  . 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 Insomnia 01/26/2011  . GERD 02/16/2009  . TRANSIENT ISCHEMIC ATTACKS, HX OF 02/16/2009  . CHICKENPOX, HX OF 02/16/2009  . Essential hypertension, benign 12/04/2008  . Atrial fibrillation ([place]) 11/18/2008  . ATRIAL FLUTTER 11/18/2008  . PURE HYPERCHOLESTEROLEMIA 07/20/2008  . ARTHUS PHENOMENON 07/20/2008    Lovvorn, Annye Rusk MS, CCC-SLP 04/06/2015, 11:03 AM  [facility] [address] [place], [place], [address] Phone: [phone]   Fax:  [phone]   Name: Kendra [name] MRN: [id number] Date of Birth: 08/24/1945

## 2015-04-08 ENCOUNTER — Ambulatory Visit: Payer: 59

## 2015-04-08 DIAGNOSIS — R4701 Aphasia: Secondary | ICD-10-CM | POA: Diagnosis not present

## 2015-04-08 DIAGNOSIS — R482 Apraxia: Secondary | ICD-10-CM

## 2015-04-08 NOTE — Therapy (Signed)
[facility] [address] [place], [place], [address] Phone: [phone]   Fax:  [phone]  Speech Language Pathology Treatment  Patient Details  Name: Kendra [name] MRN: [id number] Date of Birth: [birth year] Referring Provider: Sherren Mocha , M.D.  Encounter Date: 04/08/2015      End of Session - 04/08/15 1151    Visit Number 7   Number of Visits 17   Date for SLP Re-Evaluation 05/14/15   Authorization Type BCBS 20 visit limit ST   Authorization - Visit Number 6   Authorization - Number of Visits 20   SLP Start Time [id number]   SLP Stop Time  1017   SLP Time Calculation (min) 42 min   Activity Tolerance Patient tolerated treatment well      Past Medical History  Diagnosis Date  . 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 Atrial flutter ([place])   . Atrial fibrillation (HCC)     chronic anticoag  . Arthus phenomenon   . Hypercholesterolemia     Pure  . GERD (gastroesophageal reflux disease)   . Kidney infarction [facility])   . TIA (transient ischemic attack)     as per 07/30/00 note from [facility]   . Hx of blood clots   . Hemorrhoids     Past Surgical History  Procedure Laterality Date  . Ablasion    . Cesarean section    . Breast biopsy Right   . Appendectomy    . Fibroid tumor removal  1996  . Cardiac electrophysiology study and ablation    . Radiology with anesthesia N/A 02/16/2015    Procedure: RADIOLOGY WITH ANESTHESIA;  Surgeon: Medication Radiologist, MD;  Location: [facility];  Service: Radiology;  Laterality: N/A;  . Tee without cardioversion N/A 03/08/2015    Procedure: TRANSESOPHAGEAL ECHOCARDIOGRAM (TEE);  Surgeon: [name], MD;  Location: [facility];  Service: Cardiovascular;  Laterality: N/A;    There were no vitals filed for this visit.  Visit Diagnosis: Expressive aphasia  Verbal apraxia      Subjective Assessment - 04/08/15 0941    Subjective Pt reports she has had social situations  since last ST visit that have been acceptable for her speech/language.   Patient is accompained by: Family member  husband   Currently in Pain? No/denies               ADULT SLP TREATMENT - 04/08/15 0945    General Information   Behavior/Cognition Alert;Cooperative;Pleasant mood   Treatment Provided   Treatment provided Cognitive-Linquistic   Cognitive-Linquistic Treatment   Treatment focused on Aphasia   Skilled Treatment SLP facilitated pt's use of compensatory strategies (reduced rate) in picture description with occasional min cues faded to rare min cues. Educated husband on ways to facilitate word finding. SLP reiterated to pt her need to reduce speech rate. Anomia during sentence/2 sentence responses today assisted by semantic cueing.    Assessment / Recommendations / Plan   Plan Continue with current plan of care   Progression Toward Goals   Progression toward goals Progressing toward goals          SLP Education - 04/08/15 1151    Education provided Yes   Education Details assistance with anomia at home, reduced speech rate   Person(s) Educated Patient;Spouse   Methods Demonstration;Explanation;Verbal cues   Comprehension Verbalized understanding;Returned demonstration;Verbal cues required          SLP Short Term Goals - 04/08/15 1153    SLP  SHORT TERM GOAL #1   Title pt will write short notes with independence, given self-correction   Time 2   Period Weeks   Status On-going   SLP SHORT TERM GOAL #2   Title pt will engage in mod complex description tasks with 90% success and use of strategies   Status Achieved   SLP SHORT TERM GOAL #3   Title pt to demo simple conversation with use of compensations with rare min A   Time 2   Period Weeks   Status On-going          SLP Long Term Goals - 04/08/15 1154    SLP LONG TERM GOAL #1   Title pt to write short paragraphs (5-7 sentences) and ID errors independently   Time 6   Period Weeks   Status  On-going   SLP LONG TERM GOAL #2   Title pt will engage in 10 minutes mod complex conversation using compensations, with rare min A   Time 6   Period Weeks   Status On-going   SLP LONG TERM GOAL #3   Title pt will engage in 5 minutes complex conversation using compensations, with occasional min A   Time 6   Period Weeks   Status On-going          Plan - 04/08/15 1152    Clinical Impression Statement Pt with improved linguistic fluency with slowed rate. Good success with structured tasks in rate reduction. Husband noticed marked improvement with rate reduction. Continue skilled ST to maximize verbal expression and carryover of compnesatoins for aphasia for improved independence.    Speech Therapy Frequency 2x / week   Duration --  6 weeks   Treatment/Interventions Language facilitation;Cueing hierarchy;Multimodal communcation approach;Functional tasks;Patient/family education;Compensatory strategies;SLP instruction and feedback;Internal/external aids   Potential to Achieve Goals Good   Consulted and Agree with Plan of Care Patient        Problem List Patient Active Problem List   Diagnosis Date Noted  . Encounter for therapeutic drug monitoring 03/15/2015  . PFO (patent foramen ovale) 02/19/2015  . Acute respiratory failure with hypoxia ([place]) 02/16/2015  . Essential hypertension 02/16/2015  . Acute encephalopathy 02/16/2015  . 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 Insomnia 01/26/2011  . GERD 02/16/2009  . TRANSIENT ISCHEMIC ATTACKS, HX OF 02/16/2009  . CHICKENPOX, HX OF 02/16/2009  . Essential hypertension, benign 12/04/2008  . Atrial fibrillation ([place]) 11/18/2008  . ATRIAL FLUTTER 11/18/2008  . PURE HYPERCHOLESTEROLEMIA 07/20/2008  . ARTHUS PHENOMENON 07/20/2008    [facility] ,MS, CCC-SLP  04/08/2015, 11:54 AM  [facility] [address] [place], [place], [address] Phone:  [phone]   Fax:  [phone]   Name: Kendra [name] MRN: [id number] Date of Birth: Aug 02, 1945

## 2015-04-09 ENCOUNTER — Ambulatory Visit (INDEPENDENT_AMBULATORY_CARE_PROVIDER_SITE_OTHER): Payer: 59 | Admitting: *Deleted

## 2015-04-09 ENCOUNTER — Ambulatory Visit: Payer: Self-pay

## 2015-04-09 DIAGNOSIS — I480 Paroxysmal atrial fibrillation: Secondary | ICD-10-CM

## 2015-04-09 DIAGNOSIS — Z5181 Encounter for therapeutic drug level monitoring: Secondary | ICD-10-CM | POA: Diagnosis not present

## 2015-04-09 DIAGNOSIS — I4892 Unspecified atrial flutter: Secondary | ICD-10-CM

## 2015-04-09 DIAGNOSIS — Q211 Atrial septal defect: Secondary | ICD-10-CM

## 2015-04-09 DIAGNOSIS — I6350 Cerebral infarction due to unspecified occlusion or stenosis of unspecified cerebral artery: Secondary | ICD-10-CM | POA: Diagnosis not present

## 2015-04-09 DIAGNOSIS — Q2112 Patent foramen ovale: Secondary | ICD-10-CM

## 2015-04-09 DIAGNOSIS — I4891 Unspecified atrial fibrillation: Secondary | ICD-10-CM

## 2015-04-09 LAB — POCT INR: INR: 2.6

## 2015-04-12 ENCOUNTER — Ambulatory Visit (INDEPENDENT_AMBULATORY_CARE_PROVIDER_SITE_OTHER): Payer: 59 | Admitting: Oncology

## 2015-04-12 ENCOUNTER — Ambulatory Visit: Payer: 59

## 2015-04-12 ENCOUNTER — Encounter: Payer: Self-pay | Admitting: Oncology

## 2015-04-12 VITALS — BP 132/52 | HR 72 | Temp 97.9°F | Ht 68.0 in | Wt 155.1 lb

## 2015-04-12 DIAGNOSIS — Q211 Atrial septal defect: Secondary | ICD-10-CM | POA: Diagnosis not present

## 2015-04-12 DIAGNOSIS — R4701 Aphasia: Secondary | ICD-10-CM

## 2015-04-12 DIAGNOSIS — Q2112 Patent foramen ovale: Secondary | ICD-10-CM

## 2015-04-12 DIAGNOSIS — Z8673 Personal history of transient ischemic attack (TIA), and cerebral infarction without residual deficits: Secondary | ICD-10-CM

## 2015-04-12 DIAGNOSIS — Z7901 Long term (current) use of anticoagulants: Secondary | ICD-10-CM

## 2015-04-12 DIAGNOSIS — R482 Apraxia: Secondary | ICD-10-CM

## 2015-04-12 DIAGNOSIS — D66 Hereditary factor VIII deficiency: Secondary | ICD-10-CM | POA: Diagnosis not present

## 2015-04-12 DIAGNOSIS — I6350 Cerebral infarction due to unspecified occlusion or stenosis of unspecified cerebral artery: Secondary | ICD-10-CM

## 2015-04-12 HISTORY — DX: Hereditary factor VIII deficiency: D66

## 2015-04-12 NOTE — Patient Instructions (Signed)
Return as needed

## 2015-04-12 NOTE — Patient Instructions (Signed)
  Please complete the assigned speech therapy homework and return it to your next session. It will be necessary, to make slower rate a habit, to practice it 6/7 days/week.

## 2015-04-12 NOTE — Therapy (Signed)
[facility] [address] [place], [place], [address] Phone: [phone]   Fax:  [phone]  Speech Language Pathology Treatment  Patient Details  Name: Kendra [name] MRN: [id number] Date of Birth: 06-Dec-1945 Referring Provider: Sherren Mocha , M.D.  Encounter Date: 04/12/2015      End of Session - 04/12/15 1121    Visit Number 8   Number of Visits 17   Date for SLP Re-Evaluation 05/14/15   Authorization Type BCBS 20 visit limit ST   Authorization - Visit Number 7   Authorization - Number of Visits 20   SLP Start Time [id number]   SLP Stop Time  [id number]   SLP Time Calculation (min) 42 min      Past Medical History  Diagnosis Date  . 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 Atrial flutter ([place])   . Atrial fibrillation (HCC)     chronic anticoag  . Arthus phenomenon   . Hypercholesterolemia     Pure  . GERD (gastroesophageal reflux disease)   . Kidney infarction [facility])   . TIA (transient ischemic attack)     as per 07/30/00 note from [facility]   . Hx of blood clots   . Hemorrhoids     Past Surgical History  Procedure Laterality Date  . Ablasion    . Cesarean section    . Breast biopsy Right   . Appendectomy    . Fibroid tumor removal  1996  . Cardiac electrophysiology study and ablation    . Radiology with anesthesia N/A 02/16/2015    Procedure: RADIOLOGY WITH ANESTHESIA;  Surgeon: Medication Radiologist, MD;  Location: [facility];  Service: Radiology;  Laterality: N/A;  . Tee without cardioversion N/A 03/08/2015    Procedure: TRANSESOPHAGEAL ECHOCARDIOGRAM (TEE);  Surgeon: [name], MD;  Location: [facility];  Service: Cardiovascular;  Laterality: N/A;    There were no vitals filed for this visit.  Visit Diagnosis: Expressive aphasia  Verbal apraxia      Subjective Assessment - 04/12/15 0936    Subjective Pt reported that she had speaking situations over the weekend that went very well.   Patient is  accompained by: Family member  husband               ADULT SLP TREATMENT - 04/12/15 0937    General Information   Behavior/Cognition Alert;Cooperative;Pleasant mood   Treatment Provided   Treatment provided Cognitive-Linquistic   Cognitive-Linquistic Treatment   Treatment focused on Aphasia   Skilled Treatment Pt's husband reported pt is doing better with using linguistic compensations than in the past. Husband agreed pt is good in social situations. Still notices anomia in conversation with him - pt states she has returned to baseline but husband disagrees. SLP educated pt/husband on paring down ST/discharge criteria. SLP worked on high level linguistic tasks with pt today and req'd min A rarely for rate reduction. Linguistically pt req'd cues from SLP for anomia in structured tasks. In conversation and less-structured tasks, pt maintained slow rate with rare min A/SBA. Told husband and pt that pt may reduce to x1/week next week.   Assessment / Recommendations / Plan   Plan Continue with current plan of care   Progression Toward Goals   Progression toward goals Progressing toward goals            SLP Short Term Goals - 04/12/15 1126    SLP SHORT TERM GOAL #1   Title pt will write short notes with  independence, given self-correction   Time 1   Period Weeks   Status On-going   SLP SHORT TERM GOAL #2   Title pt will engage in mod complex description tasks with 90% success and use of strategies   Status Achieved   SLP SHORT TERM GOAL #3   Title pt to demo simple conversation with use of compensations with rare min A   Status Achieved          SLP Long Term Goals - 04/12/15 1127    SLP LONG TERM GOAL #1   Title pt to write short paragraphs (5-7 sentences) and ID errors independently   Time 6   Period Weeks   Status On-going   SLP LONG TERM GOAL #2   Title pt will engage in 10 minutes mod complex conversation using compensations, with rare min A   Time 6   Period  Weeks   Status On-going   SLP LONG TERM GOAL #3   Title pt will engage in 5 minutes complex conversation using compensations, with occasional min A   Time 6   Period Weeks   Status On-going          Plan - 04/12/15 1122    Clinical Impression Statement Pt with improved linguistic fluency continued today.  Continue skilled ST to maximize verbal expression, and carryover of compnesations for aphasia for improved independence. Pt with good to excellent success in semi-structured tasks and good success in conversation - pt told husband and pt she may reduce to once/week next week.   Speech Therapy Frequency 2x / week   Duration --  5 weeks   Treatment/Interventions Language facilitation;Cueing hierarchy;Multimodal communcation approach;Functional tasks;Patient/family education;Compensatory strategies;SLP instruction and feedback;Internal/external aids   Consulted and Agree with Plan of Care Patient        Problem List Patient Active Problem List   Diagnosis Date Noted  . Encounter for therapeutic drug monitoring 03/15/2015  . PFO (patent foramen ovale) 02/19/2015  . Acute respiratory failure with hypoxia ([place]) 02/16/2015  . Essential hypertension 02/16/2015  . Acute encephalopathy 02/16/2015  . 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 Insomnia 01/26/2011  . GERD 02/16/2009  . TRANSIENT ISCHEMIC ATTACKS, HX OF 02/16/2009  . CHICKENPOX, HX OF 02/16/2009  . Essential hypertension, benign 12/04/2008  . Atrial fibrillation ([place]) 11/18/2008  . ATRIAL FLUTTER 11/18/2008  . PURE HYPERCHOLESTEROLEMIA 07/20/2008  . ARTHUS PHENOMENON 07/20/2008    [facility] ,MS, CCC-SLP  04/12/2015, 11:27 AM  [facility] [address] [place], [place], [address] Phone: [phone]   Fax:  [phone]   Name: Kendra [name] MRN: [id number] Date of Birth: 08-06-1945

## 2015-04-12 NOTE — Progress Notes (Signed)
Patient ID: Kendra [name], female   DOB: 13-Jun-1945, 70 y.o.   MRN: [id number] Hematology and Oncology Follow Up Visit  MACAILA DILORENZO [id number] 12/26/1945 70 y.o. 04/12/2015 6:34 PM   Principle Diagnosis: Encounter Diagnoses  Name Primary?  . 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 PFO (patent foramen ovale)   . 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 I saw her 7 years ago for an initial hospital consultation on 04/01/2008 with a subsequent office follow-up on 05/13/2008. At the time I first saw her she was already 15 years status post left renal infarction felt related to newly diagnosed atrial fibrillation/flutter and was on full dose warfarin anticoagulation. Medical therapy was unsuccessful in controlling her arrhythmias. Cardioversion done November 2001 with only temporary control. Initial radiofrequency ablation procedure done in [place] which was not successful then a second procedure done at [facility] by a Dr. Rolland Porter in December 2001. There is a history of a TIA in June 2002. At the time I saw her in February 2010, she was admitted to the hospital with acute onset of lower abdominal pain. She was found to have multiple infarcts in her left kidney. A single infarct in the upper pole of the right kidney. Aorta and renal arteries well visualized and appeared normal. EKG showed sinus rhythm. Echocardiogram with mild diastolic dysfunction. No obvious clots. TEE not done at that time. There was a history of a small PFO. Discussion about potential closure. I did feel that paradoxical emboli could have explained her findings at that time. Hypercoagulation evaluation was unremarkable except for a elevated clotting factor VIII of 225% (76-1 26). She tested negative for all the usual acquired an inherited coagulopathies including factor V Leiden gene mutation, her thrombin gene mutation, anticardiolipin antibodies,  anti-beta 2 glycoprotein 1 antibodies, lupus anticoagulant, and a plasma homocystine was normal. At time of admission she was slightly subtherapeutic on her Coumadin with an INR of 1.9. I recommended increasing her target INR of 2.5-3.5 so that she would be in the therapeutic range more reliably. 81 mg of aspirin added. A repeat factor VIII level remained significantly elevated at time of her office follow-up visit at 276% of control. I recommended that her family be tested. So far 2 of her daughters have been tested. One daughter now age 38 has elevated factor VIII but has not had any blood clots. Another daughter age 70 does not have an elevated factor VIII. Her 2 sons currently age 32 and 44 have not been tested. Her mother died at age 81 of postpartum complications with suspicion that she might have had pulmonary emboli. She had 2 brothers and 2 sisters nobody else had clotting problems two of her brothers are now deceased.  She is continued to have problems with atrial arrhythmias over the last 7 years. She was maintained on low-dose aspirin and Coumadin until about 5 years ago when Pradaxa became available.  Recent history: She fell down a flight of stairs on 02/13/2015. She did not strike her head. She presented 3 days later with left hemiplegia and disconjugate gaze. She had an urgent evaluation. She was found to have embolic occlusion at the terminal portion of the left internal carotid artery, the left middle cerebral artery, and the proximal left anterior cerebral artery. She underwent emergency endovascular revascularization and thrombectomy with 100% efficacy and restoration of flow. I was quite surprised when I saw her  today given this history. She has no residual focal neurologic deficits except for minimal intermittent apraxia.  A transesophageal echocardiogram done 03/08/2015 showed a 2.1 x 1.2 cm thrombus in a large left atrial appendage felt to be the focal point of her cerebral  embolism. Also noted was a septum secundum atrial septal defect 5 x 2 mm with left to right blood flow.  Of note, she was on twice daily Pradaxa, dose not specified in the history and physical exam but I assume full dose since she has normal renal function. No comment on whether she was also on aspirin. Admission EKG showed sinus rhythm. Neurologist initially recommended changing from Pradaxa to Eloquis. However, one of her cardiologists discussed the case with me while she was still in the hospital. I felt that resuming warfarin would give her better protection than changing her to a Xa inhibitor. I also supported continuing low-dose aspirin. She was transitioned back to warfarin at time of 03/10/2015 cardiology follow-up visit.   Medications: reviewed  Allergies:  Allergies  Allergen Reactions  . Amiodarone Hcl Nausea Only and Rash    Review of Systems: Hematology:  No clinical bleeding ENT ROS: Dry mouth Breast ROS:  Respiratory ROS: No dyspnea Cardiovascular ROS: No chest pain. No palpitations.  Gastrointestinal ROS:  No abdominal pain or change in bowel habit  Genito-Urinary ROS:  Musculoskeletal ROS:  Neurological ROS: No headache or change in vision. No dysarthria, no focal weakness, no paresthesias Dermatological ROS:  Remaining ROS negative:   Physical Exam: Blood pressure 132/52, pulse 72, temperature 97.9 F (36.6 C), temperature source Oral, height 5\' 8"  (1.727 m), weight 155 lb 1.6 oz (70.353 kg), SpO2 100 %. Wt Readings from Last 3 Encounters:  04/12/15 155 lb 1.6 oz (70.353 kg)  03/10/15 150 lb (68.04 kg)  03/05/15 150 lb (68.04 kg)     General appearance: Thin Caucasian woman HENNT: Pharynx no erythema, exudate, mass, or ulcer. No thyromegaly or thyroid nodules Lymph nodes: No cervical, supraclavicular, or axillary lymphadenopathy Breasts:  Lungs: Clear to auscultation, resonant to percussion throughout Heart: Regular rhythm, no murmur, no gallop, no rub, no  click, no edema Abdomen: Soft, nontender, normal bowel sounds, no mass, no organomegaly Extremities: No edema, no calf tenderness Musculoskeletal: no joint deformities GU:  Vascular: Carotid pulses 2+, no bruits, distal pulses: Dorsalis pedis 1+ symmetric Neurologic: Alert, oriented, PERRLA, optic discs sharp and vessels normal, no hemorrhage or exudate, cranial nerves grossly normal, motor strength 5 over 5, reflexes 1+ symmetric, upper body coordination normal, finger to hand, rapid alternating movements, gait normal, Skin: No rash or ecchymosis  Lab Results: CBC W/Diff    Component Value Date/Time   WBC 8.1 02/18/2015 0227   RBC 3.84* 02/18/2015 0227   HGB 12.2 02/18/2015 0227   HCT 33.4* 02/18/2015 0227   PLT 186 02/18/2015 0227   MCV 87.0 02/18/2015 0227   MCH 31.8 02/18/2015 0227   MCHC 36.5* 02/18/2015 0227   RDW 11.8 02/18/2015 0227   LYMPHSABS 1.0 02/17/2015 0615   MONOABS 0.7 02/17/2015 0615   EOSABS 0.1 02/17/2015 0615   BASOSABS 0.0 02/17/2015 0615     Chemistry      Component Value Date/Time   NA 132* 02/18/2015 0227   K 3.8 02/18/2015 0227   CL 104 02/18/2015 0227   CO2 19* 02/18/2015 0227   BUN 6 02/18/2015 0227   CREATININE 0.62 02/18/2015 0227      Component Value Date/Time   CALCIUM 7.9* 02/18/2015 0227  ALKPHOS 83 02/16/2015 0830   AST 21 02/16/2015 0830   ALT 17 02/16/2015 0830   BILITOT 0.9 02/16/2015 0830       Radiological Studies: See discussion above    Impression:  Although she has an underlying risk factor that promotes increased clotting, elevation of clotting factor VIII, the main risk factor for ongoing thromboembolic events in this woman is chronic, paroxysmal, atrial fibrillation/flutter with an enlarged, floppy, left atrial appendage as demonstrated by recent large embolus in the left atrial appendage seen on TEE.  A major cerebral embolus was not prevented by full dose dabigitran. I do not have any confidence that a Xa inhibitor  will do any better. I feel that she is best served by Coumadin with a target INR of 2.5-3.5 to maximize the chance that she will always be in the therapeutic range. There is a tendency to micromanage  Coumadin dosing. This lady cannot afford to be subtherapeutic. We know that even in large, controlled, clinical trials, with a target INR range of 2-3 we are only able to keep people in the therapeutic range about 60% of the time.  Approximately one hour spent with direct face-to-face contact with the patient and her husband including physical examination and counseling not including time for data review and dictation.   CC: Patient Care Team: Crecencio Mc, MD as PCP - General (Internal Medicine) Greg Cutter, LCSW as [place] Management (Licensed Clinical Social Worker) Standley Brooking, RN as [place] Management Pramod Clydene Fake, MD as Consulting Physician (Neurology)   Annia Belt, MD 2/13/20176:34 PM

## 2015-04-14 ENCOUNTER — Ambulatory Visit: Payer: 59

## 2015-04-14 DIAGNOSIS — R4701 Aphasia: Secondary | ICD-10-CM | POA: Diagnosis not present

## 2015-04-14 DIAGNOSIS — R482 Apraxia: Secondary | ICD-10-CM

## 2015-04-14 NOTE — Therapy (Signed)
[facility] [address] [place], [place], [address] Phone: [phone]   Fax:  [phone]  Speech Language Pathology Treatment  Patient Details  Name: Kendra [name] MRN: [id number] Date of Birth: [birth year] Referring Provider: Sherren Mocha , M.D.  Encounter Date: 04/14/2015      End of Session - 04/14/15 1150    Visit Number 9   Number of Visits 17   Date for SLP Re-Evaluation 05/14/15   Authorization - Visit Number 9   Authorization - Number of Visits 20   SLP Start Time 1103   SLP Stop Time  1146   SLP Time Calculation (min) 43 min   Activity Tolerance Patient tolerated treatment well      Past Medical History  Diagnosis Date  . Factor VIII deficiency ([place])   . Atrial flutter ([place])   . Atrial fibrillation ([facility])     chronic anticoag  . Arthus phenomenon   . Hypercholesterolemia     Pure  . GERD (gastroesophageal reflux disease)   . Kidney infarction [facility])   . TIA (transient ischemic attack)     as per 07/30/00 note from [facility]   . Hx of blood clots   . Hemorrhoids   . 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 Ablasion    . Cesarean section    . Breast biopsy Right   . Appendectomy    . Fibroid tumor removal  1996  . Cardiac electrophysiology study and ablation    . Radiology with anesthesia N/A 02/16/2015    Procedure: RADIOLOGY WITH ANESTHESIA;  Surgeon: Medication Radiologist, MD;  Location: [facility];  Service: Radiology;  Laterality: N/A;  . Tee without cardioversion N/A 03/08/2015    Procedure: TRANSESOPHAGEAL ECHOCARDIOGRAM (TEE);  Surgeon: [name], MD;  Location: [facility];  Service: Cardiovascular;  Laterality: N/A;    There were no vitals filed for this visit.  Visit Diagnosis: Expressive aphasia  Verbal apraxia      Subjective Assessment - 04/14/15 1106    Subjective Pt has continued to perform  homework with husband.   Currently in Pain? No/denies               ADULT SLP TREATMENT - 04/14/15 1108    General Information   Behavior/Cognition Alert;Cooperative;Pleasant mood   Treatment Provided   Treatment provided Cognitive-Linquistic   Cognitive-Linquistic Treatment   Treatment focused on Aphasia   Skilled Treatment SLP targeted specific language to decr pt anomia/dysnomia, incr error awareness, and pt using compensations by having her describe two possible occurrences following a picture being taken. Additionally, SLP targeted specific language to decr pt anomia and to incr awareness of errors (70% success) . Pt req'd mod-max SLP cues occasionally in this more complex task of looking at a situation through different peoples' eyes. Suggested to pt to work crossword puzzles and 20 items in linguistic relationships with certain, specific nouns (husband generating noun list)   Assessment / Recommendations / Plan   Plan Continue with current plan of care   Progression Toward Goals   Progression toward goals Progressing toward goals            SLP Short Term Goals - 04/14/15 1204    SLP SHORT TERM GOAL #1   Title pt will write short notes with independence, given self-correction   Status Partially Met   SLP SHORT TERM GOAL #2   Title pt will  engage in mod complex description tasks with 90% success and use of strategies   Status Achieved   SLP SHORT TERM GOAL #3   Title pt to demo simple conversation with use of compensations with rare min A   Status Achieved          SLP Long Term Goals - 04/14/15 1206    SLP LONG TERM GOAL #1   Title pt to write short paragraphs (5-7 sentences) and ID errors independently   Time 5   Period Weeks   Status On-going   SLP LONG TERM GOAL #2   Title pt will engage in 10 minutes mod complex conversation using compensations, with rare min A   Time 5   Period Weeks   Status On-going   SLP LONG TERM GOAL #3   Title pt will engage  in 5 minutes complex conversation using compensations, with occasional min A   Time 5   Period Weeks   Status On-going          Plan - 04/14/15 1153    Clinical Impression Statement Pt with improved linguistic fluency continued today.  Continue skilled ST to maximize verbal expression, and carryover of compnesations for aphasia for improved independence. Pt with good to excellent success in semi-structured tasks and good success in conversation - pt told husband and pt she may reduce to once/week next week.   Speech Therapy Frequency 1x /week   Duration --  5 weeks   Treatment/Interventions Language facilitation;Cueing hierarchy;Multimodal communcation approach;Functional tasks;Patient/family education;Compensatory strategies;SLP instruction and feedback;Internal/external aids   Potential Considerations Severity of impairments   Consulted and Agree with Plan of Care Patient        Problem List Patient Active Problem List   Diagnosis Date Noted  . 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 Encounter for therapeutic drug monitoring 03/15/2015  . PFO (patent foramen ovale) 02/19/2015  . Acute respiratory failure with hypoxia ([place]) 02/16/2015  . Essential hypertension 02/16/2015  . Acute encephalopathy 02/16/2015  . 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 Insomnia 01/26/2011  . GERD 02/16/2009  . TRANSIENT ISCHEMIC ATTACKS, HX OF 02/16/2009  . CHICKENPOX, HX OF 02/16/2009  . Essential hypertension, benign 12/04/2008  . Atrial fibrillation ([place]) 11/18/2008  . ATRIAL FLUTTER 11/18/2008  . PURE HYPERCHOLESTEROLEMIA 07/20/2008  . ARTHUS PHENOMENON 07/20/2008    [facility] ,MS, CCC-SLP  04/14/2015, 12:07 PM  [facility] [address] [place], [place], [address] Phone: [phone]   Fax:  [phone]   Name: Kendra [name] MRN: [id number] Date of Birth: 01/16/1946

## 2015-04-15 ENCOUNTER — Other Ambulatory Visit: Payer: Self-pay

## 2015-04-15 NOTE — Patient Outreach (Addendum)
[name]) Care Management  04/15/2015  Kendra [name] [birth year] [id number]  [place] Stroke Program Admission: 02/16/2015 - 02/19/2015 (CVA) Outbound call to patient to patient and caregiver/husband/Mr. [name] for [facility] Stroke Program follow-up  Providers: New local Primary MD: Dr. Derrel Nip - 1st appt scheduled for 04/27/2015.  H/o Previous Primary MD: Dr. Cecille Amsterdam. [facility], [address] [place], [place] [address] - [phone]  Neurologist: Dr. Leonie Man - 1st appt: 05/12/14 (Patient on wait list with Dr. Leonie Man for earlier appt if opportunity presents itself).  Husband states Dr. Clydene Fake office has not contacted them regarding any opportunities for earlier appt.  States interest in still getting into see Dr. Leonie Man earlier than 05/12/14 if possible.  States patient is interested in driving again but understands the importance of seeing Dr. Leonie Man and having this discussion with MD first.   RN CM advised will contact Dr. Leonie Man with 2nd request for earlier appt if appt option becomes available.   RN CM notified Dr. Leonie Man via Epic in-basket of this request.   Radiologist: last appt 03/19/14  Cardiologist: Dr. Burt Knack - last appt 03/09/14 Oncologist: Dr. Beryle Beams - Last appt  04/12/15 (Factor VIII evaluation)  HH: [facility] (PT, OT, ST) - remains active with ST services.  Insurance: [employer] (Agricultural engineer -patient's husband still working)  Social: Married and lives in her home with husband, [name].  Mobility: Ambulating with no assistive devices post Stroke. Safety: Husband is still working and is concerned with leaving patient alone at home at all. Husband has been provided [facility] SW services to provide information on safety alert system options. Husband states that [name] has also provided him with information but he is still reviewing the information and has not made a definite decision yet.  RN  CM discussed first responders are usually with local fire department; encouraged to discuss concerns of establishing system with fire department for house entry if husband is not home at the time of need to establish plan.  Falls: None  Pain: None  Transportation: Husband. Husband ask "When will [name] be able to drive again?" RN CM has open discussion relating to patients speech could give perception of being impaired and reactions to quick stops, red lights, and reaction time can create a safety concern. Advised to discuss with all MD's on follow-up care and not to allow patient to drive until Dr. Leonie Man has released patient to drive.  Caregiver: Husband, [name] Advanced Directives: No. Husband elects NOT to have Advanced Directive due to his past experience as an attorney with the document being misunderstood. Husband and patient are well educated to the pros and cons of having this document and have made decision not to complete.  DME: Home BP cuff  THN conditions:  Admissions: 1 02/16/2015 - 02/19/2015 (CVA) ER visits: 1 ST sessions continued and currently ordered by Dr. Burt Knack.  OT/PT services completed. . H/o TRANSESOPHAGEAL ECHOCARDIOGRAM (TEE) completed 03/08/2015 confirming "clotting in the heart."  Plan: TRANSESOPHAGEAL ECHOCARDIOGRAM (TEE) once patient has been on Coumadin for 6 week treatment and / or  labs are within range 2.5 for 6 consecutive results.  Patient has had 4 thus far.  Weekly INRs. Patient completed appt with Dr. Beryle Beams for (Factor VIII evaluation).  [facility] Educational Materials mailed 03/05/2015 (reviewed 03/11/2015).  -Recovering After Stroke -What You Can Do To Prevent A Second Stroke  Plan Referral Date: 02/25/2015 [facility]roke Program services 02/16/2015 H/o patient is NOT eligible for [facility]  services for ACO services. RN CM will continue to follow for [facility] Stroke Program services due to caregiver continues to have questions and patient is  not have skilled nursing need to qualify for [facility] services.   Week 1 02/26/2015 Screening and [facility] Stroke Week 2 03/05/2015 Initial Assessment  Week 3 03/11/2015 Telephone Assessment Week 4 03/25/2015 Telephone Assessment  RN CM will continue services until patient and caregiver/husband have no further questions / needs.  04/02/2015 Telephone Assessment  04/15/2015 Telephone Assessment  Safety RN CM provided education on home safety alert systems.  RN CM encouraged to contact local fire department to discuss any experiences with systems in the community to help Caregiver make educated selection out of resource list provided.  RN CM also discussed option of home camera to provide additional safety for patient while home alone and create another way to monitor patient.  Goal continued:  Patient's husband continues to look at different options but has not spoken with fire department yet.   RN CM sent Epic Communication to Dr. Leonie Man regarding request for earlier appt.   RN CM advised to please notify MD of any changes in condition prior to scheduled appt's.  RN CM provided contact name and # [phone] or main office # [phone] and 24-hour nurse line # [phone].  RN CM confirmed patient is aware of 911 services for urgent emergency needs.  [name], RN, BSN, [facility], CCM  [employer] Management Coordinator [phone] Direct [phone] Cell [phone] Office [phone] Fax

## 2015-04-16 ENCOUNTER — Ambulatory Visit (INDEPENDENT_AMBULATORY_CARE_PROVIDER_SITE_OTHER): Payer: 59 | Admitting: *Deleted

## 2015-04-16 ENCOUNTER — Telehealth: Payer: Self-pay | Admitting: Internal Medicine

## 2015-04-16 DIAGNOSIS — I480 Paroxysmal atrial fibrillation: Secondary | ICD-10-CM

## 2015-04-16 DIAGNOSIS — I4892 Unspecified atrial flutter: Secondary | ICD-10-CM

## 2015-04-16 DIAGNOSIS — Z5181 Encounter for therapeutic drug level monitoring: Secondary | ICD-10-CM

## 2015-04-16 DIAGNOSIS — Q211 Atrial septal defect: Secondary | ICD-10-CM

## 2015-04-16 DIAGNOSIS — I6350 Cerebral infarction due to unspecified occlusion or stenosis of unspecified cerebral artery: Secondary | ICD-10-CM | POA: Diagnosis not present

## 2015-04-16 DIAGNOSIS — Q2112 Patent foramen ovale: Secondary | ICD-10-CM

## 2015-04-16 LAB — POCT INR: INR: 2.6

## 2015-04-19 ENCOUNTER — Telehealth: Payer: Self-pay | Admitting: Internal Medicine

## 2015-04-19 NOTE — Telephone Encounter (Signed)
Erroneous entry

## 2015-04-20 ENCOUNTER — Ambulatory Visit: Payer: 59

## 2015-04-20 DIAGNOSIS — R4701 Aphasia: Secondary | ICD-10-CM | POA: Diagnosis not present

## 2015-04-20 DIAGNOSIS — R482 Apraxia: Secondary | ICD-10-CM

## 2015-04-20 NOTE — Patient Instructions (Signed)
  Practice with speech exercises at LEAST 5 days per week, AT LEAST 30 minutes straight.  As you and Kendra [name] feel you continue to improve you could decrease to 4 days a week and then 3 days a week and 2 days

## 2015-04-20 NOTE — Therapy (Signed)
[facility] [address] [place], [place], [address] Phone: [phone]   Fax:  [phone]  Speech Language Pathology Treatment  Patient Details  Name: Kendra [name] MRN: [id number] Date of Birth: [birth year] Referring Provider: Sherren Mocha , M.D.  Encounter Date: 04/20/2015      End of Session - 04/20/15 1656    Visit Number 10   Number of Visits 17   Date for SLP Re-Evaluation 05/14/15   Authorization Type [employer] 20 visit limit ST   Authorization - Visit Number 10   Authorization - Number of Visits 20   SLP Start Time 3762   SLP Stop Time  1230   SLP Time Calculation (min) 43 min   Activity Tolerance Patient tolerated treatment well      Past Medical History  Diagnosis Date  . 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 Atrial flutter ([place])   . Atrial fibrillation ([facility])     chronic anticoag  . Arthus phenomenon   . Hypercholesterolemia     Pure  . GERD (gastroesophageal reflux disease)   . Kidney infarction [facility])   . TIA (transient ischemic attack)     as per 07/30/00 note from [place]   . Hx of blood clots   . Hemorrhoids   . 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 Ablasion    . Cesarean section    . Breast biopsy Right   . Appendectomy    . Fibroid tumor removal  1996  . Cardiac electrophysiology study and ablation    . Radiology with anesthesia N/A 02/16/2015    Procedure: RADIOLOGY WITH ANESTHESIA;  Surgeon: Medication Radiologist, MD;  Location: [facility];  Service: Radiology;  Laterality: N/A;  . Tee without cardioversion N/A 03/08/2015    Procedure: TRANSESOPHAGEAL ECHOCARDIOGRAM (TEE);  Surgeon: [name], MD;  Location: [facility];  Service: Cardiovascular;  Laterality: N/A;    There were no vitals filed for this visit.  Visit Diagnosis: Expressive aphasia  Verbal apraxia      Subjective Assessment - 04/20/15 1152     Subjective Pt has continued to perform homework with husband. "She's feeling close to normal so she's not as excited about (the homework)."               ADULT SLP TREATMENT - 04/20/15 1649    General Information   Behavior/Cognition Alert;Cooperative;Pleasant mood   Treatment Provided   Treatment provided Cognitive-Linquistic   Cognitive-Linquistic Treatment   Treatment focused on Aphasia   Skilled Treatment In conversation today pt had rare, min episodes of vague language indicative of expressive aphasia. Mild rare verbal apraxia observed by SLP x1. Pt appeared unaware or unconcerned to correct it. Husband and pt agree that pt is performing WNL, definitely functional, with all levels of communicaiton. (home management/self-care: 9 minutes):Pt's husband with questions of what are best home tasks for pt to engage in. SLP provided more conversation-type tasks for pt and encouraged her to spend 5 days per week 30 minutes per day and taper as she/husband see fit as she continues to progress with lingistic/expression skills.    Assessment / Recommendations / Plan   Plan Continue with current plan of care  Pt will come for one visit in two weeks, possible d/c then   Progression Toward Goals   Progression toward goals Progressing toward goals          SLP Education -  04-22-15 1655    Education provided Yes   Education Details home tasks (crosswords, structured conversation tasks), practice program   Person(s) Educated Patient;Spouse   Methods Explanation;Handout   Comprehension Verbalized understanding          SLP Short Term Goals - 04/14/15 1204    SLP SHORT TERM GOAL #1   Title pt will write short notes with independence, given self-correction   Status Partially Met   SLP SHORT TERM GOAL #2   Title pt will engage in mod complex description tasks with 90% success and use of strategies   Status Achieved   SLP SHORT TERM GOAL #3   Title pt to demo simple conversation with  use of compensations with rare min A   Status Achieved          SLP Long Term Goals - 04-22-2015 1656    SLP LONG TERM GOAL #1   Title pt to write short paragraphs (5-7 sentences) and ID errors independently   Time 5   Period Weeks   Status Deferred  pt now unconcerned about writing April 22, 2015   SLP LONG TERM GOAL #2   Title pt will engage in 10 minutes mod complex conversation using compensations, with rare min A   Time 4   Period Weeks   Status On-going   SLP LONG TERM GOAL #3   Title pt will engage in 5 minutes complex conversation using compensations, with occasional min A   Time 4   Period Weeks   Status On-going          Plan - 04/22/15 1318    Clinical Impression Statement Pt with improved linguistic fluency continued today.  Continue skilled ST to maximiize  carryover of compnesations for aphasia for improved independence. Pt with good to excellent success in conversation today.   Speech Therapy Frequency 1x /week   Duration --  2 visits   Treatment/Interventions Language facilitation;Cueing hierarchy;Multimodal communcation approach;Functional tasks;Patient/family education;Compensatory strategies;SLP instruction and feedback;Internal/external aids   Potential to Achieve Goals Good          G-Codes - April 22, 2015 1657    Functional Assessment Tool Used noms-15% impaired   Functional Limitations Spoken language expressive   Spoken Language Expression Current Status [phone]) At least 1 percent but less than 20 percent impaired, limited or restricted   Spoken Language Expression Goal Status ([id number]) At least 1 percent but less than 20 percent impaired, limited or restricted     Speech Therapy Progress Note  Dates of Reporting Period: 03-17-15 to present  Objective Reports of Subjective Statement: Pt has completed ST sessions targeting improved verbal expression skills.  Objective Measurements: Pt's need for SLP cueing has decr'd since evaluation.   Goal Update: See  "ST short/long term goals" above.  Plan: Pt will likely be seen one to two more sessions and then discharged.   Reason Skilled Services are Required: Skilled ST is needed for 1-2 more sessions to ensure consistency in improved verbal expression over time.   Problem List Patient Active Problem List   Diagnosis Date Noted  . 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 Encounter for therapeutic drug monitoring 03/15/2015  . PFO (patent foramen ovale) 02/19/2015  . Acute respiratory failure with hypoxia ([place]) 02/16/2015  . Essential hypertension 02/16/2015  . Acute encephalopathy 02/16/2015  . 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 Insomnia 01/26/2011  . GERD 02/16/2009  . TRANSIENT ISCHEMIC ATTACKS, HX OF 02/16/2009  . CHICKENPOX, HX OF  02/16/2009  . Essential hypertension, benign 12/04/2008  . Atrial fibrillation ([place]) 11/18/2008  . ATRIAL FLUTTER 11/18/2008  . PURE HYPERCHOLESTEROLEMIA 07/20/2008  . ARTHUS PHENOMENON 07/20/2008    [facility] ,MS, CCC-SLP  04/20/2015, 4:58 PM  [facility] [address] [place], [place], [address] Phone: [phone]   Fax:  [phone]   Name: Kendra [name] MRN: [id number] Date of Birth: May 29, 1945

## 2015-04-22 ENCOUNTER — Encounter: Payer: Self-pay | Admitting: Speech Pathology

## 2015-04-22 ENCOUNTER — Other Ambulatory Visit: Payer: Self-pay

## 2015-04-22 NOTE — Patient Outreach (Signed)
[facility]) Care Management  04/22/2015  Kendra [name] [birth year] [id number]  [facility] Stroke Program:    Care Coordination contact call to patient.   RN CM continues to work with patient regarding request to move appt up earlier than the 05/12/2015 appt with Dr. Leonie Man.  RN CM contacted Dr. Leonie Man and office nurse was going to call patient to move up appt.  Patient reports she has not heard from anyone yet.    Plan: RN CM sent 2nd request to Dr. Leonie Man via Epic in basket.   RN CM will continue to follow until current appt issue resolved. Patient has no questions today; states recovery continues but looking forward to appt with Dr. Leonie Man.    [name], RN, BSN, [facility], CCM  [employer] Management Coordinator [phone] Direct [phone] Cell [phone] Office [phone] Fax

## 2015-04-23 ENCOUNTER — Ambulatory Visit (INDEPENDENT_AMBULATORY_CARE_PROVIDER_SITE_OTHER): Payer: 59 | Admitting: *Deleted

## 2015-04-23 DIAGNOSIS — Z5181 Encounter for therapeutic drug level monitoring: Secondary | ICD-10-CM | POA: Diagnosis not present

## 2015-04-23 DIAGNOSIS — Q211 Atrial septal defect: Secondary | ICD-10-CM

## 2015-04-23 DIAGNOSIS — I4892 Unspecified atrial flutter: Secondary | ICD-10-CM

## 2015-04-23 DIAGNOSIS — Q2112 Patent foramen ovale: Secondary | ICD-10-CM

## 2015-04-23 DIAGNOSIS — I6350 Cerebral infarction due to unspecified occlusion or stenosis of unspecified cerebral artery: Secondary | ICD-10-CM

## 2015-04-23 DIAGNOSIS — I480 Paroxysmal atrial fibrillation: Secondary | ICD-10-CM

## 2015-04-23 LAB — POCT INR: INR: 3.2

## 2015-04-26 ENCOUNTER — Ambulatory Visit (INDEPENDENT_AMBULATORY_CARE_PROVIDER_SITE_OTHER): Payer: 59 | Admitting: Physician Assistant

## 2015-04-26 ENCOUNTER — Encounter: Payer: Self-pay | Admitting: Physician Assistant

## 2015-04-26 VITALS — BP 126/66 | HR 72 | Ht 68.0 in | Wt 150.8 lb

## 2015-04-26 DIAGNOSIS — I4891 Unspecified atrial fibrillation: Secondary | ICD-10-CM

## 2015-04-26 DIAGNOSIS — D66 Hereditary factor VIII deficiency: Secondary | ICD-10-CM

## 2015-04-26 DIAGNOSIS — I513 Intracardiac thrombosis, not elsewhere classified: Secondary | ICD-10-CM

## 2015-04-26 DIAGNOSIS — I5189 Other ill-defined heart diseases: Secondary | ICD-10-CM | POA: Diagnosis not present

## 2015-04-26 DIAGNOSIS — Q211 Atrial septal defect, unspecified: Secondary | ICD-10-CM

## 2015-04-26 LAB — BASIC METABOLIC PANEL WITH GFR
BUN: 14 mg/dL (ref 7–25)
CO2: 25 mmol/L (ref 20–31)
Calcium: 9.7 mg/dL (ref 8.6–10.4)
Chloride: 99 mmol/L (ref 98–110)
Creat: 0.81 mg/dL (ref 0.60–0.93)
Glucose, Bld: 82 mg/dL (ref 65–99)
Potassium: 4.7 mmol/L (ref 3.5–5.3)
Sodium: 135 mmol/L (ref 135–146)

## 2015-04-26 LAB — PROTIME-INR
INR: 2.63 — ABNORMAL HIGH
Prothrombin Time: 28.5 s — ABNORMAL HIGH (ref 11.6–15.2)

## 2015-04-26 NOTE — Assessment & Plan Note (Signed)
Has been paroxysmal but not documented in several years.

## 2015-04-26 NOTE — Patient Instructions (Addendum)
Medication Instructions:  Your physician recommends that you continue on your current medications as directed. Please refer to the Current Medication list given to you today.   If you need a refill on your cardiac medications before your next appointment, please call your pharmacy.  Labwork: BMET TODAY   Testing/Procedures:  SEE LETTER FOR TEE INSTRUCTIONS  04/28/15   Follow-Up:  WITH DR COOPER 2 WEEKS AFTER TEE  04/28/15 ...   Any Other Special Instructions Will Be Listed Below (If Applicable).Marland Kitchen

## 2015-04-26 NOTE — Assessment & Plan Note (Addendum)
Patient had documented left atrial appendage thrombus on TEE and has been anticoagulated with Coumadin therapeutically for 6 weeks. She is scheduled for another TEE Wednesday at 1:30 by Dr. Meda Coffee to reassess clot. Follow-up with Dr. Burt Knack after.     [facility] has been requested to perform a transesophageal echocardiogram.  After careful review of history and examination, the risks and benefits of transesophageal echocardiogram have been explained including risks of esophageal damage, perforation (1:10,000 risk), bleeding, pharyngeal hematoma as well as other potential complications associated with conscious sedation including aspiration, arrhythmia, respiratory failure and death. Alternatives to treatment were discussed, questions were answered. Patient is willing to proceed.   Kendra [name],  04/26/2015 3:20 PM

## 2015-04-26 NOTE — Assessment & Plan Note (Signed)
Patient has hyper quite guilt both disorder with renal infarcts 2, TIA and recent stroke a full dose dabigitran..  Followed by Dr. Beryle Beams. Now on Coumadin.

## 2015-04-26 NOTE — Assessment & Plan Note (Signed)
Patient has small ASD. Followed by Dr. Burt Knack

## 2015-04-26 NOTE — Progress Notes (Signed)
Cardiology Office Note   Date:  04/26/2015   ID:  [name], Kendra [birth year], MRN [id number]  PCP:  Crecencio Mc, MD  Cardiologist:  Dr.Cooper  Chief Complaint:to be scheduled for TEE    History of Present Illness: Kendra [name] is a 70 y.o. female who presents  To be scheduled for TEE.She has a complex history of hypercoagulable disorder (elevated factor VIII levels), paroxysmal atrial flutter, and multiple embolic events (renal infarcts x 2, TIA, and recent stroke). She was recently seen 03/04/2015 for consideration of treatment options related to PFO/ASD and recurrent embolism. A TEE was  Reviewed by Dr. Burt Knack showed a small second OM ASD with left to right flow. There is thrombus sounds spontaneous contrast in the left atrial appendage. LV function is normal.She's had a stroke despite therapeutic anticoagulation and essentially represents a treatment failure. It's possible that her small ASD is a residual defect after undergoing previous transseptal procedures. It does not have the appearance of a typical PFO as there is a small defect adjacent to the foramen with continuous left-to-right flow. The bigger issue at this point is the presence of left atrial appendage thrombus despite therapeutic anticoagulation. After review with colleagues and discussion of treatment options, it's probably best to transition her from Eliquis to warfarin. She was bridged with Lovenox and warfarin and continued on aspirin on 03/10/15 With plans of repeat TEE after 6 weeks of therapeutic anticoagulation.   in the interim patient saw Dr. Beryle Beams who recommended that the patient's INRs be kept in the 2-3 range as she could not afford to be subtherapeutic.   Patient is here today accompanied by her husband. She has had therapeutic INRs since 03/19/15. She has had no change  Her cardiac complaints since her last office visit. TEE is scheduled for Wednesday at 1:30. Risks have been discussed with her in  detail.   Past Medical History  Diagnosis Date  . 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 Atrial flutter ([place])   . Atrial fibrillation (HCC)     chronic anticoag  . Arthus phenomenon   . Hypercholesterolemia     Pure  . GERD (gastroesophageal reflux disease)   . Kidney infarction [facility])   . TIA (transient ischemic attack)     as per 07/30/00 note from [place] [address]   . Hx of blood clots   . Hemorrhoids   . 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 Ablasion    . Cesarean section    . Breast biopsy Right   . Appendectomy    . Fibroid tumor removal  1996  . Cardiac electrophysiology study and ablation    . Radiology with anesthesia N/A 02/16/2015    Procedure: RADIOLOGY WITH ANESTHESIA;  Surgeon: Medication Radiologist, MD;  Location: [facility];  Service: Radiology;  Laterality: N/A;  . Tee without cardioversion N/A 03/08/2015    Procedure: TRANSESOPHAGEAL ECHOCARDIOGRAM (TEE);  Surgeon: [name], MD;  Location: [facility];  Service: Cardiovascular;  Laterality: N/A;     Current Outpatient Prescriptions  Medication Sig Dispense Refill  . aspirin 81 MG tablet Take 81 mg by mouth daily.    . Fish Oil-Cholecalciferol (FISH OIL + D3 PO) Take 1 capsule by mouth daily.    . metoprolol succinate (TOPROL-XL) 100 MG 24 hr tablet Take 100 mg by mouth daily. Take with or immediately following a meal.    . Multiple Vitamin (MULTIVITAMIN) tablet Take  1 tablet by mouth daily.      Marland Kitchen warfarin (COUMADIN) 5 MG tablet Take as directed by Anticoagulation Clinic (Patient taking differently: Take as directed by Anticoagulation Clinic Dose on 04/12/15: 5 mg daily except 2.5 mg on Tuesdays) 30 tablet 2   No current facility-administered medications for this visit.    Allergies:   Amiodarone hcl    Social History:  The patient  reports that she has never smoked. She has never used smokeless tobacco. She reports that she does not drink  alcohol or use illicit drugs.   Family History:  [name]    family history includes Alcohol abuse in her father; Lung cancer in her father; Other in her brother. There is no history of Colon cancer.    ROS:  Please see the history of present illness.   Otherwise, review of systems are positive for none.   All other systems are reviewed and negative.    PHYSICAL EXAM: VS:  BP 126/66 mmHg  Pulse 72  Ht 5\' 8"  (1.727 m)  Wt 150 lb 12.8 oz (68.402 kg)  BMI 22.93 kg/m2  SpO2 98% , BMI Body mass index is 22.93 kg/(m^2). GEN: Well nourished, well developed, in no acute distress Neck: no JVD, HJR, carotid bruits, or masses Cardiac: RRR; no murmurs,gallop, rubs, thrill or heave,  Respiratory:  clear to auscultation bilaterally, normal work of breathing GI: soft, nontender, nondistended, + BS MS: no deformity or atrophy Extremities: without cyanosis, clubbing, edema, good distal pulses bilaterally.  Skin: warm and dry, no rash Neuro:  Strength and sensation are intact    EKG:  EKG is not ordered today.    Recent Labs: 02/16/2015: ALT 17 02/18/2015: BUN 6; Creatinine, Ser 0.62; Hemoglobin 12.2; Magnesium 1.6*; Platelets 186; Potassium 3.8; Sodium 132*    Lipid Panel    Component Value Date/Time   CHOL 121 02/17/2015 0615   TRIG 68 02/19/2015 1450   HDL 30* 02/17/2015 0615   CHOLHDL 4.0 02/17/2015 0615   VLDL 24 02/17/2015 0615   LDLCALC 67 02/17/2015 0615      Wt Readings from Last 3 Encounters:  04/26/15 150 lb 12.8 oz (68.402 kg)  04/12/15 155 lb 1.6 oz (70.353 kg)  03/10/15 150 lb (68.04 kg)      Other studies Reviewed: Additional studies/ records that were reviewed today include and review of the records demonstrates: Study Conclusions  - Left ventricle: Systolic function was normal. The estimated   ejection fraction was in the range of 60% to 65%. Wall motion was   normal; there were no regional wall motion abnormalities. - Aortic valve: No evidence of  vegetation. There was no   regurgitation. - Mitral valve: No evidence of vegetation. There was mild   regurgitation. - Left atrium: Left atrial appendage is large with decreased   filling and emptying velocities. There is severe smoke/sludge and   a thrombus in the tip of the appendage measuring 21 x 12 mm. The   atrium was moderately dilated. - Right ventricle: The cavity size was normal. Wall thickness was   normal. Systolic function was normal. - Right atrium: No evidence of thrombus in the atrial cavity or   appendage. No evidence of thrombus in the atrial cavity or   appendage. - Atrial septum: There is septum secundum type ASD measuring 5 x 2   mm with left to right flow. - Tricuspid valve: There was mild regurgitation. - Pulmonic valve: Structurally normal valve. - Pulmonary arteries: The main  pulmonary artery was normal-sized. - Pericardium, extracardiac: There was no pericardial effusion.  Impressions:  - A large left atrial appendage with decreased velocities and an   evidence of a thrombus.     A small secundum type ASD with left to right flow is present.    ASSESSMENT AND PLAN: Thrombus of left atrial appendage  Patient had documented left atrial appendage thrombus on TEE and has been anticoagulated with Coumadin therapeutically for 6 weeks. She is scheduled for another TEE Wednesday at 1:30 by Dr. Meda Coffee to reassess clot. Follow-up with Dr. Burt Knack after.     [facility] has been requested to perform a transesophageal echocardiogram.  After careful review of history and examination, the risks and benefits of transesophageal echocardiogram have been explained including risks of esophageal damage, perforation (1:10,000 risk), bleeding, pharyngeal hematoma as well as other potential complications associated with conscious sedation including aspiration, arrhythmia, respiratory failure and death. Alternatives to treatment were discussed, questions were answered. Patient  is willing to proceed.   Ermalinda Barrios,  04/26/2015 3:20 PM    ASD (atrial septal defect)  Patient has small ASD. Followed by Dr. Burt Knack  Atrial fibrillation [facility])  Has been paroxysmal but not documented in several years.  Congenital factor VIII disorder (HCC)  Patient has hyper quite guilt both disorder with renal infarcts 2, TIA and recent stroke a full dose dabigitran..  Followed by Dr. Beryle Beams. Now on Coumadin.     Sumner Boast, PA-C  04/26/2015 2:33 PM    [facility] [place], [place], [place]  [address] Phone: [phone]; Fax: [phone]

## 2015-04-27 ENCOUNTER — Encounter: Payer: Self-pay | Admitting: Internal Medicine

## 2015-04-27 ENCOUNTER — Ambulatory Visit (INDEPENDENT_AMBULATORY_CARE_PROVIDER_SITE_OTHER): Payer: 59 | Admitting: Internal Medicine

## 2015-04-27 VITALS — BP 126/60 | HR 67 | Temp 97.8°F | Resp 12 | Ht 67.5 in | Wt 150.2 lb

## 2015-04-27 DIAGNOSIS — K648 Other hemorrhoids: Secondary | ICD-10-CM

## 2015-04-27 DIAGNOSIS — I6350 Cerebral infarction due to unspecified occlusion or stenosis of unspecified cerebral artery: Secondary | ICD-10-CM

## 2015-04-27 DIAGNOSIS — I5189 Other ill-defined heart diseases: Secondary | ICD-10-CM | POA: Diagnosis not present

## 2015-04-27 DIAGNOSIS — I513 Intracardiac thrombosis, not elsewhere classified: Secondary | ICD-10-CM

## 2015-04-27 DIAGNOSIS — K219 Gastro-esophageal reflux disease without esophagitis: Secondary | ICD-10-CM | POA: Diagnosis not present

## 2015-04-27 DIAGNOSIS — G4700 Insomnia, unspecified: Secondary | ICD-10-CM

## 2015-04-27 DIAGNOSIS — I10 Essential (primary) hypertension: Secondary | ICD-10-CM

## 2015-04-27 NOTE — Progress Notes (Signed)
Subjective:  Patient ID: Kendra [name], female    DOB: [birth year]  Age: 70 y.o. MRN: [id number]  CC: The primary encounter diagnosis was Thrombus of left atrial appendage. 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  HPI Kendra [name] presents for new patient evaluation.  Referred by Harle Battiest, MD .   She was recently hospitalized for  recurrent embolic infarcts, most recently to left iCA, left MCA and Left ACA  Dec 2017. Marland Kitchen  Had a thrombectomy with 100% efficacy. No residual  Deficits except intermitted apraxia.   Occurred after having a fall down the stairs with LOC.  Occipital bruise . CT Head was done in ER and sent home.  2 days later  Husband noticed her facial changes and dialed 911 for code stroke. Taken to [employer] radiologist Deveshwar did the surgery.  (See above)  History of cardiac ablation in 2002 at [facility] for atrial fib. Left atrium damage noted  during the 12 hour procedure.  .  Had a TEE large left atrial thrombus jan 2017 and atrial septal defect with left to right blood flow, changed from Pradaxa to coumadin by cardiology iNR goal 2.5 to 3.5 . Repeat TEE tomorrow.    Has received speech and occupational therapy. [place] neurologist   Cc: 1) hemorrhoids with some bleeding recently sues a cream    2) mammogram in November at [place].  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   She has past surgical history that includes ablasion;  Cesarean section; Breast biopsy (Right); Appendectomy; fibroid tumor removal (1996); Cardiac electrophysiology study and ablation; Radiology with anesthesia (N/A, 02/16/2015); and TEE without cardioversion (N/A, 03/08/2015).   Her family history includes Alcohol abuse in her father; Lung cancer in her father; Other in her brother. There is no history of Colon cancer.She reports that she has never smoked. She has never used smokeless tobacco. She reports that she does not drink alcohol or use illicit drugs.  Outpatient Prescriptions Prior to Visit  Medication Sig Dispense Refill  . aspirin 81 MG tablet Take 81 mg by mouth daily.    . Fish Oil-Cholecalciferol (FISH OIL + D3 PO) Take 1 capsule by mouth daily.    . metoprolol succinate (TOPROL-XL) 100 MG 24 hr tablet Take 100 mg by mouth daily. Take with or immediately following a meal.    . Multiple Vitamin (MULTIVITAMIN) tablet Take 1 tablet by mouth daily.      Marland Kitchen warfarin (COUMADIN) 5 MG tablet Take as directed by Anticoagulation Clinic (Patient taking differently: Take as directed by Anticoagulation Clinic Dose on 04/12/15: 5 mg daily except 2.5 mg on Tuesdays) 30 tablet 2   No facility-administered medications prior to visit.    Review of Systems:  Patient denies headache, fevers, malaise, unintentional weight loss, skin rash, eye pain, sinus congestion and sinus pain, sore throat, dysphagia,  hemoptysis , cough, dyspnea, wheezing, chest pain, palpitations, orthopnea, edema, abdominal pain, nausea, melena, diarrhea, constipation, flank pain, dysuria, hematuria, urinary  Frequency, nocturia, numbness, tingling, seizures,  Focal weakness, Loss of consciousness,  Tremor, insomnia, depression, anxiety, and suicidal  ideation.     Objective:  BP 126/60 mmHg  Pulse 67  Temp(Src) 97.8 F (36.6 C) (Oral)  Resp 12  Ht 5' 7.5" (1.715 m)  Wt 150 lb 4 oz (68.153 kg)  BMI 23.17 kg/m2  SpO2 98%  Physical Exam:  General appearance: alert, cooperative  and appears stated age Head: Normocephalic, without obvious abnormality, atraumatic Eyes: conjunctivae/corneas clear. PERRL, EOM's intact. Fundi benign. Ears: normal TM's and external ear canals both ears Nose: Nares normal. Septum midline. Mucosa normal. No drainage or sinus tenderness. Throat: lips, mucosa, and tongue normal; teeth and gums normal Neck: no adenopathy, no carotid bruit, no JVD, supple, symmetrical, trachea midline and thyroid not enlarged, symmetric, no tenderness/mass/nodules Lungs: clear to auscultation bilaterally Breasts: normal appearance, no masses or tenderness Heart: regular rate and rhythm, S1, S2 normal, no murmur, click, rub or gallop Abdomen: soft, non-tender; bowel sounds normal; no masses,  no organomegaly Extremities: extremities normal, atraumatic, no cyanosis or edema Pulses: 2+ and symmetric Skin: Skin color, texture, turgor normal. No rashes or lesions Neurologic: Alert and oriented X 3, normal strength and tone. Normal symmetric reflexes. Normal coordination and gait.     Assessment & Plan:   Problem List Items Addressed This Visit    GERD    Managed with prn H2 blocker.       Insomnia    Discussed natural remedies for insomnia including herbal tea and melatonin.  Reviewd principles of good sleep hygiene      Essential hypertension    Well controlled on current regimen. Renal function stable, no changes today.  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  Continue coumadin.      Thrombus of left atrial appendage - Primary    Managed with coumdin,  Possible Watchman's procedure anticipated.  Repeat TEE to be done this week       Hemorrhoid    Recommend use of colace to avoid  irritation.  Refill on steroid cream discussed         A total of 45 minutes was spent with patient more than half of which was spent in counseling patient on the above mentioned issues , reviewing and explaining recent labs and imaging studies done, and coordination of care.    I am having Ms. Faiola maintain her multivitamin, metoprolol succinate, Fish Oil-Cholecalciferol (FISH OIL + D3 PO), aspirin, and warfarin.  No orders of the defined types were placed in this encounter.    There are no discontinued medications.  Follow-up: Return in about 6 months (around 10/25/2015).   Crecencio Mc, MD

## 2015-04-27 NOTE — Patient Instructions (Signed)
You should try taking colace 100 mg daily to maintain softer and prevent hemorrhoid irritation  Call me back with name of cream

## 2015-04-27 NOTE — Progress Notes (Signed)
Pre-visit discussion using our clinic review tool. No additional management support is needed unless otherwise documented below in the visit note.  

## 2015-04-28 ENCOUNTER — Ambulatory Visit (HOSPITAL_BASED_OUTPATIENT_CLINIC_OR_DEPARTMENT_OTHER): Payer: 59

## 2015-04-28 ENCOUNTER — Encounter (HOSPITAL_COMMUNITY)
Admission: RE | Disposition: A | Discharge status: Home / Self Care | Payer: Self-pay | Source: Ambulatory Visit | Attending: Cardiology

## 2015-04-28 ENCOUNTER — Encounter (HOSPITAL_COMMUNITY): Payer: Self-pay

## 2015-04-28 ENCOUNTER — Ambulatory Visit (HOSPITAL_COMMUNITY)
Admission: RE | Admit: 2015-04-28 | Discharge: 2015-04-28 | Disposition: A | Discharge status: Home / Self Care | Payer: 59 | Source: Ambulatory Visit | Attending: Cardiology | Admitting: Cardiology

## 2015-04-28 DIAGNOSIS — I513 Intracardiac thrombosis, not elsewhere classified: Secondary | ICD-10-CM | POA: Insufficient documentation

## 2015-04-28 DIAGNOSIS — Q211 Atrial septal defect: Secondary | ICD-10-CM | POA: Insufficient documentation

## 2015-04-28 DIAGNOSIS — Z7901 Long term (current) use of anticoagulants: Secondary | ICD-10-CM | POA: Insufficient documentation

## 2015-04-28 DIAGNOSIS — I4891 Unspecified atrial fibrillation: Secondary | ICD-10-CM | POA: Insufficient documentation

## 2015-04-28 DIAGNOSIS — D688 Other specified coagulation defects: Secondary | ICD-10-CM | POA: Insufficient documentation

## 2015-04-28 DIAGNOSIS — Z79899 Other long term (current) drug therapy: Secondary | ICD-10-CM | POA: Diagnosis not present

## 2015-04-28 DIAGNOSIS — E7800 Pure hypercholesterolemia, unspecified: Secondary | ICD-10-CM | POA: Insufficient documentation

## 2015-04-28 DIAGNOSIS — Z8673 Personal history of transient ischemic attack (TIA), and cerebral infarction without residual deficits: Secondary | ICD-10-CM | POA: Diagnosis not present

## 2015-04-28 DIAGNOSIS — I340 Nonrheumatic mitral (valve) insufficiency: Secondary | ICD-10-CM | POA: Diagnosis not present

## 2015-04-28 DIAGNOSIS — Z7982 Long term (current) use of aspirin: Secondary | ICD-10-CM | POA: Diagnosis not present

## 2015-04-28 DIAGNOSIS — K219 Gastro-esophageal reflux disease without esophagitis: Secondary | ICD-10-CM | POA: Insufficient documentation

## 2015-04-28 HISTORY — PX: TEE WITHOUT CARDIOVERSION: SHX5443

## 2015-04-28 SURGERY — ECHOCARDIOGRAM, TRANSESOPHAGEAL
Anesthesia: Moderate Sedation

## 2015-04-28 MED ORDER — BUTAMBEN-TETRACAINE-BENZOCAINE 2-2-14 % EX AERO
INHALATION_SPRAY | CUTANEOUS | Status: DC | PRN
  Administered 2015-04-28: 2 via TOPICAL

## 2015-04-28 MED ORDER — MIDAZOLAM HCL 10 MG/2ML IJ SOLN
INTRAMUSCULAR | Status: DC | PRN
  Administered 2015-04-28 (×2): 2 mg via INTRAVENOUS

## 2015-04-28 MED ORDER — FENTANYL CITRATE (PF) 100 MCG/2ML IJ SOLN
INTRAMUSCULAR | Status: DC | PRN
  Administered 2015-04-28: 25 ug via INTRAVENOUS
  Administered 2015-04-28: 50 ug via INTRAVENOUS

## 2015-04-28 MED ORDER — DIPHENHYDRAMINE HCL 50 MG/ML IJ SOLN
INTRAMUSCULAR | Status: AC
  Filled 2015-04-28: qty 1

## 2015-04-28 MED ORDER — FENTANYL CITRATE (PF) 100 MCG/2ML IJ SOLN
INTRAMUSCULAR | Status: AC
  Filled 2015-04-28: qty 2

## 2015-04-28 MED ORDER — SODIUM CHLORIDE 0.9 % IV SOLN: INTRAVENOUS | Status: DC

## 2015-04-28 MED ORDER — MIDAZOLAM HCL 5 MG/ML IJ SOLN
INTRAMUSCULAR | Status: AC
  Filled 2015-04-28: qty 2

## 2015-04-28 MED ORDER — PERFLUTREN LIPID MICROSPHERE
INTRAVENOUS | Status: DC | PRN
  Administered 2015-04-28: 2 mL via INTRAVENOUS

## 2015-04-28 MED ORDER — PERFLUTREN LIPID MICROSPHERE
INTRAVENOUS | Status: AC
  Filled 2015-04-28: qty 10

## 2015-04-28 NOTE — Discharge Instructions (Signed)
Transesophageal Echocardiogram Transesophageal echocardiography (TEE) is a picture test of your heart using sound waves. The pictures taken can give very detailed pictures of your heart. This can help your doctor see if there are problems with your heart. TEE can check:  If your heart has blood clots in it.  How well your heart valves are working.  If you have an infection on the inside of your heart.  Some of the major arteries of your heart.  If your heart valve is working after a Office manager.  Your heart before a procedure that uses a shock to your heart to get the rhythm back to normal.  AFTER THE PROCEDURE  You will be taken to a recovery area so the sedative can wear off.  Your throat may be sore and scratchy. This will go away slowly over time.  You will go home when you are fully awake and able to swallow liquids.  You should have someone stay with you for the next 24 hours.  Do not drive or operate machinery for the next 24 hours.   This information is not intended to replace advice given to you by your health care provider. Make sure you discuss any questions you have with your health care provider.   Document Released: 12/11/2008 Document Revised: 02/18/2013 Document Reviewed: 08/15/2012 Elsevier Interactive Patient Education [employer].

## 2015-04-28 NOTE — H&P (View-Only) (Signed)
Patient ID: Kendra [name], female   DOB: Jan 23, 1946, 70 y.o.   MRN: [id number] Hematology and Oncology Follow Up Visit  Kendra [name] [id number] 04-Dec-1945 70 y.o. 04/12/2015 6:34 PM   Principle Diagnosis: Encounter Diagnoses  Name Primary?  . 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 PFO (patent foramen ovale)   . 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 I saw her 7 years ago for an initial hospital consultation on 04/01/2008 with a subsequent office follow-up on 05/13/2008. At the time I first saw her she was already 15 years status post left renal infarction felt related to newly diagnosed atrial fibrillation/flutter and was on full dose warfarin anticoagulation. Medical therapy was unsuccessful in controlling her arrhythmias. Cardioversion done November 2001 with only temporary control. Initial radiofrequency ablation procedure done in [place] which was not successful then a second procedure done at [facility] by a Dr. Rolland Porter in December 2001. There is a history of a TIA in June 2002. At the time I saw her in February 2010, she was admitted to the hospital with acute onset of lower abdominal pain. She was found to have multiple infarcts in her left kidney. A single infarct in the upper pole of the right kidney. Aorta and renal arteries well visualized and appeared normal. EKG showed sinus rhythm. Echocardiogram with mild diastolic dysfunction. No obvious clots. TEE not done at that time. There was a history of a small PFO. Discussion about potential closure. I did feel that paradoxical emboli could have explained her findings at that time. Hypercoagulation evaluation was unremarkable except for a elevated clotting factor VIII of 225% (76-1 26). She tested negative for all the usual acquired an inherited coagulopathies including factor V Leiden gene mutation, her thrombin gene mutation, anticardiolipin antibodies,  anti-beta 2 glycoprotein 1 antibodies, lupus anticoagulant, and a plasma homocystine was normal. At time of admission she was slightly subtherapeutic on her Coumadin with an INR of 1.9. I recommended increasing her target INR of 2.5-3.5 so that she would be in the therapeutic range more reliably. 81 mg of aspirin added. A repeat factor VIII level remained significantly elevated at time of her office follow-up visit at 276% of control. I recommended that her family be tested. So far 2 of her daughters have been tested. One daughter now age 86 has elevated factor VIII but has not had any blood clots. Another daughter age 17 does not have an elevated factor VIII. Her 2 sons currently age 49 and 24 have not been tested. Her mother died at age 2 of postpartum complications with suspicion that she might have had pulmonary emboli. She had 2 brothers and 2 sisters nobody else had clotting problems two of her brothers are now deceased.  She is continued to have problems with atrial arrhythmias over the last 7 years. She was maintained on low-dose aspirin and Coumadin until about 5 years ago when Pradaxa became available.  Recent history: She fell down a flight of stairs on 02/13/2015. She did not strike her head. She presented 3 days later with left hemiplegia and disconjugate gaze. She had an urgent evaluation. She was found to have embolic occlusion at the terminal portion of the left internal carotid artery, the left middle cerebral artery, and the proximal left anterior cerebral artery. She underwent emergency endovascular revascularization and thrombectomy with 100% efficacy and restoration of flow. I was quite surprised when I saw her  today given this history. She has no residual focal neurologic deficits except for minimal intermittent apraxia.  A transesophageal echocardiogram done 03/08/2015 showed a 2.1 x 1.2 cm thrombus in a large left atrial appendage felt to be the focal point of her cerebral  embolism. Also noted was a septum secundum atrial septal defect 5 x 2 mm with left to right blood flow.  Of note, she was on twice daily Pradaxa, dose not specified in the history and physical exam but I assume full dose since she has normal renal function. No comment on whether she was also on aspirin. Admission EKG showed sinus rhythm. Neurologist initially recommended changing from Pradaxa to Eloquis. However, one of her cardiologists discussed the case with me while she was still in the hospital. I felt that resuming warfarin would give her better protection than changing her to a Xa inhibitor. I also supported continuing low-dose aspirin. She was transitioned back to warfarin at time of 03/10/2015 cardiology follow-up visit.   Medications: reviewed  Allergies:  Allergies  Allergen Reactions  . Amiodarone Hcl Nausea Only and Rash    Review of Systems: Hematology:  No clinical bleeding ENT ROS: Dry mouth Breast ROS:  Respiratory ROS: No dyspnea Cardiovascular ROS: No chest pain. No palpitations.  Gastrointestinal ROS:  No abdominal pain or change in bowel habit  Genito-Urinary ROS:  Musculoskeletal ROS:  Neurological ROS: No headache or change in vision. No dysarthria, no focal weakness, no paresthesias Dermatological ROS:  Remaining ROS negative:   Physical Exam: Blood pressure 132/52, pulse 72, temperature 97.9 F (36.6 C), temperature source Oral, height 5\' 8"  (1.727 m), weight 155 lb 1.6 oz (70.353 kg), SpO2 100 %. Wt Readings from Last 3 Encounters:  04/12/15 155 lb 1.6 oz (70.353 kg)  03/10/15 150 lb (68.04 kg)  03/05/15 150 lb (68.04 kg)     General appearance: Thin Caucasian woman HENNT: Pharynx no erythema, exudate, mass, or ulcer. No thyromegaly or thyroid nodules Lymph nodes: No cervical, supraclavicular, or axillary lymphadenopathy Breasts:  Lungs: Clear to auscultation, resonant to percussion throughout Heart: Regular rhythm, no murmur, no gallop, no rub, no  click, no edema Abdomen: Soft, nontender, normal bowel sounds, no mass, no organomegaly Extremities: No edema, no calf tenderness Musculoskeletal: no joint deformities GU:  Vascular: Carotid pulses 2+, no bruits, distal pulses: Dorsalis pedis 1+ symmetric Neurologic: Alert, oriented, PERRLA, optic discs sharp and vessels normal, no hemorrhage or exudate, cranial nerves grossly normal, motor strength 5 over 5, reflexes 1+ symmetric, upper body coordination normal, finger to hand, rapid alternating movements, gait normal, Skin: No rash or ecchymosis  Lab Results: CBC W/Diff    Component Value Date/Time   WBC 8.1 02/18/2015 0227   RBC 3.84* 02/18/2015 0227   HGB 12.2 02/18/2015 0227   HCT 33.4* 02/18/2015 0227   PLT 186 02/18/2015 0227   MCV 87.0 02/18/2015 0227   MCH 31.8 02/18/2015 0227   MCHC 36.5* 02/18/2015 0227   RDW 11.8 02/18/2015 0227   LYMPHSABS 1.0 02/17/2015 0615   MONOABS 0.7 02/17/2015 0615   EOSABS 0.1 02/17/2015 0615   BASOSABS 0.0 02/17/2015 0615     Chemistry      Component Value Date/Time   NA 132* 02/18/2015 0227   K 3.8 02/18/2015 0227   CL 104 02/18/2015 0227   CO2 19* 02/18/2015 0227   BUN 6 02/18/2015 0227   CREATININE 0.62 02/18/2015 0227      Component Value Date/Time   CALCIUM 7.9* 02/18/2015 0227  ALKPHOS 83 02/16/2015 0830   AST 21 02/16/2015 0830   ALT 17 02/16/2015 0830   BILITOT 0.9 02/16/2015 0830       Radiological Studies: See discussion above    Impression:  Although she has an underlying risk factor that promotes increased clotting, elevation of clotting factor VIII, the main risk factor for ongoing thromboembolic events in this woman is chronic, paroxysmal, atrial fibrillation/flutter with an enlarged, floppy, left atrial appendage as demonstrated by recent large embolus in the left atrial appendage seen on TEE.  A major cerebral embolus was not prevented by full dose dabigitran. I do not have any confidence that a Xa inhibitor  will do any better. I feel that she is best served by Coumadin with a target INR of 2.5-3.5 to maximize the chance that she will always be in the therapeutic range. There is a tendency to micromanage  Coumadin dosing. This lady cannot afford to be subtherapeutic. We know that even in large, controlled, clinical trials, with a target INR range of 2-3 we are only able to keep people in the therapeutic range about 60% of the time.  Approximately one hour spent with direct face-to-face contact with the patient and her husband including physical examination and counseling not including time for data review and dictation.   CC: Patient Care Team: Crecencio Mc, MD as PCP - General (Internal Medicine) Greg Cutter, LCSW as [place] Management (Licensed Clinical Social Worker) Standley Brooking, RN as [place] Management Pramod Clydene Fake, MD as Consulting Physician (Neurology)   Annia Belt, MD 2/13/20176:34 PM

## 2015-04-28 NOTE — Progress Notes (Signed)
  Echocardiogram Echocardiogram Transesophageal has been performed.  Johny Chess 04/28/2015, 4:09 PM

## 2015-04-28 NOTE — Interval H&P Note (Signed)
History and Physical Interval Note:  04/28/2015 2:53 PM  Kendra [name]  has presented today for surgery, with the diagnosis of Left Atrial Appendage Thrombus   The various methods of treatment have been discussed with the patient and family. After consideration of risks, benefits and other options for treatment, the patient has consented to  Procedure(s): TRANSESOPHAGEAL ECHOCARDIOGRAM (TEE) (N/A) as a surgical intervention .  [name] history has been reviewed, patient examined, no change in status, stable for surgery.  I have reviewed the patient's chart and labs.  Questions were answered to the patient's satisfaction.     [name]

## 2015-04-28 NOTE — CV Procedure (Signed)
     Transesophageal Echocardiogram Note  Kendra [name] [id number] 01-05-1946  Procedure: Transesophageal Echocardiogram Indications: H/o LAA thrombus on the TEE on 03/08/2015  Procedure Details Consent: Obtained Time Out: Verified patient identification, verified procedure, site/side was marked, verified correct patient position, special equipment/implants available, Radiology Safety Procedures followed,  medications/allergies/relevent history reviewed, required imaging and test results available.  Performed  Medications: Fentanyl: 75 Versed: 5 mg Definity contrast: 2 ml  - Left ventricle: Systolic function was normal. The estimated ejection fraction was in the range of 60% to 65%. Wall motion was normal; there were no regional wall motion abnormalities. - Aortic valve: No evidence of vegetation. There was no regurgitation. - Mitral valve: No evidence of vegetation. There was mild regurgitation. - Left atrium: Left atrial appendage is large with significantly decreased filling and emptying velocities. There is significant smoke and a small thrombus in the tip of the appendage measuring 11 x 5 mm. The atrium was moderately dilated. - Right ventricle: The cavity size was normal. Wall thickness was normal. Systolic function was normal. - Right atrium: No evidence of thrombus in the atrial cavity or appendage. No evidence of thrombus in the atrial cavity or appendage. - Atrial septum: There is a small septum secundum type ASD with left to right flow. - Tricuspid valve: There was mild regurgitation. - Pulmonic valve: Structurally normal valve. - Pulmonary arteries: The main pulmonary artery was normal-sized. - Pericardium, extracardiac: There was no pericardial effusion.  Impressions:  - When compared to the prior TEE on 03/08/2015 LAA thrombus is now smaller measuring 11 x 5 mm, previously 22 x 12 mm.   Complications: No apparent complications Patient did  tolerate procedure well.  [name], MD, [facility] 04/28/2015, 2:30 PM

## 2015-04-28 NOTE — H&P (View-Only) (Signed)
Cardiology Office Note   Date:  04/26/2015   ID:  [name], [name] 1946-01-29, MRN [id number]  PCP:  Crecencio Mc, MD  Cardiologist:  Dr.Cooper  Chief Complaint:to be scheduled for TEE    History of Present Illness: Kendra [name] is a 70 y.o. female who presents  To be scheduled for TEE.She has a complex history of hypercoagulable disorder (elevated factor VIII levels), paroxysmal atrial flutter, and multiple embolic events (renal infarcts x 2, TIA, and recent stroke). She was recently seen 03/04/2015 for consideration of treatment options related to PFO/ASD and recurrent embolism. A TEE was  Reviewed by Dr. Burt Knack showed a small second OM ASD with left to right flow. There is thrombus sounds spontaneous contrast in the left atrial appendage. LV function is normal.She's had a stroke despite therapeutic anticoagulation and essentially represents a treatment failure. It's possible that her small ASD is a residual defect after undergoing previous transseptal procedures. It does not have the appearance of a typical PFO as there is a small defect adjacent to the foramen with continuous left-to-right flow. The bigger issue at this point is the presence of left atrial appendage thrombus despite therapeutic anticoagulation. After review with colleagues and discussion of treatment options, it's probably best to transition her from Eliquis to warfarin. She was bridged with Lovenox and warfarin and continued on aspirin on 03/10/15 With plans of repeat TEE after 6 weeks of therapeutic anticoagulation.   in the interim patient saw Dr. Beryle Beams who recommended that the patient's INRs be kept in the 2-3 range as she could not afford to be subtherapeutic.   Patient is here today accompanied by her husband. She has had therapeutic INRs since 03/19/15. She has had no change  Her cardiac complaints since her last office visit. TEE is scheduled for Wednesday at 1:30. Risks have been discussed with her in  detail.   Past Medical History  Diagnosis Date  . 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 Atrial flutter ([place])   . Atrial fibrillation (HCC)     chronic anticoag  . Arthus phenomenon   . Hypercholesterolemia     Pure  . GERD (gastroesophageal reflux disease)   . Kidney infarction [facility])   . TIA (transient ischemic attack)     as per 07/30/00 note from [place]   . Hx of blood clots   . Hemorrhoids   . 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 Ablasion    . Cesarean section    . Breast biopsy Right   . Appendectomy    . Fibroid tumor removal  1996  . Cardiac electrophysiology study and ablation    . Radiology with anesthesia N/A 02/16/2015    Procedure: RADIOLOGY WITH ANESTHESIA;  Surgeon: Medication Radiologist, MD;  Location: [place] NEURO ORS;  Service: Radiology;  Laterality: N/A;  . Tee without cardioversion N/A 03/08/2015    Procedure: TRANSESOPHAGEAL ECHOCARDIOGRAM (TEE);  Surgeon: [name], MD;  Location: [facility] ENDOSCOPY;  Service: Cardiovascular;  Laterality: N/A;     Current Outpatient Prescriptions  Medication Sig Dispense Refill  . aspirin 81 MG tablet Take 81 mg by mouth daily.    . Fish Oil-Cholecalciferol (FISH OIL + D3 PO) Take 1 capsule by mouth daily.    . metoprolol succinate (TOPROL-XL) 100 MG 24 hr tablet Take 100 mg by mouth daily. Take with or immediately following a meal.    . Multiple Vitamin (MULTIVITAMIN) tablet Take  1 tablet by mouth daily.      Marland Kitchen warfarin (COUMADIN) 5 MG tablet Take as directed by Anticoagulation Clinic (Patient taking differently: Take as directed by Anticoagulation Clinic Dose on 04/12/15: 5 mg daily except 2.5 mg on Tuesdays) 30 tablet 2   No current facility-administered medications for this visit.    Allergies:   Amiodarone hcl    Social History:  The patient  reports that she has never smoked. She has never used smokeless tobacco. She reports that she does not drink  alcohol or use illicit drugs.   Family History:  [name]    family history includes Alcohol abuse in her father; Lung cancer in her father; Other in her brother. There is no history of Colon cancer.    ROS:  Please see the history of present illness.   Otherwise, review of systems are positive for none.   All other systems are reviewed and negative.    PHYSICAL EXAM: VS:  BP 126/66 mmHg  Pulse 72  Ht 5\' 8"  (1.727 m)  Wt 150 lb 12.8 oz (68.402 kg)  BMI 22.93 kg/m2  SpO2 98% , BMI Body mass index is 22.93 kg/(m^2). GEN: Well nourished, well developed, in no acute distress Neck: no JVD, HJR, carotid bruits, or masses Cardiac: RRR; no murmurs,gallop, rubs, thrill or heave,  Respiratory:  clear to auscultation bilaterally, normal work of breathing GI: soft, nontender, nondistended, + BS MS: no deformity or atrophy Extremities: without cyanosis, clubbing, edema, good distal pulses bilaterally.  Skin: warm and dry, no rash Neuro:  Strength and sensation are intact    EKG:  EKG is not ordered today.    Recent Labs: 02/16/2015: ALT 17 02/18/2015: BUN 6; Creatinine, Ser 0.62; Hemoglobin 12.2; Magnesium 1.6*; Platelets 186; Potassium 3.8; Sodium 132*    Lipid Panel    Component Value Date/Time   CHOL 121 02/17/2015 0615   TRIG 68 02/19/2015 1450   HDL 30* 02/17/2015 0615   CHOLHDL 4.0 02/17/2015 0615   VLDL 24 02/17/2015 0615   LDLCALC 67 02/17/2015 0615      Wt Readings from Last 3 Encounters:  04/26/15 150 lb 12.8 oz (68.402 kg)  04/12/15 155 lb 1.6 oz (70.353 kg)  03/10/15 150 lb (68.04 kg)      Other studies Reviewed: Additional studies/ records that were reviewed today include and review of the records demonstrates: Study Conclusions  - Left ventricle: Systolic function was normal. The estimated   ejection fraction was in the range of 60% to 65%. Wall motion was   normal; there were no regional wall motion abnormalities. - Aortic valve: No evidence of  vegetation. There was no   regurgitation. - Mitral valve: No evidence of vegetation. There was mild   regurgitation. - Left atrium: Left atrial appendage is large with decreased   filling and emptying velocities. There is severe smoke/sludge and   a thrombus in the tip of the appendage measuring 21 x 12 mm. The   atrium was moderately dilated. - Right ventricle: The cavity size was normal. Wall thickness was   normal. Systolic function was normal. - Right atrium: No evidence of thrombus in the atrial cavity or   appendage. No evidence of thrombus in the atrial cavity or   appendage. - Atrial septum: There is septum secundum type ASD measuring 5 x 2   mm with left to right flow. - Tricuspid valve: There was mild regurgitation. - Pulmonic valve: Structurally normal valve. - Pulmonary arteries: The main  pulmonary artery was normal-sized. - Pericardium, extracardiac: There was no pericardial effusion.  Impressions:  - A large left atrial appendage with decreased velocities and an   evidence of a thrombus.     A small secundum type ASD with left to right flow is present.    ASSESSMENT AND PLAN: Thrombus of left atrial appendage  Patient had documented left atrial appendage thrombus on TEE and has been anticoagulated with Coumadin therapeutically for 6 weeks. She is scheduled for another TEE Wednesday at 1:30 by Dr. Meda Coffee to reassess clot. Follow-up with Dr. Burt Knack after.     [facility] has been requested to perform a transesophageal echocardiogram.  After careful review of history and examination, the risks and benefits of transesophageal echocardiogram have been explained including risks of esophageal damage, perforation (1:10,000 risk), bleeding, pharyngeal hematoma as well as other potential complications associated with conscious sedation including aspiration, arrhythmia, respiratory failure and death. Alternatives to treatment were discussed, questions were answered. Patient  is willing to proceed.   Ermalinda Barrios,  04/26/2015 3:20 PM    ASD (atrial septal defect)  Patient has small ASD. Followed by Dr. Burt Knack  Atrial fibrillation [facility])  Has been paroxysmal but not documented in several years.  Congenital factor VIII disorder (HCC)  Patient has hyper quite guilt both disorder with renal infarcts 2, TIA and recent stroke a full dose dabigitran..  Followed by Dr. Beryle Beams. Now on Coumadin.     Sumner Boast, PA-C  04/26/2015 2:33 PM    [facility] [address], [place], [place]  [address] Phone: [phone]; Fax: [phone]

## 2015-04-29 ENCOUNTER — Telehealth: Payer: Self-pay | Admitting: Cardiovascular Disease

## 2015-04-29 ENCOUNTER — Encounter (HOSPITAL_COMMUNITY): Payer: Self-pay | Admitting: Cardiology

## 2015-04-29 ENCOUNTER — Other Ambulatory Visit: Payer: Self-pay

## 2015-04-29 DIAGNOSIS — K649 Unspecified hemorrhoids: Secondary | ICD-10-CM | POA: Insufficient documentation

## 2015-04-29 NOTE — Assessment & Plan Note (Signed)
Discussed natural remedies for insomnia including herbal tea and melatonin.  Reviewd principles of good sleep hygiene 

## 2015-04-29 NOTE — Assessment & Plan Note (Signed)
Managed with prn H2 blocker.

## 2015-04-29 NOTE — Assessment & Plan Note (Signed)
Recommend use of colace to avoid irritation.  Refill on steroid cream discussed

## 2015-04-29 NOTE — Assessment & Plan Note (Signed)
Well controlled on current regimen. Renal function stable, no changes today.  Lab Results  Component Value Date   CREATININE 0.81 04/26/2015   Lab Results  Component Value Date   NA 135 04/26/2015   K 4.7 04/26/2015   CL 99 04/26/2015   CO2 25 04/26/2015

## 2015-04-29 NOTE — Patient Outreach (Addendum)
[place] [facility] [place] [facility]) Care Management  04/29/2015  Kendra [name] 12-11-1945 [id number]  [facility] Admission: 02/16/2015 - 02/19/2015 (CVA)  Providers: New local Primary MD: Dr. Derrel Nip - last appt 04/27/2015.  Neurologist: Dr. Leonie Man - 1st appt: 05/12/14 (Patient on wait list with Dr. Leonie Man for earlier appt if opportunity presents itself).   Radiologist:  Cardiologist: Dr. Burt Knack - last appt 03/09/14 Oncologist: Dr. Beryle Beams - Last appt 04/12/15 (Factor VIII evaluation)  HH:  H/o [facility] (PT, OT, ST) post stroke.  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  Mobility: Ambulating with no assistive devices post Stroke.  Falls: None  Pain: None  Transportation:Husband until MD releases patient.  Caregiver: Husband, Kendra [name] Advanced Directives: No. Husband elects NOT to have Advanced Directive due to his past experience as an attorney with the document being misunderstood. Husband and patient are well educated to the pros and cons of having this document and have made decision not to complete.  DME: Home BP cuff  THN conditions:  Admissions: 1 02/16/2015 - 02/19/2015 (CVA) ER visits: 1. H/o TRANSESOPHAGEAL ECHOCARDIOGRAM (TEE) completed 03/08/2015 confirming "clotting in the heart."  TRANSESOPHAGEAL ECHOCARDIOGRAM (TEE)  Completed 04/28/2015  Confirming improvement over 03/08/2015 results with LAA thrombus being smaller measuring 11 x 5 mm as compared to 22 x 12 mm on 03/08/2015. [facility] [employer] mailed 03/05/2015 (reviewed 03/11/2015).  -Recovering After Stroke -What You Can Do To Prevent A Second Stroke  MEDICATIONS:  Medications Reviewed Today    Reviewed by Kendra Brooking, RN (Registered Nurse) on 04/29/15 at 1008  Med List Status: <None>   Medication Order Taking? Sig Documenting Provider Last  Dose Status Informant   aspirin 81 MG tablet [id number] Yes Take 81 mg by mouth daily. Historical Provider, MD Taking Active Spouse/Significant Other   Fish Oil-Cholecalciferol (FISH OIL + D3 PO) [id number] Yes Take 1 capsule by mouth daily. Historical Provider, MD Taking Active Self   metoprolol succinate (TOPROL-XL) 100 MG 24 hr tablet [id number] Yes Take 100 mg by mouth daily. Take with or immediately following a meal. Historical Provider, MD Taking Active Self   Multiple Vitamin (MULTIVITAMIN) tablet [id number] Yes Take 1 tablet by mouth daily.   Historical Provider, MD Taking Active Spouse/Significant Other   warfarin (COUMADIN) 5 MG tablet [id number] Yes Take as directed by Anticoagulation Clinic  Patient taking differently:  Take as directed by Anticoagulation Clinic Dose on 04/12/15: 5 mg daily except 2.5 mg on Tuesdays   Sherren Mocha, MD Taking Active            Plan Referral Date: 02/25/2015 [facility] services 02/16/2015 - 04/29/2015 H/o patient is NOT eligible for [facility] ACO services. Case closed 04/29/2015.   Neurology appt.  RN CM confirmed communication with Dr. Leonie Man to request earlier appt.  RN CM advised that MD office most likely has not had earlier appt to open up to move patient into.  Patient has no further questions and reports progressive recovery and looking forward to driving again.   RN CM advised to please notify MD of any changes in condition prior to scheduled appt's.  RN CM confirmed patient is aware of 911 services for urgent emergency needs. RN CM notified [place] Management Assistant to close case: goals of care met.   [name], RN, BSN, [facility], CCM  [employer] [phone] Direct [phone] Cell  [phone] Office [phone] Fax

## 2015-04-29 NOTE — Assessment & Plan Note (Signed)
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  Continue coumadin. 

## 2015-04-29 NOTE — Assessment & Plan Note (Signed)
Managed with coumdin,  Possible Watchman's procedure anticipated.  Repeat TEE to be done this week

## 2015-04-29 NOTE — Telephone Encounter (Signed)
Will forward to CVRR to review.

## 2015-04-29 NOTE — Telephone Encounter (Signed)
New Message  Pt requested to speak w/ RN- wanted to make sure she was supposed to have coum check for 3/3- since having TEE. Please call back and discuss.

## 2015-04-29 NOTE — Telephone Encounter (Signed)
Returned pt's call. She had Watchman procedure done and is s/p TEE after 6 weeks of therapeutic Coumadin. TEE on 3/1 still showed LAA thrombus but smaller than the thrombus on TEE from 1/9. Advised pt that we will still check her Coumadin once more before her appt with Dr. Burt Knack. Will decide long term anticoag plan at visit with Dr. Burt Knack on 3/13. Pt verbalized understanding.

## 2015-05-04 ENCOUNTER — Ambulatory Visit (INDEPENDENT_AMBULATORY_CARE_PROVIDER_SITE_OTHER): Payer: 59 | Admitting: *Deleted

## 2015-05-04 DIAGNOSIS — I6350 Cerebral infarction due to unspecified occlusion or stenosis of unspecified cerebral artery: Secondary | ICD-10-CM | POA: Diagnosis not present

## 2015-05-04 DIAGNOSIS — Q2112 Patent foramen ovale: Secondary | ICD-10-CM

## 2015-05-04 DIAGNOSIS — I4892 Unspecified atrial flutter: Secondary | ICD-10-CM

## 2015-05-04 DIAGNOSIS — Z5181 Encounter for therapeutic drug level monitoring: Secondary | ICD-10-CM

## 2015-05-04 DIAGNOSIS — I480 Paroxysmal atrial fibrillation: Secondary | ICD-10-CM | POA: Diagnosis not present

## 2015-05-04 DIAGNOSIS — Q211 Atrial septal defect: Secondary | ICD-10-CM

## 2015-05-04 LAB — POCT INR: INR: 3.4

## 2015-05-10 ENCOUNTER — Ambulatory Visit (INDEPENDENT_AMBULATORY_CARE_PROVIDER_SITE_OTHER): Payer: 59 | Admitting: Pharmacist

## 2015-05-10 ENCOUNTER — Ambulatory Visit (INDEPENDENT_AMBULATORY_CARE_PROVIDER_SITE_OTHER): Payer: 59 | Admitting: Cardiovascular Disease

## 2015-05-10 ENCOUNTER — Encounter: Payer: Self-pay | Admitting: Cardiovascular Disease

## 2015-05-10 VITALS — BP 140/68 | HR 60 | Ht 67.5 in | Wt 152.6 lb

## 2015-05-10 DIAGNOSIS — I5189 Other ill-defined heart diseases: Secondary | ICD-10-CM

## 2015-05-10 DIAGNOSIS — Q211 Atrial septal defect: Secondary | ICD-10-CM

## 2015-05-10 DIAGNOSIS — I480 Paroxysmal atrial fibrillation: Secondary | ICD-10-CM | POA: Diagnosis not present

## 2015-05-10 DIAGNOSIS — Z5181 Encounter for therapeutic drug level monitoring: Secondary | ICD-10-CM

## 2015-05-10 DIAGNOSIS — I4892 Unspecified atrial flutter: Secondary | ICD-10-CM

## 2015-05-10 DIAGNOSIS — I6350 Cerebral infarction due to unspecified occlusion or stenosis of unspecified cerebral artery: Secondary | ICD-10-CM | POA: Diagnosis not present

## 2015-05-10 DIAGNOSIS — I513 Intracardiac thrombosis, not elsewhere classified: Secondary | ICD-10-CM

## 2015-05-10 DIAGNOSIS — Q2112 Patent foramen ovale: Secondary | ICD-10-CM

## 2015-05-10 LAB — POCT INR: INR: 3.8

## 2015-05-10 NOTE — Progress Notes (Signed)
Cardiology Office Note Date:  05/12/2015   ID:  STEFAN BLEHM, DOB Jul 26, 1945, MRN [id number]  PCP:  Crecencio Mc, MD  Cardiologist:  Sherren Mocha, MD    Chief Complaint  Patient presents with  . scheduled follow up    TEE. Denies any chest pain, shortness of breath, lee, claudication   History of Present Illness: Kendra [name] is a 70 y.o. female who presents for follow-up of ASD, recurrent stroke, congenital Factor VIII disorder, and known LAA thrombus. She has a history of multiple embolic events including recurrent renal infarctions, TIA, and stroke. She underwent TEE in January showing a large LAA thrombus despite an absence of documented atrial fibrillation in recent years. A follow-up study has now been completed after 6 weeks of therapeutic anticoagulation with warfarin goal INR 2.5-3.5 and ASA 81 mg daily. INR's are reviewed and have been therapeutic.   The patient is doing well. She's tolerating anticoagulation without bleeding problems. She denies headache, dizziness, chest pain, shortness of breath, or edema. Since I have seen her last, she has been evaluated by Dr Beryle Beams for formal hematology consultation and is seeing Dr Leonie Man back for neurology follow-up tomorrow.    Past Medical History  Diagnosis Date  . 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 Atrial flutter ([place])   . Atrial fibrillation (HCC)     chronic anticoag  . Arthus phenomenon   . Hypercholesterolemia     Pure  . GERD (gastroesophageal reflux disease)   . Kidney infarction [facility])   . TIA (transient ischemic attack)     as per 07/30/00 note from [place]   . Hx of blood clots   . Hemorrhoids   . 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 Stroke [facility])     Past Surgical History  Procedure Laterality Date  . Ablasion    . Cesarean section    . Breast biopsy Right   . Appendectomy    . Fibroid tumor removal  1996  . Cardiac electrophysiology study and ablation    . Radiology with anesthesia  N/A 02/16/2015    Procedure: RADIOLOGY WITH ANESTHESIA;  Surgeon: Medication Radiologist, MD;  Location: [place] NEURO ORS;  Service: Radiology;  Laterality: N/A;  . Tee without cardioversion N/A 03/08/2015    Procedure: TRANSESOPHAGEAL ECHOCARDIOGRAM (TEE);  Surgeon: [name], MD;  Location: [place];  Service: Cardiovascular;  Laterality: N/A;  . Tee without cardioversion N/A 04/28/2015    Procedure: TRANSESOPHAGEAL ECHOCARDIOGRAM (TEE);  Surgeon: [name], MD;  Location: [facility] ENDOSCOPY;  Service: Cardiovascular;  Laterality: N/A;    Current Outpatient Prescriptions  Medication Sig Dispense Refill  . aspirin 81 MG tablet Take 81 mg by mouth daily.    . Fish Oil-Cholecalciferol (FISH OIL + D3 PO) Take 1 capsule by mouth daily.    . metoprolol succinate (TOPROL-XL) 100 MG 24 hr tablet Take 100 mg by mouth daily. Take with or immediately following a meal.    . Multiple Vitamin (MULTIVITAMIN) tablet Take 1 tablet by mouth daily.      Marland Kitchen warfarin (COUMADIN) 5 MG tablet Take 5 mg by mouth as directed. Take by mouth as directed by the coumadin clinic     No current facility-administered medications for this visit.    Allergies:   Amiodarone hcl   Social History:  The patient  reports that she has never smoked. She has never used smokeless tobacco. She reports that she does not drink alcohol or use illicit drugs.  Family History:  [name] family history includes Alcohol abuse in her father; Lung cancer in her father; Other in her brother; Stroke in her maternal grandmother. There is no history of Colon cancer.    ROS:  Please see the history of present illness.  All other systems are reviewed and negative.    PHYSICAL EXAM: VS:  BP 140/68 mmHg  Pulse 60  Ht 5' 7.5" (1.715 m)  Wt 69.219 kg (152 lb 9.6 oz)  BMI 23.53 kg/m2 , BMI Body mass index is 23.53 kg/(m^2). GEN: Well nourished, well developed, in no acute distress HEENT: normal Neck: no JVD, no masses. No carotid  bruits Cardiac: RRR without murmur or gallop                Respiratory:  clear to auscultation bilaterally, normal work of breathing GI: soft, nontender MS: no deformity or atrophy Ext: no pretibial edem Skin: warm and dry, no rash Neuro:  Strength and sensation are intact Psych: euthymic mood, full affect  EKG:  EKG is not ordered today.  Recent Labs: 02/16/2015: ALT 17 02/18/2015: Hemoglobin 12.2; Magnesium 1.6*; Platelets 186 04/26/2015: BUN 14; Creat 0.81; Potassium 4.7; Sodium 135   Lipid Panel     Component Value Date/Time   CHOL 121 02/17/2015 0615   TRIG 68 02/19/2015 1450   HDL 30* 02/17/2015 0615   CHOLHDL 4.0 02/17/2015 0615   VLDL 24 02/17/2015 0615   LDLCALC 67 02/17/2015 0615      Wt Readings from Last 3 Encounters:  05/12/15 68.675 kg (151 lb 6.4 oz)  05/10/15 69.219 kg (152 lb 9.6 oz)  04/28/15 68.04 kg (150 lb)     Cardiac Studies Reviewed: TEE:  Study Conclusions  - - Left ventricle: Systolic function was normal. The estimated  ejection fraction was in the range of 60% to 65%. Wall motion was  normal; there were no regional wall motion abnormalities.  - Aortic valve: No evidence of vegetation. There was no  regurgitation.  - Mitral valve: No evidence of vegetation. There was mild  regurgitation.  - Left atrium: Left atrial appendage is large with significantly  decreased  filling and emptying velocities. There is significant smoke and  a small thrombus in the tip of the appendage measuring 11 x 5 mm.  The  atrium was moderately dilated.  - Right ventricle: The cavity size was normal. Wall thickness was  normal. Systolic function was normal.  - Right atrium: No evidence of thrombus in the atrial cavity or  appendage. No evidence of thrombus in the atrial cavity or  appendage.  - Atrial septum: There is a small septum secundum type ASD with  left to right flow.  - Tricuspid valve: There was mild regurgitation.  -  Pulmonic valve: Structurally normal valve.  - Pulmonary arteries: The main pulmonary artery was normal-sized.  - Pericardium, extracardiac: There was no pericardial effusion.  Impressions:  - - When compared to the prior TEE on 03/08/2015 LAA thrombus is now  smaller measuring 11 x 5 mm, previously 22 x 12 mm.   ASSESSMENT AND PLAN: 1.  Paroxysmal Atrial Fibrillation not recently documented 2. Thrombus of the left atrial appendage - decreased on most recent TEE study 3. Recurrent thromboembolic events with hx of renal infarcts and stroke 4. Secundum ASD (small), possibly related to previous transseptal procedures 5. Congenital Factor VIII Disorder with hypercoagulable state  Extensive data reviewed again, including recent hematology evaluation by Dr Beryle Beams and TEE images/report. Lengthy discussion with  the patient and her husband. At this point it seems most prudent to continue with warfarin (goal INR 2.5-3.5) and ASA 81 mg daily. I suspect she has no mechanical activity of her left atrium and will always be at risk of cardioembolic events. Her TEE images are reviewed, and while there is reduction in thrombus size, there remains significant stranding and smoke in the LAA. We discussed specific considerations around Watchman and ASD/PFO closure, but I don't think interventional therapies are indicated at present and in fact Watchman is contraindicated in the setting of LAA thrombus. They understand that Watchman has never been studied as adjunctive therapy to anticoagulation for patients who have had 'treatment failure' of anticoagulation. It would be reasonable to consider a repeat TEE but I would wait at least 6 months and even then I'm not sure it would impact her management. Will request records from [place] from her initial EP procedures to better understand findings at that time.   Current medicines are reviewed with the patient today.  The patient does not have concerns regarding  medicines.  Labs/ tests ordered today include:  No orders of the defined types were placed in this encounter.    Disposition:   FU 6 months with Dr Norina Buzzard, Sherren Mocha, MD  05/12/2015 11:41 PM    [address], [place], [place]  [address] Phone: [phone]; Fax: [phone]

## 2015-05-10 NOTE — Patient Instructions (Signed)
Medication Instructions:  Your physician recommends that you continue on your current medications as directed. Please refer to the Current Medication list given to you today.  Labwork: No new orders.   Testing/Procedures: No new orders.   Follow-Up: Your physician recommends that you schedule a follow-up appointment in: 3 MONTHS with Dr Lovena Le   Any Other Special Instructions Will Be Listed Below (If Applicable).  We will request a copy of your records from [place].      If you need a refill on your cardiac medications before your next appointment, please call your pharmacy.

## 2015-05-12 ENCOUNTER — Encounter: Payer: Self-pay | Admitting: Neurology

## 2015-05-12 ENCOUNTER — Ambulatory Visit (INDEPENDENT_AMBULATORY_CARE_PROVIDER_SITE_OTHER): Payer: 59 | Admitting: Neurology

## 2015-05-12 VITALS — BP 144/76 | HR 70 | Ht 67.0 in | Wt 151.4 lb

## 2015-05-12 DIAGNOSIS — Z8673 Personal history of transient ischemic attack (TIA), and cerebral infarction without residual deficits: Secondary | ICD-10-CM | POA: Insufficient documentation

## 2015-05-12 DIAGNOSIS — I6350 Cerebral infarction due to unspecified occlusion or stenosis of unspecified cerebral artery: Secondary | ICD-10-CM

## 2015-05-12 DIAGNOSIS — I63132 Cerebral infarction due to embolism of left carotid artery: Secondary | ICD-10-CM | POA: Diagnosis not present

## 2015-05-12 NOTE — Patient Instructions (Addendum)
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 I also advised the patient to eat a healthy diet with plenty of whole grains, cereals, fruits and vegetables, exercise regularly and maintain ideal body weight .She was considering watchman procedure  but feel she is not a good candidate as sheis likely to need long-term anticoagulation given her persistent left atrial clot as well as elevated factor VIII levels. Followup in the future with me in 6 months or call earlier if necessary Stroke Prevention Some medical conditions and behaviors are associated with an increased chance of having a stroke. You may prevent a stroke by making healthy choices and managing medical conditions. HOW CAN I REDUCE MY RISK OF HAVING A STROKE?   Stay physically active. Get at least 30 minutes of activity on most or all days.  Do not smoke. It may also be helpful to avoid exposure to secondhand smoke.  Limit alcohol use. Moderate alcohol use is considered to be:  No more than 2 drinks per day for men.  No more than 1 drink per day for nonpregnant women.  Eat healthy foods. This involves:  Eating 5 or more servings of fruits and vegetables a day.  Making dietary changes that address high blood pressure (hypertension), high cholesterol, diabetes, or obesity.  Manage your cholesterol levels.  Making food choices that are high in fiber and low in saturated fat, trans fat, and cholesterol may control cholesterol levels.  Take any  prescribed medicines to control cholesterol as directed by your health care provider.  Manage your diabetes.  Controlling your carbohydrate and sugar intake is recommended to manage diabetes.  Take any prescribed medicines to control diabetes as directed by your health care provider.  Control your hypertension.  Making food choices that are low in salt (sodium), saturated fat, trans fat, and cholesterol is recommended to manage hypertension.  Ask your health care provider if you need treatment to lower your blood pressure. Take any prescribed medicines to control hypertension as directed by your health care provider.  If you are 47-32 years of age, have your blood pressure checked every 3-5 years. If you are 23 years of age or older, have your blood pressure checked every year.  Maintain a healthy weight.  Reducing calorie intake and making food choices that are low in sodium, saturated fat, trans fat, and cholesterol are recommended to manage weight.  Stop drug abuse.  Avoid taking birth control pills.  Talk to your health care provider about the risks of taking birth control pills if you are over 52 years old, smoke, get migraines, or have ever had a blood clot.  Get evaluated for sleep disorders (sleep apnea).  Talk to your health care provider about getting a sleep evaluation if you snore a lot or have excessive sleepiness.  Take medicines only as directed by your health care provider.  For some people, aspirin or blood thinners (anticoagulants) are helpful in reducing the risk of forming abnormal blood clots that can lead to stroke. If you have the irregular heart rhythm of atrial fibrillation,  you should be on a blood thinner unless there is a good reason you cannot take them.  Understand all your medicine instructions.  Make sure that other conditions (such as anemia or atherosclerosis) are addressed. SEEK IMMEDIATE MEDICAL CARE IF:   You have sudden weakness or numbness  of the face, arm, or leg, especially on one side of the body.  Your face or eyelid droops to one side.  You have sudden confusion.  You have trouble speaking (aphasia) or understanding.  You have sudden trouble seeing in one or both eyes.  You have sudden trouble walking.  You have dizziness.  You have a loss of balance or coordination.  You have a sudden, severe headache with no known cause.  You have new chest pain or an irregular heartbeat. Any of these symptoms may represent a serious problem that is an emergency. Do not wait to see if the symptoms will go away. Get medical help at once. Call your local emergency services (911 in [place] Do not drive yourself to the hospital.   This information is not intended to replace advice given to you by your health care provider. Make sure you discuss any questions you have with your health care provider.   Document Released: 03/23/2004 Document Revised: 03/06/2014 Document Reviewed: 08/16/2012 Elsevier Interactive Patient Education [employer].

## 2015-05-12 NOTE — Progress Notes (Signed)
[facility] [address] [place]. [place] [address] [phone]       OFFICE FOLLOW-UP NOTE  [name] Date of Birth:  19-Nov-1945 Medical Record Number:  [id number]   HPI: 19 year Caucasian lady seen today for first office follow-up visit following hospital admission for stroke in December 2016. She is accompanied by her husband. Kendra [name] is an 70 y.o. female with a hx of factor 8 elevation and A flutter on pradaxa who was last seen normal at 11pm lon night of 02/15/2015 and woke up 02/16/2015 at 8am not speaking and moving her R side too well. CTHead showed dense L MCA sign and perfusion study showed significant mismatch. Patient was not administered TPA secondary to unknown time of onset. She was sent to IR under the wakeup stroke protocol where she received bilateral common carotid arteriograms followed by complete angiographicrevascularization of occluded LT MCA using x 1 pass with 37mm x 40 mm Solitaire retrieval device and approx 5 mg of superselective IA integrelin. TICI 3 revascularization She was admitted to the neuro ICU for further evaluation and treatment. Patient did well postprocedure and was extubated the next day. She was found to have mild expressive aphasia but no significant weakness. She was seen by physical occupational speech therapy and rehabilitation services and felt to possibly benefit from a short stay in inpatient rehabilitation but the patient was adamant and refused rehabilitation and wanted to go home and hen she was discharged from to the care of her family with plans for outpatient speech therapy. Patient was switched from Pradaxa for long-term anticoagulation to eliquis 5 mg twice daily after discussion with her cardiologist Dr. Crissie Sickles  Patient has continued to do well and she has been seen by .hematologist Dr. Murriel Hopper. as an outpatient who after review of her chart decided to switch her to warfarin for  anticoagulation and aspirin 81 mg in addition and stopped eliquis. She is tolerating it well without significant bleeding or bruising. She states her INR has been fairly steady. She states her blood pressure at home is quite good though today it is slightly elevated at 144/76 in office. She continues to have occasional word finding difficulties particularly when she is tired or tries to speak fast. She is otherwise quite independent in activities of daily living and is back to her baseline. She was seen by Dr. Sherren Mocha interventional cardiologist for consideration for PFO closure and watchman device but at the present time she is not a candidate for either. She had a repeat TEE done which showed improvement in her left atrial clot but not complete resolution yet. ROS:   14 system review of systems is positive for speech difficulty, word finding difficulty, cramps, runny nose and all other systems negative  PMH:  Past Medical History  Diagnosis Date  . 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 Atrial flutter ([place])   . Atrial fibrillation ([facility])     chronic anticoag  . Arthus phenomenon   . Hypercholesterolemia     Pure  . GERD (gastroesophageal reflux disease)   . Kidney infarction [facility])   . TIA (transient ischemic attack)     as per 07/30/00 note from [place]   . Hx of blood clots   . Hemorrhoids   . 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 Stroke [facility])     Social History:  Social History   Social History  . Marital Status: Married    Spouse Name:  N/A  . Number of Children: 4  . Years of Education: N/A   Occupational History  . 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 Smoking status: Never Smoker   . Smokeless tobacco: Never Used  . Alcohol Use: No  . Drug Use: No  . Sexual Activity: Not on file   Other Topics Concern  . Not on file   Social History Narrative   ** Merged History Encounter **       Lives with husband   No caffeine drinks      Medications:   Current Outpatient Prescriptions on File Prior to Visit  Medication Sig Dispense Refill  . aspirin 81 MG tablet Take 81 mg by mouth daily.    . Fish Oil-Cholecalciferol (FISH OIL + D3 PO) Take 1 capsule by mouth daily.    . metoprolol succinate (TOPROL-XL) 100 MG 24 hr tablet Take 100 mg by mouth daily. Take with or immediately following a meal.    . Multiple Vitamin (MULTIVITAMIN) tablet Take 1 tablet by mouth daily.      Marland Kitchen warfarin (COUMADIN) 5 MG tablet Take 5 mg by mouth as directed. Take by mouth as directed by the coumadin clinic     No current facility-administered medications on file prior to visit.    Allergies:   Allergies  Allergen Reactions  . Amiodarone Hcl Nausea Only and Rash    Physical Exam General: well developed, well nourished pleasant elderly Caucasian lady, seated, in no evident distress Head: head normocephalic and atraumatic.  Neck: supple with no carotid or supraclavicular bruits Cardiovascular: regular rate and rhythm, no murmurs Musculoskeletal: no deformity Skin:  no rash/petichiae Vascular:  Normal pulses all extremities Filed Vitals:   05/12/15 0925  BP: 144/76  Pulse: 70   Neurologic Exam Mental Status: Awake and fully alert. Oriented to place and time. Recent and remote memory intact. Attention span, concentration and fund of knowledge appropriate. Mood and affect appropriate. Occasional word finding difficulties but fluent speech with good comprehension and naming and repetition Cranial Nerves: Fundoscopic exam reveals sharp disc margins. Pupils equal, briskly reactive to light. Extraocular movements full without nystagmus. Visual fields full to confrontation. Hearing intact. Facial sensation intact. Face, tongue, palate moves normally and symmetrically.  Motor: Normal bulk and tone. Normal strength in all tested extremity muscles. Sensory.: intact to touch ,pinprick .position and vibratory sensation.  Coordination: Rapid  alternating movements normal in all extremities. Finger-to-nose and heel-to-shin performed accurately bilaterally. Gait and Station: Arises from chair without difficulty. Stance is normal. Gait demonstrates normal stride length and balance . Able to heel, toe and tandem walk without difficulty.  Reflexes: 1+ and symmetric. Toes downgoing.   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 I also advised the patient to eat a healthy diet with plenty of whole grains, cereals, fruits and vegetables, exercise regularly and maintain ideal body weight .She was considering watchman procedure  but feel she is not a good candidate as sheis likely to need long-term anticoagulation given her persistent left atrial clot as well as elevated factor VIII levels.Greater than 50% of time during this 25 minute visit was spent on counseling,explanation of diagnosis, planning of further management, discussion with patient and family and coordination of care  Followup in the future with me in 6 months or call  earlier if necessary Antony Contras, MD  Note: This document was prepared with digital dictation and possible smart phrase technology. Any transcriptional errors that result from this process are unintentional

## 2015-05-17 ENCOUNTER — Other Ambulatory Visit: Payer: Self-pay

## 2015-05-17 MED ORDER — HYDROCORTISONE 2.5 % RE CREA: 1.0000 "application " | TOPICAL_CREAM | Freq: Two times a day (BID) | RECTAL | Status: DC

## 2015-05-25 ENCOUNTER — Ambulatory Visit (INDEPENDENT_AMBULATORY_CARE_PROVIDER_SITE_OTHER): Payer: 59

## 2015-05-25 DIAGNOSIS — Z5181 Encounter for therapeutic drug level monitoring: Secondary | ICD-10-CM | POA: Diagnosis not present

## 2015-05-25 DIAGNOSIS — I4892 Unspecified atrial flutter: Secondary | ICD-10-CM

## 2015-05-25 DIAGNOSIS — I480 Paroxysmal atrial fibrillation: Secondary | ICD-10-CM | POA: Diagnosis not present

## 2015-05-25 DIAGNOSIS — I6350 Cerebral infarction due to unspecified occlusion or stenosis of unspecified cerebral artery: Secondary | ICD-10-CM | POA: Diagnosis not present

## 2015-05-25 DIAGNOSIS — Q211 Atrial septal defect: Secondary | ICD-10-CM

## 2015-05-25 DIAGNOSIS — Q2112 Patent foramen ovale: Secondary | ICD-10-CM

## 2015-05-25 LAB — POCT INR: INR: 3.8

## 2015-05-27 NOTE — Patient Outreach (Signed)
[facility]) Care Management  05/27/2015  Kendra [name] [birth year] [id number]   Per MD visit note on 05/12/15, physician obtained mRS. mRS = 2   [name], [place] Management Assistant

## 2015-06-03 ENCOUNTER — Other Ambulatory Visit: Payer: Self-pay | Admitting: Cardiovascular Disease

## 2015-06-07 ENCOUNTER — Ambulatory Visit (INDEPENDENT_AMBULATORY_CARE_PROVIDER_SITE_OTHER): Payer: 59

## 2015-06-07 DIAGNOSIS — Z5181 Encounter for therapeutic drug level monitoring: Secondary | ICD-10-CM

## 2015-06-07 DIAGNOSIS — Q2112 Patent foramen ovale: Secondary | ICD-10-CM

## 2015-06-07 DIAGNOSIS — I4892 Unspecified atrial flutter: Secondary | ICD-10-CM

## 2015-06-07 DIAGNOSIS — I480 Paroxysmal atrial fibrillation: Secondary | ICD-10-CM | POA: Diagnosis not present

## 2015-06-07 DIAGNOSIS — I6350 Cerebral infarction due to unspecified occlusion or stenosis of unspecified cerebral artery: Secondary | ICD-10-CM | POA: Diagnosis not present

## 2015-06-07 DIAGNOSIS — Q211 Atrial septal defect: Secondary | ICD-10-CM

## 2015-06-07 LAB — POCT INR: INR: 3.3

## 2015-06-28 ENCOUNTER — Ambulatory Visit (INDEPENDENT_AMBULATORY_CARE_PROVIDER_SITE_OTHER): Payer: 59 | Admitting: *Deleted

## 2015-06-28 ENCOUNTER — Telehealth: Payer: Self-pay | Admitting: Cardiovascular Disease

## 2015-06-28 DIAGNOSIS — Q211 Atrial septal defect: Secondary | ICD-10-CM

## 2015-06-28 DIAGNOSIS — Z5181 Encounter for therapeutic drug level monitoring: Secondary | ICD-10-CM | POA: Diagnosis not present

## 2015-06-28 DIAGNOSIS — I480 Paroxysmal atrial fibrillation: Secondary | ICD-10-CM | POA: Diagnosis not present

## 2015-06-28 DIAGNOSIS — I6350 Cerebral infarction due to unspecified occlusion or stenosis of unspecified cerebral artery: Secondary | ICD-10-CM

## 2015-06-28 DIAGNOSIS — Q2112 Patent foramen ovale: Secondary | ICD-10-CM

## 2015-06-28 DIAGNOSIS — I4892 Unspecified atrial flutter: Secondary | ICD-10-CM

## 2015-06-28 LAB — POCT INR: INR: 3.7

## 2015-06-28 NOTE — Telephone Encounter (Signed)
New Message  Pt following up from [facility]- discussed possible 2nd opinion- wanted to also verify that Dr cooper received med rec from [place]. Please call back and discuss.

## 2015-06-29 NOTE — Telephone Encounter (Signed)
Follow up   Pt verbalized she is returning rn call

## 2015-06-29 NOTE — Telephone Encounter (Signed)
Left message on machine for pt to contact the office.  Records received from [facility] today.

## 2015-06-29 NOTE — Telephone Encounter (Signed)
I spoke with the pt and she wanted to follow-up with Dr Burt Knack in regards to her discussion at most recent office visit in regards to a second opinion.  I made her aware that I did not see any specific documentation and that I would need to speak with Dr Burt Knack and then contact her again with information. I also reviewed ablation note from [place] and there was no specific documentation in regards to the pt's left atrial appendage. I will forward this information to Dr Burt Knack.

## 2015-06-30 ENCOUNTER — Encounter: Payer: Self-pay | Admitting: Nurse Practitioner

## 2015-07-19 ENCOUNTER — Ambulatory Visit (INDEPENDENT_AMBULATORY_CARE_PROVIDER_SITE_OTHER): Payer: 59 | Admitting: *Deleted

## 2015-07-19 DIAGNOSIS — I6350 Cerebral infarction due to unspecified occlusion or stenosis of unspecified cerebral artery: Secondary | ICD-10-CM

## 2015-07-19 DIAGNOSIS — Z5181 Encounter for therapeutic drug level monitoring: Secondary | ICD-10-CM | POA: Diagnosis not present

## 2015-07-19 DIAGNOSIS — I480 Paroxysmal atrial fibrillation: Secondary | ICD-10-CM | POA: Diagnosis not present

## 2015-07-19 DIAGNOSIS — I4892 Unspecified atrial flutter: Secondary | ICD-10-CM

## 2015-07-19 DIAGNOSIS — Q2112 Patent foramen ovale: Secondary | ICD-10-CM

## 2015-07-19 DIAGNOSIS — Q211 Atrial septal defect: Secondary | ICD-10-CM

## 2015-07-19 LAB — POCT INR: INR: 3.3

## 2015-07-29 ENCOUNTER — Encounter: Payer: Self-pay | Admitting: Internal Medicine

## 2015-08-10 ENCOUNTER — Ambulatory Visit (INDEPENDENT_AMBULATORY_CARE_PROVIDER_SITE_OTHER): Payer: 59 | Admitting: Internal Medicine

## 2015-08-10 ENCOUNTER — Ambulatory Visit (INDEPENDENT_AMBULATORY_CARE_PROVIDER_SITE_OTHER): Payer: 59 | Admitting: Pharmacist

## 2015-08-10 ENCOUNTER — Encounter: Payer: Self-pay | Admitting: Internal Medicine

## 2015-08-10 VITALS — BP 140/82 | HR 66 | Ht 67.5 in | Wt 146.8 lb

## 2015-08-10 DIAGNOSIS — I119 Hypertensive heart disease without heart failure: Secondary | ICD-10-CM | POA: Diagnosis not present

## 2015-08-10 DIAGNOSIS — I6350 Cerebral infarction due to unspecified occlusion or stenosis of unspecified cerebral artery: Secondary | ICD-10-CM

## 2015-08-10 DIAGNOSIS — Q2112 Patent foramen ovale: Secondary | ICD-10-CM

## 2015-08-10 DIAGNOSIS — I480 Paroxysmal atrial fibrillation: Secondary | ICD-10-CM

## 2015-08-10 DIAGNOSIS — Z5181 Encounter for therapeutic drug level monitoring: Secondary | ICD-10-CM

## 2015-08-10 DIAGNOSIS — Q211 Atrial septal defect: Secondary | ICD-10-CM | POA: Diagnosis not present

## 2015-08-10 DIAGNOSIS — I4892 Unspecified atrial flutter: Secondary | ICD-10-CM

## 2015-08-10 LAB — POCT INR: INR: 4

## 2015-08-10 MED ORDER — METOPROLOL SUCCINATE ER 100 MG PO TB24: 100.0000 mg | ORAL_TABLET | Freq: Every day | ORAL | Status: DC

## 2015-08-10 MED ORDER — AMLODIPINE BESYLATE 5 MG PO TABS: 5.0000 mg | ORAL_TABLET | Freq: Every day | ORAL | Status: DC

## 2015-08-10 MED ORDER — WARFARIN SODIUM 5 MG PO TABS: ORAL_TABLET | ORAL | Status: DC

## 2015-08-10 NOTE — Progress Notes (Signed)
HPI Mrs. Sessler returns today for followup. She is a pleasant 70 yo woman with a hypercoaguable state with multiple embolic events including a stroke 6 months ago. She has a long h/o atrial arrhythmias which have been quiet clinically for many years. 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 She has noted worsening HTN. She was found to have a persistence of LAA thrombus on 2 different TEE's in the past few months. After much reflection and consultation by interventional cardiology, neurology and hematology, it was thought that her best option was using coumadin with a higher INR, and low dose ASA. The patient has almost a complete recovery from her stroke. She is considering a consultation with Dr. Bridgette Habermann in [place]. She is walking serveral miles a day. Allergies  Allergen Reactions  . Amiodarone Hcl Nausea Only and Rash     Current Outpatient Prescriptions  Medication Sig Dispense Refill  . amLODipine (NORVASC) 5 MG tablet Take 1 tablet (5 mg total) by mouth daily. 90 tablet 3  . aspirin 81 MG tablet Take 81 mg by mouth daily.    . Fish Oil-Cholecalciferol (FISH OIL + D3 PO) Take 1 capsule by mouth daily.    . hydrocortisone (PROCTOSOL HC) 2.5 % rectal cream Place 1 application rectally 2 (two) times daily. 30 g 3  . metoprolol succinate (TOPROL-XL) 100 MG 24 hr tablet Take 1 tablet (100 mg total) by mouth daily. Take with or immediately following a meal. 30 tablet 11  . Multiple Vitamin (MULTIVITAMIN) tablet Take 1 tablet by mouth daily.      Marland Kitchen warfarin (COUMADIN) 5 MG tablet Take as directed by the Coumadin Clinic. 35 tablet 3   No current facility-administered medications for this visit.     Past Medical History  Diagnosis Date  . 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 Atrial flutter ([place])   . Atrial fibrillation ([place])   . Arthus phenomenon   . Hypercholesterolemia   . GERD (gastroesophageal reflux disease)   .  Kidney infarction [facility])   . TIA (transient ischemic attack)     as per 07/30/00 note from [place]   . Hx of blood clots   . Hemorrhoids   . 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 Stroke ([place])     ROS:   All systems reviewed and negative except as noted in the HPI.   Past Surgical History  Procedure Laterality Date  . Ablasion    . Cesarean section    . Breast biopsy Right   . Appendectomy    . Fibroid tumor removal  1996  . Cardiac electrophysiology study and ablation    . Radiology with anesthesia N/A 02/16/2015    Procedure: RADIOLOGY WITH ANESTHESIA;  Surgeon: Medication Radiologist, MD;  Location: [facility];  Service: Radiology;  Laterality: N/A;  . Tee without cardioversion N/A 03/08/2015    Procedure: TRANSESOPHAGEAL ECHOCARDIOGRAM (TEE);  Surgeon: [name], MD;  Location: [facility];  Service: Cardiovascular;  Laterality: N/A;  . Tee without cardioversion N/A 04/28/2015    Procedure: TRANSESOPHAGEAL ECHOCARDIOGRAM (TEE);  Surgeon: [name], MD;  [facility];  Service: Cardiovascular;  Laterality: N/A;     Family History  Problem Relation Age of Onset  . Alcohol abuse Father   . Lung cancer Father   . Colon cancer Neg Hx   . Other Brother     Emotional Illness  .  Stroke Maternal Grandmother      Social History   Social History  . Marital Status: Married    Spouse Name: N/A  . Number of Children: 4  . Years of Education: N/A   Occupational History  . 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 Smoking status: Never Smoker   . Smokeless tobacco: Never Used  . Alcohol Use: No  . Drug Use: No  . Sexual Activity: Not on file   Other Topics Concern  . Not on file   Social History Narrative   ** Merged History Encounter **       Lives with husband   No caffeine drinks      BP 140/82 mmHg  Pulse 66  Ht 5' 7.5" (1.715 m)  Wt 146 lb 12.8 oz (66.588 kg)  BMI 22.64 kg/m2  Physical Exam:  Well  appearing NAD HEENT: Unremarkable Neck:  No JVD, no thyromegally Lymphatics:  No adenopathy Back:  No CVA tenderness Lungs:  Clear HEART:  Regular rate rhythm, no murmurs, no rubs, no clicks Abd:  soft, positive bowel sounds, no organomegally, no rebound, no guarding Ext:  2 plus pulses, no edema, no cyanosis, no clubbing Skin:  No rashes no nodules Neuro:  CN II through XII intact, motor grossly intact  EKG - NSR   Assess/Plan: 1. Atrial fib - she has had no symptoms in nearly 10 years. She will continue his current meds. 2. HTN - her blood pressure is not well controlled. I will add amlodipine.  3. Dyslipidemia - she will continue her statin therapy.   Mikle Bosworth.D.

## 2015-08-10 NOTE — Patient Instructions (Signed)
Medication Instructions:  Your physician has recommended you make the following change in your medication:  1) Start Amlodipine 5 mg daily   Labwork: None ordered   Testing/Procedures: None ordered   Follow-Up: Your physician recommends that you schedule a follow-up appointment in: 3 months with Dr Lovena Le   Any Other Special Instructions Will Be Listed Below (If Applicable).     If you need a refill on your cardiac medications before your next appointment, please call your pharmacy.

## 2015-09-03 ENCOUNTER — Ambulatory Visit (INDEPENDENT_AMBULATORY_CARE_PROVIDER_SITE_OTHER): Payer: 59 | Admitting: Pharmacist

## 2015-09-03 DIAGNOSIS — I6350 Cerebral infarction due to unspecified occlusion or stenosis of unspecified cerebral artery: Secondary | ICD-10-CM

## 2015-09-03 DIAGNOSIS — Z5181 Encounter for therapeutic drug level monitoring: Secondary | ICD-10-CM

## 2015-09-03 DIAGNOSIS — I480 Paroxysmal atrial fibrillation: Secondary | ICD-10-CM

## 2015-09-03 DIAGNOSIS — Q2112 Patent foramen ovale: Secondary | ICD-10-CM

## 2015-09-03 DIAGNOSIS — Q211 Atrial septal defect: Secondary | ICD-10-CM

## 2015-09-03 DIAGNOSIS — I4892 Unspecified atrial flutter: Secondary | ICD-10-CM

## 2015-09-03 LAB — POCT INR: INR: 3.9

## 2015-09-06 ENCOUNTER — Encounter: Payer: Self-pay | Admitting: Internal Medicine

## 2015-09-17 ENCOUNTER — Ambulatory Visit (INDEPENDENT_AMBULATORY_CARE_PROVIDER_SITE_OTHER): Payer: 59 | Admitting: Cardiology

## 2015-09-17 DIAGNOSIS — I6350 Cerebral infarction due to unspecified occlusion or stenosis of unspecified cerebral artery: Secondary | ICD-10-CM

## 2015-09-17 DIAGNOSIS — Q2112 Patent foramen ovale: Secondary | ICD-10-CM

## 2015-09-17 DIAGNOSIS — I480 Paroxysmal atrial fibrillation: Secondary | ICD-10-CM

## 2015-09-17 DIAGNOSIS — Q211 Atrial septal defect: Secondary | ICD-10-CM

## 2015-09-17 DIAGNOSIS — I4892 Unspecified atrial flutter: Secondary | ICD-10-CM

## 2015-09-17 DIAGNOSIS — Z5181 Encounter for therapeutic drug level monitoring: Secondary | ICD-10-CM

## 2015-09-17 LAB — PROTIME-INR: INR: 3 — AB (ref 0.9–1.1)

## 2015-10-01 ENCOUNTER — Ambulatory Visit (INDEPENDENT_AMBULATORY_CARE_PROVIDER_SITE_OTHER): Payer: 59 | Admitting: Pharmacist

## 2015-10-01 DIAGNOSIS — I480 Paroxysmal atrial fibrillation: Secondary | ICD-10-CM

## 2015-10-01 DIAGNOSIS — I4892 Unspecified atrial flutter: Secondary | ICD-10-CM

## 2015-10-01 DIAGNOSIS — Q211 Atrial septal defect: Secondary | ICD-10-CM

## 2015-10-01 DIAGNOSIS — Z5181 Encounter for therapeutic drug level monitoring: Secondary | ICD-10-CM

## 2015-10-01 DIAGNOSIS — I6350 Cerebral infarction due to unspecified occlusion or stenosis of unspecified cerebral artery: Secondary | ICD-10-CM

## 2015-10-01 DIAGNOSIS — Q2112 Patent foramen ovale: Secondary | ICD-10-CM

## 2015-10-01 LAB — POCT INR: INR: 2.8

## 2015-10-12 ENCOUNTER — Encounter: Payer: Self-pay | Admitting: Internal Medicine

## 2015-10-13 ENCOUNTER — Telehealth: Payer: Self-pay | Admitting: Internal Medicine

## 2015-10-13 NOTE — Telephone Encounter (Signed)
Kendra [name] is calling to find out if the records has been sent to Dr. Jannette Spanner in [place] . States that Dr. Lovena Le recommended him to her . Please call

## 2015-10-13 NOTE — Telephone Encounter (Signed)
Cardiac records faxed to [place] w/ [facility] fax-[phone].

## 2015-10-14 ENCOUNTER — Telehealth: Payer: Self-pay | Admitting: Internal Medicine

## 2015-10-14 NOTE — Telephone Encounter (Signed)
Spoke with patient and let her know I have faxed all her records to [place].  She will let me know if anything else is needed

## 2015-10-14 NOTE — Telephone Encounter (Signed)
Follow Up: ° ° ° ° ° °Pt says she is returning your call. °

## 2015-10-14 NOTE — Telephone Encounter (Signed)
New Message  Pt call requesting to speak with RN. Speak ask if she was going to have to sign any paperwork for her medical records. Pt ask to speak with Claiborne Billings about information. Please call back to discuss

## 2015-10-18 ENCOUNTER — Encounter: Payer: Self-pay | Admitting: Internal Medicine

## 2015-10-18 ENCOUNTER — Telehealth: Payer: Self-pay | Admitting: Internal Medicine

## 2015-10-18 NOTE — Telephone Encounter (Signed)
Kendra [name] is calling about information being sent to a provider in [place] for a second opinion . The office is telling her that they have not received the medical records as of yet . Please call

## 2015-10-18 NOTE — Telephone Encounter (Signed)
Pt call to change call back information

## 2015-10-19 ENCOUNTER — Encounter: Payer: Self-pay | Admitting: Internal Medicine

## 2015-10-22 ENCOUNTER — Ambulatory Visit (INDEPENDENT_AMBULATORY_CARE_PROVIDER_SITE_OTHER): Payer: 59 | Admitting: Internal Medicine

## 2015-10-22 DIAGNOSIS — Q2112 Patent foramen ovale: Secondary | ICD-10-CM

## 2015-10-22 DIAGNOSIS — I4892 Unspecified atrial flutter: Secondary | ICD-10-CM

## 2015-10-22 DIAGNOSIS — I6350 Cerebral infarction due to unspecified occlusion or stenosis of unspecified cerebral artery: Secondary | ICD-10-CM

## 2015-10-22 DIAGNOSIS — Z5181 Encounter for therapeutic drug level monitoring: Secondary | ICD-10-CM

## 2015-10-22 DIAGNOSIS — Q211 Atrial septal defect: Secondary | ICD-10-CM

## 2015-10-22 DIAGNOSIS — I480 Paroxysmal atrial fibrillation: Secondary | ICD-10-CM

## 2015-10-22 LAB — POCT INR: INR: 2.5

## 2015-11-10 ENCOUNTER — Ambulatory Visit: Payer: 59 | Admitting: Neurology

## 2015-11-19 ENCOUNTER — Telehealth: Payer: Self-pay | Admitting: *Deleted

## 2015-11-19 NOTE — Telephone Encounter (Signed)
Pt calls to state her INR today at her PCP in [facility] was 5.7. He ordered and pt took a 5mg s of Vitamin K tablet. Dr Davis(PCP) also instructed her to take no coumadin today or tomorrow and only take 1/2 tablet on Sunday and RTC on Monday or recheck. Thus, told pt when Dr Rosana Hoes is no longer managing her coumadin therapy let us know. She states she is due to return to [place] early November.

## 2015-11-26 ENCOUNTER — Ambulatory Visit (INDEPENDENT_AMBULATORY_CARE_PROVIDER_SITE_OTHER): Payer: 59 | Admitting: *Deleted

## 2015-11-26 DIAGNOSIS — I6350 Cerebral infarction due to unspecified occlusion or stenosis of unspecified cerebral artery: Secondary | ICD-10-CM | POA: Diagnosis not present

## 2015-11-26 DIAGNOSIS — I480 Paroxysmal atrial fibrillation: Secondary | ICD-10-CM

## 2015-11-26 DIAGNOSIS — Q211 Atrial septal defect: Secondary | ICD-10-CM

## 2015-11-26 DIAGNOSIS — Q2112 Patent foramen ovale: Secondary | ICD-10-CM

## 2015-11-26 DIAGNOSIS — I4892 Unspecified atrial flutter: Secondary | ICD-10-CM

## 2015-11-26 DIAGNOSIS — Z5181 Encounter for therapeutic drug level monitoring: Secondary | ICD-10-CM

## 2015-11-26 LAB — POCT INR: INR: 2.9

## 2015-12-06 ENCOUNTER — Ambulatory Visit (INDEPENDENT_AMBULATORY_CARE_PROVIDER_SITE_OTHER): Payer: Managed Care, Other (non HMO) | Admitting: Neurology

## 2015-12-06 ENCOUNTER — Telehealth: Payer: Self-pay | Admitting: Neurology

## 2015-12-06 ENCOUNTER — Encounter: Payer: Self-pay | Admitting: Neurology

## 2015-12-06 ENCOUNTER — Ambulatory Visit (INDEPENDENT_AMBULATORY_CARE_PROVIDER_SITE_OTHER): Payer: Managed Care, Other (non HMO) | Admitting: *Deleted

## 2015-12-06 VITALS — BP 112/66 | HR 64 | Wt 143.0 lb

## 2015-12-06 DIAGNOSIS — I480 Paroxysmal atrial fibrillation: Secondary | ICD-10-CM

## 2015-12-06 DIAGNOSIS — I4892 Unspecified atrial flutter: Secondary | ICD-10-CM

## 2015-12-06 DIAGNOSIS — I6529 Occlusion and stenosis of unspecified carotid artery: Secondary | ICD-10-CM

## 2015-12-06 DIAGNOSIS — I6350 Cerebral infarction due to unspecified occlusion or stenosis of unspecified cerebral artery: Secondary | ICD-10-CM

## 2015-12-06 DIAGNOSIS — Q2112 Patent foramen ovale: Secondary | ICD-10-CM

## 2015-12-06 DIAGNOSIS — Z5181 Encounter for therapeutic drug level monitoring: Secondary | ICD-10-CM

## 2015-12-06 DIAGNOSIS — Q211 Atrial septal defect: Secondary | ICD-10-CM | POA: Diagnosis not present

## 2015-12-06 LAB — POCT INR: INR: 3.3

## 2015-12-06 NOTE — Patient Instructions (Signed)
I had a long discussion with the patient and husband regarding her remote stroke, risk for recurrent strokes and answered questions. Continue warfarin for her primary hypercoagulable disorder and stroke prevention and close monitoring of INR with target goal of between 2 and 3. Strict control of hypertension with blood pressure goal below 130/90. Check follow-up carotid and transcranial Doppler studies. Return for follow-up in the future only as necessary and no routine appointment was made.

## 2015-12-06 NOTE — Telephone Encounter (Signed)
Hi Kendra [name] ready to be scheduled No PA needed. Thanks Hinton Dyer

## 2015-12-06 NOTE — Progress Notes (Addendum)
[facility] [address] [place]. [place] [address] [phone]       OFFICE FOLLOW-UP NOTE  [name] Date of Birth:  [birth year] Medical Record Number:  [id number]   HPI: 4 year Caucasian lady seen today for first office follow-up visit following hospital admission for stroke in December 2016. She is accompanied by her husband. Kendra [name] is an 70 y.o. female with a hx of factor 8 elevation and A flutter on pradaxa who was last seen normal at 11pm lon night of 02/15/2015 and woke up 02/16/2015 at 8am not speaking and moving her R side too well. CTHead showed dense L MCA sign and perfusion study showed significant mismatch. Patient was not administered TPA secondary to unknown time of onset. She was sent to IR under the wakeup stroke protocol where she received bilateral common carotid arteriograms followed by complete angiographicrevascularization of occluded LT MCA using x 1 pass with 66mm x 40 mm Solitaire retrieval device and approx 5 mg of superselective IA integrelin. TICI 3 revascularization She was admitted to the neuro ICU for further evaluation and treatment. Patient did well postprocedure and was extubated the next day. She was found to have mild expressive aphasia but no significant weakness. She was seen by physical occupational speech therapy and rehabilitation services and felt to possibly benefit from a short stay in inpatient rehabilitation but the patient was adamant and refused rehabilitation and wanted to go home and hen she was discharged from to the care of her family with plans for outpatient speech therapy. Patient was switched from Pradaxa for long-term anticoagulation to eliquis 5 mg twice daily after discussion with her cardiologist Dr. Crissie Sickles  Patient has continued to do well and she has been seen by .hematologist Dr. Murriel Hopper. as an outpatient who after review of her chart decided to switch her to warfarin for  anticoagulation and aspirin 81 mg in addition and stopped eliquis. She is tolerating it well without significant bleeding or bruising. She states her INR has been fairly steady. She states her blood pressure at home is quite good though today it is slightly elevated at 144/76 in office. She continues to have occasional word finding difficulties particularly when she is tired or tries to speak fast. She is otherwise quite independent in activities of daily living and is back to her baseline. She was seen by Dr. Sherren Mocha interventional cardiologist for consideration for PFO closure and watchman device but at the present time she is not a candidate for either. She had a repeat TEE done which showed improvement in her left atrial clot but not complete resolution yet. Update 12/06/2015 : She returns for follow-up after last visit 6 months ago. She continues to do well without recurrent stroke or TIA symptoms. She remains on warfarin but INR continues to fluctuate. Last month she wasn't going walk when INR was elevated at 5.4 and was reversed with vitamin K. She did not have any bleeding or bruising at that time. She states her blood pressure is well controlled on Norvasc and today it is 112/66. She is fully independent and has no complaints. She did see Dr. Sabino Snipes in [place] for a second opinion for watchman device and he also agreed she was not a good candidate. ROS:   14 system review of systems is positive for  appetite change, easy bruising, insomnia and all other systems negative  PMH:  Past Medical History:  Diagnosis Date  . Arthus phenomenon   .  Atrial fibrillation ([place])   . Atrial flutter ([place])   . 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 GERD (gastroesophageal reflux disease)   . Hemorrhoids   . Hx of blood clots   . Hypercholesterolemia   . Kidney infarction [facility])   . Stroke ([place])   . TIA (transient ischemic attack)    as per 07/30/00  note from [facility] History:  Social History   Social History  . Marital status: Married    Spouse name: N/A  . Number of children: 4  . Years of education: N/A   Occupational History  . 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 Smoking status: Never Smoker  . Smokeless tobacco: Never Used  . Alcohol use No  . Drug use: No  . Sexual activity: Not on file   Other Topics Concern  . Not on file   Social History Narrative   ** Merged History Encounter **       Lives with husband   No caffeine drinks     Medications:   Current Outpatient Prescriptions on File Prior to Visit  Medication Sig Dispense Refill  . amLODipine (NORVASC) 5 MG tablet Take 1 tablet (5 mg total) by mouth daily. 90 tablet 3  . aspirin 81 MG tablet Take 81 mg by mouth daily.    . Fish Oil-Cholecalciferol (FISH OIL + D3 PO) Take 1 capsule by mouth daily.    . hydrocortisone (PROCTOSOL HC) 2.5 % rectal cream Place 1 application rectally 2 (two) times daily. 30 g 3  . metoprolol succinate (TOPROL-XL) 100 MG 24 hr tablet Take 1 tablet (100 mg total) by mouth daily. Take with or immediately following a meal. 30 tablet 11  . Multiple Vitamin (MULTIVITAMIN) tablet Take 1 tablet by mouth daily.      Marland Kitchen warfarin (COUMADIN) 5 MG tablet Take as directed by the Coumadin Clinic. 35 tablet 3   No current facility-administered medications on file prior to visit.     Allergies:   Allergies  Allergen Reactions  . Amiodarone Hcl Nausea Only and Rash    Physical Exam General: well developed, well nourished pleasant elderly Caucasian lady, seated, in no evident distress Head: head normocephalic and atraumatic.  Neck: supple with no carotid or supraclavicular bruits Cardiovascular: regular rate and rhythm, no murmurs Musculoskeletal: no deformity Skin:  no rash/petichiae Vascular:  Normal pulses all extremities Vitals:   12/06/15 0942  BP: 112/66  Pulse: 64   Neurologic Exam Mental  Status: Awake and fully alert. Oriented to place and time. Recent and remote memory intact. Attention span, concentration and fund of knowledge appropriate. Mood and affect appropriate. Occasional word finding difficulties but fluent speech with good comprehension and naming and repetition Cranial Nerves: Fundoscopic exam reveals sharp disc margins. Pupils equal, briskly reactive to light. Extraocular movements full without nystagmus. Visual fields full to confrontation. Hearing intact. Facial sensation intact. Face, tongue, palate moves normally and symmetrically.  Motor: Normal bulk and tone. Normal strength in all tested extremity muscles. Sensory.: intact to touch ,pinprick .position and vibratory sensation.  Coordination: Rapid alternating movements normal in all extremities. Finger-to-nose and heel-to-shin performed accurately bilaterally. Gait and Station: Arises from chair without difficulty. Stance is normal. Gait demonstrates normal stride length and balance . Able to heel, toe and tandem walk without difficulty.  Reflexes: 1+ and symmetric. Toes downgoing.   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 Continue warfarin for her primary hypercoagulable disorder and stroke prevention and close monitoring of INR with target goal of between 2 and 3. Strict control of hypertension with blood pressure goal below 130/90. Check follow-up carotid and transcranial Doppler studies. Return  for follow-up in the future only as necessary and no routine appointment was made.. I also again reiterated to the patient and her husband that she is not a good candidate for watchman device as she needs lifelong anticoagulation due to her elevated factor VIII levels. Greater than 50% of time during this 25 minute visit was spent on counseling,explanation of diagnosis, planning of further management, discussion with patient and family and coordination of care   Antony Contras, MD ADDENDUM : After discussion with Dr. Crissie Sickles will change patient's INR goal recommendations to 2.5-3.5 range  Antony Contras, MD Note: This document was prepared with digital dictation and possible smart phrase technology. Any transcriptional errors that result from this process are unintentional

## 2015-12-08 ENCOUNTER — Encounter: Payer: Self-pay | Admitting: Internal Medicine

## 2015-12-14 ENCOUNTER — Encounter: Payer: Self-pay | Admitting: Oncology

## 2015-12-14 ENCOUNTER — Telehealth: Payer: Self-pay | Admitting: *Deleted

## 2015-12-14 ENCOUNTER — Ambulatory Visit (INDEPENDENT_AMBULATORY_CARE_PROVIDER_SITE_OTHER): Payer: Managed Care, Other (non HMO) | Admitting: Oncology

## 2015-12-14 VITALS — BP 137/77 | HR 68 | Temp 97.7°F | Ht 67.5 in | Wt 147.4 lb

## 2015-12-14 DIAGNOSIS — D66 Hereditary factor VIII deficiency: Secondary | ICD-10-CM

## 2015-12-14 DIAGNOSIS — Z7901 Long term (current) use of anticoagulants: Secondary | ICD-10-CM

## 2015-12-14 DIAGNOSIS — I480 Paroxysmal atrial fibrillation: Secondary | ICD-10-CM

## 2015-12-14 DIAGNOSIS — Z8673 Personal history of transient ischemic attack (TIA), and cerebral infarction without residual deficits: Secondary | ICD-10-CM | POA: Diagnosis not present

## 2015-12-14 DIAGNOSIS — I4892 Unspecified atrial flutter: Secondary | ICD-10-CM | POA: Diagnosis not present

## 2015-12-14 DIAGNOSIS — I63132 Cerebral infarction due to embolism of left carotid artery: Secondary | ICD-10-CM

## 2015-12-14 HISTORY — DX: Long term (current) use of anticoagulants: Z79.01

## 2015-12-14 NOTE — Telephone Encounter (Signed)
-----   Message from Barkley Boards, RN sent at 12/14/2015  4:19 PM EDT ----- Regarding: FW: INR range Please see INR range per Dr Lovena Le ----- Message ----- From: Evans Lance, MD Sent: 12/14/2015   3:20 PM To: Barkley Boards, RN Subject: RE: INR range                                  2.5 - 3.5 is the therapeutic goal. GT ----- Message ----- From: Barkley Boards, RN Sent: 12/10/2015  10:38 AM To: Iven Finn, LPN, Evans Lance, MD Subject: INR range                                      Renae,  Please discuss this pt's INR with Dr Lovena Le.  The pt's neurologist Dr Leonie Man just documented on 12/06/15   Garvin Fila, MD (Physician)    I had a long discussion with the patient and husband regarding her remote stroke, risk for recurrent strokes and answered questions. Continue warfarin for her primary hypercoagulable disorder and stroke prevention and close monitoring of INR with target goal of between 2 and 3.    The pt's INR with our coumadin clinic is 2.5-3.5. Please clarify with Dr Lovena Le which INR is correct and let the coumadin clinic know.  Thank you, Ander Purpura

## 2015-12-14 NOTE — Patient Instructions (Signed)
Return as needed

## 2015-12-15 ENCOUNTER — Ambulatory Visit (INDEPENDENT_AMBULATORY_CARE_PROVIDER_SITE_OTHER): Payer: Managed Care, Other (non HMO)

## 2015-12-15 DIAGNOSIS — Z0289 Encounter for other administrative examinations: Secondary | ICD-10-CM

## 2015-12-15 DIAGNOSIS — I6529 Occlusion and stenosis of unspecified carotid artery: Secondary | ICD-10-CM

## 2015-12-16 NOTE — Progress Notes (Signed)
Patient here today with her husband for an extended discussion about risks and benefits of different anticoagulants. Her complicated clinical history is summarized in my office note of 04/12/2015. To summarize, she has had long-standing problems with paroxysmal atrial fibrillation and atrial flutter. She was poorly responsive to medical therapy. She has had 2 radiofrequency ablation procedures. She had a remote history of renal infarction presumably related to the atrial fibrillation and was on chronic warfarin anticoagulation. She had a TIA in June 2002. She had recurrent, multiple, infarcts in her left kidney in February 2010 with a single infarct in the right kidney. She has a small PFO but this was not felt to be clinically significant and has not been closed. I evaluated her at that time. A hypercoagulation profile unrevealing except for an elevated factor VIII. Warfarin was subtherapeutic at time of admission. I recommended increasing target range to 2.5-3.5 and adding 81 mg of aspirin. Factor VIII levels were reproducibly increased at time of subsequent office follow-up. Family testing was done and one of HER-2 daughters was tested and also found to have an elevated factor VIII supporting that this was a congenital problem and not an acute phase reactant. Another daughter had a normal factor VIII level. Her 2 sons were not tested. She was lost to my follow-up until a recent January 2017 admission. Along the way she had been put on Pradaxa. At the time of that admission she presented with acute neurologic changes with a left hemiplegia and disconjugate gaze. She was found to have embolic occlusion of the terminal portion of the left internal carotid artery, left middle cerebral artery, and proximal left anterior cerebral artery. She underwent emergency endovascular revascularization and thrombectomy with a 100% efficacy and restoration of flow. Further evaluation with a TEE on 03/08/2015 showed a 2.1 x 1.2 cm  thrombus in a enlarged left atrial appendage. A septum secundum atrial defect 5 x 2 mm noted. Left-to-right shunting. I recommended that she go back on warfarin at that time. I felt that she was a Pradaxa failure. I did not think that a alternative Xa inhibitor would do any better than the Pradaxa. She has multiple indications for chronic anticoagulation.  She was recently reevaluated by her cardiologist Dr. Sabino Snipes who was kind enough to send me a follow-up note. He discussed risk-benefit of a surgical procedure to close her left atrial appendage but he felt that the risks would exceed any benefit for this procedure which is not yet approved for general use. He also felt that the device itself might be thrombogenic.  The patient would really like to get off of warfarin. She has normal renal function for age. The husband now reports that she missed a number of doses of the Pradaxa prior to her stroke. It is certainly possible that she had rebound thrombosis from having missed some doses. 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  Impression: I do not feel strongly that she has to stay on warfarin. We discussed advantages with respect to being able to check blood levels. However given the considerations above, and her very respectable renal function, I do not see any contraindication at this time to her going back on a NOAC. She has a follow-up appointment with her cardiologist soon and will make a final decision.  Physical examination not done at time of this visit. 100%  of the time was spent with direct face-to-face consultation and counseling for the patient and her husband for approximately 30 minutes.

## 2015-12-21 ENCOUNTER — Ambulatory Visit (INDEPENDENT_AMBULATORY_CARE_PROVIDER_SITE_OTHER): Payer: Managed Care, Other (non HMO) | Admitting: Internal Medicine

## 2015-12-21 ENCOUNTER — Ambulatory Visit (INDEPENDENT_AMBULATORY_CARE_PROVIDER_SITE_OTHER): Payer: Managed Care, Other (non HMO) | Admitting: *Deleted

## 2015-12-21 ENCOUNTER — Encounter: Payer: Self-pay | Admitting: Internal Medicine

## 2015-12-21 VITALS — BP 122/64 | HR 64 | Ht 67.5 in | Wt 144.1 lb

## 2015-12-21 DIAGNOSIS — I119 Hypertensive heart disease without heart failure: Secondary | ICD-10-CM

## 2015-12-21 DIAGNOSIS — I480 Paroxysmal atrial fibrillation: Secondary | ICD-10-CM

## 2015-12-21 DIAGNOSIS — Q211 Atrial septal defect: Secondary | ICD-10-CM

## 2015-12-21 DIAGNOSIS — I6350 Cerebral infarction due to unspecified occlusion or stenosis of unspecified cerebral artery: Secondary | ICD-10-CM | POA: Diagnosis not present

## 2015-12-21 DIAGNOSIS — Z5181 Encounter for therapeutic drug level monitoring: Secondary | ICD-10-CM

## 2015-12-21 DIAGNOSIS — I4892 Unspecified atrial flutter: Secondary | ICD-10-CM

## 2015-12-21 DIAGNOSIS — Q2112 Patent foramen ovale: Secondary | ICD-10-CM

## 2015-12-21 LAB — POCT INR: INR: 3

## 2015-12-21 MED ORDER — AMLODIPINE BESYLATE 5 MG PO TABS: 5.0000 mg | ORAL_TABLET | Freq: Every day | ORAL | 3 refills | Status: DC

## 2015-12-21 MED ORDER — METOPROLOL SUCCINATE ER 100 MG PO TB24: 100.0000 mg | ORAL_TABLET | Freq: Every day | ORAL | 3 refills | Status: DC

## 2015-12-21 NOTE — Progress Notes (Signed)
HPI Kendra [name] returns today for followup. She is a pleasant 70 yo woman with a hypercoaguable state with multiple embolic events including a stroke 9 months ago. She has a long h/o atrial arrhythmias which have been quiet clinically for many years. 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 She has noted worsening HTN. She was found to have a persistence of LAA thrombus on 2 different TEE's in the past few months. After much reflection and consultation by interventional cardiology, neurology and hematology, it was thought that her best option was using coumadin with a higher INR, and low dose ASA. The patient has almost a complete recovery from her stroke. She has seen Dr. Albertine Patricia in [place] for a second opinion regarding additional therapeutic options. The patient noted on my last visit that she had missed taking her blood thinner for several days prior to her stroke. She now feels well. She is interested in doing home INR monitoring of her coumadin. Allergies  Allergen Reactions  . Amiodarone Hcl Nausea Only and Rash     Current Outpatient Prescriptions  Medication Sig Dispense Refill  . amLODipine (NORVASC) 5 MG tablet Take 1 tablet (5 mg total) by mouth daily. 90 tablet 3  . aspirin 81 MG tablet Take 81 mg by mouth daily.    . Fish Oil-Cholecalciferol (FISH OIL + D3 PO) Take 1 capsule by mouth daily.    . hydrocortisone (ANUSOL-HC) 2.5 % rectal cream Place 1 application rectally 2 (two) times daily as needed (Use as directed).     . metoprolol succinate (TOPROL-XL) 100 MG 24 hr tablet Take 1 tablet (100 mg total) by mouth daily. Take with or immediately following a meal. 90 tablet 3  . Multiple Vitamin (MULTIVITAMIN) tablet Take 1 tablet by mouth daily.      Marland Kitchen warfarin (COUMADIN) 5 MG tablet Take as directed by the Coumadin Clinic. 35 tablet 3   No current facility-administered medications for this visit.      Past  Medical History:  Diagnosis Date  . Arthus phenomenon   . Atrial fibrillation ([place])   . Atrial flutter ([place])   . Chronic anticoagulation 12/14/2015  . 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 GERD (gastroesophageal reflux disease)   . Hemorrhoids   . Hx of blood clots   . Hypercholesterolemia   . Kidney infarction [facility])   . Stroke ([place])   . TIA (transient ischemic attack)    as per 07/30/00 note from [facility]     ROS:   All systems reviewed and negative except as noted in the HPI.   Past Surgical History:  Procedure Laterality Date  . ablasion    . APPENDECTOMY    . BREAST BIOPSY Right   . CARDIAC ELECTROPHYSIOLOGY STUDY AND ABLATION    . CESAREAN SECTION    . fibroid tumor removal  1996  . RADIOLOGY WITH ANESTHESIA N/A 02/16/2015   Procedure: RADIOLOGY WITH ANESTHESIA;  Surgeon: Medication Radiologist, MD;  Location: [place] [facility];  Service: Radiology;  Laterality: N/A;  . TEE WITHOUT CARDIOVERSION N/A 03/08/2015   Procedure: TRANSESOPHAGEAL ECHOCARDIOGRAM (TEE);  Surgeon: [name], MD;  Location: [place];  Service: Cardiovascular;  Laterality: N/A;  . TEE WITHOUT CARDIOVERSION N/A 04/28/2015   Procedure: TRANSESOPHAGEAL ECHOCARDIOGRAM (TEE);  Surgeon: [name], MD;  Location: [place];  Service: Cardiovascular;  Laterality: N/A;  Family History  Problem Relation Age of Onset  . Stroke Maternal Grandmother   . Alcohol abuse Father   . Lung cancer Father   . Other Brother     Emotional Illness  . Colon cancer Neg Hx      Social History   Social History  . Marital status: Married    Spouse name: N/A  . Number of children: 4  . Years of education: N/A   Occupational History  . 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 Smoking status: Never Smoker  . Smokeless tobacco: Never Used  . Alcohol use No  . Drug use: No  . Sexual activity: Not on file   Other Topics Concern  . Not  on file   Social History Narrative   ** Merged History Encounter **       Lives with husband   No caffeine drinks      BP 122/64   Pulse 64   Ht 5' 7.5" (1.715 m)   Wt 144 lb 1.9 oz (65.4 kg)   BMI 22.24 kg/m   Physical Exam:  Well appearing NAD HEENT: Unremarkable Neck:  No JVD, no thyromegally Lymphatics:  No adenopathy Back:  No CVA tenderness Lungs:  Clear HEART:  Regular rate rhythm, no murmurs, no rubs, no clicks Abd:  soft, positive bowel sounds, no organomegally, no rebound, no guarding Ext:  2 plus pulses, no edema, no cyanosis, no clubbing Skin:  No rashes no nodules Neuro:  CN II through XII intact, motor grossly intact  EKG - NSR   Assess/Plan: 1. Atrial fib - she has had no symptoms in nearly 10 years. She will continue his current meds. 2. HTN - her blood pressure is better after the addition of amlodipine. 3. Dyslipidemia - she will continue her statin therapy.  4. Coags - after additional reflection, she will continue on coumadin and low dose ASA.  Mikle Bosworth.D.

## 2015-12-21 NOTE — Patient Instructions (Addendum)
Medication Instructions:  Your physician recommends that you continue on your current medications as directed. Please refer to the Current Medication list given to you today.   Labwork: None Ordered   Testing/Procedures: None Ordered   Follow-Up: Your physician wants you to follow-up in: 6 months with Dr. Taylor. You will receive a reminder letter in the mail two months in advance. If you don't receive a letter, please call our office to schedule the follow-up appointment.    Any Other Special Instructions Will Be Listed Below (If Applicable).     If you need a refill on your cardiac medications before your next appointment, please call your pharmacy.   

## 2016-01-03 ENCOUNTER — Telehealth: Payer: Self-pay | Admitting: Neurology

## 2016-01-03 NOTE — Telephone Encounter (Signed)
Patient is calling to get doppler results. °

## 2016-01-03 NOTE — Telephone Encounter (Signed)
Rn call patient back about her TCD/carotid duplex results. Rn stated per Dr. Leonie Man report the TCD showed mild hardening of the arteries ,age related expected nothing to worry about. PT verbalized understanding.  RN stated the carotid duplex study was normal. Pt verbalized understanding.

## 2016-01-05 ENCOUNTER — Other Ambulatory Visit: Payer: Self-pay | Admitting: Cardiovascular Disease

## 2016-01-13 ENCOUNTER — Telehealth: Payer: Self-pay | Admitting: *Deleted

## 2016-01-13 ENCOUNTER — Ambulatory Visit (INDEPENDENT_AMBULATORY_CARE_PROVIDER_SITE_OTHER): Payer: Managed Care, Other (non HMO) | Admitting: *Deleted

## 2016-01-13 DIAGNOSIS — Z5181 Encounter for therapeutic drug level monitoring: Secondary | ICD-10-CM | POA: Diagnosis not present

## 2016-01-13 DIAGNOSIS — Q2112 Patent foramen ovale: Secondary | ICD-10-CM

## 2016-01-13 DIAGNOSIS — I6350 Cerebral infarction due to unspecified occlusion or stenosis of unspecified cerebral artery: Secondary | ICD-10-CM

## 2016-01-13 DIAGNOSIS — Q211 Atrial septal defect: Secondary | ICD-10-CM

## 2016-01-13 DIAGNOSIS — I480 Paroxysmal atrial fibrillation: Secondary | ICD-10-CM | POA: Diagnosis not present

## 2016-01-13 DIAGNOSIS — I4892 Unspecified atrial flutter: Secondary | ICD-10-CM

## 2016-01-13 LAB — POCT INR: INR: 2.3

## 2016-01-13 NOTE — Telephone Encounter (Signed)
Pt's current INR goal range in coumadin clinic in 2.5 to 3.5

## 2016-01-13 NOTE — Telephone Encounter (Signed)
-----   Message from Iven Finn, LPN sent at [id number]  4:12 PM EST ----- Regarding: FW: INR range Dr. Lovena Le advised pt's INR range should be 2.5 - 3.5  Thanks, Renae  ----- Message ----- From: Dionicio Stall, RN Sent: 01/13/2016   8:36 AM To: Barkley Boards, RN, Garvin Fila, MD, # Subject: FW: INR range                                    ----- Message ----- From: Evans Lance, MD Sent: 01/12/2016   9:14 AM To: Dionicio Stall, RN Subject: RE: INR range                                  My goal for her INR would be 2.5-3.5. GT ----- Message ----- From: Dionicio Stall, RN Sent: 01/07/2016  11:02 AM To: Iven Finn, LPN, Evans Lance, MD Subject: Kendra [name]: INR range                                    ----- Message ----- From: Barkley Boards, RN Sent: 12/10/2015  10:38 AM To: Iven Finn, LPN, Evans Lance, MD Subject: INR range                                      Renae,  Please discuss this pt's INR with Dr Lovena Le.  The pt's neurologist Dr Leonie Man just documented on 12/06/15   Garvin Fila, MD (Physician)    I had a long discussion with the patient and husband regarding her remote stroke, risk for recurrent strokes and answered questions. Continue warfarin for her primary hypercoagulable disorder and stroke prevention and close monitoring of INR with target goal of between 2 and 3.    The pt's INR with our coumadin clinic is 2.5-3.5. Please clarify with Dr Lovena Le which INR is correct and let the coumadin clinic know.  Thank you, Ander Purpura

## 2016-01-14 ENCOUNTER — Telehealth: Payer: Self-pay | Admitting: Pharmacist

## 2016-01-14 NOTE — Telephone Encounter (Signed)
Home monitoring INR form faxed to [facility] on 01/14/16.

## 2016-01-27 ENCOUNTER — Ambulatory Visit (INDEPENDENT_AMBULATORY_CARE_PROVIDER_SITE_OTHER): Payer: Managed Care, Other (non HMO) | Admitting: *Deleted

## 2016-01-27 DIAGNOSIS — Z5181 Encounter for therapeutic drug level monitoring: Secondary | ICD-10-CM | POA: Diagnosis not present

## 2016-01-27 DIAGNOSIS — I6350 Cerebral infarction due to unspecified occlusion or stenosis of unspecified cerebral artery: Secondary | ICD-10-CM

## 2016-01-27 DIAGNOSIS — I480 Paroxysmal atrial fibrillation: Secondary | ICD-10-CM

## 2016-01-27 DIAGNOSIS — Q211 Atrial septal defect: Secondary | ICD-10-CM | POA: Diagnosis not present

## 2016-01-27 DIAGNOSIS — Q2112 Patent foramen ovale: Secondary | ICD-10-CM

## 2016-01-27 DIAGNOSIS — I4892 Unspecified atrial flutter: Secondary | ICD-10-CM

## 2016-01-27 LAB — POCT INR: INR: 2.5

## 2016-02-04 ENCOUNTER — Telehealth: Payer: Self-pay | Admitting: Pharmacist

## 2016-02-04 LAB — HM MAMMOGRAPHY

## 2016-02-04 NOTE — Telephone Encounter (Signed)
Spoke with Lorre Nick, representative with [facility]. He wanted to report that pt has been approved for [place]. A machine is being sent to pt and they will coordinate pt training. Nothing further needed from our end.

## 2016-02-07 LAB — POCT INR: INR: 2.7

## 2016-02-08 ENCOUNTER — Ambulatory Visit (INDEPENDENT_AMBULATORY_CARE_PROVIDER_SITE_OTHER): Payer: Managed Care, Other (non HMO) | Admitting: Cardiology

## 2016-02-08 DIAGNOSIS — I480 Paroxysmal atrial fibrillation: Secondary | ICD-10-CM

## 2016-02-08 DIAGNOSIS — Z5181 Encounter for therapeutic drug level monitoring: Secondary | ICD-10-CM

## 2016-02-08 DIAGNOSIS — Q211 Atrial septal defect: Secondary | ICD-10-CM

## 2016-02-08 DIAGNOSIS — I6350 Cerebral infarction due to unspecified occlusion or stenosis of unspecified cerebral artery: Secondary | ICD-10-CM

## 2016-02-08 DIAGNOSIS — Q2112 Patent foramen ovale: Secondary | ICD-10-CM

## 2016-02-09 ENCOUNTER — Encounter: Payer: Self-pay | Admitting: Internal Medicine

## 2016-02-23 ENCOUNTER — Ambulatory Visit (INDEPENDENT_AMBULATORY_CARE_PROVIDER_SITE_OTHER): Payer: Managed Care, Other (non HMO) | Admitting: Internal Medicine

## 2016-02-23 DIAGNOSIS — Q211 Atrial septal defect: Secondary | ICD-10-CM

## 2016-02-23 DIAGNOSIS — I6350 Cerebral infarction due to unspecified occlusion or stenosis of unspecified cerebral artery: Secondary | ICD-10-CM

## 2016-02-23 DIAGNOSIS — Z5181 Encounter for therapeutic drug level monitoring: Secondary | ICD-10-CM

## 2016-02-23 DIAGNOSIS — I480 Paroxysmal atrial fibrillation: Secondary | ICD-10-CM

## 2016-02-23 DIAGNOSIS — Q2112 Patent foramen ovale: Secondary | ICD-10-CM

## 2016-02-23 DIAGNOSIS — I4892 Unspecified atrial flutter: Secondary | ICD-10-CM

## 2016-02-23 LAB — POCT INR: INR: 2.6

## 2016-03-08 ENCOUNTER — Ambulatory Visit (INDEPENDENT_AMBULATORY_CARE_PROVIDER_SITE_OTHER): Payer: Managed Care, Other (non HMO) | Admitting: Cardiovascular Disease

## 2016-03-08 ENCOUNTER — Encounter: Payer: Self-pay | Admitting: Internal Medicine

## 2016-03-08 ENCOUNTER — Ambulatory Visit (INDEPENDENT_AMBULATORY_CARE_PROVIDER_SITE_OTHER): Payer: Managed Care, Other (non HMO) | Admitting: Internal Medicine

## 2016-03-08 VITALS — BP 116/58 | HR 67 | Temp 98.0°F | Resp 16 | Ht 67.25 in | Wt 148.2 lb

## 2016-03-08 DIAGNOSIS — E559 Vitamin D deficiency, unspecified: Secondary | ICD-10-CM | POA: Diagnosis not present

## 2016-03-08 DIAGNOSIS — R5383 Other fatigue: Secondary | ICD-10-CM

## 2016-03-08 DIAGNOSIS — I480 Paroxysmal atrial fibrillation: Secondary | ICD-10-CM

## 2016-03-08 DIAGNOSIS — I6350 Cerebral infarction due to unspecified occlusion or stenosis of unspecified cerebral artery: Secondary | ICD-10-CM

## 2016-03-08 DIAGNOSIS — I63132 Cerebral infarction due to embolism of left carotid artery: Secondary | ICD-10-CM

## 2016-03-08 DIAGNOSIS — Q2112 Patent foramen ovale: Secondary | ICD-10-CM

## 2016-03-08 DIAGNOSIS — E785 Hyperlipidemia, unspecified: Secondary | ICD-10-CM

## 2016-03-08 DIAGNOSIS — I10 Essential (primary) hypertension: Secondary | ICD-10-CM

## 2016-03-08 DIAGNOSIS — D66 Hereditary factor VIII deficiency: Secondary | ICD-10-CM

## 2016-03-08 DIAGNOSIS — Z79899 Other long term (current) drug therapy: Secondary | ICD-10-CM | POA: Diagnosis not present

## 2016-03-08 DIAGNOSIS — Q211 Atrial septal defect: Secondary | ICD-10-CM

## 2016-03-08 DIAGNOSIS — Z0000 Encounter for general adult medical examination without abnormal findings: Secondary | ICD-10-CM

## 2016-03-08 DIAGNOSIS — Z5181 Encounter for therapeutic drug level monitoring: Secondary | ICD-10-CM

## 2016-03-08 DIAGNOSIS — I4892 Unspecified atrial flutter: Secondary | ICD-10-CM

## 2016-03-08 DIAGNOSIS — E2839 Other primary ovarian failure: Secondary | ICD-10-CM | POA: Diagnosis not present

## 2016-03-08 DIAGNOSIS — Z7901 Long term (current) use of anticoagulants: Secondary | ICD-10-CM

## 2016-03-08 LAB — VITAMIN D 25 HYDROXY (VIT D DEFICIENCY, FRACTURES): VITD: 30.33 ng/mL (ref 30.00–100.00)

## 2016-03-08 LAB — COMPREHENSIVE METABOLIC PANEL
ALBUMIN: 4.1 g/dL (ref 3.5–5.2)
ALT: 20 U/L (ref 0–35)
AST: 19 U/L (ref 0–37)
Alkaline Phosphatase: 88 U/L (ref 39–117)
BILIRUBIN TOTAL: 0.6 mg/dL (ref 0.2–1.2)
BUN: 16 mg/dL (ref 6–23)
CHLORIDE: 103 meq/L (ref 96–112)
CO2: 27 mEq/L (ref 19–32)
CREATININE: 0.8 mg/dL (ref 0.40–1.20)
Calcium: 9.4 mg/dL (ref 8.4–10.5)
GFR: 74.97 mL/min (ref >60.00)
Glucose, Bld: 83 mg/dL (ref 70–99)
Potassium: 4.5 mEq/L (ref 3.5–5.1)
SODIUM: 139 meq/L (ref 135–145)
Total Protein: 6.9 g/dL (ref 6.0–8.3)

## 2016-03-08 LAB — CBC WITH DIFFERENTIAL/PLATELET
BASOS PCT: 0.6 % (ref 0.0–3.0)
Basophils Absolute: 0 10*3/uL (ref 0.0–0.1)
EOS ABS: 0.1 10*3/uL (ref 0.0–0.7)
Eosinophils Relative: 1.6 % (ref 0.0–5.0)
HEMATOCRIT: 43.7 % (ref 36.0–46.0)
HEMOGLOBIN: 15.1 g/dL — AB (ref 12.0–15.0)
LYMPHS PCT: 21.8 % (ref 12.0–46.0)
Lymphs Abs: 1.5 10*3/uL (ref 0.7–4.0)
MCHC: 34.6 g/dL (ref 30.0–36.0)
MCV: 91.1 fl (ref 78.0–100.0)
Monocytes Absolute: 0.6 10*3/uL (ref 0.1–1.0)
Monocytes Relative: 8 % (ref 3.0–12.0)
NEUTROS ABS: 4.8 10*3/uL (ref 1.4–7.7)
Neutrophils Relative %: 68 % (ref 43.0–77.0)
PLATELETS: 257 10*3/uL (ref 150.0–400.0)
RBC: 4.8 Mil/uL (ref 3.87–5.11)
RDW: 12.6 % (ref 11.5–15.5)
WBC: 7 10*3/uL (ref 4.0–10.5)

## 2016-03-08 LAB — POCT INR: INR: 3.1

## 2016-03-08 LAB — LDL CHOLESTEROL, DIRECT: Direct LDL: 117 mg/dL

## 2016-03-08 LAB — TSH: TSH: 1.26 u[IU]/mL (ref 0.35–4.50)

## 2016-03-08 NOTE — Progress Notes (Signed)
Patient ID: Kendra [name], female    DOB: Jul 07, 1945  Age: 71 y.o. MRN: [id number]  The patient is here for annual Medicare wellness examination and management of other chronic and acute problems.  Seen feb 2017 for initial establishmenet of care   Flu vaccine given  At [place] [facility] normal Has a Family PCP in [place]  coumadin management done at home bi weekly, monitored  by Crissie Sickles  Released by Dr Leonie Man.  Right eye cataract needs removal. Has been postponing it.   Some sleep issues which are  Intermittent.   Falling asleep is the issue.  Does not want to use meds    The risk factors are reflected in the social history.  The roster of all physicians providing medical care to patient - is listed in the Snapshot section of the chart.  Activities of daily living:  The patient is 100% independent in all ADLs: dressing, toileting, feeding as well as independent mobility  Home safety : The patient has smoke detectors in the home. They wear seatbelts.  There are no firearms at home. There is no violence in the home.   There is no risks for hepatitis, STDs or HIV. There is no   history of blood transfusion. They have no travel history to infectious disease endemic areas of the world.  The patient has seen their dentist in the last six month. They have seen their eye doctor in the last year. They admit to slight hearing difficulty with regard to whispered voices and some television programs.  They have deferred audiologic testing in the last year.  They do not  have excessive sun exposure. Discussed the need for sun protection: hats, long sleeves and use of sunscreen if there is significant sun exposure.   Diet: the importance of a healthy diet is discussed. They do have a healthy diet.  The benefits of regular aerobic exercise were discussed. She walks 4 times per week ,  20 minutes.   Depression screen: there are no signs or vegative symptoms of depression-  irritability, change in appetite, anhedonia, sadness/tearfullness.  Cognitive assessment: the patient manages all their financial and personal affairs and is actively engaged. They could relate day,date,year and events; recalled 2/3 objects at 3 minutes; performed clock-face test normally.  The following portions of the patient's history were reviewed and updated as appropriate: allergies, current medications, past family history, past medical history,  past surgical history, past social history  and problem list.  Visual acuity was not assessed per patient preference since she has regular follow up with her ophthalmologist. Hearing and body mass index were assessed and reviewed.   During the course of the visit the patient was educated and counseled about appropriate screening and preventive services including : fall prevention , diabetes screening, nutrition counseling, colorectal cancer screening, and recommended immunizations.    CC: The primary encounter diagnosis was Estrogen deficiency. 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  Hemorrhoid pain and irritation occasionally  Using HC cream .   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   She has a past surgical history that includes ablasion; Cesarean section; Breast biopsy (Right); Appendectomy; fibroid tumor removal (1996); Cardiac electrophysiology study and ablation; Radiology with anesthesia (N/A, 02/16/2015); TEE without cardioversion (N/A, 03/08/2015); and TEE without cardioversion (N/A, 04/28/2015).   Her family history includes Alcohol abuse in her father; Lung cancer in her brother and father; Other in her brother; Stroke in her maternal grandmother.She reports that she has never smoked. She has never used smokeless tobacco. She reports that she does not drink alcohol or use drugs.  Outpatient Medications Prior to Visit  Medication Sig Dispense Refill  . amLODipine (NORVASC) 5 MG tablet Take 1 tablet (5 mg total) by mouth daily. 90 tablet 3  . aspirin 81 MG tablet Take 81 mg by mouth daily.    . Fish Oil-Cholecalciferol (FISH OIL + D3 PO) Take 1 capsule by mouth daily.    . hydrocortisone (ANUSOL-HC) 2.5 % rectal cream Place 1 application rectally 2 (two) times daily as needed (Use as directed).     . metoprolol succinate (TOPROL-XL) 100 MG 24 hr tablet Take 1 tablet (100 mg total) by mouth daily. Take with or immediately following a meal. 90 tablet 3  . Multiple Vitamin (MULTIVITAMIN) tablet Take 1 tablet by mouth daily.      Marland Kitchen warfarin (COUMADIN) 5 MG tablet TAKE AS DIRECTED BY  COUMADIN  CLINIC 90 tablet 0   No facility-administered medications prior to visit.     Review of Systems   Patient denies headache, fevers, malaise, unintentional weight loss, skin rash, eye pain, sinus congestion and sinus pain, sore throat, dysphagia,  hemoptysis , cough, dyspnea, wheezing, chest pain, palpitations, orthopnea, edema, abdominal pain, nausea, melena, diarrhea, constipation, flank pain, dysuria, hematuria, urinary  Frequency, nocturia, numbness, tingling, seizures,  Focal weakness, Loss of consciousness,  Tremor,  depression, anxiety, and suicidal ideation.      Objective:  BP (!) 116/58   Pulse 67   Temp 98 F (36.7 C) (Oral)   Resp 16   Ht 5' 7.25" (1.708 m)   Wt 148 lb 4 oz (67.2 kg)   SpO2 97%   BMI 23.05 kg/m   Physical Exam   General appearance: alert, cooperative and appears stated age Head: Normocephalic, without obvious abnormality, atraumatic Eyes: conjunctivae/corneas clear. PERRL, EOM's intact. Fundi benign. Ears: normal TM's and external ear canals both ears Nose: Nares normal. Septum midline. Mucosa normal. No drainage or sinus tenderness. Throat: lips, mucosa, and tongue normal; teeth and gums normal Neck: no adenopathy, no carotid bruit, no JVD, supple, symmetrical, trachea midline and thyroid not enlarged, symmetric, no tenderness/mass/nodules Lungs: clear to auscultation bilaterally Breasts: normal appearance, no masses or tenderness Heart: regular rate and rhythm, S1, S2 normal, no murmur, click, rub or gallop Abdomen: soft, non-tender; bowel sounds normal; no masses,  no organomegaly Extremities: extremities normal, atraumatic, no cyanosis or edema Pulses: 2+ and symmetric Skin: Skin color, texture, turgor normal. No rashes or lesions Neurologic: Alert and oriented X 3, normal strength and tone. Normal symmetric reflexes. Normal coordination and gait.     Assessment & Plan:   Problem List Items Addressed This Visit    Atrial fibrillation ([place])    Embolic risk managed with warfarin. 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  Continue coumadin.      Chronic anticoagulation    CBC ordered; platelet ct and hgb normal.   Lab Results  Component Value Date   WBC 7.0 03/08/2016   HGB 15.1 (H) 03/08/2016   HCT 43.7 03/08/2016   MCV 91.1 03/08/2016   PLT 257.0 03/08/2016         Congenital factor VIII  disorder (HCC)    Elevated levels noted during hypercoag workup.  Several daughters also affected.  Continue coumadin , hematology follow up       Embolic stroke ([place])    Continue warfarin.  Deficits are minimal.       Essential hypertension    Well controlled on current regimen. Renal function stable, no changes today.  Lab Results  Component Value Date   CREATININE 0.80 03/08/2016   Lab Results  Component Value Date   NA 139 03/08/2016   K 4.5 03/08/2016   CL 103 03/08/2016   CO2 27 03/08/2016         Visit for preventive health examination    Annual comprehensive preventive exam was done as well as an evaluation and management of chronic conditions .  During the course of the visit the patient was educated and counseled about appropriate screening and preventive services including :  diabetes screening, lipid analysis with projected  10 year  risk for CAD , nutrition counseling, breast, cervical and colorectal cancer screening, and recommended immunizations.  Printed recommendations for health maintenance screenings was given       Other Visit Diagnoses    Estrogen deficiency    -  Primary   Relevant Orders   DG Bone Density   Long-term use of high-risk medication       Relevant Orders   Comprehensive metabolic panel (Completed)   Fatigue, unspecified type       Relevant Orders   TSH (Completed)   Long term current use of anticoagulant therapy       Relevant Orders   CBC with Differential/Platelet (Completed)   Vitamin D deficiency       Relevant Orders   VITAMIN D 25 Hydroxy (Vit-D Deficiency, Fractures) (Completed)   Hyperlipidemia LDL goal <100       Relevant Orders   LDL cholesterol, direct (Completed)      I am having Ms. Billig maintain her multivitamin, Fish Oil-Cholecalciferol (FISH OIL + D3 PO), aspirin, hydrocortisone, amLODipine, metoprolol succinate, and warfarin.  No orders of the defined types were placed in this encounter.   There are no  discontinued medications.  Follow-up: Return in about 1 year (around 03/08/2017).   Crecencio Mc, MD

## 2016-03-08 NOTE — Patient Instructions (Signed)
Good to see you!  I have ordered your bone density test  I DO recommend the new shingles vaccine ("shingRx")    I recommend getting the majority of your calcium and Vitamin D  through diet rather than supplements given the recent association of calcium supplements with increased coronary artery calcium scores.  You need 1200 to 1800 mg calcium daily,  And 1000 IUS of D3 DAILY (UNLESS your level is low on today's labs)    Try the almond and cashew milks that most grocery stores  now carry  in the dairy  Section>   They are lactose free:  [employer] brand Almond Light,  Original formula.  Delicious,  Low carb,  Low cal,  Cholesterol free    Health Maintenance for Postmenopausal Women Introduction Menopause is a normal process in which your reproductive ability comes to an end. This process happens gradually over a span of months to years, usually between the ages of 48 and 55. Menopause is complete when you have missed 12 consecutive menstrual periods. It is important to talk with your health care provider about some of the most common conditions that affect postmenopausal women, such as heart disease, cancer, and bone loss (osteoporosis). Adopting a healthy lifestyle and getting preventive care can help to promote your health and wellness. Those actions can also lower your chances of developing some of these common conditions. What should I know about menopause? During menopause, you may experience a number of symptoms, such as:  Moderate-to-severe hot flashes.  Night sweats.  Decrease in sex drive.  Mood swings.  Headaches.  Tiredness.  Irritability.  Memory problems.  Insomnia. Choosing to treat or not to treat menopausal changes is an individual decision that you make with your health care provider. What should I know about hormone replacement therapy and supplements? Hormone therapy products are effective for treating symptoms that are associated with menopause, such as hot  flashes and night sweats. Hormone replacement carries certain risks, especially as you become older. If you are thinking about using estrogen or estrogen with progestin treatments, discuss the benefits and risks with your health care provider. What should I know about heart disease and stroke? Heart disease, heart attack, and stroke become more likely as you age. This may be due, in part, to the hormonal changes that your body experiences during menopause. These can affect how your body processes dietary fats, triglycerides, and cholesterol. Heart attack and stroke are both medical emergencies. There are many things that you can do to help prevent heart disease and stroke:  Have your blood pressure checked at least every 1-2 years. High blood pressure causes heart disease and increases the risk of stroke.  If you are 55-79 years old, ask your health care provider if you should take aspirin to prevent a heart attack or a stroke.  Do not use any tobacco products, including cigarettes, chewing tobacco, or electronic cigarettes. If you need help quitting, ask your health care provider.  It is important to eat a healthy diet and maintain a healthy weight.  Be sure to include plenty of vegetables, fruits, low-fat dairy products, and lean protein.  Avoid eating foods that are high in solid fats, added sugars, or salt (sodium).  Get regular exercise. This is one of the most important things that you can do for your health.  Try to exercise for at least 150 minutes each week. The type of exercise that you do should increase your heart rate and make you sweat. This   is known as moderate-intensity exercise.  Try to do strengthening exercises at least twice each week. Do these in addition to the moderate-intensity exercise.  Know your numbers.Ask your health care provider to check your cholesterol and your blood glucose. Continue to have your blood tested as directed by your health care provider. What  should I know about cancer screening? There are several types of cancer. Take the following steps to reduce your risk and to catch any cancer development as early as possible. Breast Cancer  Practice breast self-awareness.  This means understanding how your breasts normally appear and feel.  It also means doing regular breast self-exams. Let your health care provider know about any changes, no matter how small.  If you are 40 or older, have a clinician do a breast exam (clinical breast exam or CBE) every year. Depending on your age, family history, and medical history, it may be recommended that you also have a yearly breast X-ray (mammogram).  If you have a family history of breast cancer, talk with your health care provider about genetic screening.  If you are at high risk for breast cancer, talk with your health care provider about having an MRI and a mammogram every year.  Breast cancer (BRCA) gene test is recommended for women who have family members with BRCA-related cancers. Results of the assessment will determine the need for genetic counseling and BRCA1 and for BRCA2 testing. BRCA-related cancers include these types:  Breast. This occurs in males or females.  Ovarian.  Tubal. This may also be called fallopian tube cancer.  Cancer of the abdominal or pelvic lining (peritoneal cancer).  Prostate.  Pancreatic. Cervical, Uterine, and Ovarian Cancer  Your health care provider may recommend that you be screened regularly for cancer of the pelvic organs. These include your ovaries, uterus, and vagina. This screening involves a pelvic exam, which includes checking for microscopic changes to the surface of your cervix (Pap test).  For women ages 21-65, health care providers may recommend a pelvic exam and a Pap test every three years. For women ages 30-65, they may recommend the Pap test and pelvic exam, combined with testing for human papilloma virus (HPV), every five years. Some  types of HPV increase your risk of cervical cancer. Testing for HPV may also be done on women of any age who have unclear Pap test results.  Other health care providers may not recommend any screening for nonpregnant women who are considered low risk for pelvic cancer and have no symptoms. Ask your health care provider if a screening pelvic exam is right for you.  If you have had past treatment for cervical cancer or a condition that could lead to cancer, you need Pap tests and screening for cancer for at least 20 years after your treatment. If Pap tests have been discontinued for you, your risk factors (such as having a new sexual partner) need to be reassessed to determine if you should start having screenings again. Some women have medical problems that increase the chance of getting cervical cancer. In these cases, your health care provider may recommend that you have screening and Pap tests more often.  If you have a family history of uterine cancer or ovarian cancer, talk with your health care provider about genetic screening.  If you have vaginal bleeding after reaching menopause, tell your health care provider.  There are currently no reliable tests available to screen for ovarian cancer. Lung Cancer  Lung cancer screening is recommended for adults   58-63 years old who are at high risk for lung cancer because of a history of smoking. A yearly low-dose CT scan of the lungs is recommended if you:  Currently smoke.  Have a history of at least 30 pack-years of smoking and you currently smoke or have quit within the past 15 years. A pack-year is smoking an average of one pack of cigarettes per day for one year. Yearly screening should:  Continue until it has been 15 years since you quit.  Stop if you develop a health problem that would prevent you from having lung cancer treatment. Colorectal Cancer  This type of cancer can be detected and can often be prevented.  Routine colorectal  cancer screening usually begins at age 44 and continues through age 77.  If you have risk factors for colon cancer, your health care provider may recommend that you be screened at an earlier age.  If you have a family history of colorectal cancer, talk with your health care provider about genetic screening.  Your health care provider may also recommend using home test kits to check for hidden blood in your stool.  A small camera at the end of a tube can be used to examine your colon directly (sigmoidoscopy or colonoscopy). This is done to check for the earliest forms of colorectal cancer.  Direct examination of the colon should be repeated every 5-10 years until age 32. However, if early forms of precancerous polyps or small growths are found or if you have a family history or genetic risk for colorectal cancer, you may need to be screened more often. Skin Cancer  Check your skin from head to toe regularly.  Monitor any moles. Be sure to tell your health care provider:  About any new moles or changes in moles, especially if there is a change in a mole's shape or color.  If you have a mole that is larger than the size of a pencil eraser.  If any of your family members has a history of skin cancer, especially at a young age, talk with your health care provider about genetic screening.  Always use sunscreen. Apply sunscreen liberally and repeatedly throughout the day.  Whenever you are outside, protect yourself by wearing long sleeves, pants, a wide-brimmed hat, and sunglasses. What should I know about osteoporosis? Osteoporosis is a condition in which bone destruction happens more quickly than new bone creation. After menopause, you may be at an increased risk for osteoporosis. To help prevent osteoporosis or the bone fractures that can happen because of osteoporosis, the following is recommended:  If you are 60-33 years old, get at least 1,000 mg of calcium and at least 600 mg of vitamin  D per day.  If you are older than age 58 but younger than age 49, get at least 1,200 mg of calcium and at least 600 mg of vitamin D per day.  If you are older than age 37, get at least 1,200 mg of calcium and at least 800 mg of vitamin D per day. Smoking and excessive alcohol intake increase the risk of osteoporosis. Eat foods that are rich in calcium and vitamin D, and do weight-bearing exercises several times each week as directed by your health care provider. What should I know about how menopause affects my mental health? Depression may occur at any age, but it is more common as you become older. Common symptoms of depression include:  Low or sad mood.  Changes in sleep patterns.  Changes  in appetite or eating patterns.  Feeling an overall lack of motivation or enjoyment of activities that you previously enjoyed.  Frequent crying spells. Talk with your health care provider if you think that you are experiencing depression. What should I know about immunizations? It is important that you get and maintain your immunizations. These include:  Tetanus, diphtheria, and pertussis (Tdap) booster vaccine.  Influenza every year before the flu season begins.  Pneumonia vaccine.  Shingles vaccine. Your health care provider may also recommend other immunizations. This information is not intended to replace advice given to you by your health care provider. Make sure you discuss any questions you have with your health care provider. Document Released: 04/07/2005 Document Revised: 09/03/2015 Document Reviewed: 11/17/2014  2017 Elsevier  

## 2016-03-09 ENCOUNTER — Encounter: Payer: Self-pay | Admitting: Internal Medicine

## 2016-03-09 DIAGNOSIS — Z0000 Encounter for general adult medical examination without abnormal findings: Secondary | ICD-10-CM | POA: Insufficient documentation

## 2016-03-09 NOTE — Assessment & Plan Note (Signed)
Well controlled on current regimen. Renal function stable, no changes today.  Lab Results  Component Value Date   CREATININE 0.80 03/08/2016   Lab Results  Component Value Date   NA 139 03/08/2016   K 4.5 03/08/2016   CL 103 03/08/2016   CO2 27 03/08/2016

## 2016-03-09 NOTE — Assessment & Plan Note (Signed)
Elevated levels noted during hypercoag workup.  Several daughters also affected.  Continue coumadin , hematology follow up

## 2016-03-09 NOTE — Assessment & Plan Note (Signed)
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  Continue coumadin.

## 2016-03-09 NOTE — Assessment & Plan Note (Signed)
Annual comprehensive preventive exam was done as well as an evaluation and management of chronic conditions .  During the course of the visit the patient was educated and counseled about appropriate screening and preventive services including :  diabetes screening, lipid analysis with projected  10 year  risk for CAD , nutrition counseling, breast, cervical and colorectal cancer screening, and recommended immunizations.  Printed recommendations for health maintenance screenings was given 

## 2016-03-09 NOTE — Assessment & Plan Note (Signed)
CBC ordered; platelet ct and hgb normal.   Lab Results  Component Value Date   WBC 7.0 03/08/2016   HGB 15.1 (H) 03/08/2016   HCT 43.7 03/08/2016   MCV 91.1 03/08/2016   PLT 257.0 03/08/2016

## 2016-03-09 NOTE — Assessment & Plan Note (Signed)
Continue warfarin.  Deficits are minimal.

## 2016-03-09 NOTE — Assessment & Plan Note (Signed)
Embolic risk managed with warfarin. She has a PFO , history of atrial appendage thrombus, found during admission for CVA

## 2016-03-22 LAB — POCT INR: INR: 2.3

## 2016-03-23 ENCOUNTER — Ambulatory Visit (INDEPENDENT_AMBULATORY_CARE_PROVIDER_SITE_OTHER): Payer: Managed Care, Other (non HMO) | Admitting: Pharmacist

## 2016-03-23 DIAGNOSIS — I480 Paroxysmal atrial fibrillation: Secondary | ICD-10-CM

## 2016-03-23 DIAGNOSIS — Q211 Atrial septal defect: Secondary | ICD-10-CM

## 2016-03-23 DIAGNOSIS — Q2112 Patent foramen ovale: Secondary | ICD-10-CM

## 2016-03-23 DIAGNOSIS — I4892 Unspecified atrial flutter: Secondary | ICD-10-CM

## 2016-03-23 DIAGNOSIS — Z5181 Encounter for therapeutic drug level monitoring: Secondary | ICD-10-CM

## 2016-03-23 DIAGNOSIS — I6350 Cerebral infarction due to unspecified occlusion or stenosis of unspecified cerebral artery: Secondary | ICD-10-CM

## 2016-03-31 NOTE — Therapy (Signed)
[facility] [address] [place], [place], [address] Phone: [phone]   Fax:  [phone]  Patient Details  Name: Kendra [name] MRN: [id number] Date of Birth: 09-26-1945 Referring Provider:  No ref. provider found  Encounter Date: 03/31/2016  SPEECH THERAPY DISCHARGE SUMMARY  Visits from Start of Care: 9  Current functional level related to goals / functional outcomes: Pt did not arrive back to ST following her ninth visit. Goals at that time were: SLP Short Term Goals - 04/14/15 1204     SLP SHORT TERM GOAL #1    Title pt will write short notes with independence, given self-correction    Status Partially Met    SLP SHORT TERM GOAL #2    Title pt will engage in mod complex description tasks with 90% success and use of strategies    Status Achieved    SLP SHORT TERM GOAL #3    Title pt to demo simple conversation with use of compensations with rare min A    Status Achieved                   SLP Long Term Goals - 04/20/15 1656     SLP LONG TERM GOAL #1    Title pt to write short paragraphs (5-7 sentences) and ID errors independently    Time 5    Period Weeks    Status Deferred  pt now unconcerned about writing 04-20-15    SLP LONG TERM GOAL #2    Title pt will engage in 10 minutes mod complex conversation using compensations, with rare min A    Time 4    Period Weeks    Status On-going    SLP LONG TERM GOAL #3    Title pt will engage in 5 minutes complex conversation using compensations, with occasional min A    Time 4    Period Weeks    Status On-going         Remaining deficits: Assumed deficits remain.   Education / Equipment: Therapy course.  Plan: Patient agrees to discharge.  Patient goals were not met. Patient is being discharged due to not returning since the last visit.  ?????       [place] ,MS, CCC-SLP  03/31/2016, 1:13 PM  [facility] [address] [place], [place], [address] Phone: [phone]   Fax:  [phone]

## 2016-04-05 ENCOUNTER — Ambulatory Visit (INDEPENDENT_AMBULATORY_CARE_PROVIDER_SITE_OTHER): Payer: Managed Care, Other (non HMO) | Admitting: Internal Medicine

## 2016-04-05 DIAGNOSIS — I4892 Unspecified atrial flutter: Secondary | ICD-10-CM

## 2016-04-05 DIAGNOSIS — Z5181 Encounter for therapeutic drug level monitoring: Secondary | ICD-10-CM

## 2016-04-05 DIAGNOSIS — Q211 Atrial septal defect: Secondary | ICD-10-CM

## 2016-04-05 DIAGNOSIS — Q2112 Patent foramen ovale: Secondary | ICD-10-CM

## 2016-04-05 DIAGNOSIS — I480 Paroxysmal atrial fibrillation: Secondary | ICD-10-CM

## 2016-04-05 DIAGNOSIS — I6350 Cerebral infarction due to unspecified occlusion or stenosis of unspecified cerebral artery: Secondary | ICD-10-CM

## 2016-04-05 LAB — POCT INR: INR: 2.8

## 2016-04-06 DIAGNOSIS — H2511 Age-related nuclear cataract, right eye: Secondary | ICD-10-CM | POA: Diagnosis not present

## 2016-04-06 DIAGNOSIS — H25811 Combined forms of age-related cataract, right eye: Secondary | ICD-10-CM | POA: Diagnosis not present

## 2016-04-07 ENCOUNTER — Other Ambulatory Visit: Payer: Self-pay | Admitting: *Deleted

## 2016-04-07 MED ORDER — WARFARIN SODIUM 5 MG PO TABS: ORAL_TABLET | ORAL | 1 refills | Status: DC

## 2016-04-19 ENCOUNTER — Ambulatory Visit (INDEPENDENT_AMBULATORY_CARE_PROVIDER_SITE_OTHER): Payer: Managed Care, Other (non HMO) | Admitting: Pharmacist

## 2016-04-19 DIAGNOSIS — I6350 Cerebral infarction due to unspecified occlusion or stenosis of unspecified cerebral artery: Secondary | ICD-10-CM

## 2016-04-19 DIAGNOSIS — Z5181 Encounter for therapeutic drug level monitoring: Secondary | ICD-10-CM

## 2016-04-19 DIAGNOSIS — I480 Paroxysmal atrial fibrillation: Secondary | ICD-10-CM

## 2016-04-19 DIAGNOSIS — Q211 Atrial septal defect: Secondary | ICD-10-CM

## 2016-04-19 DIAGNOSIS — I4892 Unspecified atrial flutter: Secondary | ICD-10-CM

## 2016-04-19 DIAGNOSIS — Q2112 Patent foramen ovale: Secondary | ICD-10-CM

## 2016-04-19 LAB — POCT INR: INR: 2.4

## 2016-04-21 DIAGNOSIS — M8589 Other specified disorders of bone density and structure, multiple sites: Secondary | ICD-10-CM | POA: Diagnosis not present

## 2016-04-21 LAB — HM DEXA SCAN

## 2016-05-03 ENCOUNTER — Ambulatory Visit (INDEPENDENT_AMBULATORY_CARE_PROVIDER_SITE_OTHER): Payer: Managed Care, Other (non HMO) | Admitting: Interventional Cardiology

## 2016-05-03 DIAGNOSIS — I482 Chronic atrial fibrillation: Secondary | ICD-10-CM | POA: Diagnosis not present

## 2016-05-03 DIAGNOSIS — I6350 Cerebral infarction due to unspecified occlusion or stenosis of unspecified cerebral artery: Secondary | ICD-10-CM

## 2016-05-03 DIAGNOSIS — I480 Paroxysmal atrial fibrillation: Secondary | ICD-10-CM

## 2016-05-03 DIAGNOSIS — I4892 Unspecified atrial flutter: Secondary | ICD-10-CM

## 2016-05-03 DIAGNOSIS — Q211 Atrial septal defect: Secondary | ICD-10-CM

## 2016-05-03 DIAGNOSIS — Z7901 Long term (current) use of anticoagulants: Secondary | ICD-10-CM | POA: Diagnosis not present

## 2016-05-03 DIAGNOSIS — Q2112 Patent foramen ovale: Secondary | ICD-10-CM

## 2016-05-03 DIAGNOSIS — Z5181 Encounter for therapeutic drug level monitoring: Secondary | ICD-10-CM

## 2016-05-03 LAB — POCT INR: INR: 2.2

## 2016-05-07 ENCOUNTER — Telehealth: Payer: Self-pay | Admitting: Internal Medicine

## 2016-05-07 NOTE — Telephone Encounter (Signed)
There has been a significant change in bone density compared to 2006 .  We will discuss therapy at her next visit.

## 2016-05-08 NOTE — Telephone Encounter (Signed)
Spoke with pt she gave a verbal understanding.

## 2016-05-09 DIAGNOSIS — D1801 Hemangioma of skin and subcutaneous tissue: Secondary | ICD-10-CM | POA: Diagnosis not present

## 2016-05-09 DIAGNOSIS — L821 Other seborrheic keratosis: Secondary | ICD-10-CM | POA: Diagnosis not present

## 2016-05-09 DIAGNOSIS — L814 Other melanin hyperpigmentation: Secondary | ICD-10-CM | POA: Diagnosis not present

## 2016-05-09 DIAGNOSIS — D225 Melanocytic nevi of trunk: Secondary | ICD-10-CM | POA: Diagnosis not present

## 2016-05-09 DIAGNOSIS — L570 Actinic keratosis: Secondary | ICD-10-CM | POA: Diagnosis not present

## 2016-05-17 ENCOUNTER — Ambulatory Visit (INDEPENDENT_AMBULATORY_CARE_PROVIDER_SITE_OTHER): Payer: Managed Care, Other (non HMO) | Admitting: Cardiovascular Disease

## 2016-05-17 DIAGNOSIS — I6350 Cerebral infarction due to unspecified occlusion or stenosis of unspecified cerebral artery: Secondary | ICD-10-CM

## 2016-05-17 DIAGNOSIS — Q2112 Patent foramen ovale: Secondary | ICD-10-CM

## 2016-05-17 DIAGNOSIS — I480 Paroxysmal atrial fibrillation: Secondary | ICD-10-CM

## 2016-05-17 DIAGNOSIS — I4892 Unspecified atrial flutter: Secondary | ICD-10-CM

## 2016-05-17 DIAGNOSIS — Q211 Atrial septal defect: Secondary | ICD-10-CM

## 2016-05-17 DIAGNOSIS — Z5181 Encounter for therapeutic drug level monitoring: Secondary | ICD-10-CM

## 2016-05-17 LAB — POCT INR: INR: 3.9

## 2016-05-31 ENCOUNTER — Ambulatory Visit (INDEPENDENT_AMBULATORY_CARE_PROVIDER_SITE_OTHER): Payer: Self-pay | Admitting: Pharmacist

## 2016-05-31 DIAGNOSIS — Q2112 Patent foramen ovale: Secondary | ICD-10-CM

## 2016-05-31 DIAGNOSIS — Z5181 Encounter for therapeutic drug level monitoring: Secondary | ICD-10-CM

## 2016-05-31 DIAGNOSIS — Q211 Atrial septal defect: Secondary | ICD-10-CM

## 2016-05-31 DIAGNOSIS — I6350 Cerebral infarction due to unspecified occlusion or stenosis of unspecified cerebral artery: Secondary | ICD-10-CM

## 2016-05-31 DIAGNOSIS — I4892 Unspecified atrial flutter: Secondary | ICD-10-CM

## 2016-05-31 DIAGNOSIS — I480 Paroxysmal atrial fibrillation: Secondary | ICD-10-CM

## 2016-05-31 LAB — POCT INR: INR: 2.8

## 2016-06-02 ENCOUNTER — Encounter: Payer: Self-pay | Admitting: *Deleted

## 2016-06-14 ENCOUNTER — Ambulatory Visit (INDEPENDENT_AMBULATORY_CARE_PROVIDER_SITE_OTHER): Payer: Self-pay | Admitting: Internal Medicine

## 2016-06-14 DIAGNOSIS — Q211 Atrial septal defect: Secondary | ICD-10-CM

## 2016-06-14 DIAGNOSIS — I480 Paroxysmal atrial fibrillation: Secondary | ICD-10-CM

## 2016-06-14 DIAGNOSIS — Q2112 Patent foramen ovale: Secondary | ICD-10-CM

## 2016-06-14 DIAGNOSIS — I6350 Cerebral infarction due to unspecified occlusion or stenosis of unspecified cerebral artery: Secondary | ICD-10-CM

## 2016-06-14 DIAGNOSIS — Z5181 Encounter for therapeutic drug level monitoring: Secondary | ICD-10-CM

## 2016-06-14 DIAGNOSIS — I4892 Unspecified atrial flutter: Secondary | ICD-10-CM

## 2016-06-14 LAB — POCT INR: INR: 3.4

## 2016-06-20 ENCOUNTER — Ambulatory Visit (INDEPENDENT_AMBULATORY_CARE_PROVIDER_SITE_OTHER): Payer: Medicare Other | Admitting: Internal Medicine

## 2016-06-20 ENCOUNTER — Encounter: Payer: Self-pay | Admitting: Internal Medicine

## 2016-06-20 VITALS — BP 120/62 | HR 62 | Ht 67.5 in | Wt 148.0 lb

## 2016-06-20 DIAGNOSIS — I480 Paroxysmal atrial fibrillation: Secondary | ICD-10-CM

## 2016-06-20 DIAGNOSIS — I6350 Cerebral infarction due to unspecified occlusion or stenosis of unspecified cerebral artery: Secondary | ICD-10-CM

## 2016-06-20 NOTE — Patient Instructions (Addendum)
Medication Instructions:    Your physician recommends that you continue on your current medications as directed. Please refer to the Current Medication list given to you today.  Labwork:  None ordered  Testing/Procedures:  None ordered  Follow-Up:  Your physician wants you to follow-up in: 6 months with Dr. Lovena Le.  You will receive a reminder letter in the mail two months in advance. If you don't receive a letter, please call our office to schedule the follow-up appointment.  - If you need a refill on your cardiac medications before your next appointment, please call your pharmacy.   Thank you for choosing [facility]!!

## 2016-06-20 NOTE — Progress Notes (Signed)
HPI Kendra [name] returns today for followup. 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 She has a long h/o atrial arrhythmias which have been quiet clinically for many years. 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 She has noted worsening HTN. She was found to have a persistence of LAA thrombus on 2 different TEE's in the past few months. After much reflection and consultation by interventional cardiology, neurology and hematology, it was thought that her best option was using coumadin with a higher INR, and low dose ASA. The patient has almost a complete recovery from her stroke. She has seen Dr. Albertine Patricia in [place] for a second opinion regarding additional therapeutic options. The patient noted on my last visit that she had missed taking her blood thinner for several days prior to her stroke. She now feels well. She is interested in doing home INR monitoring of her coumadin. She has essentially recovered completely from her stroke with minimal deficits.  Allergies  Allergen Reactions  . Amiodarone Hcl Nausea Only and Rash     Current Outpatient Prescriptions  Medication Sig Dispense Refill  . amLODipine (NORVASC) 5 MG tablet Take 1 tablet (5 mg total) by mouth daily. 90 tablet 3  . aspirin 81 MG tablet Take 81 mg by mouth daily.    . hydrocortisone (ANUSOL-HC) 2.5 % rectal cream Place 1 application rectally 2 (two) times daily as needed (Use as directed).     . metoprolol succinate (TOPROL-XL) 100 MG 24 hr tablet Take 1 tablet (100 mg total) by mouth daily. Take with or immediately following a meal. 90 tablet 3  . Multiple Vitamin (MULTIVITAMIN) tablet Take 1 tablet by mouth daily.      Marland Kitchen warfarin (COUMADIN) 5 MG tablet TAKE AS DIRECTED BY  COUMADIN  CLINIC 90 tablet 1   No current facility-administered medications for this visit.      Past  Medical History:  Diagnosis Date  . Arthus phenomenon   . Atrial fibrillation ([place])   . Atrial flutter ([place])   . Chronic anticoagulation 12/14/2015  . 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 GERD (gastroesophageal reflux disease)   . Hemorrhoids   . Hx of blood clots   . Hypercholesterolemia   . 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 Stroke ([place])   . TIA (transient ischemic attack)    as per 07/30/00 note from [facility]     ROS:   All systems reviewed and negative except as noted in the HPI.   Past Surgical History:  Procedure Laterality Date  . ablasion    . APPENDECTOMY    . BREAST BIOPSY Right   . CARDIAC ELECTROPHYSIOLOGY STUDY AND ABLATION    . CESAREAN SECTION    . fibroid tumor removal  1996  . RADIOLOGY WITH ANESTHESIA N/A 02/16/2015   Procedure: RADIOLOGY WITH ANESTHESIA;  Surgeon: Medication Radiologist, MD;  Location: [facility];  Service: Radiology;  Laterality: N/A;  . TEE WITHOUT CARDIOVERSION N/A 03/08/2015   Procedure: TRANSESOPHAGEAL ECHOCARDIOGRAM (TEE);  Surgeon: [name], MD;  Location: [facility];  Service: Cardiovascular;  Laterality: N/A;  . TEE WITHOUT CARDIOVERSION N/A 04/28/2015   Procedure: TRANSESOPHAGEAL ECHOCARDIOGRAM (TEE);  Surgeon: [name], MD;  Location: [facility];  Service: Cardiovascular;  Laterality: N/A;     Family  History  Problem Relation Age of Onset  . Stroke Maternal Grandmother   . Alcohol abuse Father   . Lung cancer Father   . Other Brother     Emotional Illness  . Lung cancer Brother   . Colon cancer Neg Hx      Social History   Social History  . Marital status: Married    Spouse name: N/A  . Number of children: 4  . Years of education: N/A   Occupational History  . 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 Smoking status: Never Smoker  . Smokeless tobacco: Never Used  . Alcohol use No  . Drug use: No  . Sexual activity: Not on file    Other Topics Concern  . Not on file   Social History Narrative   ** Merged History Encounter **       Lives with husband   No caffeine drinks      BP 120/62   Pulse 62   Ht 5' 7.5" (1.715 m)   Wt 148 lb (67.1 kg)   BMI 22.84 kg/m   Physical Exam:  Well appearing 71 yo woman, NAD HEENT: Unremarkable Neck:  6 cm JVD, no thyromegally Lymphatics:  No adenopathy Back:  No CVA tenderness Lungs:  Clear HEART:  Regular rate rhythm, no murmurs, no rubs, no clicks Abd:  soft, positive bowel sounds, no organomegally, no rebound, no guarding Ext:  2 plus pulses, no edema, no cyanosis, no clubbing Skin:  No rashes no nodules Neuro:  CN II through XII intact, motor grossly intact  EKG - NSR   Assess/Plan: 1. Atrial fib - she has had no symptoms in nearly 10 years. She will continue her current meds. 2. HTN - her blood pressure is better after the addition of amlodipine. 3. Dyslipidemia - she will continue her statin therapy.  4. Coags - after additional reflection, she will continue on coumadin and low dose ASA.  Mikle Bosworth.D.

## 2016-06-27 IMAGING — MR MR HEAD W/O CM
9 of 12 series · 31 of 48 positions shown · non-contrast
Comparison: CT head February 16, 2015

CLINICAL DATA: Follow-up LEFT MCA infarct, status post
intervention.

EXAM:
MRI HEAD WITHOUT CONTRAST
MRA HEAD WITHOUT CONTRAST
TECHNIQUE: Multiplanar, multiecho pulse sequences of the brain and surrounding
structures were obtained without intravenous contrast. Angiographic
images of the head were obtained using MRA technique without
contrast.

[Series 3: T1 · sagittal · 5.0mm · 0.47mm/px · 1 of 23 slices shown]
[im 1/23]
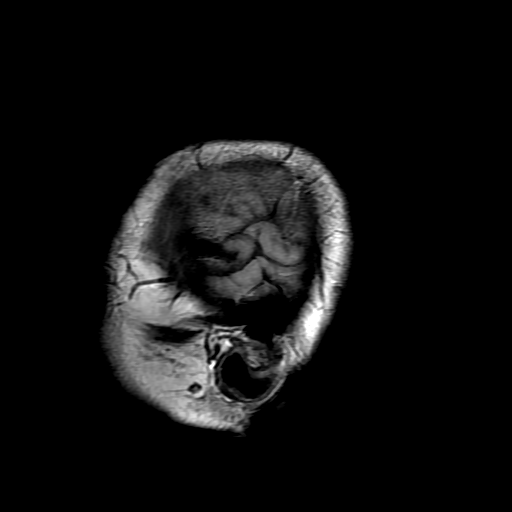

[Series 4: DWI · axial · 3.0mm · 1.09mm/px · z∈[-57,+87]mm · 7 of 98 slices shown (1 of 4)]
[im 1/98]
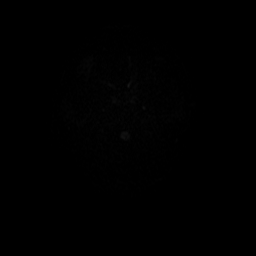
[im 17/98]
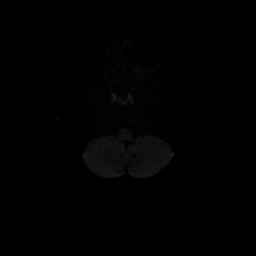
[im 33/98]
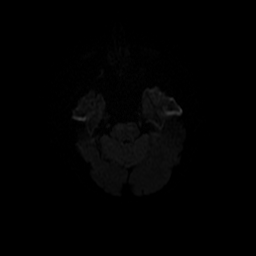
[im 49/98]
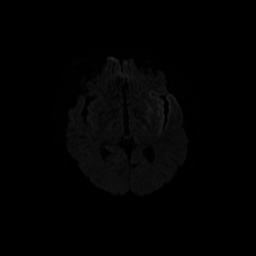
[im 65/98]
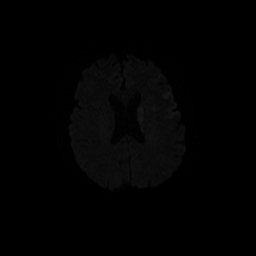
[im 81/98]
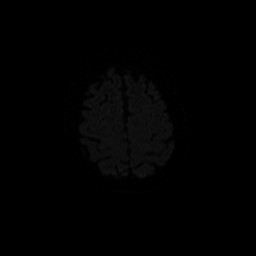
[im 98/98]
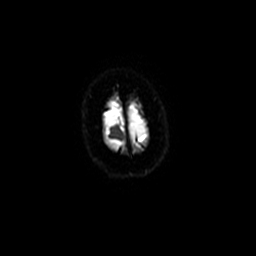

[Series 5: T2 · axial · 5.0mm · 0.43mm/px · z∈[-60,+96]mm · 2 of 27 slices shown (1 of 2)]
[im 1/27]
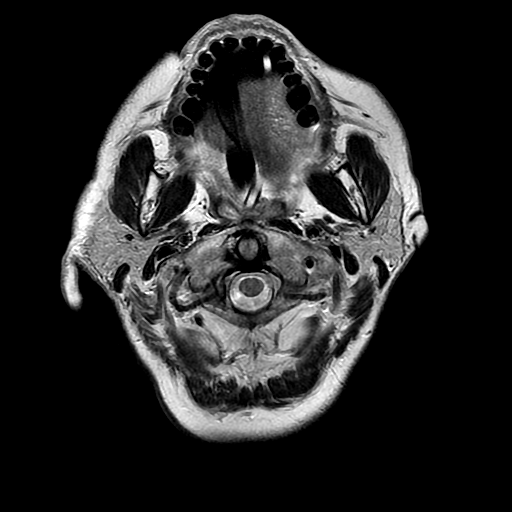
[im 27/27]
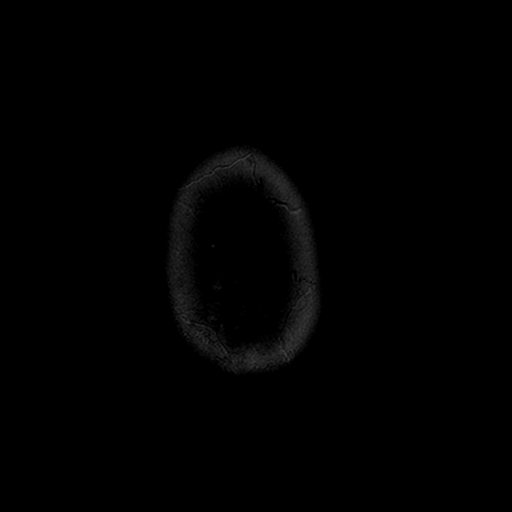

[Series 6: DWI · coronal · 5.0mm · 1.09mm/px · 5 of 66 slices shown (2 of 4)]
[im 1/66]
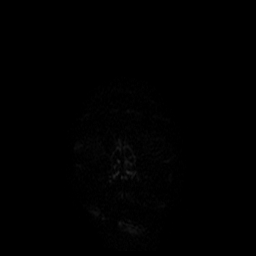
[im 17/66]
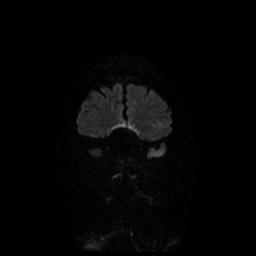
[im 33/66]
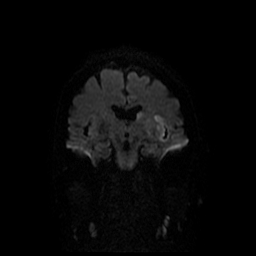
[im 49/66]
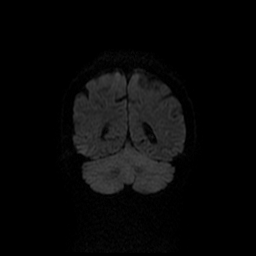
[im 66/66]
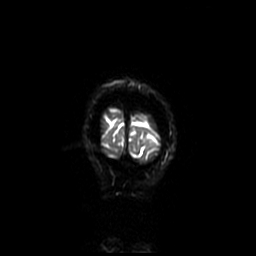

[Series 7: FLAIR · axial · 5.0mm · 0.43mm/px · z∈[-60,+96]mm · 2 of 27 slices shown]
[im 1/27]
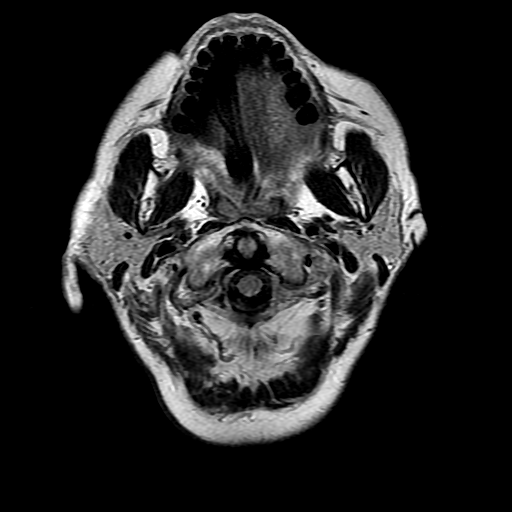
[im 27/27]
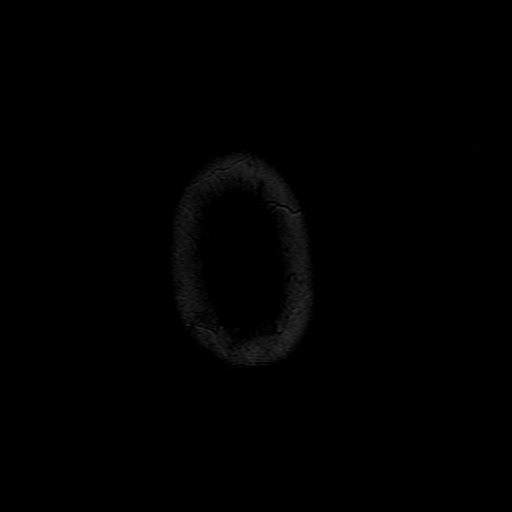

[Series 8: (id) mt fs · axial · 1.4mm · 0.43mm/px · z∈[-55,+1]mm · 5 of 136 slices shown]
[im 1/136]
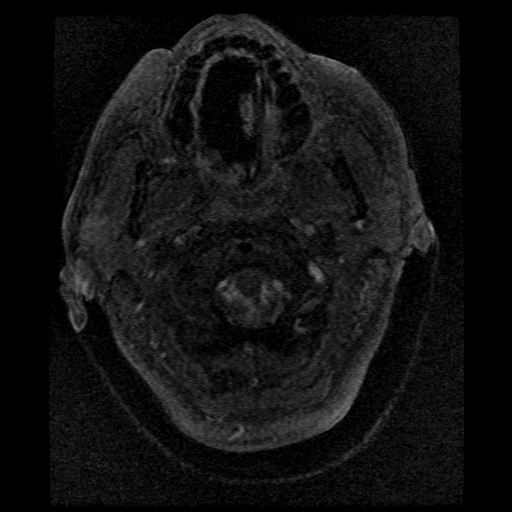
[im 28/136]
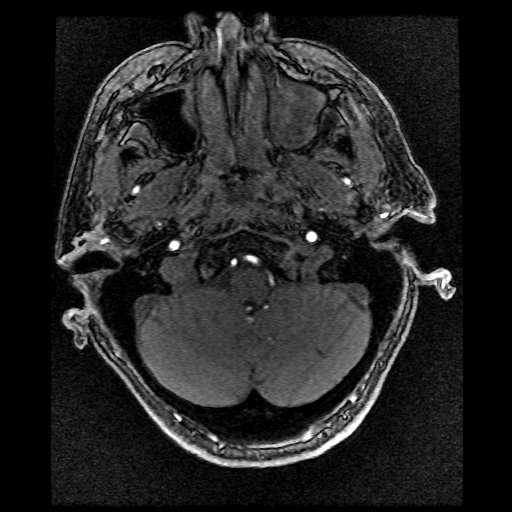
[im 41/136]
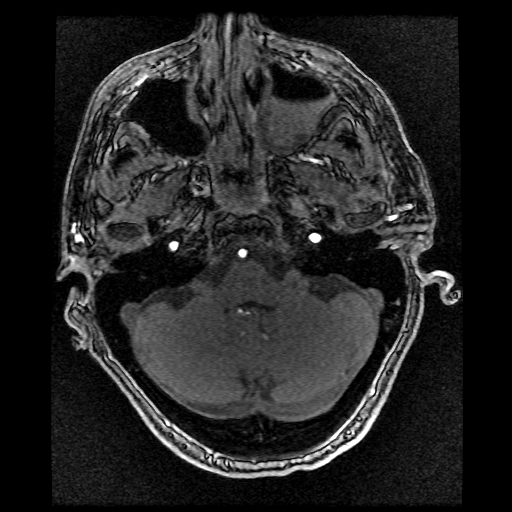
[im 55/136]
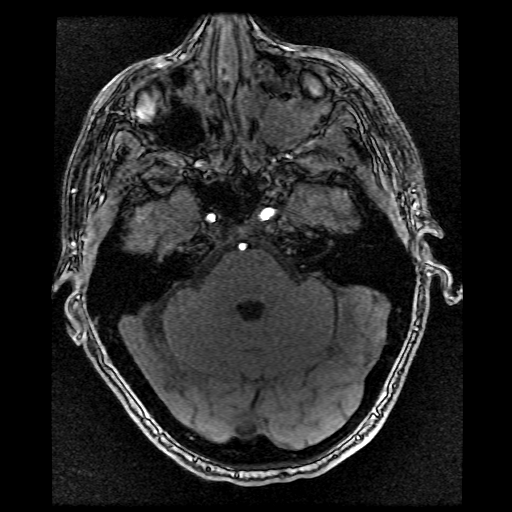
[im 82/136]
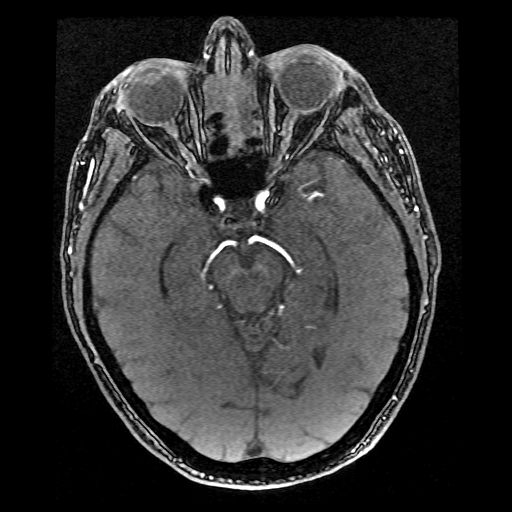

[Series 11: T2 · coronal · 5.0mm · 0.43mm/px · 2 of 29 slices shown (2 of 2)]
[im 1/29]
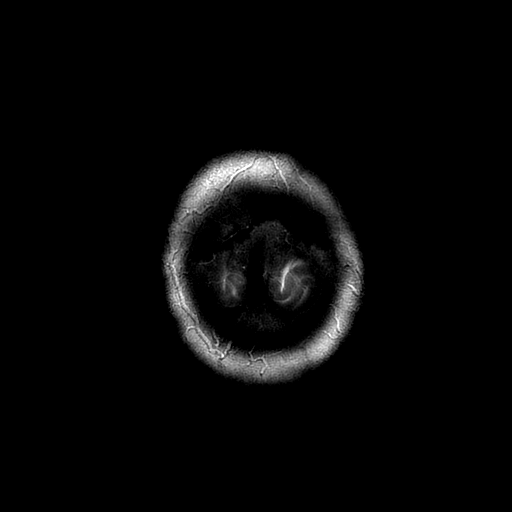
[im 29/29]
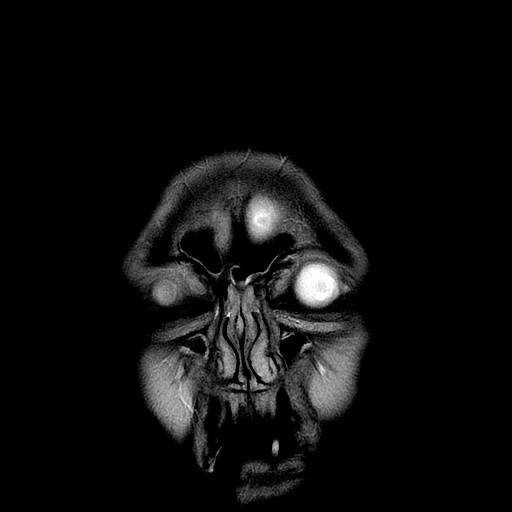

[Series 400: DWI · axial · 3.0mm · 1.09mm/px · z∈[-57,+87]mm · 4 of 49 slices shown (3 of 4)]
[im 1/49]
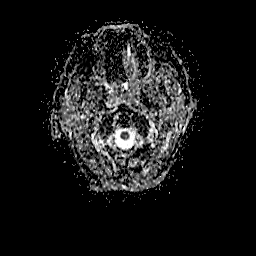
[im 17/49]
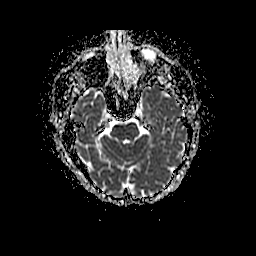
[im 33/49]
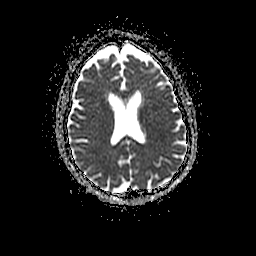
[im 49/49]
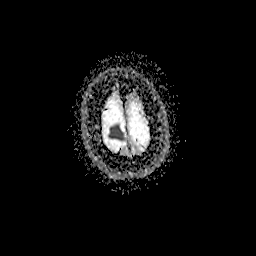

[Series 600: DWI · coronal · 5.0mm · 1.09mm/px · 3 of 33 slices shown (4 of 4)]
[im 1/33]
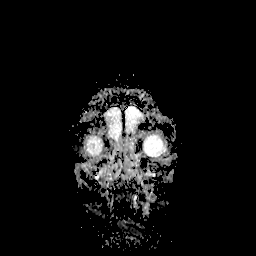
[im 17/33]
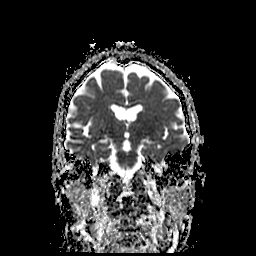
[im 33/33]
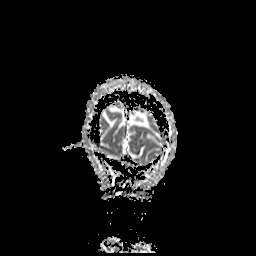

[31 of 48 positions shown; findings below may reference images not displayed]

FINDINGS: MRI HEAD FINDINGS

Reduced diffusion within LEFT basal ganglia, faint reduced diffusion
LEFT frontal cortex and insula, punctate foci of reduced diffusion
LEFT mesial temporal lobe. Areas demonstrate low ADC values. No
susceptibility artifact to suggest hemorrhage. No midline shift,
mass effect or mass lesions. Ventricles and sulci are normal for
patient's age. Scattered supratentorial and pontine white matter
subcentimeter T2 hyperintensities compatible with mild chronic small
vessel ischemic disease, less than expected for age. Old small LEFT
cerebellar infarct.

No abnormal extra-axial fluid collections. Status post LEFT ocular
lens implant. Moderate to severe paranasal sinusitis with
life-support lines in place. Mastoid air cells are well aerated. No
abnormal sellar expansion. No cerebellar tonsillar ectopia. No
suspicious calvarial bone marrow signal.

MRA HEAD FINDINGS

Anterior circulation: Normal flow related enhancement of the
included cervical, petrous, cavernous and supraclinoid internal
carotid arteries. Patent anterior communicating artery. Normal flow
related enhancement of the anterior and middle cerebral arteries,
including distal segments. Supernumerary anterior cerebral artery
arising from RIGHT A1-2 junction.

No large vessel occlusion, high-grade stenosis, abnormal luminal
irregularity, aneurysm.

Posterior circulation: Codominant vertebral arteries. Basilar artery
is patent, with normal flow related enhancement of the main branch
vessels. Normal flow related enhancement of the posterior cerebral
arteries.

No large vessel occlusion, high-grade stenosis, abnormal luminal
irregularity, aneurysm.
IMPRESSION: MRI HEAD: Patchy areas of acute ischemia within LEFT MCA territory,
no hemorrhagic conversion.

Mild chronic small vessel ischemic disease. Old small LEFT
cerebellar infarct.

MRA HEAD: Negative, widely patent LEFT MCA.

## 2016-06-28 ENCOUNTER — Ambulatory Visit (INDEPENDENT_AMBULATORY_CARE_PROVIDER_SITE_OTHER): Payer: Medicare Other | Admitting: Pharmacist

## 2016-06-28 DIAGNOSIS — Z7901 Long term (current) use of anticoagulants: Secondary | ICD-10-CM | POA: Diagnosis not present

## 2016-06-28 DIAGNOSIS — I6350 Cerebral infarction due to unspecified occlusion or stenosis of unspecified cerebral artery: Secondary | ICD-10-CM

## 2016-06-28 DIAGNOSIS — I480 Paroxysmal atrial fibrillation: Secondary | ICD-10-CM

## 2016-06-28 DIAGNOSIS — Q211 Atrial septal defect: Secondary | ICD-10-CM

## 2016-06-28 DIAGNOSIS — I4892 Unspecified atrial flutter: Secondary | ICD-10-CM

## 2016-06-28 DIAGNOSIS — Z5181 Encounter for therapeutic drug level monitoring: Secondary | ICD-10-CM

## 2016-06-28 DIAGNOSIS — Q2112 Patent foramen ovale: Secondary | ICD-10-CM

## 2016-06-28 DIAGNOSIS — I482 Chronic atrial fibrillation: Secondary | ICD-10-CM | POA: Diagnosis not present

## 2016-06-28 LAB — POCT INR: INR: 3.5

## 2016-07-12 ENCOUNTER — Ambulatory Visit (INDEPENDENT_AMBULATORY_CARE_PROVIDER_SITE_OTHER): Payer: Medicare Other | Admitting: Cardiovascular Disease

## 2016-07-12 DIAGNOSIS — Z5181 Encounter for therapeutic drug level monitoring: Secondary | ICD-10-CM

## 2016-07-12 DIAGNOSIS — I480 Paroxysmal atrial fibrillation: Secondary | ICD-10-CM

## 2016-07-12 DIAGNOSIS — Q211 Atrial septal defect: Secondary | ICD-10-CM

## 2016-07-12 DIAGNOSIS — I4892 Unspecified atrial flutter: Secondary | ICD-10-CM

## 2016-07-12 DIAGNOSIS — I6350 Cerebral infarction due to unspecified occlusion or stenosis of unspecified cerebral artery: Secondary | ICD-10-CM

## 2016-07-12 DIAGNOSIS — Q2112 Patent foramen ovale: Secondary | ICD-10-CM

## 2016-07-12 LAB — POCT INR: INR: 3.5

## 2016-07-26 ENCOUNTER — Ambulatory Visit (INDEPENDENT_AMBULATORY_CARE_PROVIDER_SITE_OTHER): Payer: Medicare Other | Admitting: Pharmacist

## 2016-07-26 DIAGNOSIS — I480 Paroxysmal atrial fibrillation: Secondary | ICD-10-CM

## 2016-07-26 DIAGNOSIS — Q211 Atrial septal defect: Secondary | ICD-10-CM

## 2016-07-26 DIAGNOSIS — I6350 Cerebral infarction due to unspecified occlusion or stenosis of unspecified cerebral artery: Secondary | ICD-10-CM

## 2016-07-26 DIAGNOSIS — Z5181 Encounter for therapeutic drug level monitoring: Secondary | ICD-10-CM

## 2016-07-26 DIAGNOSIS — Q2112 Patent foramen ovale: Secondary | ICD-10-CM

## 2016-07-26 DIAGNOSIS — I4892 Unspecified atrial flutter: Secondary | ICD-10-CM

## 2016-07-26 LAB — POCT INR: INR: 2.9

## 2016-08-09 ENCOUNTER — Ambulatory Visit (INDEPENDENT_AMBULATORY_CARE_PROVIDER_SITE_OTHER): Payer: Medicare Other | Admitting: Cardiovascular Disease

## 2016-08-09 DIAGNOSIS — I6350 Cerebral infarction due to unspecified occlusion or stenosis of unspecified cerebral artery: Secondary | ICD-10-CM

## 2016-08-09 DIAGNOSIS — Z5181 Encounter for therapeutic drug level monitoring: Secondary | ICD-10-CM

## 2016-08-09 DIAGNOSIS — Q211 Atrial septal defect: Secondary | ICD-10-CM

## 2016-08-09 DIAGNOSIS — I480 Paroxysmal atrial fibrillation: Secondary | ICD-10-CM

## 2016-08-09 DIAGNOSIS — I4892 Unspecified atrial flutter: Secondary | ICD-10-CM

## 2016-08-09 DIAGNOSIS — Q2112 Patent foramen ovale: Secondary | ICD-10-CM

## 2016-08-09 LAB — POCT INR: INR: 3.4

## 2016-08-23 ENCOUNTER — Ambulatory Visit (INDEPENDENT_AMBULATORY_CARE_PROVIDER_SITE_OTHER): Payer: Medicare Other | Admitting: Cardiology

## 2016-08-23 DIAGNOSIS — Q2112 Patent foramen ovale: Secondary | ICD-10-CM

## 2016-08-23 DIAGNOSIS — Q211 Atrial septal defect: Secondary | ICD-10-CM

## 2016-08-23 DIAGNOSIS — Z5181 Encounter for therapeutic drug level monitoring: Secondary | ICD-10-CM

## 2016-08-23 DIAGNOSIS — I6350 Cerebral infarction due to unspecified occlusion or stenosis of unspecified cerebral artery: Secondary | ICD-10-CM

## 2016-08-23 DIAGNOSIS — I480 Paroxysmal atrial fibrillation: Secondary | ICD-10-CM

## 2016-08-23 DIAGNOSIS — I4892 Unspecified atrial flutter: Secondary | ICD-10-CM

## 2016-08-23 DIAGNOSIS — Z7901 Long term (current) use of anticoagulants: Secondary | ICD-10-CM | POA: Diagnosis not present

## 2016-08-23 DIAGNOSIS — I482 Chronic atrial fibrillation: Secondary | ICD-10-CM | POA: Diagnosis not present

## 2016-08-23 LAB — POCT INR: INR: 3

## 2016-09-06 ENCOUNTER — Ambulatory Visit (INDEPENDENT_AMBULATORY_CARE_PROVIDER_SITE_OTHER): Payer: Medicare Other | Admitting: Pharmacist

## 2016-09-06 DIAGNOSIS — Q2112 Patent foramen ovale: Secondary | ICD-10-CM

## 2016-09-06 DIAGNOSIS — Z5181 Encounter for therapeutic drug level monitoring: Secondary | ICD-10-CM

## 2016-09-06 DIAGNOSIS — I4892 Unspecified atrial flutter: Secondary | ICD-10-CM

## 2016-09-06 DIAGNOSIS — I6350 Cerebral infarction due to unspecified occlusion or stenosis of unspecified cerebral artery: Secondary | ICD-10-CM

## 2016-09-06 DIAGNOSIS — Q211 Atrial septal defect: Secondary | ICD-10-CM

## 2016-09-06 DIAGNOSIS — I480 Paroxysmal atrial fibrillation: Secondary | ICD-10-CM

## 2016-09-06 LAB — POCT INR: INR: 3.4

## 2016-09-20 ENCOUNTER — Ambulatory Visit (INDEPENDENT_AMBULATORY_CARE_PROVIDER_SITE_OTHER): Payer: Self-pay | Admitting: Internal Medicine

## 2016-09-20 DIAGNOSIS — Z5181 Encounter for therapeutic drug level monitoring: Secondary | ICD-10-CM

## 2016-09-20 DIAGNOSIS — I4892 Unspecified atrial flutter: Secondary | ICD-10-CM

## 2016-09-20 DIAGNOSIS — Q2112 Patent foramen ovale: Secondary | ICD-10-CM

## 2016-09-20 DIAGNOSIS — Q211 Atrial septal defect: Secondary | ICD-10-CM

## 2016-09-20 DIAGNOSIS — I6350 Cerebral infarction due to unspecified occlusion or stenosis of unspecified cerebral artery: Secondary | ICD-10-CM

## 2016-09-20 DIAGNOSIS — Z7901 Long term (current) use of anticoagulants: Secondary | ICD-10-CM | POA: Diagnosis not present

## 2016-09-20 DIAGNOSIS — I482 Chronic atrial fibrillation: Secondary | ICD-10-CM | POA: Diagnosis not present

## 2016-09-20 DIAGNOSIS — I480 Paroxysmal atrial fibrillation: Secondary | ICD-10-CM

## 2016-09-20 LAB — POCT INR: INR: 2.5

## 2016-09-25 ENCOUNTER — Other Ambulatory Visit: Payer: Self-pay | Admitting: Cardiovascular Disease

## 2016-10-04 ENCOUNTER — Ambulatory Visit (INDEPENDENT_AMBULATORY_CARE_PROVIDER_SITE_OTHER): Payer: Medicare Other | Admitting: Cardiovascular Disease

## 2016-10-04 DIAGNOSIS — I6350 Cerebral infarction due to unspecified occlusion or stenosis of unspecified cerebral artery: Secondary | ICD-10-CM

## 2016-10-04 DIAGNOSIS — Q211 Atrial septal defect: Secondary | ICD-10-CM

## 2016-10-04 DIAGNOSIS — I4892 Unspecified atrial flutter: Secondary | ICD-10-CM

## 2016-10-04 DIAGNOSIS — I480 Paroxysmal atrial fibrillation: Secondary | ICD-10-CM

## 2016-10-04 DIAGNOSIS — Q2112 Patent foramen ovale: Secondary | ICD-10-CM

## 2016-10-04 DIAGNOSIS — Z5181 Encounter for therapeutic drug level monitoring: Secondary | ICD-10-CM

## 2016-10-04 LAB — POCT INR: INR: 3.8

## 2016-10-18 ENCOUNTER — Ambulatory Visit (INDEPENDENT_AMBULATORY_CARE_PROVIDER_SITE_OTHER): Payer: Self-pay | Admitting: Internal Medicine

## 2016-10-18 DIAGNOSIS — Z5181 Encounter for therapeutic drug level monitoring: Secondary | ICD-10-CM

## 2016-10-18 DIAGNOSIS — Q2112 Patent foramen ovale: Secondary | ICD-10-CM

## 2016-10-18 DIAGNOSIS — I4892 Unspecified atrial flutter: Secondary | ICD-10-CM

## 2016-10-18 DIAGNOSIS — I480 Paroxysmal atrial fibrillation: Secondary | ICD-10-CM

## 2016-10-18 DIAGNOSIS — I6350 Cerebral infarction due to unspecified occlusion or stenosis of unspecified cerebral artery: Secondary | ICD-10-CM

## 2016-10-18 DIAGNOSIS — Q211 Atrial septal defect: Secondary | ICD-10-CM

## 2016-10-18 LAB — POCT INR: INR: 4.7

## 2016-10-20 ENCOUNTER — Encounter: Payer: Self-pay | Admitting: Pharmacist

## 2016-10-24 ENCOUNTER — Ambulatory Visit (INDEPENDENT_AMBULATORY_CARE_PROVIDER_SITE_OTHER): Payer: Medicare Other | Admitting: Cardiology

## 2016-10-24 DIAGNOSIS — Q2112 Patent foramen ovale: Secondary | ICD-10-CM

## 2016-10-24 DIAGNOSIS — I6350 Cerebral infarction due to unspecified occlusion or stenosis of unspecified cerebral artery: Secondary | ICD-10-CM

## 2016-10-24 DIAGNOSIS — I482 Chronic atrial fibrillation: Secondary | ICD-10-CM | POA: Diagnosis not present

## 2016-10-24 DIAGNOSIS — I4892 Unspecified atrial flutter: Secondary | ICD-10-CM

## 2016-10-24 DIAGNOSIS — Z7901 Long term (current) use of anticoagulants: Secondary | ICD-10-CM | POA: Diagnosis not present

## 2016-10-24 DIAGNOSIS — I480 Paroxysmal atrial fibrillation: Secondary | ICD-10-CM

## 2016-10-24 DIAGNOSIS — Z5181 Encounter for therapeutic drug level monitoring: Secondary | ICD-10-CM

## 2016-10-24 DIAGNOSIS — Q211 Atrial septal defect: Secondary | ICD-10-CM

## 2016-10-24 LAB — POCT INR: INR: 2.7

## 2016-11-08 ENCOUNTER — Ambulatory Visit (INDEPENDENT_AMBULATORY_CARE_PROVIDER_SITE_OTHER): Payer: Medicare Other | Admitting: Interventional Cardiology

## 2016-11-08 DIAGNOSIS — I480 Paroxysmal atrial fibrillation: Secondary | ICD-10-CM

## 2016-11-08 DIAGNOSIS — Q2112 Patent foramen ovale: Secondary | ICD-10-CM

## 2016-11-08 DIAGNOSIS — I4892 Unspecified atrial flutter: Secondary | ICD-10-CM

## 2016-11-08 DIAGNOSIS — Q211 Atrial septal defect: Secondary | ICD-10-CM

## 2016-11-08 DIAGNOSIS — I6350 Cerebral infarction due to unspecified occlusion or stenosis of unspecified cerebral artery: Secondary | ICD-10-CM

## 2016-11-08 DIAGNOSIS — Z5181 Encounter for therapeutic drug level monitoring: Secondary | ICD-10-CM

## 2016-11-08 LAB — POCT INR: INR: 2.9

## 2016-11-15 DIAGNOSIS — Z23 Encounter for immunization: Secondary | ICD-10-CM | POA: Diagnosis not present

## 2016-11-22 ENCOUNTER — Ambulatory Visit (INDEPENDENT_AMBULATORY_CARE_PROVIDER_SITE_OTHER): Payer: Medicare Other | Admitting: Internal Medicine

## 2016-11-22 DIAGNOSIS — Z5181 Encounter for therapeutic drug level monitoring: Secondary | ICD-10-CM

## 2016-11-22 DIAGNOSIS — I6350 Cerebral infarction due to unspecified occlusion or stenosis of unspecified cerebral artery: Secondary | ICD-10-CM | POA: Diagnosis not present

## 2016-11-22 DIAGNOSIS — Q211 Atrial septal defect: Secondary | ICD-10-CM

## 2016-11-22 DIAGNOSIS — I4892 Unspecified atrial flutter: Secondary | ICD-10-CM | POA: Diagnosis not present

## 2016-11-22 DIAGNOSIS — I480 Paroxysmal atrial fibrillation: Secondary | ICD-10-CM

## 2016-11-22 DIAGNOSIS — Q2112 Patent foramen ovale: Secondary | ICD-10-CM

## 2016-11-22 LAB — POCT INR: INR: 3.9

## 2016-11-23 NOTE — Telephone Encounter (Signed)
Error

## 2016-12-06 ENCOUNTER — Ambulatory Visit (INDEPENDENT_AMBULATORY_CARE_PROVIDER_SITE_OTHER): Payer: Medicare Other

## 2016-12-06 DIAGNOSIS — I482 Chronic atrial fibrillation: Secondary | ICD-10-CM | POA: Diagnosis not present

## 2016-12-06 DIAGNOSIS — I4892 Unspecified atrial flutter: Secondary | ICD-10-CM

## 2016-12-06 DIAGNOSIS — Q2112 Patent foramen ovale: Secondary | ICD-10-CM

## 2016-12-06 DIAGNOSIS — I480 Paroxysmal atrial fibrillation: Secondary | ICD-10-CM

## 2016-12-06 DIAGNOSIS — Q211 Atrial septal defect: Secondary | ICD-10-CM

## 2016-12-06 DIAGNOSIS — I6350 Cerebral infarction due to unspecified occlusion or stenosis of unspecified cerebral artery: Secondary | ICD-10-CM | POA: Diagnosis not present

## 2016-12-06 DIAGNOSIS — Z5181 Encounter for therapeutic drug level monitoring: Secondary | ICD-10-CM | POA: Diagnosis not present

## 2016-12-06 DIAGNOSIS — Z7901 Long term (current) use of anticoagulants: Secondary | ICD-10-CM | POA: Diagnosis not present

## 2016-12-06 LAB — POCT INR: INR: 2.2

## 2016-12-20 ENCOUNTER — Ambulatory Visit (INDEPENDENT_AMBULATORY_CARE_PROVIDER_SITE_OTHER): Payer: Medicare Other | Admitting: Cardiology

## 2016-12-20 DIAGNOSIS — I63132 Cerebral infarction due to embolism of left carotid artery: Secondary | ICD-10-CM | POA: Diagnosis not present

## 2016-12-20 DIAGNOSIS — I480 Paroxysmal atrial fibrillation: Secondary | ICD-10-CM

## 2016-12-20 DIAGNOSIS — I6350 Cerebral infarction due to unspecified occlusion or stenosis of unspecified cerebral artery: Secondary | ICD-10-CM | POA: Diagnosis not present

## 2016-12-20 DIAGNOSIS — Z5181 Encounter for therapeutic drug level monitoring: Secondary | ICD-10-CM | POA: Diagnosis not present

## 2016-12-20 DIAGNOSIS — Q211 Atrial septal defect: Secondary | ICD-10-CM

## 2016-12-20 DIAGNOSIS — Q2112 Patent foramen ovale: Secondary | ICD-10-CM

## 2016-12-20 DIAGNOSIS — I4892 Unspecified atrial flutter: Secondary | ICD-10-CM

## 2016-12-20 LAB — POCT INR: INR: 2.7

## 2016-12-22 ENCOUNTER — Ambulatory Visit (INDEPENDENT_AMBULATORY_CARE_PROVIDER_SITE_OTHER): Payer: Medicare Other | Admitting: Internal Medicine

## 2016-12-22 ENCOUNTER — Encounter: Payer: Self-pay | Admitting: Internal Medicine

## 2016-12-22 VITALS — BP 112/62 | HR 62 | Resp 16 | Ht 67.5 in | Wt 150.0 lb

## 2016-12-22 DIAGNOSIS — I10 Essential (primary) hypertension: Secondary | ICD-10-CM

## 2016-12-22 DIAGNOSIS — I4891 Unspecified atrial fibrillation: Secondary | ICD-10-CM | POA: Diagnosis not present

## 2016-12-22 DIAGNOSIS — I6350 Cerebral infarction due to unspecified occlusion or stenosis of unspecified cerebral artery: Secondary | ICD-10-CM

## 2016-12-22 NOTE — Progress Notes (Signed)
HPI Kendra [name] returns today for followup. She is a pleasant 71 yo woman with a h/o HTN, remote atrial fib/flutter, and recurrent thromboembolic complications, who was placed back on warfarin therapy. In the interim she has been stable. She has noncardiac chest pain. She has not had syncope. She exercises regularly with no symptoms and no limitation. Allergies  Allergen Reactions  . Amiodarone Hcl Nausea Only and Rash     Current Outpatient Prescriptions  Medication Sig Dispense Refill  . amLODipine (NORVASC) 5 MG tablet Take 1 tablet (5 mg total) by mouth daily. 90 tablet 3  . aspirin 81 MG tablet Take 81 mg by mouth daily.    . metoprolol succinate (TOPROL-XL) 100 MG 24 hr tablet Take 1 tablet (100 mg total) by mouth daily. Take with or immediately following a meal. 90 tablet 3  . Multiple Vitamin (MULTIVITAMIN) tablet Take 1 tablet by mouth daily.      Marland Kitchen warfarin (COUMADIN) 5 MG tablet TAKE AS DIRECTED BY COUMADIN CLINIC 90 tablet 1   No current facility-administered medications for this visit.      Past Medical History:  Diagnosis Date  . Arthus phenomenon   . Atrial fibrillation ([place])   . Atrial flutter ([place])   . Chronic anticoagulation 12/14/2015  . 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 GERD (gastroesophageal reflux disease)   . Hemorrhoids   . Hx of blood clots   . Hypercholesterolemia   . 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 Stroke ([place])   . TIA (transient ischemic attack)    as per 07/30/00 note from [facility]     ROS:   All systems reviewed and negative except as noted in the HPI.   Past Surgical History:  Procedure Laterality Date  . ablasion    . APPENDECTOMY    . BREAST BIOPSY Right   . CARDIAC ELECTROPHYSIOLOGY STUDY AND ABLATION    . CESAREAN SECTION    . fibroid tumor removal  1996  . RADIOLOGY WITH ANESTHESIA N/A 02/16/2015   Procedure: RADIOLOGY WITH ANESTHESIA;  Surgeon: Medication Radiologist, MD;   Location: [place] [facility];  Service: Radiology;  Laterality: N/A;  . TEE WITHOUT CARDIOVERSION N/A 03/08/2015   Procedure: TRANSESOPHAGEAL ECHOCARDIOGRAM (TEE);  Surgeon: [name], MD;  Location: [facility];  Service: Cardiovascular;  Laterality: N/A;  . TEE WITHOUT CARDIOVERSION N/A 04/28/2015   Procedure: TRANSESOPHAGEAL ECHOCARDIOGRAM (TEE);  Surgeon: [name], MD;  Location: [facility];  Service: Cardiovascular;  Laterality: N/A;     Family History  Problem Relation Age of Onset  . Stroke Maternal Grandmother   . Alcohol abuse Father   . Lung cancer Father   . Other Brother        Emotional Illness  . Lung cancer Brother   . Colon cancer Neg Hx      Social History   Social History  . Marital status: Married    Spouse name: N/A  . Number of children: 4  . Years of education: N/A   Occupational History  . 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 Smoking status: Never Smoker  . Smokeless tobacco: Never Used  . Alcohol use No  . Drug use: No  . Sexual activity: Not on file   Other Topics Concern  . Not on file   Social History Narrative   ** Merged History Encounter **  Lives with husband   No caffeine drinks      BP 112/62   Pulse 62   Resp 16   Ht 5' 7.5" (1.715 m)   Wt 150 lb (68 kg)   SpO2 98%   BMI 23.15 kg/m   Physical Exam:  Well appearing 71 year old woman, NAD HEENT: Unremarkable Neck:  No JVD, no thyromegally Lymphatics:  No adenopathy Back:  No CVA tenderness Lungs:  Clear, with no wheezes, rales, or rhonchi. HEART:  Regular rate rhythm, no murmurs, no rubs, no clicks Abd:  soft, positive bowel sounds, no organomegally, no rebound, no guarding Ext:  2 plus pulses, no edema, no cyanosis, no clubbing Skin:  No rashes no nodules Neuro:  CN II through XII intact, motor grossly intact  EKG - normal sinus rhythm with right axis deviation and poor R-wave progression   Assess/Plan: 1. Recurrent embolic  complications status post stroke - she is doing well and is maintaining therapeutic INR and is on warfarin.  2. Noncardiac chest pain - her pain is centralized just underneath her left chest. It is not related to exertion. She is able to exercise with no limitation. I reassured the patient and asked her to undergo watchful waiting. 3. Hypertension - her blood pressure is well controlled on beta blocker therapy. No change in medications. 4. Paroxysmal atrial fibrillation - she has had no symptomatic atrial arrhythmias. No change in medical therapy.

## 2016-12-22 NOTE — Patient Instructions (Addendum)

## 2016-12-28 ENCOUNTER — Other Ambulatory Visit: Payer: Self-pay | Admitting: Internal Medicine

## 2016-12-28 DIAGNOSIS — I119 Hypertensive heart disease without heart failure: Secondary | ICD-10-CM

## 2017-01-03 ENCOUNTER — Telehealth: Payer: Self-pay | Admitting: *Deleted

## 2017-01-03 DIAGNOSIS — Z7901 Long term (current) use of anticoagulants: Secondary | ICD-10-CM | POA: Diagnosis not present

## 2017-01-03 LAB — PROTIME-INR: INR: 2.8 — AB (ref 0.9–1.1)

## 2017-01-03 NOTE — Telephone Encounter (Signed)
Spoke with pt and her coumadin strips are on the recall list Instructed to call [facility]  and see if can overnight strips Pt called back and said [facility] really did not know when can get her strips so sent prescription for her to have INR checked at lab in [facility] and she states understanding  The INR results of 01/03/2017  2.9 will not be used to dose her coumadin as it was obtained per recall strip

## 2017-01-05 ENCOUNTER — Ambulatory Visit (INDEPENDENT_AMBULATORY_CARE_PROVIDER_SITE_OTHER): Payer: Medicare Other | Admitting: Cardiovascular Disease

## 2017-01-05 DIAGNOSIS — Q2112 Patent foramen ovale: Secondary | ICD-10-CM

## 2017-01-05 DIAGNOSIS — I63132 Cerebral infarction due to embolism of left carotid artery: Secondary | ICD-10-CM

## 2017-01-05 DIAGNOSIS — I480 Paroxysmal atrial fibrillation: Secondary | ICD-10-CM | POA: Diagnosis not present

## 2017-01-05 DIAGNOSIS — I6350 Cerebral infarction due to unspecified occlusion or stenosis of unspecified cerebral artery: Secondary | ICD-10-CM

## 2017-01-05 DIAGNOSIS — I4892 Unspecified atrial flutter: Secondary | ICD-10-CM

## 2017-01-05 DIAGNOSIS — Z5181 Encounter for therapeutic drug level monitoring: Secondary | ICD-10-CM

## 2017-01-05 DIAGNOSIS — Q211 Atrial septal defect: Secondary | ICD-10-CM

## 2017-01-05 NOTE — Progress Notes (Signed)
Spoke to patient and advised to continue same dose of coumadin  1 tablet daily except 1/2 tablet on Tuesdays and Saturdays. [facility] # [phone].  Recheck INR in 2 weeks. Self-tester.  Pt will go to Lab at [facility] if does not get test strips before the 20th

## 2017-01-16 ENCOUNTER — Ambulatory Visit (INDEPENDENT_AMBULATORY_CARE_PROVIDER_SITE_OTHER): Payer: Medicare Other | Admitting: Cardiology

## 2017-01-16 DIAGNOSIS — Q2112 Patent foramen ovale: Secondary | ICD-10-CM

## 2017-01-16 DIAGNOSIS — I480 Paroxysmal atrial fibrillation: Secondary | ICD-10-CM | POA: Diagnosis not present

## 2017-01-16 DIAGNOSIS — Z7901 Long term (current) use of anticoagulants: Secondary | ICD-10-CM | POA: Diagnosis not present

## 2017-01-16 DIAGNOSIS — I4892 Unspecified atrial flutter: Secondary | ICD-10-CM | POA: Diagnosis not present

## 2017-01-16 DIAGNOSIS — Z5181 Encounter for therapeutic drug level monitoring: Secondary | ICD-10-CM

## 2017-01-16 DIAGNOSIS — I6350 Cerebral infarction due to unspecified occlusion or stenosis of unspecified cerebral artery: Secondary | ICD-10-CM | POA: Diagnosis not present

## 2017-01-16 DIAGNOSIS — Q211 Atrial septal defect: Secondary | ICD-10-CM

## 2017-01-16 LAB — POCT INR: INR: 3.9

## 2017-01-31 ENCOUNTER — Ambulatory Visit (INDEPENDENT_AMBULATORY_CARE_PROVIDER_SITE_OTHER): Payer: Medicare Other | Admitting: Cardiovascular Disease

## 2017-01-31 DIAGNOSIS — Z5181 Encounter for therapeutic drug level monitoring: Secondary | ICD-10-CM | POA: Diagnosis not present

## 2017-01-31 DIAGNOSIS — I4892 Unspecified atrial flutter: Secondary | ICD-10-CM | POA: Diagnosis not present

## 2017-01-31 DIAGNOSIS — I6350 Cerebral infarction due to unspecified occlusion or stenosis of unspecified cerebral artery: Secondary | ICD-10-CM | POA: Diagnosis not present

## 2017-01-31 DIAGNOSIS — Q2112 Patent foramen ovale: Secondary | ICD-10-CM

## 2017-01-31 DIAGNOSIS — I482 Chronic atrial fibrillation: Secondary | ICD-10-CM | POA: Diagnosis not present

## 2017-01-31 DIAGNOSIS — I480 Paroxysmal atrial fibrillation: Secondary | ICD-10-CM | POA: Diagnosis not present

## 2017-01-31 DIAGNOSIS — Q211 Atrial septal defect: Secondary | ICD-10-CM

## 2017-01-31 DIAGNOSIS — Z7901 Long term (current) use of anticoagulants: Secondary | ICD-10-CM | POA: Diagnosis not present

## 2017-01-31 LAB — POCT INR: INR: 3

## 2017-02-14 ENCOUNTER — Ambulatory Visit (INDEPENDENT_AMBULATORY_CARE_PROVIDER_SITE_OTHER): Payer: Medicare Other | Admitting: Internal Medicine

## 2017-02-14 DIAGNOSIS — Q211 Atrial septal defect: Secondary | ICD-10-CM

## 2017-02-14 DIAGNOSIS — I6350 Cerebral infarction due to unspecified occlusion or stenosis of unspecified cerebral artery: Secondary | ICD-10-CM

## 2017-02-14 DIAGNOSIS — I480 Paroxysmal atrial fibrillation: Secondary | ICD-10-CM | POA: Diagnosis not present

## 2017-02-14 DIAGNOSIS — Z1231 Encounter for screening mammogram for malignant neoplasm of breast: Secondary | ICD-10-CM | POA: Diagnosis not present

## 2017-02-14 DIAGNOSIS — I63132 Cerebral infarction due to embolism of left carotid artery: Secondary | ICD-10-CM | POA: Diagnosis not present

## 2017-02-14 DIAGNOSIS — Q2112 Patent foramen ovale: Secondary | ICD-10-CM

## 2017-02-14 DIAGNOSIS — I4892 Unspecified atrial flutter: Secondary | ICD-10-CM | POA: Diagnosis not present

## 2017-02-14 DIAGNOSIS — Z5181 Encounter for therapeutic drug level monitoring: Secondary | ICD-10-CM | POA: Diagnosis not present

## 2017-02-14 LAB — HM MAMMOGRAPHY

## 2017-02-14 LAB — POCT INR: INR: 2.3

## 2017-02-14 NOTE — Patient Instructions (Signed)
Description   Spoke to patient and advised to take coumadin 7.5mg  today Dec 19th then  continue same dose of coumadin 1 tablet daily except 1/2 tablet on Tuesdays and Saturdays. call [facility] # [phone].with any changes new medications or if scheduled for any procedures  Recheck INR in 2 weeks, self-tester.

## 2017-02-28 ENCOUNTER — Ambulatory Visit (INDEPENDENT_AMBULATORY_CARE_PROVIDER_SITE_OTHER): Payer: Medicare Other | Admitting: Internal Medicine

## 2017-02-28 DIAGNOSIS — I480 Paroxysmal atrial fibrillation: Secondary | ICD-10-CM

## 2017-02-28 DIAGNOSIS — Q2112 Patent foramen ovale: Secondary | ICD-10-CM

## 2017-02-28 DIAGNOSIS — I63132 Cerebral infarction due to embolism of left carotid artery: Secondary | ICD-10-CM | POA: Diagnosis not present

## 2017-02-28 DIAGNOSIS — Z5181 Encounter for therapeutic drug level monitoring: Secondary | ICD-10-CM

## 2017-02-28 DIAGNOSIS — I4892 Unspecified atrial flutter: Secondary | ICD-10-CM | POA: Diagnosis not present

## 2017-02-28 DIAGNOSIS — Q211 Atrial septal defect: Secondary | ICD-10-CM

## 2017-02-28 DIAGNOSIS — I6350 Cerebral infarction due to unspecified occlusion or stenosis of unspecified cerebral artery: Secondary | ICD-10-CM

## 2017-02-28 LAB — POCT INR: INR: 3.2

## 2017-02-28 NOTE — Patient Instructions (Signed)
Description   Spoke to patient and advised to continue same dose of coumadin 1 tablet daily except 1/2 tablet on Tuesdays and Saturdays. call [facility] Clinic # [phone].with any changes new medications or if scheduled for any procedures  Recheck INR in 2 weeks, self-tester.

## 2017-03-14 ENCOUNTER — Ambulatory Visit (INDEPENDENT_AMBULATORY_CARE_PROVIDER_SITE_OTHER): Payer: Medicare Other | Admitting: Internal Medicine

## 2017-03-14 DIAGNOSIS — I6350 Cerebral infarction due to unspecified occlusion or stenosis of unspecified cerebral artery: Secondary | ICD-10-CM | POA: Diagnosis not present

## 2017-03-14 DIAGNOSIS — I4892 Unspecified atrial flutter: Secondary | ICD-10-CM | POA: Diagnosis not present

## 2017-03-14 DIAGNOSIS — Z5181 Encounter for therapeutic drug level monitoring: Secondary | ICD-10-CM

## 2017-03-14 DIAGNOSIS — I63132 Cerebral infarction due to embolism of left carotid artery: Secondary | ICD-10-CM | POA: Diagnosis not present

## 2017-03-14 DIAGNOSIS — Q211 Atrial septal defect: Secondary | ICD-10-CM

## 2017-03-14 DIAGNOSIS — Q2112 Patent foramen ovale: Secondary | ICD-10-CM

## 2017-03-14 DIAGNOSIS — I482 Chronic atrial fibrillation: Secondary | ICD-10-CM | POA: Diagnosis not present

## 2017-03-14 DIAGNOSIS — I480 Paroxysmal atrial fibrillation: Secondary | ICD-10-CM | POA: Diagnosis not present

## 2017-03-14 DIAGNOSIS — Z7901 Long term (current) use of anticoagulants: Secondary | ICD-10-CM | POA: Diagnosis not present

## 2017-03-14 LAB — POCT INR: INR: 2.9

## 2017-03-14 NOTE — Patient Instructions (Signed)
Description   Spoke to patient and advised to continue same dose of coumadin 1 tablet daily except 1/2 tablet on Tuesdays and Saturdays. call [facility]in Clinic # [phone].with any changes new medications or if scheduled for any procedures  Recheck INR in 2 weeks, self-tester.

## 2017-03-15 ENCOUNTER — Encounter: Payer: Self-pay | Admitting: Internal Medicine

## 2017-03-15 ENCOUNTER — Ambulatory Visit (INDEPENDENT_AMBULATORY_CARE_PROVIDER_SITE_OTHER): Payer: Medicare Other | Admitting: Internal Medicine

## 2017-03-15 VITALS — BP 122/60 | HR 65 | Temp 97.9°F | Resp 15 | Ht 67.5 in | Wt 153.0 lb

## 2017-03-15 DIAGNOSIS — M810 Age-related osteoporosis without current pathological fracture: Secondary | ICD-10-CM | POA: Diagnosis not present

## 2017-03-15 DIAGNOSIS — M85852 Other specified disorders of bone density and structure, left thigh: Secondary | ICD-10-CM | POA: Diagnosis not present

## 2017-03-15 DIAGNOSIS — E7800 Pure hypercholesterolemia, unspecified: Secondary | ICD-10-CM

## 2017-03-15 DIAGNOSIS — E559 Vitamin D deficiency, unspecified: Secondary | ICD-10-CM | POA: Diagnosis not present

## 2017-03-15 DIAGNOSIS — I10 Essential (primary) hypertension: Secondary | ICD-10-CM | POA: Diagnosis not present

## 2017-03-15 DIAGNOSIS — Z23 Encounter for immunization: Secondary | ICD-10-CM | POA: Diagnosis not present

## 2017-03-15 DIAGNOSIS — Z0000 Encounter for general adult medical examination without abnormal findings: Secondary | ICD-10-CM | POA: Diagnosis not present

## 2017-03-15 DIAGNOSIS — Z8673 Personal history of transient ischemic attack (TIA), and cerebral infarction without residual deficits: Secondary | ICD-10-CM

## 2017-03-15 DIAGNOSIS — Z7901 Long term (current) use of anticoagulants: Secondary | ICD-10-CM | POA: Diagnosis not present

## 2017-03-15 DIAGNOSIS — E785 Hyperlipidemia, unspecified: Secondary | ICD-10-CM

## 2017-03-15 LAB — LIPID PANEL
CHOL/HDL RATIO: 4
Cholesterol: 213 mg/dL — ABNORMAL HIGH (ref 0–200)
HDL: 55.6 mg/dL (ref >39.00)
LDL Cholesterol: 131 mg/dL — ABNORMAL HIGH (ref 0–99)
NONHDL: 157
Triglycerides: 128 mg/dL (ref 0.0–149.0)
VLDL: 25.6 mg/dL (ref 0.0–40.0)

## 2017-03-15 LAB — COMPREHENSIVE METABOLIC PANEL
ALT: 17 U/L (ref 0–35)
AST: 19 U/L (ref 0–37)
Albumin: 4.2 g/dL (ref 3.5–5.2)
Alkaline Phosphatase: 84 U/L (ref 39–117)
BUN: 13 mg/dL (ref 6–23)
CO2: 27 meq/L (ref 19–32)
Calcium: 9.4 mg/dL (ref 8.4–10.5)
Chloride: 103 mEq/L (ref 96–112)
Creatinine, Ser: 0.74 mg/dL (ref 0.40–1.20)
GFR: 81.79 mL/min (ref >60.00)
GLUCOSE: 84 mg/dL (ref 70–99)
POTASSIUM: 4.3 meq/L (ref 3.5–5.1)
Sodium: 137 mEq/L (ref 135–145)
Total Bilirubin: 0.8 mg/dL (ref 0.2–1.2)
Total Protein: 7.2 g/dL (ref 6.0–8.3)

## 2017-03-15 LAB — CBC WITH DIFFERENTIAL/PLATELET
BASOS PCT: 0.6 % (ref 0.0–3.0)
Basophils Absolute: 0 10*3/uL (ref 0.0–0.1)
EOS PCT: 1.1 % (ref 0.0–5.0)
Eosinophils Absolute: 0.1 10*3/uL (ref 0.0–0.7)
HCT: 44.2 % (ref 36.0–46.0)
Hemoglobin: 15.3 g/dL — ABNORMAL HIGH (ref 12.0–15.0)
LYMPHS ABS: 1.5 10*3/uL (ref 0.7–4.0)
Lymphocytes Relative: 21.6 % (ref 12.0–46.0)
MCHC: 34.6 g/dL (ref 30.0–36.0)
MCV: 92.8 fl (ref 78.0–100.0)
MONOS PCT: 8.8 % (ref 3.0–12.0)
Monocytes Absolute: 0.6 10*3/uL (ref 0.1–1.0)
NEUTROS ABS: 4.7 10*3/uL (ref 1.4–7.7)
NEUTROS PCT: 67.9 % (ref 43.0–77.0)
PLATELETS: 246 10*3/uL (ref 150.0–400.0)
RBC: 4.76 Mil/uL (ref 3.87–5.11)
RDW: 12.8 % (ref 11.5–15.5)
WBC: 6.8 10*3/uL (ref 4.0–10.5)

## 2017-03-15 LAB — VITAMIN D 25 HYDROXY (VIT D DEFICIENCY, FRACTURES): VITD: 22.38 ng/mL — ABNORMAL LOW (ref 30.00–100.00)

## 2017-03-15 MED ORDER — ALENDRONATE SODIUM 70 MG PO TABS: 70.0000 mg | ORAL_TABLET | ORAL | 11 refills | Status: DC

## 2017-03-15 MED ORDER — ZOSTER VAC RECOMB ADJUVANTED 50 MCG/0.5ML IM SUSR: 0.5000 mL | Freq: Once | INTRAMUSCULAR | 1 refills | Status: AC

## 2017-03-15 NOTE — Patient Instructions (Addendum)
Your bone density scores are suggesting you have osteoporosis .  This means you are at increased risk of fracture.  Calcium needs:  1800 mg calcium   Vitamin  D :  1000 Ius daily   I am recommending you start taking alendronate 1 tablet  Once per week in the morning on an empty stomach choose one day per week  And delay food and other medications for 40 minutes )   Daily weight bearing exercise (walking and weights for upper body strength)  We will repeat your DEXA in 2 years    Health Maintenance for Postmenopausal Women Menopause is a normal process in which your reproductive ability comes to an end. This process happens gradually over a span of months to years, usually between the ages of 60 and 18. Menopause is complete when you have missed 12 consecutive menstrual periods. It is important to talk with your health care provider about some of the most common conditions that affect postmenopausal women, such as heart disease, cancer, and bone loss (osteoporosis). Adopting a healthy lifestyle and getting preventive care can help to promote your health and wellness. Those actions can also lower your chances of developing some of these common conditions. What should I know about menopause? During menopause, you may experience a number of symptoms, such as:  Moderate-to-severe hot flashes.  Night sweats.  Decrease in sex drive.  Mood swings.  Headaches.  Tiredness.  Irritability.  Memory problems.  Insomnia.  Choosing to treat or not to treat menopausal changes is an individual decision that you make with your health care provider. What should I know about hormone replacement therapy and supplements? Hormone therapy products are effective for treating symptoms that are associated with menopause, such as hot flashes and night sweats. Hormone replacement carries certain risks, especially as you become older. If you are thinking about using estrogen or estrogen with progestin  treatments, discuss the benefits and risks with your health care provider. What should I know about heart disease and stroke? Heart disease, heart attack, and stroke become more likely as you age. This may be due, in part, to the hormonal changes that your body experiences during menopause. These can affect how your body processes dietary fats, triglycerides, and cholesterol. Heart attack and stroke are both medical emergencies. There are many things that you can do to help prevent heart disease and stroke:  Have your blood pressure checked at least every 1-2 years. High blood pressure causes heart disease and increases the risk of stroke.  If you are 23-71 years old, ask your health care provider if you should take aspirin to prevent a heart attack or a stroke.  Do not use any tobacco products, including cigarettes, chewing tobacco, or electronic cigarettes. If you need help quitting, ask your health care provider.  It is important to eat a healthy diet and maintain a healthy weight. ? Be sure to include plenty of vegetables, fruits, low-fat dairy products, and lean protein. ? Avoid eating foods that are high in solid fats, added sugars, or salt (sodium).  Get regular exercise. This is one of the most important things that you can do for your health. ? Try to exercise for at least 150 minutes each week. The type of exercise that you do should increase your heart rate and make you sweat. This is known as moderate-intensity exercise. ? Try to do strengthening exercises at least twice each week. Do these in addition to the moderate-intensity exercise.  Know your numbers.Ask  your health care provider to check your cholesterol and your blood glucose. Continue to have your blood tested as directed by your health care provider.  What should I know about cancer screening? There are several types of cancer. Take the following steps to reduce your risk and to catch any cancer development as early as  possible. Breast Cancer  Practice breast self-awareness. ? This means understanding how your breasts normally appear and feel. ? It also means doing regular breast self-exams. Let your health care provider know about any changes, no matter how small.  If you are 48 or older, have a clinician do a breast exam (clinical breast exam or CBE) every year. Depending on your age, family history, and medical history, it may be recommended that you also have a yearly breast X-ray (mammogram).  If you have a family history of breast cancer, talk with your health care provider about genetic screening.  If you are at high risk for breast cancer, talk with your health care provider about having an MRI and a mammogram every year.  Breast cancer (BRCA) gene test is recommended for women who have family members with BRCA-related cancers. Results of the assessment will determine the need for genetic counseling and BRCA1 and for BRCA2 testing. BRCA-related cancers include these types: ? Breast. This occurs in males or females. ? Ovarian. ? Tubal. This may also be called fallopian tube cancer. ? Cancer of the abdominal or pelvic lining (peritoneal cancer). ? Prostate. ? Pancreatic.  Cervical, Uterine, and Ovarian Cancer Your health care provider may recommend that you be screened regularly for cancer of the pelvic organs. These include your ovaries, uterus, and vagina. This screening involves a pelvic exam, which includes checking for microscopic changes to the surface of your cervix (Pap test).  For women ages 21-65, health care providers may recommend a pelvic exam and a Pap test every three years. For women ages 28-65, they may recommend the Pap test and pelvic exam, combined with testing for human papilloma virus (HPV), every five years. Some types of HPV increase your risk of cervical cancer. Testing for HPV may also be done on women of any age who have unclear Pap test results.  Other health care  providers may not recommend any screening for nonpregnant women who are considered low risk for pelvic cancer and have no symptoms. Ask your health care provider if a screening pelvic exam is right for you.  If you have had past treatment for cervical cancer or a condition that could lead to cancer, you need Pap tests and screening for cancer for at least 20 years after your treatment. If Pap tests have been discontinued for you, your risk factors (such as having a new sexual partner) need to be reassessed to determine if you should start having screenings again. Some women have medical problems that increase the chance of getting cervical cancer. In these cases, your health care provider may recommend that you have screening and Pap tests more often.  If you have a family history of uterine cancer or ovarian cancer, talk with your health care provider about genetic screening.  If you have vaginal bleeding after reaching menopause, tell your health care provider.  There are currently no reliable tests available to screen for ovarian cancer.  Lung Cancer Lung cancer screening is recommended for adults 45-46 years old who are at high risk for lung cancer because of a history of smoking. A yearly low-dose CT scan of the lungs is recommended  if you:  Currently smoke.  Have a history of at least 30 pack-years of smoking and you currently smoke or have quit within the past 15 years. A pack-year is smoking an average of one pack of cigarettes per day for one year.  Yearly screening should:  Continue until it has been 15 years since you quit.  Stop if you develop a health problem that would prevent you from having lung cancer treatment.  Colorectal Cancer  This type of cancer can be detected and can often be prevented.  Routine colorectal cancer screening usually begins at age 48 and continues through age 59.  If you have risk factors for colon cancer, your health care provider may recommend  that you be screened at an earlier age.  If you have a family history of colorectal cancer, talk with your health care provider about genetic screening.  Your health care provider may also recommend using home test kits to check for hidden blood in your stool.  A small camera at the end of a tube can be used to examine your colon directly (sigmoidoscopy or colonoscopy). This is done to check for the earliest forms of colorectal cancer.  Direct examination of the colon should be repeated every 5-10 years until age 29. However, if early forms of precancerous polyps or small growths are found or if you have a family history or genetic risk for colorectal cancer, you may need to be screened more often.  Skin Cancer  Check your skin from head to toe regularly.  Monitor any moles. Be sure to tell your health care provider: ? About any new moles or changes in moles, especially if there is a change in a mole's shape or color. ? If you have a mole that is larger than the size of a pencil eraser.  If any of your family members has a history of skin cancer, especially at a young age, talk with your health care provider about genetic screening.  Always use sunscreen. Apply sunscreen liberally and repeatedly throughout the day.  Whenever you are outside, protect yourself by wearing long sleeves, pants, a wide-brimmed hat, and sunglasses.  What should I know about osteoporosis? Osteoporosis is a condition in which bone destruction happens more quickly than new bone creation. After menopause, you may be at an increased risk for osteoporosis. To help prevent osteoporosis or the bone fractures that can happen because of osteoporosis, the following is recommended:  If you are 17-5 years old, get at least 1,000 mg of calcium and at least 600 mg of vitamin D per day.  If you are older than age 31 but younger than age 52, get at least 1,200 mg of calcium and at least 600 mg of vitamin D per day.  If you  are older than age 58, get at least 1,200 mg of calcium and at least 800 mg of vitamin D per day.  Smoking and excessive alcohol intake increase the risk of osteoporosis. Eat foods that are rich in calcium and vitamin D, and do weight-bearing exercises several times each week as directed by your health care provider. What should I know about how menopause affects my mental health? Depression may occur at any age, but it is more common as you become older. Common symptoms of depression include:  Low or sad mood.  Changes in sleep patterns.  Changes in appetite or eating patterns.  Feeling an overall lack of motivation or enjoyment of activities that you previously enjoyed.  Frequent  crying spells.  Talk with your health care provider if you think that you are experiencing depression. What should I know about immunizations? It is important that you get and maintain your immunizations. These include:  Tetanus, diphtheria, and pertussis (Tdap) booster vaccine.  Influenza every year before the flu season begins.  Pneumonia vaccine.  Shingles vaccine.  Your health care provider may also recommend other immunizations. This information is not intended to replace advice given to you by your health care provider. Make sure you discuss any questions you have with your health care provider. Document Released: 04/07/2005 Document Revised: 09/03/2015 Document Reviewed: 11/17/2014 Elsevier Interactive Patient Education  2018 [employer].

## 2017-03-15 NOTE — Progress Notes (Signed)
Patient ID: Kendra [name], female    DOB: May 08, 1945  Age: 72 y.o. MRN: [id number]  The patient is here for annual follow up and  management of other chronic and acute problems.   She was last seen one year ago.      She is up to date on all screenings.     bp normal weight stable      The risk factors are reflected in the social history.  The roster of all physicians providing medical care to patient - is listed in the Snapshot section of the chart.  Activities of daily living:  The patient is 100% independent in all ADLs: dressing, toileting, feeding as well as independent mobility  Home safety : The patient has smoke detectors in the home. They wear seatbelts.  There are no firearms at home. There is no violence in the home.   There is no risks for hepatitis, STDs or HIV. There is no   history of blood transfusion. They have no travel history to infectious disease endemic areas of the world.  The patient has seen their dentist in the last six month. They have seen their eye doctor in the last year. They admit to slight hearing difficulty with regard to whispered voices and some television programs.  They have deferred audiologic testing in the last year.  They do not  have excessive sun exposure. Discussed the need for sun protection: hats, long sleeves and use of sunscreen if there is significant sun exposure.   Diet: the importance of a healthy diet is discussed. They do have a healthy diet.  The benefits of regular aerobic exercise were discussed. She walks 4 times per week ,  20 minutes.   Depression screen: there are no signs or vegative symptoms of depression- irritability, change in appetite, anhedonia, sadness/tearfullness.  Cognitive assessment: the patient manages all their financial and personal affairs and is actively engaged. They could relate day,date,year and events; recalled 2/3 objects at 3 minutes; performed clock-face test normally.  The following portions of the  patient's history were reviewed and updated as appropriate: allergies, current medications, past family history, past medical history,  past surgical history, past social history  and problem list.  Visual acuity was not assessed per patient preference since she has regular follow up with her ophthalmologist. Hearing and body mass index were assessed and reviewed.   During the course of the visit the patient was educated and counseled about appropriate screening and preventive services including : fall prevention , diabetes screening, nutrition counseling, colorectal cancer screening, and recommended immunizations.    CC: The primary encounter diagnosis was Osteopenia of left hip. Diagnoses of Pure hypercholesterolemia, Chronic anticoagulation, Essential hypertension, Need for 23-polyvalent pneumococcal polysaccharide vaccine, Visit for preventive health examination, Age-related osteoporosis without current pathological fracture, Vitamin D deficiency, and Hyperlipidemia LDL goal <100 were also pertinent to this visit.  Osteopenia:  Her recent DEXA scan indicates that her bone density has decreased in the interval and she now has osteoporosis.    Discussed all available options for treatment, including bisphosphonates,  Raloxifene and Prolia. 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 She has no history of fractures.   Hypertension: patient checks blood pressure twice weekly at home.  Readings have been for the most part < 140/80 at rest . 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   She has a past surgical history that includes ablasion; Cesarean section; Breast biopsy (Right); Appendectomy; fibroid tumor removal (1996); Cardiac electrophysiology study and ablation; Radiology with anesthesia (N/A, 02/16/2015); TEE without cardioversion (N/A, 03/08/2015); and TEE without cardioversion (N/A, 04/28/2015).   Her family history includes Alcohol abuse in her father; Lung cancer in her brother and father; Other in her brother; Stroke in her maternal grandmother.She reports that  has never smoked. she has never used smokeless tobacco. She reports that she does not drink alcohol or use drugs.  Outpatient Medications Prior to Visit  Medication Sig Dispense Refill  . amLODipine (NORVASC) 5 MG tablet TAKE 1 TABLET EVERY DAY 90 tablet 3  . aspirin 81 MG tablet Take 81 mg by mouth daily.    . metoprolol succinate (TOPROL-XL) 100 MG 24 hr tablet TAKE 1 TABLET EVERY DAY WITH OR AFTER A MEAL 90 tablet 3  . Multiple Vitamin (MULTIVITAMIN) tablet Take 1 tablet by mouth daily.      Marland Kitchen warfarin (COUMADIN) 5 MG tablet TAKE AS DIRECTED BY COUMADIN CLINIC 90 tablet 1   No facility-administered medications prior to visit.     Review of Systems   Patient denies headache, fevers, malaise, unintentional weight loss, skin rash, eye pain, sinus congestion and sinus pain, sore throat, dysphagia,  hemoptysis , cough, dyspnea, wheezing, chest pain, palpitations, orthopnea, edema, abdominal pain, nausea, melena, diarrhea, constipation, flank pain, dysuria, hematuria, urinary  Frequency, nocturia, numbness, tingling, seizures,  Focal weakness, Loss of consciousness,  Tremor, insomnia, depression, anxiety, and suicidal ideation.      Objective:  BP 122/60 (BP Location: Left Arm, Patient Position: Sitting, Cuff Size: Normal)   Pulse 65   Temp 97.9 F (36.6 C) (Oral)   Resp 15   Ht 5' 7.5" (1.715 m)   Wt 153 lb (69.4 kg)   SpO2 99%   BMI 23.61  kg/m   Physical Exam  General appearance: alert, cooperative and appears stated age Ears: normal TM's and external ear canals both ears Throat: lips, mucosa, and tongue normal; teeth and gums normal Neck: no adenopathy, no carotid bruit, supple, symmetrical, trachea midline and thyroid not enlarged, symmetric, no tenderness/mass/nodules Back: symmetric, no curvature. ROM normal. No CVA tenderness. Lungs: clear to auscultation bilaterally Heart: regular rate and rhythm, S1, S2 normal, no murmur, click, rub or gallop Abdomen: soft, non-tender; bowel sounds normal; no masses,  no organomegaly Pulses: 2+ and symmetric Skin: Skin color, texture, turgor normal. No rashes or lesions Lymph nodes: Cervical, supraclavicular, and axillary nodes normal.   Assessment & Plan:   Problem List Items Addressed This Visit    Chronic anticoagulation   Relevant Orders   CBC with Differential/Platelet (Completed)   Essential hypertension   Relevant Orders   Comprehensive metabolic panel (Completed)   RESOLVED: Hyperlipidemia LDL goal <100   Osteoporosis    Lengthy discussion of therapy choices.  Alendronate weekly chosen.  Repeat DEXA 2 years. calicium and vitamin d needs discussed       Relevant Medications   alendronate (FOSAMAX) 70 MG tablet   PURE HYPERCHOLESTEROLEMIA    Based on current lipid profile, the risk of clinically significant CAD is 12.5% over the next 10 years, using the Framingham risk calculator. The American  College of Cardiology recommends starting patients aged 32 or higher on moderate intensity statin therapy for LDL between 70-189 and 10 yr risk of CAD > 7.5% ;  and high intensity therapy for anyone with LDL > 190. Will recommend low dose atorvastatin.   Lab Results  Component Value Date   CHOL 213 (H) 03/15/2017   HDL 55.60 03/15/2017   LDLCALC 131 (H) 03/15/2017   LDLDIRECT 117.0 03/08/2016   TRIG 128.0 03/15/2017   CHOLHDL 4 03/15/2017         Relevant Orders    Lipid panel (Completed)   Visit for preventive health examination    Annual comprehensive preventive exam was done as well as an evaluation and management of chronic conditions .  During the course of the visit the patient was educated and counseled about appropriate screening and preventive services including :  diabetes screening, lipid analysis with projected  10 year  risk for CAD  Which is 4.7 % using the Framingham risk calculator for women, , nutrition counseling, colorectal cancer screening, and recommended immunizations.  Printed recommendations for health maintenance screenings was given.        Vitamin D deficiency    With osteoporosis recently diagnosed.  Drisdol prescribed for 3 months,  Then daily 1000 Ius        Other Visit Diagnoses    Osteopenia of left hip    -  Primary   Relevant Orders   VITAMIN D 25 Hydroxy (Vit-D Deficiency, Fractures) (Completed)   Need for 23-polyvalent pneumococcal polysaccharide vaccine       Relevant Orders   Pneumococcal polysaccharide vaccine 23-valent greater than or equal to 2yo subcutaneous/IM (Completed)    A total of 40 minutes was spent with patient more than half of which was spent in counseling patient on the above mentioned issues , reviewing and explaining recent labs and imaging studies done, and coordination of care.  I am having Britteny M. Schamp start on alendronate and Zoster Vaccine Adjuvanted. I am also having her maintain her multivitamin, aspirin, warfarin, metoprolol succinate, and amLODipine.  Meds ordered this encounter  Medications  . alendronate (FOSAMAX) 70 MG tablet    Sig: Take 1 tablet (70 mg total) by mouth every 7 (seven) days. Take with a full glass of water on an empty stomach.    Dispense:  4 tablet    Refill:  11  . Zoster Vaccine Adjuvanted [facility]) injection    Sig: Inject 0.5 mLs into the muscle once for 1 dose.    Dispense:  1 each    Refill:  1    There are no discontinued  medications.  Follow-up: No Follow-up on file.   Crecencio Mc, MD

## 2017-03-17 DIAGNOSIS — M810 Age-related osteoporosis without current pathological fracture: Secondary | ICD-10-CM | POA: Insufficient documentation

## 2017-03-17 DIAGNOSIS — E785 Hyperlipidemia, unspecified: Secondary | ICD-10-CM | POA: Insufficient documentation

## 2017-03-17 DIAGNOSIS — E559 Vitamin D deficiency, unspecified: Secondary | ICD-10-CM | POA: Insufficient documentation

## 2017-03-17 MED ORDER — ERGOCALCIFEROL 1.25 MG (50000 UT) PO CAPS: 50000.0000 [IU] | ORAL_CAPSULE | ORAL | 2 refills | Status: DC

## 2017-03-17 NOTE — Assessment & Plan Note (Signed)
With osteoporosis recently diagnosed.  Drisdol prescribed for 3 months,  Then daily 1000 Ius

## 2017-03-17 NOTE — Assessment & Plan Note (Signed)
Lengthy discussion of therapy choices.  Alendronate weekly chosen.  Repeat DEXA 2 years. calicium and vitamin d needs discussed

## 2017-03-17 NOTE — Assessment & Plan Note (Signed)
Annual comprehensive preventive exam was done as well as an evaluation and management of chronic conditions .  During the course of the visit the patient was educated and counseled about appropriate screening and preventive services including :  diabetes screening, lipid analysis with projected  10 year  risk for CAD  Which is 4.7 % using the Framingham risk calculator for women, , nutrition counseling, colorectal cancer screening, and recommended immunizations.  Printed recommendations for health maintenance screenings was given.  

## 2017-03-17 NOTE — Assessment & Plan Note (Signed)
Based on current lipid profile, the risk of clinically significant CAD is 12.5% over the next 10 years, using the Framingham risk calculator. The [employer] of Cardiology recommends starting patients aged 73 or higher on moderate intensity statin therapy for LDL between 70-189 and 10 yr risk of CAD > 7.5% ;  and high intensity therapy for anyone with LDL > 190. Will recommend low dose atorvastatin.   Lab Results  Component Value Date   CHOL 213 (H) 03/15/2017   HDL 55.60 03/15/2017   LDLCALC 131 (H) 03/15/2017   LDLDIRECT 117.0 03/08/2016   TRIG 128.0 03/15/2017   CHOLHDL 4 03/15/2017

## 2017-03-17 NOTE — Addendum Note (Signed)
Addended by: Crecencio Mc on: 03/17/2017 12:17 PM   Modules accepted: Orders

## 2017-03-27 ENCOUNTER — Telehealth: Payer: Self-pay | Admitting: Internal Medicine

## 2017-03-27 DIAGNOSIS — Z79899 Other long term (current) drug therapy: Secondary | ICD-10-CM

## 2017-03-27 NOTE — Telephone Encounter (Signed)
Copied from [place] [phone]. Topic: Quick Communication - See Telephone Encounter >> Mar 27, 2017 12:46 PM Bea Graff, NT wrote: CRM for notification. See Telephone encounter for: Pt calling and states she would like prefer to take Crestor for her high cholesterol rather than Lipitor. She wanted to advise Dr. Derrel Nip. Please contact pt. [facility]  03/27/17.

## 2017-03-27 NOTE — Telephone Encounter (Signed)
Pt would to switch to Crestor rather than Lipitor. Please advise.

## 2017-03-27 NOTE — Telephone Encounter (Signed)
Can she tell me why she is not willing to take atorvastatin?

## 2017-03-28 ENCOUNTER — Ambulatory Visit (INDEPENDENT_AMBULATORY_CARE_PROVIDER_SITE_OTHER): Payer: Medicare Other | Admitting: Cardiovascular Disease

## 2017-03-28 DIAGNOSIS — Q211 Atrial septal defect: Secondary | ICD-10-CM

## 2017-03-28 DIAGNOSIS — I480 Paroxysmal atrial fibrillation: Secondary | ICD-10-CM | POA: Diagnosis not present

## 2017-03-28 DIAGNOSIS — Q2112 Patent foramen ovale: Secondary | ICD-10-CM

## 2017-03-28 DIAGNOSIS — I4892 Unspecified atrial flutter: Secondary | ICD-10-CM

## 2017-03-28 DIAGNOSIS — Z5181 Encounter for therapeutic drug level monitoring: Secondary | ICD-10-CM

## 2017-03-28 DIAGNOSIS — I6350 Cerebral infarction due to unspecified occlusion or stenosis of unspecified cerebral artery: Secondary | ICD-10-CM

## 2017-03-28 DIAGNOSIS — I63132 Cerebral infarction due to embolism of left carotid artery: Secondary | ICD-10-CM | POA: Diagnosis not present

## 2017-03-28 LAB — POCT INR: INR: 3.6

## 2017-03-28 MED ORDER — ROSUVASTATIN CALCIUM 5 MG PO TABS: 5.0000 mg | ORAL_TABLET | Freq: Every day | ORAL | 3 refills | Status: DC

## 2017-03-28 NOTE — Telephone Encounter (Signed)
Spoke with pt and she stated that she has not started taking the atorvastatin yet because she would like to switch to the Crestor. Pt's reasoning for wanting to switch is because her husband takes it and he told her it is the best one to take.

## 2017-03-28 NOTE — Patient Instructions (Signed)
Description   Spoke to patient and advised to take coumadin 2.5mg  today Jan 30th then continue same dose of coumadin 1 tablet daily except 1/2 tablet on Tuesdays and Saturdays. call [facility] # [phone].with any changes new medications or if scheduled for any procedures  Recheck INR in 2 weeks, self-tester.

## 2017-03-28 NOTE — Telephone Encounter (Signed)
Fine . Let her now that it is a stronger statin,  Not really necessary given her lipid panel,  But since she insists, I will prescribe crestor  using a low dose   Needs to return in 3 weks for liver panel  And then every 6 months.  Cmet.   Ordered

## 2017-03-28 NOTE — Telephone Encounter (Signed)
Spoke with pt and informed her that Dr. Derrel Nip did send in a low dose of the crestor to her pharmacy since her lipid panel results didn't suggest that needed such a strong statin. Pt gave a verbal understanding. Scheduled pt for a non fasting lab appt. Pt is aware of appt date and time.

## 2017-03-29 ENCOUNTER — Other Ambulatory Visit: Payer: Self-pay | Admitting: Cardiovascular Disease

## 2017-03-29 MED ORDER — ROSUVASTATIN CALCIUM 5 MG PO TABS: 5.0000 mg | ORAL_TABLET | Freq: Every day | ORAL | 3 refills | Status: DC

## 2017-03-29 MED ORDER — ERGOCALCIFEROL 1.25 MG (50000 UT) PO CAPS: 50000.0000 [IU] | ORAL_CAPSULE | ORAL | 2 refills | Status: DC

## 2017-03-29 NOTE — Telephone Encounter (Signed)
Both rxs have been sent to correct pharmacy. Pt is aware.

## 2017-03-29 NOTE — Telephone Encounter (Signed)
Pt says that Rx was sent to the incorrect pharmacy. Pt says that she was also expecting a Rx for Vitamin D.   Correct pharmacy is: [id number] [place], [place] [address]   Pt would like to know if Rx could be sent to correct location

## 2017-04-11 ENCOUNTER — Ambulatory Visit (INDEPENDENT_AMBULATORY_CARE_PROVIDER_SITE_OTHER): Payer: Medicare Other | Admitting: Cardiovascular Disease

## 2017-04-11 DIAGNOSIS — I63132 Cerebral infarction due to embolism of left carotid artery: Secondary | ICD-10-CM

## 2017-04-11 DIAGNOSIS — Z5181 Encounter for therapeutic drug level monitoring: Secondary | ICD-10-CM

## 2017-04-11 DIAGNOSIS — I6350 Cerebral infarction due to unspecified occlusion or stenosis of unspecified cerebral artery: Secondary | ICD-10-CM | POA: Diagnosis not present

## 2017-04-11 DIAGNOSIS — Q211 Atrial septal defect: Secondary | ICD-10-CM

## 2017-04-11 DIAGNOSIS — I4892 Unspecified atrial flutter: Secondary | ICD-10-CM | POA: Diagnosis not present

## 2017-04-11 DIAGNOSIS — Q2112 Patent foramen ovale: Secondary | ICD-10-CM

## 2017-04-11 DIAGNOSIS — I480 Paroxysmal atrial fibrillation: Secondary | ICD-10-CM | POA: Diagnosis not present

## 2017-04-11 LAB — POCT INR: INR: 3.2

## 2017-04-11 NOTE — Patient Instructions (Signed)
Description   Spoke to patient and advised to continue same dose of coumadin  1 tablet daily except 1/2 tablet on Tuesdays and Saturdays. call [facility] # [phone].with any changes new medications or if scheduled for any procedures  Recheck INR in 2 weeks, self-tester.

## 2017-04-17 ENCOUNTER — Other Ambulatory Visit (INDEPENDENT_AMBULATORY_CARE_PROVIDER_SITE_OTHER): Payer: Medicare Other

## 2017-04-17 DIAGNOSIS — Z79899 Other long term (current) drug therapy: Secondary | ICD-10-CM

## 2017-04-17 LAB — COMPREHENSIVE METABOLIC PANEL
ALBUMIN: 4.1 g/dL (ref 3.5–5.2)
ALK PHOS: 88 U/L (ref 39–117)
ALT: 19 U/L (ref 0–35)
AST: 16 U/L (ref 0–37)
BUN: 15 mg/dL (ref 6–23)
CO2: 29 mEq/L (ref 19–32)
CREATININE: 0.79 mg/dL (ref 0.40–1.20)
Calcium: 9.3 mg/dL (ref 8.4–10.5)
Chloride: 103 mEq/L (ref 96–112)
GFR: 75.83 mL/min (ref >60.00)
Glucose, Bld: 85 mg/dL (ref 70–99)
Potassium: 4.3 mEq/L (ref 3.5–5.1)
SODIUM: 137 meq/L (ref 135–145)
TOTAL PROTEIN: 7.3 g/dL (ref 6.0–8.3)
Total Bilirubin: 0.8 mg/dL (ref 0.2–1.2)

## 2017-04-25 ENCOUNTER — Ambulatory Visit (INDEPENDENT_AMBULATORY_CARE_PROVIDER_SITE_OTHER): Payer: Medicare Other | Admitting: Internal Medicine

## 2017-04-25 DIAGNOSIS — Z5181 Encounter for therapeutic drug level monitoring: Secondary | ICD-10-CM | POA: Diagnosis not present

## 2017-04-25 DIAGNOSIS — Q2112 Patent foramen ovale: Secondary | ICD-10-CM

## 2017-04-25 DIAGNOSIS — Q211 Atrial septal defect: Secondary | ICD-10-CM

## 2017-04-25 DIAGNOSIS — I4892 Unspecified atrial flutter: Secondary | ICD-10-CM

## 2017-04-25 DIAGNOSIS — I63132 Cerebral infarction due to embolism of left carotid artery: Secondary | ICD-10-CM | POA: Diagnosis not present

## 2017-04-25 DIAGNOSIS — I480 Paroxysmal atrial fibrillation: Secondary | ICD-10-CM

## 2017-04-25 DIAGNOSIS — I6350 Cerebral infarction due to unspecified occlusion or stenosis of unspecified cerebral artery: Secondary | ICD-10-CM

## 2017-04-25 LAB — POCT INR: INR: 3.9

## 2017-04-25 NOTE — Patient Instructions (Signed)
Description   Spoke to patient and advised to hold coumadin today Feb 27th then continue same dose of coumadin  1 tablet daily except 1/2 tablet on Tuesdays and Saturdays. call [facility] # [phone].with any changes new medications or if scheduled for any procedures  Recheck INR in 2 weeks, self-tester.

## 2017-05-09 ENCOUNTER — Ambulatory Visit (INDEPENDENT_AMBULATORY_CARE_PROVIDER_SITE_OTHER): Payer: Medicare Other | Admitting: Cardiovascular Disease

## 2017-05-09 DIAGNOSIS — I6350 Cerebral infarction due to unspecified occlusion or stenosis of unspecified cerebral artery: Secondary | ICD-10-CM | POA: Diagnosis not present

## 2017-05-09 DIAGNOSIS — I480 Paroxysmal atrial fibrillation: Secondary | ICD-10-CM | POA: Diagnosis not present

## 2017-05-09 DIAGNOSIS — I4892 Unspecified atrial flutter: Secondary | ICD-10-CM | POA: Diagnosis not present

## 2017-05-09 DIAGNOSIS — Z5181 Encounter for therapeutic drug level monitoring: Secondary | ICD-10-CM

## 2017-05-09 DIAGNOSIS — Q2112 Patent foramen ovale: Secondary | ICD-10-CM

## 2017-05-09 DIAGNOSIS — Q211 Atrial septal defect: Secondary | ICD-10-CM

## 2017-05-09 DIAGNOSIS — I482 Chronic atrial fibrillation: Secondary | ICD-10-CM | POA: Diagnosis not present

## 2017-05-09 DIAGNOSIS — Z7901 Long term (current) use of anticoagulants: Secondary | ICD-10-CM | POA: Diagnosis not present

## 2017-05-09 LAB — POCT INR: INR: 3

## 2017-05-15 DIAGNOSIS — Z961 Presence of intraocular lens: Secondary | ICD-10-CM | POA: Diagnosis not present

## 2017-05-16 DIAGNOSIS — L570 Actinic keratosis: Secondary | ICD-10-CM | POA: Diagnosis not present

## 2017-05-16 DIAGNOSIS — L814 Other melanin hyperpigmentation: Secondary | ICD-10-CM | POA: Diagnosis not present

## 2017-05-16 DIAGNOSIS — Z23 Encounter for immunization: Secondary | ICD-10-CM | POA: Diagnosis not present

## 2017-05-16 DIAGNOSIS — D1801 Hemangioma of skin and subcutaneous tissue: Secondary | ICD-10-CM | POA: Diagnosis not present

## 2017-05-16 DIAGNOSIS — L821 Other seborrheic keratosis: Secondary | ICD-10-CM | POA: Diagnosis not present

## 2017-05-16 DIAGNOSIS — D225 Melanocytic nevi of trunk: Secondary | ICD-10-CM | POA: Diagnosis not present

## 2017-05-23 ENCOUNTER — Ambulatory Visit (INDEPENDENT_AMBULATORY_CARE_PROVIDER_SITE_OTHER): Payer: Medicare Other | Admitting: Internal Medicine

## 2017-05-23 DIAGNOSIS — I63132 Cerebral infarction due to embolism of left carotid artery: Secondary | ICD-10-CM | POA: Diagnosis not present

## 2017-05-23 DIAGNOSIS — Z5181 Encounter for therapeutic drug level monitoring: Secondary | ICD-10-CM | POA: Diagnosis not present

## 2017-05-23 DIAGNOSIS — I4892 Unspecified atrial flutter: Secondary | ICD-10-CM | POA: Diagnosis not present

## 2017-05-23 DIAGNOSIS — Q2112 Patent foramen ovale: Secondary | ICD-10-CM

## 2017-05-23 DIAGNOSIS — I480 Paroxysmal atrial fibrillation: Secondary | ICD-10-CM | POA: Diagnosis not present

## 2017-05-23 DIAGNOSIS — Q211 Atrial septal defect: Secondary | ICD-10-CM

## 2017-05-23 DIAGNOSIS — I6350 Cerebral infarction due to unspecified occlusion or stenosis of unspecified cerebral artery: Secondary | ICD-10-CM

## 2017-05-23 LAB — POCT INR: INR: 3

## 2017-05-23 NOTE — Patient Instructions (Signed)
Description   Spoke to patient and advised to continue same dose of coumadin  1 tablet daily except 1/2 tablet on Tuesdays and Saturdays. call [facility] # [phone].with any changes new medications or if scheduled for any procedures  Recheck INR in 2 weeks, self-tester.

## 2017-06-06 ENCOUNTER — Ambulatory Visit (INDEPENDENT_AMBULATORY_CARE_PROVIDER_SITE_OTHER): Payer: Medicare Other | Admitting: Internal Medicine

## 2017-06-06 DIAGNOSIS — I480 Paroxysmal atrial fibrillation: Secondary | ICD-10-CM

## 2017-06-06 DIAGNOSIS — Q2112 Patent foramen ovale: Secondary | ICD-10-CM

## 2017-06-06 DIAGNOSIS — Z5181 Encounter for therapeutic drug level monitoring: Secondary | ICD-10-CM | POA: Diagnosis not present

## 2017-06-06 DIAGNOSIS — I63132 Cerebral infarction due to embolism of left carotid artery: Secondary | ICD-10-CM | POA: Diagnosis not present

## 2017-06-06 DIAGNOSIS — Q211 Atrial septal defect: Secondary | ICD-10-CM

## 2017-06-06 DIAGNOSIS — I4892 Unspecified atrial flutter: Secondary | ICD-10-CM

## 2017-06-06 DIAGNOSIS — I6350 Cerebral infarction due to unspecified occlusion or stenosis of unspecified cerebral artery: Secondary | ICD-10-CM

## 2017-06-06 LAB — POCT INR: INR: 2.7

## 2017-06-06 NOTE — Patient Instructions (Signed)
Description   Spoke with  patient and advised to continue same dose of coumadin  1 tablet daily except 1/2 tablet on Tuesdays and Saturdays. call [facility] # [phone].with any changes new medications or if scheduled for any procedures  Recheck INR in 2 weeks, self-tester.

## 2017-06-07 ENCOUNTER — Other Ambulatory Visit: Payer: Self-pay | Admitting: Internal Medicine

## 2017-06-08 NOTE — Telephone Encounter (Signed)
Patient should be taking vitamin D# OTC 1000 mg daily message sent to pharmacy and patient.

## 2017-06-20 ENCOUNTER — Ambulatory Visit (INDEPENDENT_AMBULATORY_CARE_PROVIDER_SITE_OTHER): Payer: Medicare Other | Admitting: Internal Medicine

## 2017-06-20 DIAGNOSIS — Z5181 Encounter for therapeutic drug level monitoring: Secondary | ICD-10-CM

## 2017-06-20 DIAGNOSIS — I480 Paroxysmal atrial fibrillation: Secondary | ICD-10-CM

## 2017-06-20 DIAGNOSIS — I4892 Unspecified atrial flutter: Secondary | ICD-10-CM

## 2017-06-20 DIAGNOSIS — Q2112 Patent foramen ovale: Secondary | ICD-10-CM

## 2017-06-20 DIAGNOSIS — Q211 Atrial septal defect: Secondary | ICD-10-CM

## 2017-06-20 DIAGNOSIS — I6350 Cerebral infarction due to unspecified occlusion or stenosis of unspecified cerebral artery: Secondary | ICD-10-CM

## 2017-06-20 LAB — POCT INR: INR: 4.1

## 2017-06-21 ENCOUNTER — Other Ambulatory Visit: Payer: Self-pay | Admitting: Internal Medicine

## 2017-06-30 ENCOUNTER — Other Ambulatory Visit: Payer: Self-pay | Admitting: Internal Medicine

## 2017-07-04 ENCOUNTER — Ambulatory Visit (INDEPENDENT_AMBULATORY_CARE_PROVIDER_SITE_OTHER): Payer: Medicare Other | Admitting: Cardiovascular Disease

## 2017-07-04 DIAGNOSIS — I480 Paroxysmal atrial fibrillation: Secondary | ICD-10-CM

## 2017-07-04 DIAGNOSIS — Q211 Atrial septal defect: Secondary | ICD-10-CM | POA: Diagnosis not present

## 2017-07-04 DIAGNOSIS — Q2112 Patent foramen ovale: Secondary | ICD-10-CM

## 2017-07-04 DIAGNOSIS — Z7901 Long term (current) use of anticoagulants: Secondary | ICD-10-CM | POA: Diagnosis not present

## 2017-07-04 DIAGNOSIS — Z5181 Encounter for therapeutic drug level monitoring: Secondary | ICD-10-CM

## 2017-07-04 DIAGNOSIS — I6350 Cerebral infarction due to unspecified occlusion or stenosis of unspecified cerebral artery: Secondary | ICD-10-CM

## 2017-07-04 DIAGNOSIS — I482 Chronic atrial fibrillation: Secondary | ICD-10-CM | POA: Diagnosis not present

## 2017-07-04 DIAGNOSIS — I63132 Cerebral infarction due to embolism of left carotid artery: Secondary | ICD-10-CM | POA: Diagnosis not present

## 2017-07-04 DIAGNOSIS — I4892 Unspecified atrial flutter: Secondary | ICD-10-CM | POA: Diagnosis not present

## 2017-07-04 LAB — POCT INR: INR: 2.4

## 2017-07-04 NOTE — Patient Instructions (Signed)
Description   Spoke with  patient and advised her to take another 1/2 tablet of coumadin today May 7th as she has already taken 1 tablet then  continue same dose of coumadin  1 tablet daily except 1/2 tablet on Tuesdays and Saturdays. Call [facility] # [phone].with any changes new medications or if scheduled for any procedures  Recheck INR in 2 weeks, self-tester.

## 2017-07-18 ENCOUNTER — Ambulatory Visit (INDEPENDENT_AMBULATORY_CARE_PROVIDER_SITE_OTHER): Payer: Medicare Other | Admitting: Internal Medicine

## 2017-07-18 DIAGNOSIS — I4892 Unspecified atrial flutter: Secondary | ICD-10-CM | POA: Diagnosis not present

## 2017-07-18 DIAGNOSIS — I6350 Cerebral infarction due to unspecified occlusion or stenosis of unspecified cerebral artery: Secondary | ICD-10-CM

## 2017-07-18 DIAGNOSIS — Z5181 Encounter for therapeutic drug level monitoring: Secondary | ICD-10-CM

## 2017-07-18 DIAGNOSIS — I480 Paroxysmal atrial fibrillation: Secondary | ICD-10-CM

## 2017-07-18 DIAGNOSIS — Q2112 Patent foramen ovale: Secondary | ICD-10-CM

## 2017-07-18 DIAGNOSIS — Q211 Atrial septal defect: Secondary | ICD-10-CM

## 2017-07-18 LAB — POCT INR: INR: 3.2 — AB (ref 2.0–3.0)

## 2017-08-01 ENCOUNTER — Ambulatory Visit (INDEPENDENT_AMBULATORY_CARE_PROVIDER_SITE_OTHER): Payer: Medicare Other | Admitting: Interventional Cardiology

## 2017-08-01 DIAGNOSIS — Q2112 Patent foramen ovale: Secondary | ICD-10-CM

## 2017-08-01 DIAGNOSIS — I480 Paroxysmal atrial fibrillation: Secondary | ICD-10-CM | POA: Diagnosis not present

## 2017-08-01 DIAGNOSIS — Q211 Atrial septal defect: Secondary | ICD-10-CM

## 2017-08-01 DIAGNOSIS — Z5181 Encounter for therapeutic drug level monitoring: Secondary | ICD-10-CM

## 2017-08-01 DIAGNOSIS — I4892 Unspecified atrial flutter: Secondary | ICD-10-CM

## 2017-08-01 DIAGNOSIS — I6350 Cerebral infarction due to unspecified occlusion or stenosis of unspecified cerebral artery: Secondary | ICD-10-CM

## 2017-08-01 LAB — POCT INR: INR: 2.9 (ref 2.0–3.0)

## 2017-08-02 ENCOUNTER — Encounter: Payer: Self-pay | Admitting: Internal Medicine

## 2017-08-02 ENCOUNTER — Ambulatory Visit (INDEPENDENT_AMBULATORY_CARE_PROVIDER_SITE_OTHER): Payer: Medicare Other | Admitting: Internal Medicine

## 2017-08-02 VITALS — BP 134/72 | HR 62 | Ht 67.5 in | Wt 155.0 lb

## 2017-08-02 DIAGNOSIS — I6350 Cerebral infarction due to unspecified occlusion or stenosis of unspecified cerebral artery: Secondary | ICD-10-CM

## 2017-08-02 DIAGNOSIS — I4891 Unspecified atrial fibrillation: Secondary | ICD-10-CM | POA: Diagnosis not present

## 2017-08-02 DIAGNOSIS — I119 Hypertensive heart disease without heart failure: Secondary | ICD-10-CM

## 2017-08-02 DIAGNOSIS — I480 Paroxysmal atrial fibrillation: Secondary | ICD-10-CM | POA: Diagnosis not present

## 2017-08-02 NOTE — Patient Instructions (Signed)

## 2017-08-02 NOTE — Progress Notes (Signed)
HPI Kendra [name] returns today for followup. She is a pleasant 72 yo woman with a h/o HTN, remote atrial fib/flutter, and recurrent thromboembolic complications, who was placed back on warfarin therapy. In the interim she has been stable. She has not had syncope. She exercises regularly with no symptoms and no limitation. She had some peripheral edema which resolved when she reduced the dose of her amlodipine.  Allergies  Allergen Reactions  . Amiodarone Hcl Nausea Only and Rash     Current Outpatient Medications  Medication Sig Dispense Refill  . alendronate (FOSAMAX) 70 MG tablet Take 1 tablet (70 mg total) by mouth every 7 (seven) days. Take with a full glass of water on an empty stomach. 4 tablet 11  . amLODipine (NORVASC) 5 MG tablet Take 2.5 mg by mouth daily.    Marland Kitchen aspirin 81 MG tablet Take 81 mg by mouth daily.    . cholecalciferol (VITAMIN D) 1000 units tablet Take 1,000 Units by mouth daily.    . metoprolol succinate (TOPROL-XL) 100 MG 24 hr tablet TAKE 1 TABLET EVERY DAY WITH OR AFTER A MEAL 90 tablet 3  . rosuvastatin (CRESTOR) 5 MG tablet Take 1 tablet (5 mg total) by mouth daily. 90 tablet 3  . warfarin (COUMADIN) 5 MG tablet TAKE AS DIRECTED BY COUMADIN CLINIC 90 tablet 1   No current facility-administered medications for this visit.      Past Medical History:  Diagnosis Date  . Arthus phenomenon   . Atrial fibrillation ([place])   . Atrial flutter ([place])   . Chronic anticoagulation 12/14/2015  . 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 GERD (gastroesophageal reflux disease)   . Hemorrhoids   . Hx of blood clots   . Hypercholesterolemia   . Kidney infarction [facility])   . Stroke ([place])   . TIA (transient ischemic attack)    as per 07/30/00 note from [facility]     ROS:   All systems reviewed and negative except as noted in the HPI.   Past Surgical History:  Procedure Laterality Date  . ablasion    . APPENDECTOMY    .  BREAST BIOPSY Right   . CARDIAC ELECTROPHYSIOLOGY STUDY AND ABLATION    . CESAREAN SECTION    . fibroid tumor removal  1996  . RADIOLOGY WITH ANESTHESIA N/A 02/16/2015   Procedure: RADIOLOGY WITH ANESTHESIA;  Surgeon: Medication Radiologist, MD;  Location: [place] [facility];  Service: Radiology;  Laterality: N/A;  . TEE WITHOUT CARDIOVERSION N/A 03/08/2015   Procedure: TRANSESOPHAGEAL ECHOCARDIOGRAM (TEE);  Surgeon: [name], MD;  Location: [place];  Service: Cardiovascular;  Laterality: N/A;  . TEE WITHOUT CARDIOVERSION N/A 04/28/2015   Procedure: TRANSESOPHAGEAL ECHOCARDIOGRAM (TEE);  Surgeon: [name], MD;  Location: [facility];  Service: Cardiovascular;  Laterality: N/A;     Family History  Problem Relation Age of Onset  . Stroke Maternal Grandmother   . Alcohol abuse Father   . Lung cancer Father   . Other Brother        Emotional Illness  . Lung cancer Brother   . Colon cancer Neg Hx      Social History   Socioeconomic History  . Marital status: Married    Spouse name: Not on file  . Number of children: 4  . Years of education: Not on file  . Highest education level: Not on file  Occupational History  . Occupation: Retired from  [employer]  Social Needs  . Financial resource strain: Not on file  . Food insecurity:    Worry: Not on file    Inability: Not on file  . Transportation needs:    Medical: Not on file    Non-medical: Not on file  Tobacco Use  . Smoking status: Never Smoker  . Smokeless tobacco: Never Used  Substance and Sexual Activity  . Alcohol use: No    Alcohol/week: 0.0 oz  . Drug use: No  . Sexual activity: Not on file  Lifestyle  . Physical activity:    Days per week: Not on file    Minutes per session: Not on file  . Stress: Not on file  Relationships  . Social connections:    Talks on phone: Not on file    Gets together: Not on file    Attends religious service: Not on file    Active member of club or organization:  Not on file    Attends meetings of clubs or organizations: Not on file    Relationship status: Not on file  . Intimate partner violence:    Fear of current or ex partner: Not on file    Emotionally abused: Not on file    Physically abused: Not on file    Forced sexual activity: Not on file  Other Topics Concern  . Not on file  Social History Narrative   ** Merged History Encounter **       Lives with husband   No caffeine drinks      BP 134/72   Pulse 62   Ht 5' 7.5" (1.715 m)   Wt 155 lb (70.3 kg)   BMI 23.92 kg/m   Physical Exam:  Well appearing NAD HEENT: Unremarkable Neck:  No JVD, no thyromegally Lymphatics:  No adenopathy Back:  No CVA tenderness Lungs:  Clear HEART:  Regular rate rhythm, no murmurs, no rubs, no clicks Abd:  soft, positive bowel sounds, no organomegally, no rebound, no guarding Ext:  2 plus pulses, no edema, no cyanosis, no clubbing Skin:  No rashes no nodules Neuro:  CN II through XII intact, motor grossly intact  EKG - nsr  Assess/Plan: 1. Atrial arrhythmias - she has maintained NSR. No change in her meds. 2. HTN - her blood pressure is well controlled. Will continue her current meds. She admits to dietary indiscretion with sodium. I have asked her to reduce her salt intake. 3. Embolic complications - she has had none and is tolerating her coumadin very well. She will continue warfarin.  Kendra [name].D.

## 2017-08-15 ENCOUNTER — Ambulatory Visit (INDEPENDENT_AMBULATORY_CARE_PROVIDER_SITE_OTHER): Payer: Medicare Other | Admitting: Internal Medicine

## 2017-08-15 DIAGNOSIS — I480 Paroxysmal atrial fibrillation: Secondary | ICD-10-CM | POA: Diagnosis not present

## 2017-08-15 DIAGNOSIS — I6350 Cerebral infarction due to unspecified occlusion or stenosis of unspecified cerebral artery: Secondary | ICD-10-CM

## 2017-08-15 DIAGNOSIS — Z5181 Encounter for therapeutic drug level monitoring: Secondary | ICD-10-CM

## 2017-08-15 DIAGNOSIS — I4892 Unspecified atrial flutter: Secondary | ICD-10-CM | POA: Diagnosis not present

## 2017-08-15 DIAGNOSIS — Q2112 Patent foramen ovale: Secondary | ICD-10-CM

## 2017-08-15 DIAGNOSIS — Q211 Atrial septal defect: Secondary | ICD-10-CM | POA: Diagnosis not present

## 2017-08-15 LAB — POCT INR: INR: 3.3 — AB (ref 2.0–3.0)

## 2017-08-15 NOTE — Patient Instructions (Signed)
Description   Spoke with  patient and advised her to continue same dose of coumadin  1 tablet daily except 1/2 tablet on Tuesdays and Saturdays. Call [facility] # [phone] with any changes new medications or if scheduled for any procedures  Recheck INR in 2 weeks, self-tester.

## 2017-08-29 ENCOUNTER — Ambulatory Visit (INDEPENDENT_AMBULATORY_CARE_PROVIDER_SITE_OTHER): Payer: Medicare Other | Admitting: Internal Medicine

## 2017-08-29 DIAGNOSIS — I4892 Unspecified atrial flutter: Secondary | ICD-10-CM | POA: Diagnosis not present

## 2017-08-29 DIAGNOSIS — Z5181 Encounter for therapeutic drug level monitoring: Secondary | ICD-10-CM | POA: Diagnosis not present

## 2017-08-29 DIAGNOSIS — I6350 Cerebral infarction due to unspecified occlusion or stenosis of unspecified cerebral artery: Secondary | ICD-10-CM

## 2017-08-29 DIAGNOSIS — I480 Paroxysmal atrial fibrillation: Secondary | ICD-10-CM | POA: Diagnosis not present

## 2017-08-29 DIAGNOSIS — Q2112 Patent foramen ovale: Secondary | ICD-10-CM

## 2017-08-29 DIAGNOSIS — Q211 Atrial septal defect: Secondary | ICD-10-CM

## 2017-08-29 LAB — POCT INR: INR: 3.7 — AB (ref 2.0–3.0)

## 2017-09-12 ENCOUNTER — Ambulatory Visit (INDEPENDENT_AMBULATORY_CARE_PROVIDER_SITE_OTHER): Payer: Medicare Other | Admitting: Internal Medicine

## 2017-09-12 DIAGNOSIS — Z5181 Encounter for therapeutic drug level monitoring: Secondary | ICD-10-CM | POA: Diagnosis not present

## 2017-09-12 DIAGNOSIS — I480 Paroxysmal atrial fibrillation: Secondary | ICD-10-CM

## 2017-09-12 DIAGNOSIS — I4892 Unspecified atrial flutter: Secondary | ICD-10-CM

## 2017-09-12 DIAGNOSIS — Q211 Atrial septal defect: Secondary | ICD-10-CM | POA: Diagnosis not present

## 2017-09-12 DIAGNOSIS — I6350 Cerebral infarction due to unspecified occlusion or stenosis of unspecified cerebral artery: Secondary | ICD-10-CM

## 2017-09-12 DIAGNOSIS — Q2112 Patent foramen ovale: Secondary | ICD-10-CM

## 2017-09-12 LAB — POCT INR: INR: 3.9 — AB (ref 2.0–3.0)

## 2017-09-26 ENCOUNTER — Ambulatory Visit (INDEPENDENT_AMBULATORY_CARE_PROVIDER_SITE_OTHER): Payer: Medicare Other | Admitting: Pharmacist

## 2017-09-26 DIAGNOSIS — Z5181 Encounter for therapeutic drug level monitoring: Secondary | ICD-10-CM

## 2017-09-26 DIAGNOSIS — Q211 Atrial septal defect: Secondary | ICD-10-CM

## 2017-09-26 DIAGNOSIS — I6350 Cerebral infarction due to unspecified occlusion or stenosis of unspecified cerebral artery: Secondary | ICD-10-CM | POA: Diagnosis not present

## 2017-09-26 DIAGNOSIS — Q2112 Patent foramen ovale: Secondary | ICD-10-CM

## 2017-09-26 DIAGNOSIS — I480 Paroxysmal atrial fibrillation: Secondary | ICD-10-CM

## 2017-09-26 DIAGNOSIS — I4892 Unspecified atrial flutter: Secondary | ICD-10-CM

## 2017-09-26 LAB — POCT INR: INR: 3.4 — AB (ref 2.0–3.0)

## 2017-09-26 NOTE — Patient Instructions (Signed)
Description   Spoke with patient and instructed her to continue taking 1 tablet daily except 1/2 tablet on Tuesdays, Thursdays, and Saturdays. Call [facility] # [phone] with any changes new medications or if scheduled for any procedures  Recheck INR in 2 weeks, self-tester.

## 2017-10-04 DIAGNOSIS — T1490XA Injury, unspecified, initial encounter: Secondary | ICD-10-CM | POA: Diagnosis not present

## 2017-10-04 DIAGNOSIS — S99921A Unspecified injury of right foot, initial encounter: Secondary | ICD-10-CM | POA: Diagnosis not present

## 2017-10-04 DIAGNOSIS — Z6824 Body mass index (BMI) 24.0-24.9, adult: Secondary | ICD-10-CM | POA: Diagnosis not present

## 2017-10-04 DIAGNOSIS — M19071 Primary osteoarthritis, right ankle and foot: Secondary | ICD-10-CM | POA: Diagnosis not present

## 2017-10-04 DIAGNOSIS — M79671 Pain in right foot: Secondary | ICD-10-CM | POA: Diagnosis not present

## 2017-10-09 DIAGNOSIS — M79671 Pain in right foot: Secondary | ICD-10-CM | POA: Diagnosis not present

## 2017-10-09 DIAGNOSIS — S93601D Unspecified sprain of right foot, subsequent encounter: Secondary | ICD-10-CM | POA: Diagnosis not present

## 2017-10-09 DIAGNOSIS — M19079 Primary osteoarthritis, unspecified ankle and foot: Secondary | ICD-10-CM | POA: Diagnosis not present

## 2017-10-09 DIAGNOSIS — Z7901 Long term (current) use of anticoagulants: Secondary | ICD-10-CM | POA: Diagnosis not present

## 2017-10-09 DIAGNOSIS — I10 Essential (primary) hypertension: Secondary | ICD-10-CM | POA: Diagnosis not present

## 2017-10-09 DIAGNOSIS — Z6824 Body mass index (BMI) 24.0-24.9, adult: Secondary | ICD-10-CM | POA: Diagnosis not present

## 2017-10-10 ENCOUNTER — Ambulatory Visit (INDEPENDENT_AMBULATORY_CARE_PROVIDER_SITE_OTHER): Payer: Medicare Other | Admitting: Cardiology

## 2017-10-10 DIAGNOSIS — I4892 Unspecified atrial flutter: Secondary | ICD-10-CM

## 2017-10-10 DIAGNOSIS — Z5181 Encounter for therapeutic drug level monitoring: Secondary | ICD-10-CM

## 2017-10-10 DIAGNOSIS — I480 Paroxysmal atrial fibrillation: Secondary | ICD-10-CM

## 2017-10-10 DIAGNOSIS — I6350 Cerebral infarction due to unspecified occlusion or stenosis of unspecified cerebral artery: Secondary | ICD-10-CM | POA: Diagnosis not present

## 2017-10-10 DIAGNOSIS — Z7901 Long term (current) use of anticoagulants: Secondary | ICD-10-CM | POA: Diagnosis not present

## 2017-10-10 DIAGNOSIS — I482 Chronic atrial fibrillation: Secondary | ICD-10-CM | POA: Diagnosis not present

## 2017-10-10 DIAGNOSIS — Q2112 Patent foramen ovale: Secondary | ICD-10-CM

## 2017-10-10 DIAGNOSIS — Q211 Atrial septal defect: Secondary | ICD-10-CM

## 2017-10-10 LAB — POCT INR: INR: 2.8 (ref 2.0–3.0)

## 2017-10-12 ENCOUNTER — Telehealth: Payer: Self-pay

## 2017-10-12 NOTE — Telephone Encounter (Signed)
Spoke with Pt and husband.  Pt with swollen foot.   Saw urgent care-was advised possible gout.  Then saw PCP- Xray showed old break, possible arthritis, possible gout Advised to start steroid taper.  Pt started steroid taper today- already feels less pain in foot.  Pt stopped amlodipine a few days ago, they have not checked BP.  Advised to continue steroid taper and continue to hold amlodipine.  Asked Pt to keep log of BP's.  Will call Pt in a few weeks and see how her foot is recovering and check in on BP's.  Will continue to monitor.

## 2017-10-12 NOTE — Telephone Encounter (Signed)
-----   Message from Evans Lance, MD sent at 10/08/2017 10:49 PM EDT ----- Ask the patient to stop her amlodipine completely. Monitor blood pressure.

## 2017-10-15 ENCOUNTER — Ambulatory Visit (INDEPENDENT_AMBULATORY_CARE_PROVIDER_SITE_OTHER): Payer: Medicare Other | Admitting: Cardiovascular Disease

## 2017-10-15 DIAGNOSIS — Q211 Atrial septal defect: Secondary | ICD-10-CM

## 2017-10-15 DIAGNOSIS — I480 Paroxysmal atrial fibrillation: Secondary | ICD-10-CM

## 2017-10-15 DIAGNOSIS — Q2112 Patent foramen ovale: Secondary | ICD-10-CM

## 2017-10-15 DIAGNOSIS — Z5181 Encounter for therapeutic drug level monitoring: Secondary | ICD-10-CM | POA: Diagnosis not present

## 2017-10-15 DIAGNOSIS — I6350 Cerebral infarction due to unspecified occlusion or stenosis of unspecified cerebral artery: Secondary | ICD-10-CM | POA: Diagnosis not present

## 2017-10-15 DIAGNOSIS — I4892 Unspecified atrial flutter: Secondary | ICD-10-CM | POA: Diagnosis not present

## 2017-10-15 LAB — POCT INR: INR: 3.5 — AB (ref 2.0–3.0)

## 2017-10-15 NOTE — Patient Instructions (Signed)
Description   Spoke with patient and instructed her to continue taking 1 tablet daily except 1/2 tablet on Tuesdays, Thursdays, and Saturdays. Prednisone will complete tomorrow.  Call [facility] # [phone] with any changes new medications or if scheduled for any procedures  Recheck INR in 2 weeks, self-tester.

## 2017-10-16 DIAGNOSIS — Z6824 Body mass index (BMI) 24.0-24.9, adult: Secondary | ICD-10-CM | POA: Diagnosis not present

## 2017-10-16 DIAGNOSIS — I10 Essential (primary) hypertension: Secondary | ICD-10-CM | POA: Diagnosis not present

## 2017-10-16 DIAGNOSIS — M79671 Pain in right foot: Secondary | ICD-10-CM | POA: Diagnosis not present

## 2017-10-30 ENCOUNTER — Telehealth: Payer: Self-pay | Admitting: Internal Medicine

## 2017-10-30 ENCOUNTER — Ambulatory Visit (INDEPENDENT_AMBULATORY_CARE_PROVIDER_SITE_OTHER): Payer: Medicare Other | Admitting: Cardiology

## 2017-10-30 DIAGNOSIS — Q2112 Patent foramen ovale: Secondary | ICD-10-CM

## 2017-10-30 DIAGNOSIS — I6350 Cerebral infarction due to unspecified occlusion or stenosis of unspecified cerebral artery: Secondary | ICD-10-CM

## 2017-10-30 DIAGNOSIS — Q211 Atrial septal defect: Secondary | ICD-10-CM

## 2017-10-30 DIAGNOSIS — Z5181 Encounter for therapeutic drug level monitoring: Secondary | ICD-10-CM | POA: Diagnosis not present

## 2017-10-30 DIAGNOSIS — I480 Paroxysmal atrial fibrillation: Secondary | ICD-10-CM | POA: Diagnosis not present

## 2017-10-30 DIAGNOSIS — I4892 Unspecified atrial flutter: Secondary | ICD-10-CM | POA: Diagnosis not present

## 2017-10-30 LAB — POCT INR: INR: 3.5 — AB (ref 2.0–3.0)

## 2017-10-30 NOTE — Telephone Encounter (Signed)
New Message:   Pt c/o medication issue:  1. Name of Medication:metoprolol succinate (TOPROL-XL) 100 MG 24 hr tablet  2. How are you currently taking this medication (dosage and times per day)? TAKE 1 TABLET EVERY DAY WITH OR AFTER A MEAL  3. Are you having a reaction (difficulty breathing--STAT)? No   4. What is your medication issue? Pt states her feel are swelling

## 2017-10-30 NOTE — Telephone Encounter (Signed)
Returned call to Pt.  Pt had discontinued amlodipine 2 weeks ago d/t right foot swelling.  Since that time swelling has continued to worsen.  Pt's daughter (physician) wonders if Pt should try furosemide to see if it helps with swelling.  Will discuss with Dr. Lovena Le.  [facility] if Dr. Lovena Le agrees.

## 2017-11-01 NOTE — Telephone Encounter (Signed)
Follow up    Patient is returning call to obtain the recommendation form Dr. Lovena Le about her medication. Please call to discuss.

## 2017-11-02 ENCOUNTER — Telehealth: Payer: Self-pay | Admitting: Internal Medicine

## 2017-11-02 MED ORDER — FUROSEMIDE 20 MG PO TABS: 20.0000 mg | ORAL_TABLET | Freq: Every day | ORAL | 11 refills | Status: DC

## 2017-11-02 NOTE — Telephone Encounter (Signed)
Complete

## 2017-11-02 NOTE — Telephone Encounter (Signed)
New Message          Patient would like returned call, concerning her medications.

## 2017-11-02 NOTE — Telephone Encounter (Signed)
Duplicate

## 2017-11-05 ENCOUNTER — Telehealth: Payer: Self-pay

## 2017-11-05 DIAGNOSIS — E785 Hyperlipidemia, unspecified: Secondary | ICD-10-CM

## 2017-11-05 DIAGNOSIS — I10 Essential (primary) hypertension: Secondary | ICD-10-CM

## 2017-11-05 DIAGNOSIS — E559 Vitamin D deficiency, unspecified: Secondary | ICD-10-CM

## 2017-11-05 DIAGNOSIS — Z7901 Long term (current) use of anticoagulants: Secondary | ICD-10-CM

## 2017-11-05 NOTE — Telephone Encounter (Signed)
Copied from [place] [id number]. Topic: Inquiry >> Nov 05, 2017  4:19 PM Mylinda Latina, NT wrote: Reason for CRM: Patient called and states she needs her Potassium and her Sodium checked. Please call to schedule appt once order is placed. Patient prefer Wednesday AM  CB# [phone]

## 2017-11-06 NOTE — Telephone Encounter (Signed)
Is it ok to check a CMP on pt? Per pt request.

## 2017-11-06 NOTE — Addendum Note (Signed)
Addended by: Crecencio Mc on: 11/06/2017 11:23 AM   Modules accepted: Orders

## 2017-11-06 NOTE — Telephone Encounter (Signed)
She might as well have all the labs needed done.  Lipids vit d cbc and cmet,  Labs ordered.  She is taking a statin, so a 3 hour fast will suffice

## 2017-11-07 ENCOUNTER — Other Ambulatory Visit (INDEPENDENT_AMBULATORY_CARE_PROVIDER_SITE_OTHER): Payer: Medicare Other

## 2017-11-07 DIAGNOSIS — E785 Hyperlipidemia, unspecified: Secondary | ICD-10-CM | POA: Diagnosis not present

## 2017-11-07 DIAGNOSIS — Z7901 Long term (current) use of anticoagulants: Secondary | ICD-10-CM | POA: Diagnosis not present

## 2017-11-07 DIAGNOSIS — I10 Essential (primary) hypertension: Secondary | ICD-10-CM

## 2017-11-07 DIAGNOSIS — E559 Vitamin D deficiency, unspecified: Secondary | ICD-10-CM | POA: Diagnosis not present

## 2017-11-07 LAB — CBC WITH DIFFERENTIAL/PLATELET
BASOS ABS: 0 10*3/uL (ref 0.0–0.1)
BASOS PCT: 0.5 % (ref 0.0–3.0)
EOS ABS: 0.1 10*3/uL (ref 0.0–0.7)
Eosinophils Relative: 1.8 % (ref 0.0–5.0)
HEMATOCRIT: 43.8 % (ref 36.0–46.0)
HEMOGLOBIN: 15.4 g/dL — AB (ref 12.0–15.0)
LYMPHS PCT: 27.9 % (ref 12.0–46.0)
Lymphs Abs: 1.5 10*3/uL (ref 0.7–4.0)
MCHC: 35.1 g/dL (ref 30.0–36.0)
MCV: 89.6 fl (ref 78.0–100.0)
MONO ABS: 0.4 10*3/uL (ref 0.1–1.0)
Monocytes Relative: 7.4 % (ref 3.0–12.0)
Neutro Abs: 3.3 10*3/uL (ref 1.4–7.7)
Neutrophils Relative %: 62.4 % (ref 43.0–77.0)
PLATELETS: 257 10*3/uL (ref 150.0–400.0)
RBC: 4.89 Mil/uL (ref 3.87–5.11)
RDW: 12.7 % (ref 11.5–15.5)
WBC: 5.3 10*3/uL (ref 4.0–10.5)

## 2017-11-07 LAB — LIPID PANEL
CHOL/HDL RATIO: 3
CHOLESTEROL: 160 mg/dL (ref 0–200)
HDL: 56.8 mg/dL (ref >39.00)
LDL CALC: 74 mg/dL (ref 0–99)
NonHDL: 103.01
Triglycerides: 144 mg/dL (ref 0.0–149.0)
VLDL: 28.8 mg/dL (ref 0.0–40.0)

## 2017-11-07 LAB — COMPREHENSIVE METABOLIC PANEL
ALK PHOS: 71 U/L (ref 39–117)
ALT: 21 U/L (ref 0–35)
AST: 19 U/L (ref 0–37)
Albumin: 4.2 g/dL (ref 3.5–5.2)
BUN: 16 mg/dL (ref 6–23)
CO2: 26 mEq/L (ref 19–32)
Calcium: 9.2 mg/dL (ref 8.4–10.5)
Chloride: 104 mEq/L (ref 96–112)
Creatinine, Ser: 0.84 mg/dL (ref 0.40–1.20)
GFR: 70.53 mL/min (ref >60.00)
Glucose, Bld: 92 mg/dL (ref 70–99)
POTASSIUM: 4.3 meq/L (ref 3.5–5.1)
SODIUM: 137 meq/L (ref 135–145)
TOTAL PROTEIN: 7.2 g/dL (ref 6.0–8.3)
Total Bilirubin: 0.9 mg/dL (ref 0.2–1.2)

## 2017-11-07 LAB — VITAMIN D 25 HYDROXY (VIT D DEFICIENCY, FRACTURES): VITD: 36.5 ng/mL (ref 30.00–100.00)

## 2017-11-07 NOTE — Telephone Encounter (Signed)
Spoke with pt ans she stated that she came this morning and had labs drawn.

## 2017-11-09 ENCOUNTER — Encounter: Payer: Self-pay | Admitting: Internal Medicine

## 2017-11-09 ENCOUNTER — Ambulatory Visit (INDEPENDENT_AMBULATORY_CARE_PROVIDER_SITE_OTHER): Payer: Medicare Other | Admitting: Internal Medicine

## 2017-11-09 VITALS — BP 128/70 | HR 70 | Ht 67.5 in | Wt 154.2 lb

## 2017-11-09 DIAGNOSIS — I6350 Cerebral infarction due to unspecified occlusion or stenosis of unspecified cerebral artery: Secondary | ICD-10-CM | POA: Diagnosis not present

## 2017-11-09 DIAGNOSIS — I480 Paroxysmal atrial fibrillation: Secondary | ICD-10-CM | POA: Diagnosis not present

## 2017-11-09 DIAGNOSIS — D582 Other hemoglobinopathies: Secondary | ICD-10-CM | POA: Insufficient documentation

## 2017-11-09 DIAGNOSIS — I10 Essential (primary) hypertension: Secondary | ICD-10-CM | POA: Diagnosis not present

## 2017-11-09 DIAGNOSIS — R600 Localized edema: Secondary | ICD-10-CM | POA: Diagnosis not present

## 2017-11-09 DIAGNOSIS — D751 Secondary polycythemia: Secondary | ICD-10-CM | POA: Insufficient documentation

## 2017-11-09 NOTE — Patient Instructions (Addendum)
Medication Instructions:  Your physician recommends that you continue on your current medications as directed. Please refer to the Current Medication list given to you today.  If your systolic blood pressure is greater than 140 more often than it is below 140 please call to let me know.  Sonia Baller RN [phone]  Labwork: None ordered.  Testing/Procedures: None ordered.  Follow-Up: Your physician wants you to follow-up in: one year with Dr. Lovena Le.   You will receive a reminder letter in the mail two months in advance. If you don't receive a letter, please call our office to schedule the follow-up appointment.   Any Other Special Instructions Will Be Listed Below (If Applicable).  If you need a refill on your cardiac medications before your next appointment, please call your pharmacy.

## 2017-11-09 NOTE — Progress Notes (Signed)
HPI [name] returns today for followup. She is a pleasant 71 yo woman with a h/o PAF, recurrent strokes, hypercoaguable state who has had some problems with peripheral edema. She admits to some dietary indiscretion but denies chest pain or sob. No syncope. Allergies  Allergen Reactions  . Amiodarone Hcl Nausea Only and Rash     Current Outpatient Medications  Medication Sig Dispense Refill  . alendronate (FOSAMAX) 70 MG tablet Take 1 tablet (70 mg total) by mouth every 7 (seven) days. Take with a full glass of water on an empty stomach. 4 tablet 11  . aspirin 81 MG tablet Take 81 mg by mouth daily.    . cholecalciferol (VITAMIN D) 1000 units tablet Take 1,000 Units by mouth daily.    . furosemide (LASIX) 20 MG tablet Take 1 tablet (20 mg total) by mouth daily. 30 tablet 11  . metoprolol succinate (TOPROL-XL) 100 MG 24 hr tablet Take 1 tablet by mouth daily.    . rosuvastatin (CRESTOR) 5 MG tablet Take 1 tablet (5 mg total) by mouth daily. 90 tablet 3  . warfarin (COUMADIN) 5 MG tablet TAKE AS DIRECTED BY COUMADIN CLINIC 90 tablet 1   No current facility-administered medications for this visit.      Past Medical History:  Diagnosis Date  . Arthus phenomenon   . Atrial fibrillation ([place])   . Atrial flutter ([place])   . Chronic anticoagulation 12/14/2015  . 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 GERD (gastroesophageal reflux disease)   . Hemorrhoids   . Hx of blood clots   . Hypercholesterolemia   . Kidney infarction [facility])   . Stroke ([place])   . TIA (transient ischemic attack)    as per 07/30/00 note from [facility]     ROS:   All systems reviewed and negative except as noted in the HPI.   Past Surgical History:  Procedure Laterality Date  . ablasion    . APPENDECTOMY    . BREAST BIOPSY Right   . CARDIAC ELECTROPHYSIOLOGY STUDY AND ABLATION    . CESAREAN SECTION    . fibroid tumor removal  1996  . RADIOLOGY WITH  ANESTHESIA N/A 02/16/2015   Procedure: RADIOLOGY WITH ANESTHESIA;  Surgeon: Medication Radiologist, MD;  Location: [place] [facility];  Service: Radiology;  Laterality: N/A;  . TEE WITHOUT CARDIOVERSION N/A 03/08/2015   Procedure: TRANSESOPHAGEAL ECHOCARDIOGRAM (TEE);  Surgeon: [name], MD;  Location: [place];  Service: Cardiovascular;  Laterality: N/A;  . TEE WITHOUT CARDIOVERSION N/A 04/28/2015   Procedure: TRANSESOPHAGEAL ECHOCARDIOGRAM (TEE);  Surgeon: [name], MD;  Location[facility];  Service: Cardiovascular;  Laterality: N/A;     Family History  Problem Relation Age of Onset  . Stroke Maternal Grandmother   . Alcohol abuse Father   . Lung cancer Father   . Other Brother        Emotional Illness  . Lung cancer Brother   . Colon cancer Neg Hx      Social History   Socioeconomic History  . Marital status: Married    Spouse name: Not on file  . Number of children: 4  . Years of education: Not on file  . Highest education level: Not on file  Occupational History  . 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 Financial resource strain: Not on file  . Food insecurity:    Worry: Not on file  Inability: Not on file  . Transportation needs:    Medical: Not on file    Non-medical: Not on file  Tobacco Use  . Smoking status: Never Smoker  . Smokeless tobacco: Never Used  Substance and Sexual Activity  . Alcohol use: No    Alcohol/week: 0.0 standard drinks  . Drug use: No  . Sexual activity: Not on file  Lifestyle  . Physical activity:    Days per week: Not on file    Minutes per session: Not on file  . Stress: Not on file  Relationships  . Social connections:    Talks on phone: Not on file    Gets together: Not on file    Attends religious service: Not on file    Active member of club or organization: Not on file    Attends meetings of clubs or organizations: Not on file    Relationship status: Not on file  . Intimate partner  violence:    Fear of current or ex partner: Not on file    Emotionally abused: Not on file    Physically abused: Not on file    Forced sexual activity: Not on file  Other Topics Concern  . Not on file  Social History Narrative   ** Merged History Encounter **       Lives with husband   No caffeine drinks      BP 128/70   Pulse 70   Ht 5' 7.5" (1.715 m)   Wt 154 lb 3.2 oz (69.9 kg)   SpO2 99%   BMI 23.79 kg/m   Physical Exam:  Well appearing 72 yo woman, NAD HEENT: Unremarkable Neck:  6 cm JVD, no thyromegally Lymphatics:  No adenopathy Back:  No CVA tenderness Lungs:  Clear with no wheees HEART:  Regular rate rhythm, no murmurs, no rubs, no clicks Abd:  soft, positive bowel sounds, no organomegally, no rebound, no guarding Ext:  2 plus pulses, no edema, no cyanosis, no clubbing Skin:  No rashes no nodules Neuro:  CN II through XII intact, motor grossly intact   Assess/Plan: 1. Peripheral edema - with discontinuation of amlodipine her edema has resolved. 2. PAF - she is maintaining NSR 3. HTN - I have asked her to follow her blood pressure. If her pressures are above 140 more than below, then I would like her to call and we will add additional blood pressure medications.  4. Coags - she has not missed any of her coumadin. Her INR's are therapeutic.   Mikle Bosworth.D.

## 2017-11-13 ENCOUNTER — Ambulatory Visit (INDEPENDENT_AMBULATORY_CARE_PROVIDER_SITE_OTHER): Payer: Medicare Other | Admitting: Cardiology

## 2017-11-13 DIAGNOSIS — D582 Other hemoglobinopathies: Secondary | ICD-10-CM

## 2017-11-13 DIAGNOSIS — Z5181 Encounter for therapeutic drug level monitoring: Secondary | ICD-10-CM | POA: Diagnosis not present

## 2017-11-13 DIAGNOSIS — I6350 Cerebral infarction due to unspecified occlusion or stenosis of unspecified cerebral artery: Secondary | ICD-10-CM

## 2017-11-13 DIAGNOSIS — I4892 Unspecified atrial flutter: Secondary | ICD-10-CM

## 2017-11-13 DIAGNOSIS — I480 Paroxysmal atrial fibrillation: Secondary | ICD-10-CM | POA: Diagnosis not present

## 2017-11-13 DIAGNOSIS — Q2112 Patent foramen ovale: Secondary | ICD-10-CM

## 2017-11-13 DIAGNOSIS — Q211 Atrial septal defect: Secondary | ICD-10-CM

## 2017-11-13 LAB — POCT INR: INR: 3.6 — AB (ref 2.0–3.0)

## 2017-11-15 DIAGNOSIS — Z23 Encounter for immunization: Secondary | ICD-10-CM | POA: Diagnosis not present

## 2017-11-16 ENCOUNTER — Telehealth: Payer: Self-pay | Admitting: Hematology and Oncology

## 2017-11-16 NOTE — Telephone Encounter (Signed)
Pt has been scheduled to see Dr. Audelia Hives on 9/24 at 11am. She originally wanted to see Dr. Beryle Beams, but I explained that he will be retiring in January. Pt voiced understanding and has agreed to the appt date and time.

## 2017-11-20 ENCOUNTER — Other Ambulatory Visit: Payer: Self-pay | Admitting: Hematology and Oncology

## 2017-11-20 ENCOUNTER — Inpatient Hospital Stay: Payer: Medicare Other

## 2017-11-20 ENCOUNTER — Inpatient Hospital Stay: Payer: Medicare Other | Attending: Hematology and Oncology | Admitting: Hematology and Oncology

## 2017-11-20 ENCOUNTER — Encounter: Payer: Self-pay | Admitting: Hematology and Oncology

## 2017-11-20 VITALS — BP 137/66 | HR 81 | Temp 98.2°F | Resp 17 | Wt 155.5 lb

## 2017-11-20 DIAGNOSIS — L539 Erythematous condition, unspecified: Secondary | ICD-10-CM

## 2017-11-20 DIAGNOSIS — I4892 Unspecified atrial flutter: Secondary | ICD-10-CM

## 2017-11-20 DIAGNOSIS — M810 Age-related osteoporosis without current pathological fracture: Secondary | ICD-10-CM

## 2017-11-20 DIAGNOSIS — Z79899 Other long term (current) drug therapy: Secondary | ICD-10-CM

## 2017-11-20 DIAGNOSIS — I6350 Cerebral infarction due to unspecified occlusion or stenosis of unspecified cerebral artery: Secondary | ICD-10-CM

## 2017-11-20 DIAGNOSIS — Z7901 Long term (current) use of anticoagulants: Secondary | ICD-10-CM | POA: Diagnosis not present

## 2017-11-20 DIAGNOSIS — Z7982 Long term (current) use of aspirin: Secondary | ICD-10-CM

## 2017-11-20 DIAGNOSIS — G4700 Insomnia, unspecified: Secondary | ICD-10-CM

## 2017-11-20 DIAGNOSIS — Z801 Family history of malignant neoplasm of trachea, bronchus and lung: Secondary | ICD-10-CM | POA: Diagnosis not present

## 2017-11-20 DIAGNOSIS — Z8673 Personal history of transient ischemic attack (TIA), and cerebral infarction without residual deficits: Secondary | ICD-10-CM | POA: Insufficient documentation

## 2017-11-20 DIAGNOSIS — N280 Ischemia and infarction of kidney: Secondary | ICD-10-CM | POA: Diagnosis not present

## 2017-11-20 DIAGNOSIS — K219 Gastro-esophageal reflux disease without esophagitis: Secondary | ICD-10-CM | POA: Diagnosis not present

## 2017-11-20 DIAGNOSIS — I4891 Unspecified atrial fibrillation: Secondary | ICD-10-CM | POA: Diagnosis not present

## 2017-11-20 DIAGNOSIS — I69320 Aphasia following cerebral infarction: Secondary | ICD-10-CM | POA: Diagnosis not present

## 2017-11-20 DIAGNOSIS — D66 Hereditary factor VIII deficiency: Secondary | ICD-10-CM

## 2017-11-20 DIAGNOSIS — D751 Secondary polycythemia: Secondary | ICD-10-CM

## 2017-11-20 DIAGNOSIS — E785 Hyperlipidemia, unspecified: Secondary | ICD-10-CM | POA: Diagnosis not present

## 2017-11-20 DIAGNOSIS — Z5181 Encounter for therapeutic drug level monitoring: Secondary | ICD-10-CM

## 2017-11-20 DIAGNOSIS — Q211 Atrial septal defect: Secondary | ICD-10-CM

## 2017-11-20 DIAGNOSIS — E559 Vitamin D deficiency, unspecified: Secondary | ICD-10-CM

## 2017-11-20 DIAGNOSIS — D582 Other hemoglobinopathies: Secondary | ICD-10-CM

## 2017-11-20 DIAGNOSIS — Q2112 Patent foramen ovale: Secondary | ICD-10-CM

## 2017-11-20 LAB — CBC WITH DIFFERENTIAL/PLATELET
Basophils Absolute: 0 10*3/uL (ref 0.0–0.1)
Basophils Relative: 0 %
EOS PCT: 2 %
Eosinophils Absolute: 0.1 10*3/uL (ref 0.0–0.5)
HEMATOCRIT: 42.4 % (ref 34.8–46.6)
HEMOGLOBIN: 15.1 g/dL (ref 11.6–15.9)
LYMPHS ABS: 2.1 10*3/uL (ref 0.9–3.3)
LYMPHS PCT: 30 %
MCH: 31.9 pg (ref 25.1–34.0)
MCHC: 35.6 g/dL (ref 31.5–36.0)
MCV: 89.6 fL (ref 79.5–101.0)
Monocytes Absolute: 0.7 10*3/uL (ref 0.1–0.9)
Monocytes Relative: 10 %
NEUTROS ABS: 4 10*3/uL (ref 1.5–6.5)
Neutrophils Relative %: 58 %
PLATELETS: 215 10*3/uL (ref 145–400)
RBC: 4.73 MIL/uL (ref 3.70–5.45)
RDW: 13 % (ref 11.2–14.5)
WBC: 6.9 10*3/uL (ref 3.9–10.3)

## 2017-11-20 LAB — COMPREHENSIVE METABOLIC PANEL
ALBUMIN: 4 g/dL (ref 3.5–5.0)
ALT: 23 U/L (ref 0–44)
ANION GAP: 7 (ref 5–15)
AST: 22 U/L (ref 15–41)
Alkaline Phosphatase: 95 U/L (ref 38–126)
BUN: 13 mg/dL (ref 8–23)
CHLORIDE: 103 mmol/L (ref 98–111)
CO2: 28 mmol/L (ref 22–32)
Calcium: 9.5 mg/dL (ref 8.9–10.3)
Creatinine, Ser: 0.85 mg/dL (ref 0.44–1.00)
GFR calc Af Amer: >60 mL/min (ref >60)
Glucose, Bld: 89 mg/dL (ref 70–99)
POTASSIUM: 4.8 mmol/L (ref 3.5–5.1)
Sodium: 138 mmol/L (ref 135–145)
Total Bilirubin: 0.7 mg/dL (ref 0.3–1.2)
Total Protein: 7.4 g/dL (ref 6.5–8.1)

## 2017-11-20 NOTE — Progress Notes (Signed)
[facility] Initial Consultation  Patient Name:  Kendra [name]  DOB: [birth year]   Date of Service: November 21, 2017  Referring Provider: Crecencio Mc, Md [id number] [place], [place] [address]   Consulting Physician: Henreitta Leber, MD Hematology/Oncology  Patient Care Team: Patient Care Team: Crecencio Mc, MD as PCP - General (Internal Medicine) Evans Lance, MD as PCP - Electrophysiology (Cardiology) Garvin Fila, MD as Consulting Physician (Neurology) Annia Belt, MD as Consulting Physician (Hematology) Ethelle Lyon as Consulting Physician (Cardiology)   Reason for Referral: In the setting of mild erythrocytosis of unclear etiology over a lengthy but indeterminate period, she presents now for further diagnostic and therapeutic recommendations.  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  Her primary care physician is Dr. Deborra Medina.  Her attending cardiologist is Dr. Crissie Sickles. She is accompanied by her attentive husband Kendra [name].  Since at least 1967, she was aware that her hemoglobin was slightly elevated.  In fact, through her primary care physician at the time, by report, she was part of a clinical trial.  Those records are not available on the existing chart.    Her records through the [facility], however,  indicate that on February 16, 2015 a complete blood count was normal.  Those results show hemoglobin 14.1 hematocrit 39.0 MCV 86.7 MCH 31.3 WBC 8.0; platelets 194,000.  She has no known history of any blood disorders in the immediate family.  Her mother however, died during childbirth at the age of 15 years of unclear etiology.  She has no personal history of cancer.  On March 08, 2016 her hemoglobin was slightly elevated at 15.1 g/dL.  The hematocrit was normal at 43.7.  RBC normal at 4.8; WBC 7.0 with 68% neutrophils 22% lymphocytes 8% monocytes 2% eosinophils 1% basophils; platelets 257,000.  On March 15, 2017 all parameters were normal within her blood count with the exception of hemoglobin at 15.3 g/dL.  On November 07, 2017 her most recent complete blood count showed hemoglobin 15.4 hematocrit 43.8 MCV 89.6 MCH 89.6 RDW 12.7 WBC 5.3 with 62% neutrophils 28% lymphocytes 8% monocytes 2% eosinophils 1% basophil; platelets 257,000.  She has episodic difficulty in sleeping.  She has no diabetes mellitus, essential hypertension, or coronary artery disease.  She reports no seizure disorder.  She has no thyroid disease.  There is no peptic disease.  She has no viral hepatitis, inflammatory bowel disease, or diverticulosis.  She reports no kidney disorder or renal dysfunction.  With the exception of mild lower back pain unchanged over her usual baseline, she reports no pain syndrome.  She has had "clots" in both kidneys (infarction) while on anticoagulation .  She has had no significant renal compromise in terms of function.  Over the past 3 weeks she was placed on furosemide due to swelling of the right lower extremity. 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   She has no significant rash or itching.  She denies any appetite or weight deficit.  Her energy level is fairly robust given her age.  She has no acute  visual changes or hearing deficit.  There is no unusual cough, sore throat, orthopnea.  She denies difficulty in breathing either at rest or on exertion.  She denies pain or difficulty in swallowing.  There is no fever, shaking chills, sweats, or flulike symptoms.  She has no significant heartburn or indigestion.  There is no nausea, vomiting, diarrhea, or constipation.  She reports no melena or bright red blood per rectum.  There is no urinary frequency, urgency, hematuria, or dysuria.  Her ankles are no longer swollen.  She denies any bleeding tendency.  She has no numbness or tingling in the fingers or toes.  On occasion she has cramping of her feet.  It is with this background she presents now for further diagnostic and therapeutic recommendations in the setting of mild erythrocytosis of unclear etiology with a normal white blood cell, white blood cell distribution, and normal platelet count.  Past Medical History:  Diagnosis Date  . Arthus phenomenon   . Atrial fibrillation ([place])   . Atrial flutter ([place])   . Chronic anticoagulation 12/14/2015  . 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 GERD (gastroesophageal reflux disease)   . Hemorrhoids   . Hx of blood clots   . Hypercholesterolemia   . Kidney infarction [facility])   . Stroke ([place])   . TIA (transient ischemic attack)    as per 07/30/00 note from [facility]   Body surface area is 1.83 meters squared.   Past Surgical History:  Procedure Laterality Date  . ablasion    . APPENDECTOMY    . BREAST BIOPSY Right   . CARDIAC ELECTROPHYSIOLOGY STUDY AND ABLATION    . CESAREAN SECTION    . fibroid tumor removal  1996  . RADIOLOGY WITH ANESTHESIA N/A 02/16/2015   Procedure: RADIOLOGY WITH ANESTHESIA;  Surgeon: Medication Radiologist, MD;  Location: [facility]UR[facility];  Service: Radiology;  Laterality: N/A;  . TEE WITHOUT CARDIOVERSION N/A 03/08/2015   Procedure: TRANSESOPHAGEAL ECHOCARDIOGRAM (TEE);  Surgeon: [name], MD;  Location: [facility];  Service: Cardiovascular;  Laterality: N/A;  . TEE WITHOUT CARDIOVERSION N/A 04/28/2015   Procedure: TRANSESOPHAGEAL ECHOCARDIOGRAM (TEE);  Surgeon: [name], MD;  [facility];  Service: Cardiovascular;  Laterality: N/A;    Gynecologic History: Her menarche was at age 84 years. Her last menstrual period was in her early 52s. For 6 months she was on estrogen replacement therapy following menopause. Her last screening mammogram was in December, 2018. She had a right breast excisional biopsy in 1989 for benign disease. She is a gravida 5 para 4 miscarriage 1. Her first pregnancy was at age 42 years. Her last pregnancy was at age 33 years.  Family History  Problem Relation Age of Onset  . Stroke Maternal Grandmother   . Alcohol abuse Father   . Lung cancer Father   . Other Brother        Emotional Illness  . Lung cancer Brother   . Colon cancer Neg Hx    Social History   Tobacco Use  . Smoking status: Never Smoker  . Smokeless tobacco: Never Used  Substance Use Topics  . Alcohol use: No    Alcohol/week: 0.0 standard drinks  . Drug use: No  She is married for the past 55 years. She is accompanied by her husband  Kendra [name]. No recreational drug use is reported.  Transfusion History: No prior transfusion  Exposure History: She has no known exposure to toxic chemicals, radiation, or pesticides.  Allergies  Allergen Reactions  . Amiodarone Hcl Nausea Only and Rash  She has no food allergies She has mild nonspecific seasonal allergies  Current Medications:  . alendronate (FOSAMAX) 70 MG tablet Take 1 tablet (70 mg total) by mouth every 7 (seven) days. Take with a full glass of water on an empty stomach. 4 tablet 11  . aspirin 81 MG tablet Take 81 mg by mouth daily.    . cholecalciferol (VITAMIN D) 1000 units tablet Take 1,000 Units by mouth daily.    . furosemide (LASIX) 20 MG tablet Take 1 tablet (20 mg total) by mouth  daily. 30 tablet 11  . metoprolol succinate (TOPROL-XL) 100 MG 24 hr tablet Take 1 tablet by mouth daily.    . rosuvastatin (CRESTOR) 5 MG tablet Take 1 tablet (5 mg total) by mouth daily. 90 tablet 3  . warfarin (COUMADIN) 5 MG tablet TAKE AS DIRECTED BY COUMADIN CLINIC 90    Review of Systems: Constitutional: No fever, sweats, or shaking chills.  No appetite or weight deficit. Skin: No rash, scaling, sores, lumps, or jaundice. HEENT: No visual changes or hearing deficit; no sinus infection. Pulmonary: No unusual cough, sore throat, or orthopnea; no COPD or DOE. Breasts: No complaints. Cardiovascular: No coronary artery disease, angina, or myocardial infarction.  No essential hypertension; dyslipidemia. Gastrointestinal: No indigestion, dysphagia, abdominal pain, diarrhea, or constipation.  No change in bowel habits. Genitourinary: No frequency, urgency, hematuria, or dysuria; bilateral kidney infarcts. Musculoskeletal: No arthralgias or myalgias; no joint swelling, pain, or instability; osteoporosis. Hematologic: Elevated factor VIII level associated with hypercoagulability; no bleeding tendency or easy bruisability. Endocrine: No intolerance to heat or cold; no thyroid disease or diabetes mellitus. Vascular: Peripheral arterial disease; prior stroke syndrome and CVA. Psychological: No anxiety, depression, or mood changes; no mental health illnesses. Neurological: No dizziness, lightheadedness, syncope, or near syncopal episodes; mild disequilibrium following stroke; no numbness or tingling in the fingers or toes.  Physical Examination: Vital Signs: Body surface area is 1.83 meters squared.  BP 137/66   HR 70   RR 17   T 98.2   O2 Sat 99% Constitutional:  Kendra [name] is fully nourished and developed.  She looks age appropriate.  She is friendly and cooperative without respiratory compromise at rest. Skin: No rashes, scaling, dryness, jaundice, or itching. HEENT: Head is normocephalic  and atraumatic.  Pupils are equal round and reactive to light and accommodation.  Sclerae are anicteric.  Conjunctivae are pink.  No sinus tenderness nor oropharyngeal lesions.  Lips without cracking or peeling; tongue without mass, inflammation, or nodularity.  Mucous membranes are moist. Neck: Supple and symmetric.  No jugular venous distention or thyromegaly.  Trachea is midline. Lymphatics: No cervical or supraclavicular lymphadenopathy.  No epitrochlear, axillary, or inguinal lymphadenopathy is appreciated. Breasts: No mass, discharge, dimpling, or retraction. Respiratory/chest: Thorax is symmetrical.  Breath sounds are clear to auscultation and percussion.  Normal excursion and respiratory effort. Back: Symmetric without deformity or tenderness. Cardiovascular: Heart rate and rhythm are regular without murmurs, gallops, or rubs. Gastrointestinal: Abdomen is soft, nontender; no organomegaly.  Bowel sounds are normoactive.  No masses are appreciated. Rectal examination: Not performed. Extremities: In the lower extremities, there is no asymmetric swelling, erythema, tenderness, or cord formation.  No clubbing, cyanosis, nor edema. Hematologic: No petechiae, hematomas, or ecchymoses. Psychological She  is oriented to person, place, and time; normal affect, memory, and cognition. Neurological: There are no gross neurologic deficits.  Laboratory Results:  Ref Range & Units 12:55  WBC 3.9 - 10.3 K/uL 6.9   RBC 3.70 - 5.45 MIL/uL 4.73   Hemoglobin 11.6 - 15.9 g/dL 15.1   HCT 34.8 - 46.6 % 42.4   MCV 79.5 - 101.0 fL 89.6   MCH 25.1 - 34.0 pg 31.9   MCHC 31.5 - 36.0 g/dL 35.6   RDW 11.2 - 14.5 % 13.0   Platelets 145 - 400 K/uL 215   Neutrophils Relative % % 58   Neutro Abs 1.5 - 6.5 K/uL 4.0   Lymphocytes Relative % 30   Lymphs Abs 0.9 - 3.3 K/uL 2.1   Monocytes Relative % 10   Monocytes Absolute 0.1 - 0.9 K/uL 0.7   Eosinophils Relative % 2   Eosinophils Absolute 0.0 - 0.5 K/uL 0.1    Basophils Relative % 0   Basophils Absolute 0.0 - 0.1 K/uL 0.0     Ref Range & Units 12:52  Sodium 135 - 145 mmol/L 138   Potassium 3.5 - 5.1 mmol/L 4.8   Chloride 98 - 111 mmol/L 103   CO2 22 - 32 mmol/L 28   Glucose, Bld 70 - 99 mg/dL 89   BUN 8 - 23 mg/dL 13   Creatinine, Ser 0.44 - 1.00 mg/dL 0.85   Calcium 8.9 - 10.3 mg/dL 9.5   Total Protein 6.5 - 8.1 g/dL 7.4   Albumin 3.5 - 5.0 g/dL 4.0   AST 15 - 41 U/L 22   ALT 0 - 44 U/L 23   Alkaline Phosphatase 38 - 126 U/L 95   Total Bilirubin 0.3 - 1.2 mg/dL 0.7   GFR calc non Af Amer >60 mL/min >60   GFR calc Af Amer >60 mL/min >60    Diagnostic/Imaging Studies: None  Summary/Assessment: 1. In the setting of mild erythrocytosis of unclear etiology over a lengthy but indeterminate period, she presents now for further diagnostic and therapeutic recommendations.  2.  According to [facility], since at least 1967, she was aware that her hemoglobin was slightly elevated.  In fact, through her primary care physician at the time, by report, she was part of a clinical trial.  Those records are not available on the existing chart.  Her records through the [facility], however, indicate that on February 16, 2015 a complete blood count was normal.  Those results show hemoglobin 14.1 hematocrit 39.0 MCV 86.7 MCH 31.3 WBC 8.0; platelets 194,000.  3. On March 08, 2016 her hemoglobin was slightly elevated at 15.1 g/dL.  The hematocrit was normal at 43.7.  RBC normal at 4.8; WBC 7.0 with 68% neutrophils 22% lymphocytes 8% monocytes 2% eosinophils 1% basophils; platelets 257,000.  On March 15, 2017 all parameters were normal within her blood count with the exception of hemoglobin at 15.3 g/dL.  On November 07, 2017 her most recent complete blood count showed hemoglobin 15.4 hematocrit 43.8 MCV 89.6 MCH 89.6 RDW 12.7 WBC 5.3 with 62% neutrophils 28% lymphocytes 8% monocytes 2% eosinophils 1% basophil; platelets 257,000.  4. She has no significant  rash or itching.  She denies any appetite or weight deficit. Her energy level is fairly robust given her age.  She has no acute visual changes or hearing deficit.  There is no unusual cough, sore throat, orthopnea.  She denies difficulty in breathing either at rest or on exertion.  She denies pain  or difficulty in swallowing.  There is no fever, shaking chills, sweats, or flulike symptoms.  She has no significant heartburn or indigestion.  There is no nausea, vomiting, diarrhea, or constipation.  She reports no melena or bright red blood per rectum.  There is no urinary frequency, urgency, hematuria, or dysuria.  Her ankles are no longer swollen.  She denies any bleeding tendency.  She has no numbness or tingling in the fingers or toes.  On occasion she has cramping of her feet.   5.  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  Recommendation/Plan: 1.  A lengthy discussion with both Kendra [name] and Kendra [name] regarding the prior laboratory studies obtained through Dr. Derrel Nip and previously through the [facility].  Those findings are detailed above.  2.  The index of suspicion for an underlying myeloproliferative disorder such as polycythemia vera is extremely low.  In fact today's hemoglobin and hematocrit are normal.  The remainder of her complete blood count is also normal.  Those results are detailed above.  3.  The results of her laboratory studies from today were not available at the time of discharge.  Some of those  results are detailed above.  Serum erythropoietin level is pending.  4.  If her erythropoietin level is normal or even slightly elevated, the chances of polycythemia vera are extremely low.  Her blood count from today ironically suggests that her problem was transient and has apparently resolved.  One can hypothesize that with the use of furosemide her cellular to plasma intravascular ratio has increased since all diuretics to some degree deplete the intravascular compartment.  5.  Although she is leaving for a trip to [place] tomorrow, those results will be available through the patient portal.  She was advised to call us for those results when she returns in 2 weeks unless notified earlier.  Barring any unforeseen complications, for completeness, a repeat complete blood count with office visit has been scheduled for December 11.  The total time spent discussing her previous laboratory studies, methodology for evaluating a reactive versus a primary erythrocytosis/polycythemia was 50 minutes.  At least 50% of that time was spent in discussion, reviewing outside records, laboratory evaluation, counseling, and answering questions.  This note was dictated using voice activated technology/software.  Unfortunately, typographical errors are not uncommon, and transcription is subject to mistakes and regrettably misinterpretation.  If necessary, clarification of the above information can be discussed with me at any time.  Thank you Dr. Derrel Nip for allowing my participation in the care of Janica Tranchina. I will keep you closely informed as the results of her laboratory data become available.  Please do not hesitate to call should any questions arise regarding this initial consultation and discussion.  FOLLOW UP: AS DIRECTED   Henreitta Leber, MD  Hematology/Oncology   cc:   Deborra Medina MD          Crissie Sickles, MD

## 2017-11-20 NOTE — Patient Instructions (Signed)
November 20, 2017 We discussed in detail the previous laboratory studies including your long-standing history of mildly elevated hemoglobin.  Both the white blood cell and platelet count are normal.  Although you are on furosemide the past 3 weeks, this would have no influence on the elevated hemoglobin given the lengthy history dating back many years.  Laboratory studies were obtained to include a complete blood count, comprehensive metabolic panel, and erythropoietin level.  Those results are not available at the time of discharge.  They can be obtained by telephone or on the patient portal when complete.  If there are any questions please do not hesitate to call.  Barring any unforeseen circumstances, your next scheduled visit with laboratory studies is on December 11.  Please do not hesitate to call in the interim should any new or untoward problems arise.

## 2017-11-21 ENCOUNTER — Telehealth: Payer: Self-pay | Admitting: Hematology and Oncology

## 2017-11-21 ENCOUNTER — Ambulatory Visit (INDEPENDENT_AMBULATORY_CARE_PROVIDER_SITE_OTHER): Payer: Medicare Other | Admitting: Cardiovascular Disease

## 2017-11-21 DIAGNOSIS — Z5181 Encounter for therapeutic drug level monitoring: Secondary | ICD-10-CM

## 2017-11-21 DIAGNOSIS — I4892 Unspecified atrial flutter: Secondary | ICD-10-CM

## 2017-11-21 DIAGNOSIS — Q211 Atrial septal defect: Secondary | ICD-10-CM

## 2017-11-21 DIAGNOSIS — I4891 Unspecified atrial fibrillation: Secondary | ICD-10-CM

## 2017-11-21 DIAGNOSIS — I482 Chronic atrial fibrillation: Secondary | ICD-10-CM | POA: Diagnosis not present

## 2017-11-21 DIAGNOSIS — I6350 Cerebral infarction due to unspecified occlusion or stenosis of unspecified cerebral artery: Secondary | ICD-10-CM

## 2017-11-21 DIAGNOSIS — Q2112 Patent foramen ovale: Secondary | ICD-10-CM

## 2017-11-21 DIAGNOSIS — Z7901 Long term (current) use of anticoagulants: Secondary | ICD-10-CM | POA: Diagnosis not present

## 2017-11-21 LAB — POCT INR: INR: 2.1 (ref 2.0–3.0)

## 2017-11-21 LAB — ERYTHROPOIETIN: ERYTHROPOIETIN: 8.7 m[IU]/mL (ref 2.6–18.5)

## 2017-11-21 NOTE — Telephone Encounter (Signed)
Per 9/24 los, called patient and made appt for Dec 11th f/u apointment.

## 2017-12-03 ENCOUNTER — Ambulatory Visit (INDEPENDENT_AMBULATORY_CARE_PROVIDER_SITE_OTHER): Payer: Medicare Other

## 2017-12-03 DIAGNOSIS — I4892 Unspecified atrial flutter: Secondary | ICD-10-CM | POA: Diagnosis not present

## 2017-12-03 DIAGNOSIS — Q211 Atrial septal defect: Secondary | ICD-10-CM | POA: Diagnosis not present

## 2017-12-03 DIAGNOSIS — I6350 Cerebral infarction due to unspecified occlusion or stenosis of unspecified cerebral artery: Secondary | ICD-10-CM | POA: Diagnosis not present

## 2017-12-03 DIAGNOSIS — I4891 Unspecified atrial fibrillation: Secondary | ICD-10-CM | POA: Diagnosis not present

## 2017-12-03 DIAGNOSIS — Z5181 Encounter for therapeutic drug level monitoring: Secondary | ICD-10-CM

## 2017-12-03 DIAGNOSIS — Q2112 Patent foramen ovale: Secondary | ICD-10-CM

## 2017-12-03 LAB — POCT INR: INR: 3 (ref 2.0–3.0)

## 2017-12-03 NOTE — Patient Instructions (Signed)
Description   Spoke with patient and instructed her to continue on same dosage 5mg  daily except 2.5mg  on Tuesdays, Thursdays, and Saturdays.  Call [facility] # [phone] with any changes new medications or if scheduled for any procedures.  Recheck INR in 2 weeks.

## 2017-12-13 ENCOUNTER — Other Ambulatory Visit: Payer: Self-pay | Admitting: Internal Medicine

## 2017-12-14 ENCOUNTER — Other Ambulatory Visit: Payer: Self-pay | Admitting: Internal Medicine

## 2017-12-14 ENCOUNTER — Telehealth: Payer: Self-pay

## 2017-12-14 NOTE — Telephone Encounter (Signed)
Pt is requesting refill for AMLODIPINE 5 MG QD. Does the pt still need this med? Its not listed under their current medications and the visit on 08/02/17 says to take 2.5 mg qd. Thank you.

## 2017-12-17 ENCOUNTER — Other Ambulatory Visit: Payer: Self-pay | Admitting: Cardiovascular Disease

## 2017-12-17 ENCOUNTER — Ambulatory Visit (INDEPENDENT_AMBULATORY_CARE_PROVIDER_SITE_OTHER): Payer: Medicare Other | Admitting: Pharmacist

## 2017-12-17 DIAGNOSIS — I4892 Unspecified atrial flutter: Secondary | ICD-10-CM | POA: Diagnosis not present

## 2017-12-17 DIAGNOSIS — Z5181 Encounter for therapeutic drug level monitoring: Secondary | ICD-10-CM | POA: Diagnosis not present

## 2017-12-17 DIAGNOSIS — Q2112 Patent foramen ovale: Secondary | ICD-10-CM

## 2017-12-17 DIAGNOSIS — I4891 Unspecified atrial fibrillation: Secondary | ICD-10-CM | POA: Diagnosis not present

## 2017-12-17 DIAGNOSIS — I6350 Cerebral infarction due to unspecified occlusion or stenosis of unspecified cerebral artery: Secondary | ICD-10-CM

## 2017-12-17 DIAGNOSIS — Q211 Atrial septal defect: Secondary | ICD-10-CM

## 2017-12-17 LAB — POCT INR: INR: 3 (ref 2.0–3.0)

## 2017-12-18 ENCOUNTER — Telehealth: Payer: Self-pay | Admitting: Pharmacist

## 2017-12-18 NOTE — Telephone Encounter (Signed)
Received phone call from [facility] INR monitoring that Medicare stopped covering chronic afib (I48.2) and persistent afib (I48.1) diagnoses. They are faxing over a new form so that we can update diagnosis codes. They still accept permanent afib, long-standing persistent or paroxysmal afib diagnosis codes.

## 2017-12-31 ENCOUNTER — Ambulatory Visit (INDEPENDENT_AMBULATORY_CARE_PROVIDER_SITE_OTHER): Payer: Medicare Other | Admitting: Pharmacist

## 2017-12-31 DIAGNOSIS — Q211 Atrial septal defect: Secondary | ICD-10-CM

## 2017-12-31 DIAGNOSIS — I4892 Unspecified atrial flutter: Secondary | ICD-10-CM

## 2017-12-31 DIAGNOSIS — I4891 Unspecified atrial fibrillation: Secondary | ICD-10-CM

## 2017-12-31 DIAGNOSIS — Z5181 Encounter for therapeutic drug level monitoring: Secondary | ICD-10-CM | POA: Diagnosis not present

## 2017-12-31 DIAGNOSIS — I480 Paroxysmal atrial fibrillation: Secondary | ICD-10-CM | POA: Diagnosis not present

## 2017-12-31 DIAGNOSIS — Z7901 Long term (current) use of anticoagulants: Secondary | ICD-10-CM | POA: Diagnosis not present

## 2017-12-31 DIAGNOSIS — Q2112 Patent foramen ovale: Secondary | ICD-10-CM

## 2017-12-31 DIAGNOSIS — I6350 Cerebral infarction due to unspecified occlusion or stenosis of unspecified cerebral artery: Secondary | ICD-10-CM | POA: Diagnosis not present

## 2017-12-31 LAB — POCT INR: INR: 2.7 (ref 2.0–3.0)

## 2018-01-14 ENCOUNTER — Ambulatory Visit (INDEPENDENT_AMBULATORY_CARE_PROVIDER_SITE_OTHER): Payer: Medicare Other

## 2018-01-14 DIAGNOSIS — Z5181 Encounter for therapeutic drug level monitoring: Secondary | ICD-10-CM | POA: Diagnosis not present

## 2018-01-14 DIAGNOSIS — I4891 Unspecified atrial fibrillation: Secondary | ICD-10-CM

## 2018-01-14 DIAGNOSIS — Q211 Atrial septal defect: Secondary | ICD-10-CM | POA: Diagnosis not present

## 2018-01-14 DIAGNOSIS — I6350 Cerebral infarction due to unspecified occlusion or stenosis of unspecified cerebral artery: Secondary | ICD-10-CM | POA: Diagnosis not present

## 2018-01-14 DIAGNOSIS — I4892 Unspecified atrial flutter: Secondary | ICD-10-CM

## 2018-01-14 DIAGNOSIS — Q2112 Patent foramen ovale: Secondary | ICD-10-CM

## 2018-01-14 LAB — POCT INR: INR: 2.2 (ref 2.0–3.0)

## 2018-01-14 NOTE — Patient Instructions (Signed)
Description   Spoke with patient and instructed her to take an extra 1/2 tablet today (pt already took) and tomorrow, then resume same dosage 5mg  daily except 2.5mg  on Tuesdays, Thursdays, and Saturdays.  Call [facility] # [phone] with any changes new medications or if scheduled for any procedures.  Recheck INR in 2 weeks.

## 2018-01-28 ENCOUNTER — Ambulatory Visit (INDEPENDENT_AMBULATORY_CARE_PROVIDER_SITE_OTHER): Payer: Medicare Other | Admitting: Pharmacist

## 2018-01-28 DIAGNOSIS — Z5181 Encounter for therapeutic drug level monitoring: Secondary | ICD-10-CM | POA: Diagnosis not present

## 2018-01-28 DIAGNOSIS — I4892 Unspecified atrial flutter: Secondary | ICD-10-CM | POA: Diagnosis not present

## 2018-01-28 DIAGNOSIS — Q2112 Patent foramen ovale: Secondary | ICD-10-CM

## 2018-01-28 DIAGNOSIS — I6350 Cerebral infarction due to unspecified occlusion or stenosis of unspecified cerebral artery: Secondary | ICD-10-CM | POA: Diagnosis not present

## 2018-01-28 DIAGNOSIS — I4891 Unspecified atrial fibrillation: Secondary | ICD-10-CM | POA: Diagnosis not present

## 2018-01-28 DIAGNOSIS — Q211 Atrial septal defect: Secondary | ICD-10-CM

## 2018-01-28 LAB — POCT INR: INR: 3.1 — AB (ref 2.0–3.0)

## 2018-02-05 ENCOUNTER — Telehealth: Payer: Self-pay | Admitting: Hematology and Oncology

## 2018-02-05 NOTE — Telephone Encounter (Signed)
R/s appt from 12/11 to 12/16 due to [place] leaving office - left message for patient with appt date and time

## 2018-02-06 ENCOUNTER — Inpatient Hospital Stay: Payer: Medicare Other | Admitting: Hematology and Oncology

## 2018-02-11 ENCOUNTER — Inpatient Hospital Stay: Payer: Medicare Other

## 2018-02-11 ENCOUNTER — Inpatient Hospital Stay: Payer: Medicare Other | Attending: Hematology and Oncology | Admitting: Nurse Practitioner

## 2018-02-11 ENCOUNTER — Ambulatory Visit (INDEPENDENT_AMBULATORY_CARE_PROVIDER_SITE_OTHER): Payer: Medicare Other | Admitting: Internal Medicine

## 2018-02-11 VITALS — BP 146/65 | HR 66 | Temp 97.8°F | Resp 18 | Ht 67.5 in | Wt 154.9 lb

## 2018-02-11 DIAGNOSIS — I4892 Unspecified atrial flutter: Secondary | ICD-10-CM | POA: Diagnosis not present

## 2018-02-11 DIAGNOSIS — D751 Secondary polycythemia: Secondary | ICD-10-CM | POA: Insufficient documentation

## 2018-02-11 DIAGNOSIS — I4891 Unspecified atrial fibrillation: Secondary | ICD-10-CM

## 2018-02-11 DIAGNOSIS — Q211 Atrial septal defect: Secondary | ICD-10-CM

## 2018-02-11 DIAGNOSIS — Z5181 Encounter for therapeutic drug level monitoring: Secondary | ICD-10-CM

## 2018-02-11 DIAGNOSIS — I480 Paroxysmal atrial fibrillation: Secondary | ICD-10-CM | POA: Diagnosis not present

## 2018-02-11 DIAGNOSIS — Z7982 Long term (current) use of aspirin: Secondary | ICD-10-CM | POA: Insufficient documentation

## 2018-02-11 DIAGNOSIS — Q2112 Patent foramen ovale: Secondary | ICD-10-CM

## 2018-02-11 DIAGNOSIS — Z79899 Other long term (current) drug therapy: Secondary | ICD-10-CM | POA: Diagnosis not present

## 2018-02-11 DIAGNOSIS — I6350 Cerebral infarction due to unspecified occlusion or stenosis of unspecified cerebral artery: Secondary | ICD-10-CM

## 2018-02-11 DIAGNOSIS — Z7901 Long term (current) use of anticoagulants: Secondary | ICD-10-CM | POA: Insufficient documentation

## 2018-02-11 DIAGNOSIS — D649 Anemia, unspecified: Secondary | ICD-10-CM

## 2018-02-11 LAB — CBC WITH DIFFERENTIAL (CANCER CENTER ONLY)
Abs Immature Granulocytes: 0.01 10*3/uL (ref 0.00–0.07)
Basophils Absolute: 0 10*3/uL (ref 0.0–0.1)
Basophils Relative: 1 %
EOS ABS: 0.1 10*3/uL (ref 0.0–0.5)
Eosinophils Relative: 2 %
HEMATOCRIT: 41.6 % (ref 36.0–46.0)
Hemoglobin: 14.9 g/dL (ref 12.0–15.0)
IMMATURE GRANULOCYTES: 0 %
LYMPHS ABS: 1.6 10*3/uL (ref 0.7–4.0)
Lymphocytes Relative: 29 %
MCH: 31.7 pg (ref 26.0–34.0)
MCHC: 35.8 g/dL (ref 30.0–36.0)
MCV: 88.5 fL (ref 80.0–100.0)
MONOS PCT: 9 %
Monocytes Absolute: 0.5 10*3/uL (ref 0.1–1.0)
NEUTROS PCT: 59 %
Neutro Abs: 3.2 10*3/uL (ref 1.7–7.7)
Platelet Count: 189 10*3/uL (ref 150–400)
RBC: 4.7 MIL/uL (ref 3.87–5.11)
RDW: 12.1 % (ref 11.5–15.5)
WBC Count: 5.5 10*3/uL (ref 4.0–10.5)
nRBC: 0 % (ref 0.0–0.2)

## 2018-02-11 LAB — POCT INR: INR: 3 (ref 2.0–3.0)

## 2018-02-11 NOTE — Patient Instructions (Signed)
Description   Spoke with patient and instructed her to continue same dosage 5mg daily except 2.5mg on Tuesdays, Thursdays, and Saturdays. Call [facility] # [phone] with any changes new medications or if scheduled for any procedures. Recheck INR in 2 weeks.      

## 2018-02-11 NOTE — Progress Notes (Signed)
CONSULT NOTE  Patient Care Team: Crecencio Mc, MD as PCP - General (Internal Medicine) Evans Lance, MD as PCP - Electrophysiology (Cardiology) Garvin Fila, MD as Consulting Physician (Neurology) Annia Belt, MD as Consulting Physician (Hematology) Ethelle Lyon as Consulting Physician (Cardiology)  CHIEF COMPLAINTS - Here for follow up of mild borderline erythrocytosis of unclear etiology over a lengthy but indeterminate period,   HISTORY OF PRESENTING ILLNESS:  Kendra [name] 72 y.o. female reports she has had mildly elevated hemoglobin since 1967. She has intermittently had normal levels, but her most recent CBC in September was 15.1. She returned today stating she feels fine and does not wish to be followed anymore as the extensive work up done by Dr. Audelia Hives did not yield any specific cause. Her labs today are normal with a hemoglobin of 14.9 and hematocrit of 41.6.    Also she does not see a need to continue lab draws here when she can have it checked at her primary care physician. I agree with her.  Please see Dr. Payton Spark note of 11/20/2017 for more extensive work up.        MEDICAL HISTORY:  Past Medical History:  Diagnosis Date  . Arthus phenomenon   . Atrial fibrillation ([place])   . Atrial flutter ([place])   . Chronic anticoagulation 12/14/2015  . 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 GERD (gastroesophageal reflux disease)   . Hemorrhoids   . Hx of blood clots   . Hypercholesterolemia   . Kidney infarction [facility])   . Stroke ([place])   . TIA (transient ischemic attack)    as per 07/30/00 note from [place]: Past Surgical History:  Procedure Laterality Date  . ablasion    . APPENDECTOMY    . BREAST BIOPSY Right   . CARDIAC ELECTROPHYSIOLOGY STUDY AND ABLATION    . CESAREAN SECTION    . fibroid tumor removal  1996  . RADIOLOGY WITH ANESTHESIA N/A 02/16/2015   Procedure: RADIOLOGY  WITH ANESTHESIA;  Surgeon: Medication Radiologist, MD;  Location: [place] [facility];  Service: Radiology;  Laterality: N/A;  . TEE WITHOUT CARDIOVERSION N/A 03/08/2015   Procedure: TRANSESOPHAGEAL ECHOCARDIOGRAM (TEE);  Surgeon: [name], MD;  Location: [place];  Service: Cardiovascular;  Laterality: N/A;  . TEE WITHOUT CARDIOVERSION N/A 04/28/2015   Procedure: TRANSESOPHAGEAL ECHOCARDIOGRAM (TEE);  Surgeon: [name], MD;  Location: [facility]OSCOPY;  Service: Cardiovascular;  Laterality: N/A;    SOCIAL HISTORY: Social History   Socioeconomic History  . Marital status: Married    Spouse name: Not on file  . Number of children: 4  . Years of education: Not on file  . Highest education level: Not on file  Occupational History  . 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 Financial resource strain: Not on file  . Food insecurity:    Worry: Not on file    Inability: Not on file  . Transportation needs:    Medical: Not on file    Non-medical: Not on file  Tobacco Use  . Smoking status: Never Smoker  . Smokeless tobacco: Never Used  Substance and Sexual Activity  . Alcohol use: No    Alcohol/week: 0.0 standard drinks  . Drug use: No  . Sexual activity: Not on file  Lifestyle  . Physical activity:    Days per week: Not on file    Minutes  per session: Not on file  . Stress: Not on file  Relationships  . Social connections:    Talks on phone: Not on file    Gets together: Not on file    Attends religious service: Not on file    Active member of club or organization: Not on file    Attends meetings of clubs or organizations: Not on file    Relationship status: Not on file  . Intimate partner violence:    Fear of current or ex partner: Not on file    Emotionally abused: Not on file    Physically abused: Not on file    Forced sexual activity: Not on file  Other Topics Concern  . Not on file  Social History Narrative   ** Merged History Encounter **        Lives with husband   No caffeine drinks     FAMILY HISTORY: Family History  Problem Relation Age of Onset  . Stroke Maternal Grandmother   . Alcohol abuse Father   . Lung cancer Father   . Other Brother        Emotional Illness  . Lung cancer Brother   . Colon cancer Neg Hx     ALLERGIES:  is allergic to amiodarone hcl.  MEDICATIONS:  Current Outpatient Medications  Medication Sig Dispense Refill  . alendronate (FOSAMAX) 70 MG tablet TAKE 1 TABLET EVERY 7 DAYS WITH A FULL GLASS WATER ON AN EMPTY STOMACH 12 tablet 0  . aspirin 81 MG tablet Take 81 mg by mouth daily.    . cholecalciferol (VITAMIN D) 1000 units tablet Take 1,000 Units by mouth daily.    . metoprolol succinate (TOPROL-XL) 100 MG 24 hr tablet TAKE 1 TABLET EVERY DAY WITH OR AFTER A MEAL 90 tablet 3  . rosuvastatin (CRESTOR) 5 MG tablet Take 1 tablet (5 mg total) by mouth daily. 90 tablet 3  . warfarin (COUMADIN) 5 MG tablet TAKE AS DIRECTED BY COUMADIN CLINIC 90 tablet 0  . furosemide (LASIX) 20 MG tablet Take 1 tablet (20 mg total) by mouth daily. 30 tablet 11   No current facility-administered medications for this visit.     REVIEW OF SYSTEMS:   Constitutional: Denies fevers, chills or abnormal night sweats Eyes: Denies blurriness of vision, double vision or watery eyes Ears, nose, mouth, throat, and face: Denies mucositis or sore throat Respiratory: Denies cough, dyspnea or wheezes Cardiovascular: Denies palpitation, chest discomfort or lower extremity swelling Gastrointestinal:  Denies nausea, heartburn or change in bowel habits Skin: Denies abnormal skin rashes Lymphatics: Denies new lymphadenopathy or easy bruising Neurological:Denies numbness, tingling or new weaknesses Behavioral/Psych: Mood is stable, no new changes  All other systems were reviewed with the patient and are negative.  PHYSICAL EXAMINATION: ECOG PERFORMANCE STATUS: 0 - Asymptomatic  Vitals:   02/11/18 1207  BP: (!) 146/65    Pulse: 66  Resp: 18  Temp: 97.8 F (36.6 C)  SpO2: 99%   Filed Weights   02/11/18 1207  Weight: 154 lb 14.4 oz (70.3 kg)    GENERAL:alert, no distress and comfortable SKIN: skin color, texture, turgor are normal, no rashes or significant lesions EYES: normal, conjunctiva are pink and non-injected, sclera clear OROPHARYNX:no exudate, no erythema and lips, buccal mucosa, and tongue normal  NECK: supple, thyroid normal size, non-tender, without nodularity LYMPH:  no palpable lymphadenopathy in the cervical, axillary or inguinal LUNGS: clear to auscultation and percussion with normal breathing effort HEART: regular rate & rhythm and  no murmurs and no lower extremity edema ABDOMEN:abdomen soft, non-tender and normal bowel sounds Musculoskeletal:no cyanosis of digits and no clubbing  PSYCH: alert & oriented x 3 with fluent speech NEURO: no focal motor/sensory deficits  LABORATORY DATA:  I have reviewed the data as listed Recent Results (from the past 2160 hour(s))  Erythropoietin     Status: None   Collection Time: 11/20/17 12:52 PM  Result Value Ref Range   Erythropoietin 8.7 2.6 - 18.5 mIU/mL    Comment: (NOTE) [employer] Coul[employer] UniCel DxI 800 Immunoassay System Values obtained with different assay methods or kits cannot be used interchangeably. Results cannot be interpreted as absolute evidence of the presence or absence of malignant disease. Performed At: [facility] [place], [place] [id number] Rush Farmer MD PX:[id number]   Comprehensive metabolic panel     Status: None   Collection Time: 11/20/17 12:52 PM  Result Value Ref Range   Sodium 138 135 - 145 mmol/L   Potassium 4.8 3.5 - 5.1 mmol/L   Chloride 103 98 - 111 mmol/L   CO2 28 22 - 32 mmol/L   Glucose, Bld 89 70 - 99 mg/dL   BUN 13 8 - 23 mg/dL   Creatinine, Ser 0.85 0.44 - 1.00 mg/dL   Calcium 9.5 8.9 - 10.3 mg/dL   Total Protein 7.4 6.5 - 8.1 g/dL   Albumin 4.0 3.5 - 5.0 g/dL   AST  22 15 - 41 U/L   ALT 23 0 - 44 U/L   Alkaline Phosphatase 95 38 - 126 U/L   Total Bilirubin 0.7 0.3 - 1.2 mg/dL   GFR calc non Af Amer >60 >60 mL/min   GFR calc Af Amer >60 >60 mL/min    Comment: (NOTE) The eGFR has been calculated using the CKD EPI equation. This calculation has not been validated in all clinical situations. eGFR's persistently <60 mL/min signify possible Chronic Kidney Disease.    Anion gap 7 5 - 15    Comment: Performed at [facility], [address], [place], [place] [address]  CBC with Differential/Platelet     Status: None   Collection Time: 11/20/17 12:55 PM  Result Value Ref Range   WBC 6.9 3.9 - 10.3 K/uL   RBC 4.73 3.70 - 5.45 MIL/uL   Hemoglobin 15.1 11.6 - 15.9 g/dL   HCT 42.4 34.8 - 46.6 %   MCV 89.6 79.5 - 101.0 fL   MCH 31.9 25.1 - 34.0 pg   MCHC 35.6 31.5 - 36.0 g/dL   RDW 13.0 11.2 - 14.5 %   Platelets 215 145 - 400 K/uL   Neutrophils Relative % 58 %   Neutro Abs 4.0 1.5 - 6.5 K/uL   Lymphocytes Relative 30 %   Lymphs Abs 2.1 0.9 - 3.3 K/uL   Monocytes Relative 10 %   Monocytes Absolute 0.7 0.1 - 0.9 K/uL   Eosinophils Relative 2 %   Eosinophils Absolute 0.1 0.0 - 0.5 K/uL   Basophils Relative 0 %   Basophils Absolute 0.0 0.0 - 0.1 K/uL    Comment: Performed at [facility], [place] [address], [place], [place] [address]  POCT INR     Status: None   Collection Time: 11/21/17 12:00 AM  Result Value Ref Range   INR 2.1 2.0 - 3.0    Comment: Self Tester  POCT INR     Status: None   Collection Time: 12/03/17 12:00 AM  Result Value Ref Range  INR 3.0 2.0 - 3.0    Comment: self-tester  POCT INR     Status: None   Collection Time: 12/17/17 12:00 AM  Result Value Ref Range   INR 3.0 2.0 - 3.0    Comment: self tester  POCT INR     Status: None   Collection Time: 12/31/17 12:00 AM  Result Value Ref Range   INR 2.7 2.0 - 3.0    Comment: self tester  POCT INR     Status: None   Collection  Time: 01/14/18 12:00 AM  Result Value Ref Range   INR 2.2 2.0 - 3.0    Comment: self tester  POCT INR     Status: Abnormal   Collection Time: 01/28/18 12:00 AM  Result Value Ref Range   INR 3.1 (A) 2.0 - 3.0    Comment: self tester  POCT INR     Status: None   Collection Time: 02/11/18 12:00 AM  Result Value Ref Range   INR 3.0 2.0 - 3.0    Comment: Self Tester      ASSESSMENT & PLAN:   History of borderline erythrocytosis, now completely resolved.  She has a hemoglobin which runs on the high side which has been present since 1967.  She will continue to have her labs monitored by her PCP and will return to [place] as needed.  All questions were answered. The patient knows to call the clinic with any problems, questions or concerns. I spent 15 minutes counseling the patient face to face. The total time spent in the appointment was 20 minutes and more than 50% was on counseling. Case discussed with Dr. Irene Limbo. She will follow up as needed in the future. Her PCP will be monitoring her lab values.    Bill Salinas, NP 02/11/18 12:44 PM

## 2018-02-14 ENCOUNTER — Telehealth: Payer: Self-pay | Admitting: *Deleted

## 2018-02-14 NOTE — Telephone Encounter (Signed)
"  [name] calling to speak with [name]; do not know first name.  Just missed a call from this number from her."  No call note documentation at this time.   Provider of 02-11-2018 office visit off today.   Further assessment [name] denies "No signs and symptoms, I'm fine.  I do not need an appointment.  Whoever called did not leave message.  I answered call on last ring, saw the number, called right back..  Returning call, I do not need anything"  Suggested call may be returned and to check phone.  Message may have been initiated when tried to answer.

## 2018-02-22 ENCOUNTER — Telehealth: Payer: Self-pay | Admitting: *Deleted

## 2018-02-22 NOTE — Telephone Encounter (Signed)
Received a voicemail from the pt and she stated that she neede [place] to call her back regarding her receiving a letter from [employer] and the letter stated that her services will be discontinued on 02/27/18 since required. Therefore, called the [place] office and spoke with Rodena Piety and she stated that the paperwork was received on 12/27/17 and the pt was good to go and her services wil be continued. Called the pt back and made her aware of this and she was grateful for the assistance.

## 2018-02-26 ENCOUNTER — Ambulatory Visit: Payer: Self-pay | Admitting: Cardiology

## 2018-02-26 DIAGNOSIS — I6350 Cerebral infarction due to unspecified occlusion or stenosis of unspecified cerebral artery: Secondary | ICD-10-CM

## 2018-02-26 DIAGNOSIS — Z5181 Encounter for therapeutic drug level monitoring: Secondary | ICD-10-CM

## 2018-02-26 DIAGNOSIS — I4891 Unspecified atrial fibrillation: Secondary | ICD-10-CM

## 2018-02-26 DIAGNOSIS — Q211 Atrial septal defect: Secondary | ICD-10-CM

## 2018-02-26 DIAGNOSIS — I4892 Unspecified atrial flutter: Secondary | ICD-10-CM

## 2018-02-26 DIAGNOSIS — Q2112 Patent foramen ovale: Secondary | ICD-10-CM

## 2018-02-26 LAB — POCT INR: INR: 2.4 (ref 2.0–3.0)

## 2018-02-26 NOTE — Patient Instructions (Signed)
Description   Spoke with patient and instructed her to take 5mg  then  continue same dosage 5mg  daily except 2.5mg  on Tuesdays, Thursdays, and Saturdays.  Call [facility] # [phone] with any changes new medications or if scheduled for any procedures.  Recheck INR in 2 weeks.

## 2018-03-04 DIAGNOSIS — Z1231 Encounter for screening mammogram for malignant neoplasm of breast: Secondary | ICD-10-CM | POA: Diagnosis not present

## 2018-03-04 LAB — HM MAMMOGRAPHY

## 2018-03-12 ENCOUNTER — Other Ambulatory Visit: Payer: Self-pay | Admitting: Internal Medicine

## 2018-03-12 ENCOUNTER — Ambulatory Visit (INDEPENDENT_AMBULATORY_CARE_PROVIDER_SITE_OTHER): Payer: Medicare Other | Admitting: Cardiovascular Disease

## 2018-03-12 DIAGNOSIS — Q2112 Patent foramen ovale: Secondary | ICD-10-CM

## 2018-03-12 DIAGNOSIS — I4892 Unspecified atrial flutter: Secondary | ICD-10-CM

## 2018-03-12 DIAGNOSIS — I6350 Cerebral infarction due to unspecified occlusion or stenosis of unspecified cerebral artery: Secondary | ICD-10-CM

## 2018-03-12 DIAGNOSIS — Z5181 Encounter for therapeutic drug level monitoring: Secondary | ICD-10-CM | POA: Diagnosis not present

## 2018-03-12 DIAGNOSIS — I4891 Unspecified atrial fibrillation: Secondary | ICD-10-CM

## 2018-03-12 DIAGNOSIS — Q211 Atrial septal defect: Secondary | ICD-10-CM

## 2018-03-12 LAB — POCT INR: INR: 2.9 (ref 2.0–3.0)

## 2018-03-12 NOTE — Patient Instructions (Signed)
Description   Spoke with patient and instructed her to continue same dosage 5mg daily except 2.5mg on Tuesdays, Thursdays, and Saturdays. Call [facility] # [phone] with any changes new medications or if scheduled for any procedures. Recheck INR in 2 weeks.      

## 2018-03-14 ENCOUNTER — Other Ambulatory Visit: Payer: Self-pay | Admitting: Internal Medicine

## 2018-03-21 ENCOUNTER — Ambulatory Visit (INDEPENDENT_AMBULATORY_CARE_PROVIDER_SITE_OTHER): Payer: Medicare Other | Admitting: Internal Medicine

## 2018-03-21 ENCOUNTER — Encounter: Payer: Self-pay | Admitting: Internal Medicine

## 2018-03-21 VITALS — BP 128/74 | HR 66 | Temp 97.3°F | Resp 15 | Ht 67.5 in | Wt 156.8 lb

## 2018-03-21 DIAGNOSIS — D582 Other hemoglobinopathies: Secondary | ICD-10-CM

## 2018-03-21 DIAGNOSIS — E7800 Pure hypercholesterolemia, unspecified: Secondary | ICD-10-CM

## 2018-03-21 DIAGNOSIS — R42 Dizziness and giddiness: Secondary | ICD-10-CM | POA: Diagnosis not present

## 2018-03-21 DIAGNOSIS — I10 Essential (primary) hypertension: Secondary | ICD-10-CM | POA: Diagnosis not present

## 2018-03-21 DIAGNOSIS — E559 Vitamin D deficiency, unspecified: Secondary | ICD-10-CM

## 2018-03-21 DIAGNOSIS — I6350 Cerebral infarction due to unspecified occlusion or stenosis of unspecified cerebral artery: Secondary | ICD-10-CM

## 2018-03-21 DIAGNOSIS — Z7184 Encounter for health counseling related to travel: Secondary | ICD-10-CM

## 2018-03-21 DIAGNOSIS — Z7901 Long term (current) use of anticoagulants: Secondary | ICD-10-CM

## 2018-03-21 DIAGNOSIS — Z8673 Personal history of transient ischemic attack (TIA), and cerebral infarction without residual deficits: Secondary | ICD-10-CM

## 2018-03-21 DIAGNOSIS — E538 Deficiency of other specified B group vitamins: Secondary | ICD-10-CM | POA: Diagnosis not present

## 2018-03-21 DIAGNOSIS — M810 Age-related osteoporosis without current pathological fracture: Secondary | ICD-10-CM | POA: Diagnosis not present

## 2018-03-21 DIAGNOSIS — I480 Paroxysmal atrial fibrillation: Secondary | ICD-10-CM

## 2018-03-21 LAB — LIPID PANEL
Cholesterol: 156 mg/dL (ref 0–200)
HDL: 55.8 mg/dL (ref >39.00)
LDL Cholesterol: 79 mg/dL (ref 0–99)
NonHDL: 100.62
Total CHOL/HDL Ratio: 3
Triglycerides: 110 mg/dL (ref 0.0–149.0)
VLDL: 22 mg/dL (ref 0.0–40.0)

## 2018-03-21 LAB — COMPREHENSIVE METABOLIC PANEL
ALT: 18 U/L (ref 0–35)
AST: 20 U/L (ref 0–37)
Albumin: 4.2 g/dL (ref 3.5–5.2)
Alkaline Phosphatase: 67 U/L (ref 39–117)
BILIRUBIN TOTAL: 0.9 mg/dL (ref 0.2–1.2)
BUN: 15 mg/dL (ref 6–23)
CO2: 27 mEq/L (ref 19–32)
Calcium: 9.3 mg/dL (ref 8.4–10.5)
Chloride: 104 mEq/L (ref 96–112)
Creatinine, Ser: 0.83 mg/dL (ref 0.40–1.20)
GFR: 67.22 mL/min (ref >60.00)
Glucose, Bld: 90 mg/dL (ref 70–99)
Potassium: 4.4 mEq/L (ref 3.5–5.1)
Sodium: 138 mEq/L (ref 135–145)
Total Protein: 6.9 g/dL (ref 6.0–8.3)

## 2018-03-21 LAB — VITAMIN D 25 HYDROXY (VIT D DEFICIENCY, FRACTURES): VITD: 34.22 ng/mL (ref 30.00–100.00)

## 2018-03-21 LAB — VITAMIN B12: Vitamin B-12: 285 pg/mL (ref 211–911)

## 2018-03-21 MED ORDER — OSELTAMIVIR PHOSPHATE 75 MG PO CAPS: 75.0000 mg | ORAL_CAPSULE | Freq: Every day | ORAL | 0 refills | Status: DC

## 2018-03-21 MED ORDER — AMOXICILLIN-POT CLAVULANATE 875-125 MG PO TABS: 1.0000 | ORAL_TABLET | Freq: Two times a day (BID) | ORAL | 0 refills | Status: DC

## 2018-03-21 MED ORDER — ONDANSETRON 4 MG PO TBDP: 4.0000 mg | ORAL_TABLET | Freq: Three times a day (TID) | ORAL | 0 refills | Status: DC | PRN

## 2018-03-21 NOTE — Progress Notes (Addendum)
Patient ID: Kendra [name], female    DOB: August 21, 1945  Age: 73 y.o. MRN: [id number]  The patient is here for follow up and  management of other chronic and acute problems.  Had mammogram jan 6 at [facility]  Reported as normal  Normal Colonoscopy in 2015,  10 yr follow up rec in 2025  DEXA due for follow up on osteoporosis treatment      The risk factors are reflected in the social history.  The roster of all physicians providing medical care to patient - is listed in the Snapshot section of the chart.  Activities of daily living:  The patient is 100% independent in all ADLs: dressing, toileting, feeding as well as independent mobility  Home safety : The patient has smoke detectors in the home. They wear seatbelts.  There are no firearms at home. There is no violence in the home.   There is no risks for hepatitis, STDs or HIV. There is no   history of blood transfusion. They have no travel history to infectious disease endemic areas of the world.  The patient has seen their dentist in the last six month. They have seen their eye doctor in the last year. They deny  hearing difficulty with regard to whispered voices and some television programs.  They have deferred audiologic testing in the last year.  They do not  have excessive sun exposure. Discussed the need for sun protection: hats, long sleeves and use of sunscreen if there is significant sun exposure.   Diet: the importance of a healthy diet is discussed. They do have a healthy diet.  The benefits of regular aerobic exercise were discussed. She walks 4 times per week ,  20 minutes.   Depression screen: there are no signs or vegative symptoms of depression- irritability, change in appetite, anhedonia, sadness/tearfullness.  Cognitive assessment: the patient manages all their financial and personal affairs and is actively engaged. They could relate day,date,year and events; recalled 2/3 objects at 3 minutes; performed clock-face test  normally.  The following portions of the patient's history were reviewed and updated as appropriate: allergies, current medications, past family history, past medical history,  past surgical history, past social history  and problem list.  Visual acuity was not assessed per patient preference since she has regular follow up with her ophthalmologist. Hearing and body mass index were assessed and reviewed.   During the course of the visit the patient was educated and counseled about appropriate screening and preventive services including : fall prevention , diabetes screening, nutrition counseling, colorectal cancer screening, and recommended immunizations.    CC: The primary encounter diagnosis was Age-related osteoporosis without current pathological fracture. 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   She has a past surgical history that includes ablasion; Cesarean section; Breast biopsy (Right); Appendectomy; fibroid tumor removal (1996); Cardiac electrophysiology study and ablation; Radiology with anesthesia (N/A, 02/16/2015); TEE without cardioversion (N/A, 03/08/2015); and TEE without cardioversion (N/A, 04/28/2015).   Her family history includes Alcohol abuse in her father; Lung cancer  in her brother and father; Other in her brother;  Stroke in her maternal grandmother.She reports that she has never smoked. She has never used smokeless tobacco. She reports that she does not drink alcohol or use drugs.  Outpatient Medications Prior to Visit  Medication Sig Dispense Refill  . alendronate (FOSAMAX) 70 MG tablet TAKE 1 TABLET EVERY 7 DAYS WITH A FULL GLASS WATER ON AN EMPTY STOMACH 12 tablet 3  . aspirin 81 MG tablet Take 81 mg by mouth daily.    . cholecalciferol (VITAMIN D) 1000 units tablet Take 1,000 Units by mouth daily.    . metoprolol succinate (TOPROL-XL) 100 MG 24 hr tablet TAKE 1 TABLET EVERY DAY WITH OR AFTER A MEAL 90 tablet 3  . rosuvastatin (CRESTOR) 5 MG tablet TAKE 1 TABLET BY MOUTH EVERY DAY 90 tablet 1  . warfarin (COUMADIN) 5 MG tablet TAKE AS DIRECTED BY COUMADIN CLINIC 90 tablet 0  . furosemide (LASIX) 20 MG tablet Take 1 tablet (20 mg total) by mouth daily. 30 tablet 11   No facility-administered medications prior to visit.     Review of Systems   Patient denies headache, fevers, malaise, unintentional weight loss, skin rash, eye pain, sinus congestion and sinus pain, sore throat, dysphagia,  hemoptysis , cough, dyspnea, wheezing, chest pain, palpitations, orthopnea, edema, abdominal pain, nausea, melena, diarrhea, constipation, flank pain, dysuria, hematuria, urinary  Frequency, nocturia, numbness, tingling, seizures,  Focal weakness, Loss of consciousness,  Tremor, insomnia, depression, anxiety, and suicidal ideation.      Objective:  BP 128/74 (BP Location: Left Arm, Patient Position: Sitting, Cuff Size: Normal)   Pulse 66   Temp (!) 97.3 F (36.3 C) (Oral)   Resp 15   Ht 5' 7.5" (1.715 m)   Wt 156 lb 12.8 oz (71.1 kg)   SpO2 98%   BMI 24.20 kg/m   Physical Exam   General appearance: alert, cooperative and appears stated age Ears: normal TM's and external ear canals both ears Throat: lips, mucosa, and tongue normal; teeth and gums normal Neck: no adenopathy, no carotid bruit, supple,  symmetrical, trachea midline and thyroid not enlarged, symmetric, no tenderness/mass/nodules Back: symmetric, no curvature. ROM normal. No CVA tenderness. Lungs: clear to auscultation bilaterally Heart: regular rate and rhythm, S1, S2 normal, no murmur, click, rub or gallop Abdomen: soft, non-tender; bowel sounds normal; no masses,  no organomegaly Pulses: 2+ and symmetric Skin: Skin color, texture, turgor normal. No rashes or lesions Lymph nodes: Cervical, supraclavicular, and axillary nodes normal.    Assessment & Plan:   Problem List Items Addressed This Visit    B12 deficiency    Recommend oral supplementation for B12 < 300.  Repeat in one month with intrinsic factor antibody screening      Relevant Orders   Vitamin B12   Intrinsic Factor Antibodies   RBC Folate   Chronic anticoagulation    Now managed with coumadin due to treatment failure on Pradaxa      Elevated hemoglobin (HCC)    hgb has been stable for over 2 years without intervention.  She no longer desires follow up with Hemo Onc  Lab Results  Component Value Date   WBC 5.5 02/11/2018   HGB 14.9 02/11/2018   HCT 41.6 02/11/2018   MCV 88.5 02/11/2018   PLT 189 02/11/2018         Essential hypertension    Well controlled on current regimen of metoprolol and lasix. . Renal function stable, no changes today.  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 2 yr follow up DEXA due.       Relevant Orders   DG Bone Density   Paroxysmal atrial fibrillation ([place])    S/p ablation remotely.  Remains in NSR,  Anticoagulation managed by Cardiology       PURE HYPERCHOLESTEROLEMIA    Based on  prior untreated  lipid profile, the risk of clinically significant CAD is 12.5% over the next 10 years, using the Framingham risk calculator.  She is tolerating rosuvastatin.  LFTs are normal  Lab Results  Component Value Date   CHOL 156 03/21/2018   HDL 55.80 03/21/2018   LDLCALC 79 03/21/2018   LDLDIRECT 117.0 03/08/2016   TRIG 110.0 03/21/2018   CHOLHDL 3 03/21/2018   Lab Results  Component Value Date   ALT 18 03/21/2018   AST 20 03/21/2018   ALKPHOS 67 03/21/2018   BILITOT 0.9 03/21/2018         Relevant Orders   Lipid panel (Completed)   Travel advice encounter    She is travelling to [place] during flu season.  In light of her age and the current viral outbreaks,  I have advised  Preventive measures be following including wearing a mask on the plane and flushing sinuses aggressively after travel.  I have sent Tamiflu,  Prednisone taper,  augmentin  and zofran  To her pharmacy with instructions on use.       Vitamin D deficiency   Relevant Orders   VITAMIN D 25 Hydroxy (Vit-D Deficiency, Fractures) (Completed)    Other Visit Diagnoses    Dizziness       Relevant Orders   Vitamin B12 (Completed)      I am having Maeryn M. Brackins start on oseltamivir, amoxicillin-clavulanate, and ondansetron. I am also having her maintain her aspirin, cholecalciferol, furosemide, metoprolol succinate, warfarin, rosuvastatin, and alendronate.  Meds ordered this encounter  Medications  . oseltamivir (TAMIFLU) 75 MG capsule    Sig: Take 1 capsule (75 mg total) by mouth daily.    Dispense:  10 capsule    Refill:  0  . amoxicillin-clavulanate (AUGMENTIN) 875-125 MG tablet    Sig: Take 1 tablet by mouth 2 (two) times daily.    Dispense:  14 tablet    Refill:  0  . ondansetron (ZOFRAN ODT) 4 MG disintegrating tablet    Sig: Take 1 tablet (4 mg total) by mouth every 8 (eight) hours as needed for nausea or vomiting.    Dispense:  20 tablet    Refill:  0   A total of 25 minutes of face  to face time was spent with patient more than half of which was spent in counselling about the above mentioned conditions  and coordination of care  There are no discontinued medications.  Follow-up: Return in about 1 year (around 03/22/2019).   Crecencio Mc, MD

## 2018-03-21 NOTE — Patient Instructions (Addendum)
Next colonoscopy is due in 2025.    DEXA scan has been ordered   I recommend getting the majority of your calcium and Vitamin D  through diet rather than supplements given the recent association of calcium supplements with increased coronary artery calcium scores (You need 1800 mg daily )   Unsweetened soy, almond and coconut milk are all great sources  Of  low carb, cholesterol free  way to increase your dietary calcium and vitamin D.    I have sent Tamiflu,  augmentin and zofran for nausea) to your pharmacy to take on your [place] trip.  Start the tamiflu once daily if you have been exposed to the flu but have no symptoms.  Convert to twice daily if you develop flu symptoms after a known exposure and call or a refill   Do NOT  Start the antibiotic  For a viral URI, which is what most infections passed on a plane are from.  Start the prednisone and take an oral decongestant (sudafed PE)   And benadryl/Delsym for nighttime cough.   If you start the antibiotic ,   Please take a probiotic ( Align, Floraque or Culturelle), the generic version of one of these over the counter medications, or an alternative form (kombucha,  Yogurt, or another dietary source) for a minimum of 3 weeks to prevent a serious antibiotic associated diarrhea  Called clostridium dificile colitis.  Taking a probiotic may also prevent vaginitis due to yeast infections and can be continued indefinitely if you feel that it improves your digestion or your elimination (bowels)

## 2018-03-23 DIAGNOSIS — E538 Deficiency of other specified B group vitamins: Secondary | ICD-10-CM | POA: Insufficient documentation

## 2018-03-23 DIAGNOSIS — Z7184 Encounter for health counseling related to travel: Secondary | ICD-10-CM | POA: Insufficient documentation

## 2018-03-23 NOTE — Assessment & Plan Note (Signed)
S/p ablation remotely.  Remains in NSR,  Anticoagulation managed by Cardiology

## 2018-03-23 NOTE — Assessment & Plan Note (Signed)
She is travelling to [place] during flu season.  In light of her age and the current viral outbreaks,  I have advised  Preventive measures be following including wearing a mask on the plane and flushing sinuses aggressively after travel.  I have sent Tamiflu,  Prednisone taper,  augmentin  and zofran  To her pharmacy with instructions on use.

## 2018-03-23 NOTE — Assessment & Plan Note (Signed)
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

## 2018-03-23 NOTE — Assessment & Plan Note (Addendum)
Well controlled on current regimen of metoprolol and lasix. . Renal function stable, no changes today.  Lab Results  Component Value Date   CREATININE 0.83 03/21/2018   Lab Results  Component Value Date   NA 138 03/21/2018   K 4.4 03/21/2018   CL 104 03/21/2018   CO2 27 03/21/2018

## 2018-03-23 NOTE — Assessment & Plan Note (Signed)
Managed with alendronate . 2 yr follow up DEXA due.

## 2018-03-23 NOTE — Assessment & Plan Note (Signed)
hgb has been stable for over 2 years without intervention.  She no longer desires follow up with Hemo Onc  Lab Results  Component Value Date   WBC 5.5 02/11/2018   HGB 14.9 02/11/2018   HCT 41.6 02/11/2018   MCV 88.5 02/11/2018   PLT 189 02/11/2018

## 2018-03-23 NOTE — Assessment & Plan Note (Signed)
Based on prior untreated  lipid profile, the risk of clinically significant CAD is 12.5% over the next 10 years, using the Framingham risk calculator.  She is tolerating rosuvastatin.  LFTs are normal  Lab Results  Component Value Date   CHOL 156 03/21/2018   HDL 55.80 03/21/2018   LDLCALC 79 03/21/2018   LDLDIRECT 117.0 03/08/2016   TRIG 110.0 03/21/2018   CHOLHDL 3 03/21/2018   Lab Results  Component Value Date   ALT 18 03/21/2018   AST 20 03/21/2018   ALKPHOS 67 03/21/2018   BILITOT 0.9 03/21/2018

## 2018-03-23 NOTE — Assessment & Plan Note (Signed)
Recommend oral supplementation for B12 < 300.  Repeat in one month with intrinsic factor antibody screening

## 2018-03-26 ENCOUNTER — Ambulatory Visit (INDEPENDENT_AMBULATORY_CARE_PROVIDER_SITE_OTHER): Payer: Medicare Other | Admitting: Cardiovascular Disease

## 2018-03-26 DIAGNOSIS — Q2112 Patent foramen ovale: Secondary | ICD-10-CM

## 2018-03-26 DIAGNOSIS — I480 Paroxysmal atrial fibrillation: Secondary | ICD-10-CM | POA: Diagnosis not present

## 2018-03-26 DIAGNOSIS — Q211 Atrial septal defect: Secondary | ICD-10-CM | POA: Diagnosis not present

## 2018-03-26 DIAGNOSIS — I6350 Cerebral infarction due to unspecified occlusion or stenosis of unspecified cerebral artery: Secondary | ICD-10-CM | POA: Diagnosis not present

## 2018-03-26 DIAGNOSIS — Z5181 Encounter for therapeutic drug level monitoring: Secondary | ICD-10-CM

## 2018-03-26 DIAGNOSIS — Z7901 Long term (current) use of anticoagulants: Secondary | ICD-10-CM | POA: Diagnosis not present

## 2018-03-26 LAB — POCT INR: INR: 2.3 (ref 2.0–3.0)

## 2018-04-03 DIAGNOSIS — M8589 Other specified disorders of bone density and structure, multiple sites: Secondary | ICD-10-CM | POA: Diagnosis not present

## 2018-04-03 LAB — HM DEXA SCAN

## 2018-04-05 ENCOUNTER — Encounter: Payer: Self-pay | Admitting: Internal Medicine

## 2018-04-05 ENCOUNTER — Telehealth: Payer: Self-pay | Admitting: Internal Medicine

## 2018-04-09 ENCOUNTER — Ambulatory Visit (INDEPENDENT_AMBULATORY_CARE_PROVIDER_SITE_OTHER): Payer: Medicare Other | Admitting: Cardiology

## 2018-04-09 DIAGNOSIS — Q2112 Patent foramen ovale: Secondary | ICD-10-CM

## 2018-04-09 DIAGNOSIS — Q211 Atrial septal defect: Secondary | ICD-10-CM

## 2018-04-09 DIAGNOSIS — I6350 Cerebral infarction due to unspecified occlusion or stenosis of unspecified cerebral artery: Secondary | ICD-10-CM | POA: Diagnosis not present

## 2018-04-09 DIAGNOSIS — I480 Paroxysmal atrial fibrillation: Secondary | ICD-10-CM | POA: Diagnosis not present

## 2018-04-09 DIAGNOSIS — Z5181 Encounter for therapeutic drug level monitoring: Secondary | ICD-10-CM | POA: Diagnosis not present

## 2018-04-09 LAB — POCT INR: INR: 2.6 (ref 2.0–3.0)

## 2018-04-09 NOTE — Patient Instructions (Signed)
Description   Spoke with patient and instructed her to continue same dosage 5mg daily except 2.5mg on Tuesdays, Thursdays, and Saturdays. Call [facility] # [phone] with any changes new medications or if scheduled for any procedures. Recheck INR in 2 weeks.      

## 2018-04-23 ENCOUNTER — Ambulatory Visit (INDEPENDENT_AMBULATORY_CARE_PROVIDER_SITE_OTHER): Payer: Medicare Other | Admitting: Pharmacist

## 2018-04-23 DIAGNOSIS — Z5181 Encounter for therapeutic drug level monitoring: Secondary | ICD-10-CM

## 2018-04-23 DIAGNOSIS — I6350 Cerebral infarction due to unspecified occlusion or stenosis of unspecified cerebral artery: Secondary | ICD-10-CM

## 2018-04-23 DIAGNOSIS — Q211 Atrial septal defect: Secondary | ICD-10-CM | POA: Diagnosis not present

## 2018-04-23 DIAGNOSIS — I480 Paroxysmal atrial fibrillation: Secondary | ICD-10-CM

## 2018-04-23 DIAGNOSIS — Q2112 Patent foramen ovale: Secondary | ICD-10-CM

## 2018-04-23 LAB — POCT INR: INR: 3.3 — AB (ref 2.0–3.0)

## 2018-04-28 ENCOUNTER — Other Ambulatory Visit: Payer: Self-pay | Admitting: Cardiovascular Disease

## 2018-05-07 ENCOUNTER — Ambulatory Visit (INDEPENDENT_AMBULATORY_CARE_PROVIDER_SITE_OTHER): Payer: Medicare Other | Admitting: Cardiology

## 2018-05-07 DIAGNOSIS — I480 Paroxysmal atrial fibrillation: Secondary | ICD-10-CM | POA: Diagnosis not present

## 2018-05-07 DIAGNOSIS — I6350 Cerebral infarction due to unspecified occlusion or stenosis of unspecified cerebral artery: Secondary | ICD-10-CM | POA: Diagnosis not present

## 2018-05-07 DIAGNOSIS — Z5181 Encounter for therapeutic drug level monitoring: Secondary | ICD-10-CM

## 2018-05-07 DIAGNOSIS — Q211 Atrial septal defect: Secondary | ICD-10-CM

## 2018-05-07 DIAGNOSIS — Q2112 Patent foramen ovale: Secondary | ICD-10-CM

## 2018-05-07 LAB — POCT INR: INR: 2.7 (ref 2.0–3.0)

## 2018-05-21 ENCOUNTER — Ambulatory Visit (INDEPENDENT_AMBULATORY_CARE_PROVIDER_SITE_OTHER): Payer: Medicare Other | Admitting: Cardiology

## 2018-05-21 DIAGNOSIS — Z7901 Long term (current) use of anticoagulants: Secondary | ICD-10-CM | POA: Diagnosis not present

## 2018-05-21 DIAGNOSIS — Q211 Atrial septal defect: Secondary | ICD-10-CM

## 2018-05-21 DIAGNOSIS — Z5181 Encounter for therapeutic drug level monitoring: Secondary | ICD-10-CM

## 2018-05-21 DIAGNOSIS — I6350 Cerebral infarction due to unspecified occlusion or stenosis of unspecified cerebral artery: Secondary | ICD-10-CM | POA: Diagnosis not present

## 2018-05-21 DIAGNOSIS — Q2112 Patent foramen ovale: Secondary | ICD-10-CM

## 2018-05-21 DIAGNOSIS — I480 Paroxysmal atrial fibrillation: Secondary | ICD-10-CM

## 2018-05-21 LAB — POCT INR: INR: 3.3 — AB (ref 2.0–3.0)

## 2018-06-04 ENCOUNTER — Ambulatory Visit (INDEPENDENT_AMBULATORY_CARE_PROVIDER_SITE_OTHER): Payer: Medicare Other | Admitting: Cardiovascular Disease

## 2018-06-04 DIAGNOSIS — I6350 Cerebral infarction due to unspecified occlusion or stenosis of unspecified cerebral artery: Secondary | ICD-10-CM

## 2018-06-04 DIAGNOSIS — I480 Paroxysmal atrial fibrillation: Secondary | ICD-10-CM | POA: Diagnosis not present

## 2018-06-04 DIAGNOSIS — Q211 Atrial septal defect: Secondary | ICD-10-CM | POA: Diagnosis not present

## 2018-06-04 DIAGNOSIS — Q2112 Patent foramen ovale: Secondary | ICD-10-CM

## 2018-06-04 DIAGNOSIS — Z5181 Encounter for therapeutic drug level monitoring: Secondary | ICD-10-CM

## 2018-06-04 LAB — POCT INR: INR: 3.1 — AB (ref 2.0–3.0)

## 2018-06-18 ENCOUNTER — Ambulatory Visit (INDEPENDENT_AMBULATORY_CARE_PROVIDER_SITE_OTHER): Payer: Medicare Other | Admitting: Interventional Cardiology

## 2018-06-18 ENCOUNTER — Encounter: Payer: Self-pay | Admitting: *Deleted

## 2018-06-18 DIAGNOSIS — I6350 Cerebral infarction due to unspecified occlusion or stenosis of unspecified cerebral artery: Secondary | ICD-10-CM | POA: Diagnosis not present

## 2018-06-18 DIAGNOSIS — I480 Paroxysmal atrial fibrillation: Secondary | ICD-10-CM

## 2018-06-18 DIAGNOSIS — Q2112 Patent foramen ovale: Secondary | ICD-10-CM

## 2018-06-18 DIAGNOSIS — Z5181 Encounter for therapeutic drug level monitoring: Secondary | ICD-10-CM | POA: Diagnosis not present

## 2018-06-18 DIAGNOSIS — Q211 Atrial septal defect: Secondary | ICD-10-CM

## 2018-06-18 LAB — POCT INR: INR: 2.9 (ref 2.0–3.0)

## 2018-06-18 NOTE — Patient Instructions (Signed)
Description   Spoke with patient and instructed her to continue same dosage 5mg  daily except 2.5mg  on Tuesdays, Thursdays, and Saturdays.  Call [facility] # [phone] with any changes new medications or if scheduled for any procedures.  Recheck INR in 2 weeks.

## 2018-06-18 NOTE — Telephone Encounter (Signed)
This encounter was created in error - please disregard.

## 2018-07-02 ENCOUNTER — Ambulatory Visit (INDEPENDENT_AMBULATORY_CARE_PROVIDER_SITE_OTHER): Payer: Medicare Other | Admitting: Pharmacist Clinician (PhC)/ Clinical Pharmacy Specialist

## 2018-07-02 DIAGNOSIS — Z5181 Encounter for therapeutic drug level monitoring: Secondary | ICD-10-CM | POA: Diagnosis not present

## 2018-07-02 DIAGNOSIS — I480 Paroxysmal atrial fibrillation: Secondary | ICD-10-CM | POA: Diagnosis not present

## 2018-07-02 DIAGNOSIS — Q211 Atrial septal defect: Secondary | ICD-10-CM

## 2018-07-02 DIAGNOSIS — Z7901 Long term (current) use of anticoagulants: Secondary | ICD-10-CM | POA: Diagnosis not present

## 2018-07-02 DIAGNOSIS — I6350 Cerebral infarction due to unspecified occlusion or stenosis of unspecified cerebral artery: Secondary | ICD-10-CM

## 2018-07-02 DIAGNOSIS — Q2112 Patent foramen ovale: Secondary | ICD-10-CM

## 2018-07-02 LAB — POCT INR: INR: 3.3 — AB (ref 2.0–3.0)

## 2018-07-16 ENCOUNTER — Ambulatory Visit (INDEPENDENT_AMBULATORY_CARE_PROVIDER_SITE_OTHER): Payer: Medicare Other | Admitting: Cardiology

## 2018-07-16 DIAGNOSIS — Q2112 Patent foramen ovale: Secondary | ICD-10-CM

## 2018-07-16 DIAGNOSIS — Z5181 Encounter for therapeutic drug level monitoring: Secondary | ICD-10-CM

## 2018-07-16 DIAGNOSIS — I6350 Cerebral infarction due to unspecified occlusion or stenosis of unspecified cerebral artery: Secondary | ICD-10-CM | POA: Diagnosis not present

## 2018-07-16 DIAGNOSIS — Q211 Atrial septal defect: Secondary | ICD-10-CM

## 2018-07-16 DIAGNOSIS — I480 Paroxysmal atrial fibrillation: Secondary | ICD-10-CM

## 2018-07-16 LAB — POCT INR: INR: 3.2 — AB (ref 2.0–3.0)

## 2018-07-23 ENCOUNTER — Other Ambulatory Visit: Payer: Self-pay | Admitting: Cardiovascular Disease

## 2018-07-30 ENCOUNTER — Ambulatory Visit (INDEPENDENT_AMBULATORY_CARE_PROVIDER_SITE_OTHER): Payer: Medicare Other | Admitting: Pharmacist

## 2018-07-30 DIAGNOSIS — I480 Paroxysmal atrial fibrillation: Secondary | ICD-10-CM | POA: Diagnosis not present

## 2018-07-30 DIAGNOSIS — I6350 Cerebral infarction due to unspecified occlusion or stenosis of unspecified cerebral artery: Secondary | ICD-10-CM | POA: Diagnosis not present

## 2018-07-30 DIAGNOSIS — Z5181 Encounter for therapeutic drug level monitoring: Secondary | ICD-10-CM

## 2018-07-30 DIAGNOSIS — Q2112 Patent foramen ovale: Secondary | ICD-10-CM

## 2018-07-30 DIAGNOSIS — Q211 Atrial septal defect: Secondary | ICD-10-CM

## 2018-07-30 LAB — POCT INR: INR: 3.6 — AB (ref 2.0–3.0)

## 2018-08-13 ENCOUNTER — Ambulatory Visit (INDEPENDENT_AMBULATORY_CARE_PROVIDER_SITE_OTHER): Payer: Medicare Other | Admitting: Cardiology

## 2018-08-13 DIAGNOSIS — I480 Paroxysmal atrial fibrillation: Secondary | ICD-10-CM | POA: Diagnosis not present

## 2018-08-13 DIAGNOSIS — I6350 Cerebral infarction due to unspecified occlusion or stenosis of unspecified cerebral artery: Secondary | ICD-10-CM | POA: Diagnosis not present

## 2018-08-13 DIAGNOSIS — Q2112 Patent foramen ovale: Secondary | ICD-10-CM

## 2018-08-13 DIAGNOSIS — Z5181 Encounter for therapeutic drug level monitoring: Secondary | ICD-10-CM

## 2018-08-13 DIAGNOSIS — Q211 Atrial septal defect: Secondary | ICD-10-CM

## 2018-08-13 LAB — POCT INR: INR: 3.5 — AB (ref 2.0–3.0)

## 2018-08-13 NOTE — Patient Instructions (Signed)
Description   Spoke with patient and instructed her to continue same dosage 5mg  daily except 2.5mg  on Tuesdays, Thursdays, and Saturdays. Call Coumadin Clinic # [phone] with any changes new medications or if scheduled for any procedures. Recheck INR in 2 weeks.

## 2018-08-27 ENCOUNTER — Ambulatory Visit (INDEPENDENT_AMBULATORY_CARE_PROVIDER_SITE_OTHER): Payer: Medicare Other | Admitting: Internal Medicine

## 2018-08-27 DIAGNOSIS — Q211 Atrial septal defect: Secondary | ICD-10-CM

## 2018-08-27 DIAGNOSIS — Q2112 Patent foramen ovale: Secondary | ICD-10-CM

## 2018-08-27 DIAGNOSIS — I6350 Cerebral infarction due to unspecified occlusion or stenosis of unspecified cerebral artery: Secondary | ICD-10-CM

## 2018-08-27 DIAGNOSIS — Z5181 Encounter for therapeutic drug level monitoring: Secondary | ICD-10-CM | POA: Diagnosis not present

## 2018-08-27 DIAGNOSIS — Z7901 Long term (current) use of anticoagulants: Secondary | ICD-10-CM | POA: Diagnosis not present

## 2018-08-27 DIAGNOSIS — I480 Paroxysmal atrial fibrillation: Secondary | ICD-10-CM | POA: Diagnosis not present

## 2018-08-27 LAB — POCT INR: INR: 3.2 — AB (ref 2.0–3.0)

## 2018-08-27 NOTE — Patient Instructions (Signed)
Description   Spoke with patient and instructed her to continue same dosage 5mg  daily except 2.5mg  on Tuesdays, Thursdays, and Saturdays. Call [facility] Clinic # [phone] with any changes new medications or if scheduled for any procedures. Recheck INR in 2 weeks.

## 2018-09-10 ENCOUNTER — Other Ambulatory Visit: Payer: Self-pay | Admitting: Internal Medicine

## 2018-09-10 ENCOUNTER — Ambulatory Visit (INDEPENDENT_AMBULATORY_CARE_PROVIDER_SITE_OTHER): Payer: Medicare Other | Admitting: Cardiovascular Disease

## 2018-09-10 DIAGNOSIS — I6350 Cerebral infarction due to unspecified occlusion or stenosis of unspecified cerebral artery: Secondary | ICD-10-CM | POA: Diagnosis not present

## 2018-09-10 DIAGNOSIS — I480 Paroxysmal atrial fibrillation: Secondary | ICD-10-CM | POA: Diagnosis not present

## 2018-09-10 DIAGNOSIS — Z5181 Encounter for therapeutic drug level monitoring: Secondary | ICD-10-CM

## 2018-09-10 DIAGNOSIS — Q2112 Patent foramen ovale: Secondary | ICD-10-CM

## 2018-09-10 DIAGNOSIS — Q211 Atrial septal defect: Secondary | ICD-10-CM | POA: Diagnosis not present

## 2018-09-10 LAB — POCT INR: INR: 3.6 — AB (ref 2.0–3.0)

## 2018-09-10 NOTE — Patient Instructions (Signed)
Description   Spoke with patient and instructed her to eat some extra green leafy veggies today then continue same dosage 5mg  daily except 2.5mg  on Tuesdays, Thursdays, and Saturdays. Call [facility] Clinic # [phone] with any changes new medications or if scheduled for any procedures. Recheck INR in 2 weeks.

## 2018-09-24 ENCOUNTER — Ambulatory Visit (INDEPENDENT_AMBULATORY_CARE_PROVIDER_SITE_OTHER): Payer: Medicare Other | Admitting: *Deleted

## 2018-09-24 DIAGNOSIS — Q2112 Patent foramen ovale: Secondary | ICD-10-CM

## 2018-09-24 DIAGNOSIS — I6350 Cerebral infarction due to unspecified occlusion or stenosis of unspecified cerebral artery: Secondary | ICD-10-CM | POA: Diagnosis not present

## 2018-09-24 DIAGNOSIS — Z5181 Encounter for therapeutic drug level monitoring: Secondary | ICD-10-CM

## 2018-09-24 DIAGNOSIS — Q211 Atrial septal defect: Secondary | ICD-10-CM | POA: Diagnosis not present

## 2018-09-24 DIAGNOSIS — I480 Paroxysmal atrial fibrillation: Secondary | ICD-10-CM

## 2018-09-24 LAB — POCT INR: INR: 3.6 — AB (ref 2.0–3.0)

## 2018-09-24 NOTE — Patient Instructions (Signed)
Description   Spoke with patient and instructed her to hold today's dose then change dose to 2.5mg  daily except 5mg  on Monday, Wednesday, and Friday. Recheck INR in 2 weeks. Call [facility] # [phone] with any changes new medications or if scheduled for any procedures. Recheck INR in 2 weeks.

## 2018-09-25 ENCOUNTER — Other Ambulatory Visit: Payer: Self-pay

## 2018-09-30 ENCOUNTER — Ambulatory Visit (INDEPENDENT_AMBULATORY_CARE_PROVIDER_SITE_OTHER): Payer: Medicare Other

## 2018-09-30 ENCOUNTER — Other Ambulatory Visit: Payer: Self-pay

## 2018-09-30 DIAGNOSIS — Z0000 Encounter for general adult medical examination without abnormal findings: Secondary | ICD-10-CM | POA: Diagnosis not present

## 2018-09-30 NOTE — Progress Notes (Signed)
Subjective:   Kendra [name] is a 73 y.o. female who presents for an Initial Medicare Annual Wellness Visit.  Review of Systems    No ROS.  Medicare Wellness Virtual Visit.  Visual/audio telehealth visit, UTA vital signs.   See social history for additional risk factors.    Cardiac Risk Factors include: advanced age (>20men, >63 women);hypertension     Objective:    Today's Vitals   There is no height or weight on file to calculate BMI.  Advanced Directives 09/30/2018 04/28/2015 02/16/2015 02/13/2015  Does Patient Have a Medical Advance Directive? Yes No No No  Type of Paramedic of [place];Living will - - -  Does patient want to make changes to medical advance directive? No - Patient declined - - -  Copy of [place] in Chart? No - copy requested - - -  Would patient like information on creating a medical advance directive? - - No - patient declined information No - patient declined information  Some encounter information is confidential and restricted. Go to Review Flowsheets activity to see all data.    Current Medications (verified) Outpatient Encounter Medications as of 09/30/2018  Medication Sig  . alendronate (FOSAMAX) 70 MG tablet TAKE 1 TABLET EVERY 7 DAYS WITH A FULL GLASS WATER ON AN EMPTY STOMACH  . aspirin 81 MG tablet Take 81 mg by mouth daily.  . cholecalciferol (VITAMIN D) 1000 units tablet Take 1,000 Units by mouth daily.  . metoprolol succinate (TOPROL-XL) 100 MG 24 hr tablet TAKE 1 TABLET EVERY DAY WITH OR AFTER A MEAL  . ondansetron (ZOFRAN ODT) 4 MG disintegrating tablet Take 1 tablet (4 mg total) by mouth every 8 (eight) hours as needed for nausea or vomiting.  . rosuvastatin (CRESTOR) 5 MG tablet TAKE 1 TABLET BY MOUTH EVERY DAY  . warfarin (COUMADIN) 5 MG tablet TAKE AS DIRECTED BY COUMADIN CLINIC  . [DISCONTINUED] amoxicillin-clavulanate (AUGMENTIN) 875-125 MG tablet Take 1 tablet by mouth 2 (two) times daily.   . furosemide (LASIX) 20 MG tablet Take 1 tablet (20 mg total) by mouth daily.  Marland Kitchen oseltamivir (TAMIFLU) 75 MG capsule Take 1 capsule (75 mg total) by mouth daily. (Patient not taking: Reported on 09/30/2018)   No facility-administered encounter medications on file as of 09/30/2018.     Allergies (verified) Amiodarone hcl   History: Past Medical History:  Diagnosis Date  . Arthus phenomenon   . Atrial fibrillation ([place])   . Atrial flutter ([place])   . Chronic anticoagulation 12/14/2015  . 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 GERD (gastroesophageal reflux disease)   . Hemorrhoids   . Hx of blood clots   . Hypercholesterolemia   . Kidney infarction [facility])   . Stroke ([place])   . TIA (transient ischemic attack)    as per 07/30/00 note from [place]    Past Surgical History:  Procedure Laterality Date  . ablasion    . APPENDECTOMY    . BREAST BIOPSY Right   . CARDIAC ELECTROPHYSIOLOGY STUDY AND ABLATION    . CESAREAN SECTION    . fibroid tumor removal  1996  . RADIOLOGY WITH ANESTHESIA N/A 02/16/2015   Procedure: RADIOLOGY WITH ANESTHESIA;  Surgeon: Medication Radiologist, MD;  Location: [facility] [place] [facility] NEURO ORS;  Service: Radiology;  Laterality: N/A;  . TEE WITHOUT CARDIOVERSION N/A 03/08/2015   Procedure: TRANSESOPHAGEAL ECHOCARDIOGRAM (TEE);  Surgeon: [name], MD;  Location: [place];  Service:  Cardiovascular;  Laterality: N/A;  . TEE WITHOUT CARDIOVERSION N/A 04/28/2015   Procedure: TRANSESOPHAGEAL ECHOCARDIOGRAM (TEE);  Surgeon: [name], MD;  Location[facility] ENDOSCOPY;  Service: Cardiovascular;  Laterality: N/A;   Family History  Problem Relation Age of Onset  . Stroke Maternal Grandmother   . Alcohol abuse Father   . Lung cancer Father   . Other Brother        Emotional Illness  . Lung cancer Brother   . Colon cancer Neg Hx    Social History   Socioeconomic History  . Marital status: Married    Spouse name: Not on file  .  Number of children: 4  . Years of education: Not on file  . Highest education level: Not on file  Occupational History  . 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 Financial resource strain: Not hard at all  . Food insecurity    Worry: Never true    Inability: Never true  . Transportation needs    Medical: No    Non-medical: No  Tobacco Use  . Smoking status: Never Smoker  . Smokeless tobacco: Never Used  Substance and Sexual Activity  . Alcohol use: No    Alcohol/week: 0.0 standard drinks  . Drug use: No  . Sexual activity: Not on file  Lifestyle  . Physical activity    Days per week: 3 days    Minutes per session: 60 min  . Stress: Not at all  Relationships  . Social Herbalist on phone: Not on file    Gets together: Not on file    Attends religious service: Not on file    Active member of club or organization: Not on file    Attends meetings of clubs or organizations: Not on file    Relationship status: Not on file  Other Topics Concern  . Not on file  Social History Narrative   ** Merged History Encounter **       Lives with husband   No caffeine drinks     Tobacco Counseling Counseling given: Not Answered   Clinical Intake:  Pre-visit preparation completed: Yes        Diabetes: No  How often do you need to have someone help you when you read instructions, pamphlets, or other written materials from your doctor or pharmacy?: 1 - Never         Activities of Daily Living In your present state of health, do you have any difficulty performing the following activities: 09/30/2018  Hearing? N  Vision? N  Difficulty concentrating or making decisions? N  Walking or climbing stairs? N  Dressing or bathing? N  Doing errands, shopping? N  Preparing Food and eating ? N  Using the Toilet? N  In the past six months, have you accidently leaked urine? N  Do you have problems with loss of bowel control? N  Managing your Medications? N   Managing your Finances? N  Housekeeping or managing your Housekeeping? N  Some recent data might be hidden     Immunizations and Health Maintenance Immunization History  Administered Date(s) Administered  . Influenza Split 11/28/2014  . Influenza Whole 11/27/2008  . Influenza, High Dose Seasonal PF 11/10/2015  . Influenza, Seasonal, Injecte, Preservative Fre 11/14/2007  . Influenza-Unspecified 12/12/2016, 11/11/2017  . Pneumococcal Conjugate-13 04/21/2014  . Pneumococcal Polysaccharide-23 11/27/2008, 06/21/2010, 03/15/2017  . Td 02/27/2002  . Tdap 12/11/2013, 04/26/2014  . Zoster 04/26/2005, 06/20/2006  . Zoster  Recombinat (Shingrix) 11/11/2017   Health Maintenance Due  Topic Date Due  . INFLUENZA VACCINE  09/28/2018    Patient Care Team: Crecencio Mc, MD as PCP - General (Internal Medicine) Evans Lance, MD as PCP - Electrophysiology (Cardiology) Garvin Fila, MD as Consulting Physician (Neurology) Annia Belt, MD as Consulting Physician (Hematology) Ethelle Lyon as Consulting Physician (Cardiology)  Indicate any recent Medical Services you may have received from other than [facility] providers in the past year (date may be approximate).     Assessment:   This is a routine wellness examination for [place].  I connected with patient 09/30/18 at  9:30 AM EDT by a video/audio enabled telemedicine application and verified that I am speaking with the correct person using two identifiers. Patient stated full name and DOB. Patient gave permission to continue with virtual visit. Patient's location was at home and Nurse's location was at [place] office.   Health Screenings  Mammogram - 02/2018 Colonoscopy - 02/2013 Bone Density - 03/2018 Glaucoma -none Hearing -demonstrates normal hearing during visit. Hepatitis C Screening- 03/2008 POCT INR- 09/26/18 (3.6) Cholesterol - 02/2018 Dental- UTD Vision- visits within the last 12 months.  Social  Alcohol intake - no   Smoking history- never  Smokers in home? none Illicit drug use? none Exercise - hiking 3-4 times weekly, gardening Diet - regular BMI- discussed the importance of a healthy diet, water intake and the benefits of aerobic exercise.  Educational material provided.   Safety  Patient feels safe at home- yes Patient does have smoke detectors at home- yes Patient does wear sunscreen or protective clothing when in direct sunlight -yes Patient does wear seat belt when in a moving vehicle -yes  Covid-19 precautions and sickness symptoms discussed.   Activities of Daily Living Patient denies needing assistance with: driving, household chores, feeding themselves, getting from bed to chair, getting to the toilet, bathing/showering, dressing, managing money, or preparing meals.  No new identified risk were noted.    Depression Screen Patient denies losing interest in daily life, feeling hopeless, or crying easily over simple problems.   Medication-taking as directed and without issues.   Fall Screen Patient denies being afraid of falling or falling in the last year.   Memory Screen Patient is alert.  Patient denies difficulty focusing, concentrating or misplacing items. Correctly identified the president of the [place], season and recall. Patient likes to read for brain stimulation.  Immunizations The following Immunizations were discussed: Influenza, shingles, pneumonia, and tetanus.   Other Providers Patient Care Team: Crecencio Mc, MD as PCP - General (Internal Medicine) Evans Lance, MD as PCP - Electrophysiology (Cardiology) Garvin Fila, MD as Consulting Physician (Neurology) Annia Belt, MD as Consulting Physician (Hematology) Ethelle Lyon as Consulting Physician (Cardiology)  Hearing/Vision screen  Hearing Screening   125Hz  250Hz  500Hz  1000Hz  2000Hz  3000Hz  4000Hz  6000Hz  8000Hz   Right ear:           Left ear:           Comments: Patient is able to hear  conversational tones without difficulty.  No issues reported.  Vision Screening Comments: Wears corrective lenses when reading. Visual acuity not assessed, virtual visit.  They have seen their ophthalmologist in the last 12 months.     Dietary issues and exercise activities discussed: Current Exercise Habits: Home exercise routine, Type of exercise: walking;stretching, Time (Minutes): 60, Frequency (Times/Week): 3, Weekly Exercise (Minutes/Week): 180, Intensity: Mild  Goals    .  Follow up with Primary Care Provider     As needed      Depression Screen PHQ 2/9 Scores 09/30/2018 02/26/2015  PHQ - 2 Score 0 0  Some encounter information is confidential and restricted. Go to Review Flowsheets activity to see all data.    Fall Risk Fall Risk  09/30/2018 04/12/2015 03/05/2015 02/26/2015  Falls in the past year? 0 - - No  Risk for fall due to: Comment - Felled down steps. Stroke 02/16/2015.  -  Some encounter information is confidential and restricted. Go to Review Flowsheets activity to see all data.  Is the patient's home free of loose throw rugs in walkways, pet beds, electrical cords, etc? yes      Grab bars in the bathroom? yes      Handrails on the stairs?  yes      Adequate lighting? yes  Cognitive Function:     6CIT Screen 09/30/2018  What Year? 0 points  What month? 0 points  What time? 0 points  Count back from 20 0 points  Months in reverse 0 points  Repeat phrase 0 points  Total Score 0    Screening Tests Health Maintenance  Topic Date Due  . INFLUENZA VACCINE  09/28/2018  . MAMMOGRAM  03/05/2019  . COLONOSCOPY  03/08/2023  . TETANUS/TDAP  04/26/2024  . DEXA SCAN  Completed  . Hepatitis C Screening  Completed  . PNA vac Low Risk Adult  Completed     Plan:    End of life planning; Advance aging; Advanced directives discussed.  Copy of current HCPOA/Living Will requested.    I have personally reviewed and noted the following in the patient's chart:   . Medical  and social history . Use of alcohol, tobacco or illicit drugs  . Current medications and supplements . Functional ability and status . Nutritional status . Physical activity . Advanced directives . List of other physicians . Hospitalizations, surgeries, and ER visits in previous 12 months . Vitals . Screenings to include cognitive, depression, and falls . Referrals and appointments  In addition, I have reviewed and discussed with patient certain preventive protocols, quality metrics, and best practice recommendations. A written personalized care plan for preventive services as well as general preventive health recommendations were provided to patient.     Varney Biles, LPN   04/28/5496

## 2018-09-30 NOTE — Patient Instructions (Addendum)
  Kendra [name] , Thank you for taking time to come for your Medicare Wellness Visit. I appreciate your ongoing commitment to your health goals. Please review the following plan we discussed and let me know if I can assist you in the future.   These are the goals we discussed: Goals    . Follow up with Primary Care Provider     As needed       This is a list of the screening recommended for you and due dates:  Health Maintenance  Topic Date Due  . Flu Shot  09/28/2018  . Mammogram  03/05/2019  . Colon Cancer Screening  03/08/2023  . Tetanus Vaccine  04/26/2024  . DEXA scan (bone density measurement)  Completed  .  Hepatitis C: One time screening is recommended by Center for Disease Control  (CDC) for  adults born from [birth year] through 1966.   Completed  . Pneumonia vaccines  Completed

## 2018-10-08 ENCOUNTER — Ambulatory Visit (INDEPENDENT_AMBULATORY_CARE_PROVIDER_SITE_OTHER): Payer: Medicare Other | Admitting: Cardiology

## 2018-10-08 DIAGNOSIS — I480 Paroxysmal atrial fibrillation: Secondary | ICD-10-CM | POA: Diagnosis not present

## 2018-10-08 DIAGNOSIS — Z5181 Encounter for therapeutic drug level monitoring: Secondary | ICD-10-CM

## 2018-10-08 DIAGNOSIS — I6350 Cerebral infarction due to unspecified occlusion or stenosis of unspecified cerebral artery: Secondary | ICD-10-CM

## 2018-10-08 DIAGNOSIS — Q2112 Patent foramen ovale: Secondary | ICD-10-CM

## 2018-10-08 DIAGNOSIS — Q211 Atrial septal defect: Secondary | ICD-10-CM

## 2018-10-08 LAB — POCT INR: INR: 2.4 (ref 2.0–3.0)

## 2018-10-08 NOTE — Patient Instructions (Signed)
Description   Spoke with patient and instructed her to take 1 tablet today, then continue to take 2.5mg  daily except 5mg  on Monday, Wednesday, and Friday. Recheck INR in 2 weeks. Call [facility] Clinic # [phone] with any changes new medications or if scheduled for any procedures. Recheck INR in 2 weeks.

## 2018-10-13 ENCOUNTER — Other Ambulatory Visit: Payer: Self-pay | Admitting: Internal Medicine

## 2018-10-22 ENCOUNTER — Ambulatory Visit (INDEPENDENT_AMBULATORY_CARE_PROVIDER_SITE_OTHER): Payer: Medicare Other | Admitting: Cardiovascular Disease

## 2018-10-22 DIAGNOSIS — Z5181 Encounter for therapeutic drug level monitoring: Secondary | ICD-10-CM | POA: Diagnosis not present

## 2018-10-22 DIAGNOSIS — Z8673 Personal history of transient ischemic attack (TIA), and cerebral infarction without residual deficits: Secondary | ICD-10-CM

## 2018-10-22 DIAGNOSIS — I480 Paroxysmal atrial fibrillation: Secondary | ICD-10-CM

## 2018-10-22 DIAGNOSIS — Z7901 Long term (current) use of anticoagulants: Secondary | ICD-10-CM | POA: Diagnosis not present

## 2018-10-22 LAB — POCT INR: INR: 2.7 (ref 2.0–3.0)

## 2018-10-22 NOTE — Patient Instructions (Signed)
Description   Spoke with patient and instructed her to continue taking 2.5mg  daily except 5mg  on Monday, Wednesday, and Friday. Recheck INR in 2 weeks. Call [facility] Clinic # [phone] with any changes new medications or if scheduled for any procedures. Recheck INR in 2 weeks.

## 2018-10-23 ENCOUNTER — Other Ambulatory Visit: Payer: Self-pay | Admitting: Cardiology

## 2018-11-05 ENCOUNTER — Ambulatory Visit (INDEPENDENT_AMBULATORY_CARE_PROVIDER_SITE_OTHER): Payer: Medicare Other | Admitting: Cardiology

## 2018-11-05 DIAGNOSIS — I480 Paroxysmal atrial fibrillation: Secondary | ICD-10-CM

## 2018-11-05 DIAGNOSIS — Q211 Atrial septal defect: Secondary | ICD-10-CM

## 2018-11-05 DIAGNOSIS — I6350 Cerebral infarction due to unspecified occlusion or stenosis of unspecified cerebral artery: Secondary | ICD-10-CM

## 2018-11-05 DIAGNOSIS — Q2112 Patent foramen ovale: Secondary | ICD-10-CM

## 2018-11-05 DIAGNOSIS — Z5181 Encounter for therapeutic drug level monitoring: Secondary | ICD-10-CM

## 2018-11-05 LAB — POCT INR: INR: 3.2 — AB (ref 2.0–3.0)

## 2018-11-05 NOTE — Patient Instructions (Signed)
Description   Spoke with patient and instructed her to continue taking 2.5mg  daily except 5mg  on Monday, Wednesday, and Friday. Recheck INR in 2 weeks. Call Coumadin Clinic # [phone] with any changes new medications or if scheduled for any procedures. Recheck INR in 2 weeks.

## 2018-11-12 ENCOUNTER — Other Ambulatory Visit: Payer: Self-pay | Admitting: Internal Medicine

## 2018-11-19 ENCOUNTER — Ambulatory Visit (INDEPENDENT_AMBULATORY_CARE_PROVIDER_SITE_OTHER): Payer: Medicare Other | Admitting: Cardiology

## 2018-11-19 DIAGNOSIS — Q211 Atrial septal defect: Secondary | ICD-10-CM | POA: Diagnosis not present

## 2018-11-19 DIAGNOSIS — I480 Paroxysmal atrial fibrillation: Secondary | ICD-10-CM

## 2018-11-19 DIAGNOSIS — H26493 Other secondary cataract, bilateral: Secondary | ICD-10-CM | POA: Diagnosis not present

## 2018-11-19 DIAGNOSIS — I6350 Cerebral infarction due to unspecified occlusion or stenosis of unspecified cerebral artery: Secondary | ICD-10-CM

## 2018-11-19 DIAGNOSIS — Z5181 Encounter for therapeutic drug level monitoring: Secondary | ICD-10-CM | POA: Diagnosis not present

## 2018-11-19 DIAGNOSIS — Q2112 Patent foramen ovale: Secondary | ICD-10-CM

## 2018-11-19 LAB — POCT INR: INR: 2.7 (ref 2.0–3.0)

## 2018-11-19 NOTE — Patient Instructions (Signed)
Description   Spoke with patient and instructed her to continue taking 2.5mg  daily except 5mg  on Monday, Wednesday, and Friday. Recheck INR in 2 weeks. Call Coumadin Clinic # [phone] with any changes new medications or if scheduled for any procedures. Recheck INR in 2 weeks.

## 2018-12-03 ENCOUNTER — Ambulatory Visit (INDEPENDENT_AMBULATORY_CARE_PROVIDER_SITE_OTHER): Payer: Medicare Other | Admitting: Cardiology

## 2018-12-03 DIAGNOSIS — Q2112 Patent foramen ovale: Secondary | ICD-10-CM

## 2018-12-03 DIAGNOSIS — Z5181 Encounter for therapeutic drug level monitoring: Secondary | ICD-10-CM

## 2018-12-03 DIAGNOSIS — I480 Paroxysmal atrial fibrillation: Secondary | ICD-10-CM | POA: Diagnosis not present

## 2018-12-03 DIAGNOSIS — Z7901 Long term (current) use of anticoagulants: Secondary | ICD-10-CM | POA: Diagnosis not present

## 2018-12-03 DIAGNOSIS — Q211 Atrial septal defect: Secondary | ICD-10-CM | POA: Diagnosis not present

## 2018-12-03 DIAGNOSIS — I6350 Cerebral infarction due to unspecified occlusion or stenosis of unspecified cerebral artery: Secondary | ICD-10-CM | POA: Diagnosis not present

## 2018-12-03 LAB — POCT INR: INR: 2.7 (ref 2.0–3.0)

## 2018-12-15 ENCOUNTER — Other Ambulatory Visit: Payer: Self-pay | Admitting: Internal Medicine

## 2018-12-17 ENCOUNTER — Ambulatory Visit (INDEPENDENT_AMBULATORY_CARE_PROVIDER_SITE_OTHER): Payer: Medicare Other | Admitting: Interventional Cardiology

## 2018-12-17 ENCOUNTER — Ambulatory Visit: Payer: Medicare Other | Admitting: Internal Medicine

## 2018-12-17 DIAGNOSIS — Z5181 Encounter for therapeutic drug level monitoring: Secondary | ICD-10-CM | POA: Diagnosis not present

## 2018-12-17 DIAGNOSIS — I6350 Cerebral infarction due to unspecified occlusion or stenosis of unspecified cerebral artery: Secondary | ICD-10-CM

## 2018-12-17 DIAGNOSIS — Q211 Atrial septal defect: Secondary | ICD-10-CM

## 2018-12-17 DIAGNOSIS — I480 Paroxysmal atrial fibrillation: Secondary | ICD-10-CM | POA: Diagnosis not present

## 2018-12-17 DIAGNOSIS — Q2112 Patent foramen ovale: Secondary | ICD-10-CM

## 2018-12-17 LAB — POCT INR: INR: 2.4 (ref 2.0–3.0)

## 2018-12-17 NOTE — Patient Instructions (Signed)
Description   Spoke with patient and instructed her to take 5mg  today then continue taking 2.5mg  daily except 5mg  on Monday, Wednesday, and Friday. Recheck INR in 2 weeks. Call Coumadin Clinic # [phone] with any changes new medications or if scheduled for any procedures. Recheck INR in 2 weeks.

## 2018-12-19 DIAGNOSIS — Z23 Encounter for immunization: Secondary | ICD-10-CM | POA: Diagnosis not present

## 2018-12-31 ENCOUNTER — Ambulatory Visit (INDEPENDENT_AMBULATORY_CARE_PROVIDER_SITE_OTHER): Payer: Medicare Other | Admitting: Cardiology

## 2018-12-31 DIAGNOSIS — Q211 Atrial septal defect: Secondary | ICD-10-CM

## 2018-12-31 DIAGNOSIS — I6350 Cerebral infarction due to unspecified occlusion or stenosis of unspecified cerebral artery: Secondary | ICD-10-CM | POA: Diagnosis not present

## 2018-12-31 DIAGNOSIS — Z5181 Encounter for therapeutic drug level monitoring: Secondary | ICD-10-CM

## 2018-12-31 DIAGNOSIS — Q2112 Patent foramen ovale: Secondary | ICD-10-CM

## 2018-12-31 DIAGNOSIS — I480 Paroxysmal atrial fibrillation: Secondary | ICD-10-CM

## 2018-12-31 LAB — POCT INR: INR: 2.2 (ref 2.0–3.0)

## 2018-12-31 NOTE — Patient Instructions (Signed)
Description   Spoke with patient and instructed her to take 5mg  today then start taking 5mg  daily except 2.5mg  on Tuesdays, Thursdays, Saturdays. Recheck INR in 2 weeks. Call Coumadin Clinic # [phone] with any changes new medications or if scheduled for any procedures.

## 2019-01-14 ENCOUNTER — Ambulatory Visit (INDEPENDENT_AMBULATORY_CARE_PROVIDER_SITE_OTHER): Payer: Medicare Other | Admitting: *Deleted

## 2019-01-14 DIAGNOSIS — Z5181 Encounter for therapeutic drug level monitoring: Secondary | ICD-10-CM

## 2019-01-14 DIAGNOSIS — I6350 Cerebral infarction due to unspecified occlusion or stenosis of unspecified cerebral artery: Secondary | ICD-10-CM | POA: Diagnosis not present

## 2019-01-14 DIAGNOSIS — Q2112 Patent foramen ovale: Secondary | ICD-10-CM

## 2019-01-14 DIAGNOSIS — I480 Paroxysmal atrial fibrillation: Secondary | ICD-10-CM

## 2019-01-14 DIAGNOSIS — Q211 Atrial septal defect: Secondary | ICD-10-CM | POA: Diagnosis not present

## 2019-01-14 LAB — POCT INR: INR: 2.8 (ref 2.0–3.0)

## 2019-01-14 NOTE — Patient Instructions (Signed)
Description   Spoke with patient and instructed her to continue taking 5mg  daily except 2.5mg  on Tuesdays, Thursdays, Saturdays. Recheck INR in 2 weeks. Call Coumadin Clinic # [phone] with any changes new medications or if scheduled for any procedures.

## 2019-01-21 ENCOUNTER — Encounter: Payer: Self-pay | Admitting: Internal Medicine

## 2019-01-21 ENCOUNTER — Other Ambulatory Visit: Payer: Self-pay

## 2019-01-21 ENCOUNTER — Ambulatory Visit (INDEPENDENT_AMBULATORY_CARE_PROVIDER_SITE_OTHER): Payer: Medicare Other | Admitting: Internal Medicine

## 2019-01-21 VITALS — BP 122/70 | HR 64 | Ht 68.0 in | Wt 153.0 lb

## 2019-01-21 DIAGNOSIS — I10 Essential (primary) hypertension: Secondary | ICD-10-CM

## 2019-01-21 DIAGNOSIS — I6350 Cerebral infarction due to unspecified occlusion or stenosis of unspecified cerebral artery: Secondary | ICD-10-CM

## 2019-01-21 DIAGNOSIS — I480 Paroxysmal atrial fibrillation: Secondary | ICD-10-CM | POA: Diagnosis not present

## 2019-01-21 NOTE — Patient Instructions (Signed)

## 2019-01-21 NOTE — Progress Notes (Signed)
HPI Kendra [name] returns today for followup. She is a pleasant 73 yo woman with a h/o recurrent embolic events, including a stroke. She has HTN and remote atrial flutter and fib. The patient has done well, socially distancing in the [place], living in [place]. She is walking regularly and has no anginal symptoms. Her peripheral edema resolved with discontinuation of amlodipine.  Allergies  Allergen Reactions  . Amiodarone Hcl Nausea Only and Rash     Current Outpatient Medications  Medication Sig Dispense Refill  . alendronate (FOSAMAX) 70 MG tablet TAKE 1 TABLET EVERY 7 DAYS WITH A FULL GLASS WATER ON AN EMPTY STOMACH 12 tablet 2  . aspirin 81 MG tablet Take 81 mg by mouth daily.    . cholecalciferol (VITAMIN D) 1000 units tablet Take 1,000 Units by mouth daily.    . furosemide (LASIX) 20 MG tablet TAKE 1 TABLET BY MOUTH EVERY DAY 90 tablet 1  . metoprolol succinate (TOPROL-XL) 100 MG 24 hr tablet TAKE 1 TABLET EVERY DAY WITH OR AFTER A MEAL 90 tablet 1  . rosuvastatin (CRESTOR) 5 MG tablet TAKE 1 TABLET BY MOUTH EVERY DAY 90 tablet 3  . warfarin (COUMADIN) 5 MG tablet TAKE AS DIRECTED BY COUMADIN CLINIC 70 tablet 0  . Zinc 30 MG TABS Take 1 tablet by mouth daily.     No current facility-administered medications for this visit.      Past Medical History:  Diagnosis Date  . Arthus phenomenon   . Atrial fibrillation ([place])   . Atrial flutter ([place])   . Chronic anticoagulation 12/14/2015  . 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 GERD (gastroesophageal reflux disease)   . Hemorrhoids   . Hx of blood clots   . Hypercholesterolemia   . Kidney infarction [facility])   . Stroke ([place])   . TIA (transient ischemic attack)    as per 07/30/00 note from [facility]     ROS:   All systems reviewed and negative except as noted in the HPI.   Past Surgical History:  Procedure Laterality Date  . ablasion    . APPENDECTOMY    . BREAST BIOPSY  Right   . CARDIAC ELECTROPHYSIOLOGY STUDY AND ABLATION    . CESAREAN SECTION    . fibroid tumor removal  1996  . RADIOLOGY WITH ANESTHESIA N/A 02/16/2015   Procedure: RADIOLOGY WITH ANESTHESIA;  Surgeon: Medication Radiologist, MD;  Location: [facility]URO ORS;  Service: Radiology;  Laterality: N/A;  . TEE WITHOUT CARDIOVERSION N/A 03/08/2015   Procedure: TRANSESOPHAGEAL ECHOCARDIOGRAM (TEE);  Surgeon: [name], MD;  Location: [facility];  Service: Cardiovascular;  Laterality: N/A;  . TEE WITHOUT CARDIOVERSION N/A 04/28/2015   Procedure: TRANSESOPHAGEAL ECHOCARDIOGRAM (TEE);  Surgeon: [name], MD;  Location: [facility] ENDOSCOPY;  Service: Cardiovascular;  Laterality: N/A;     Family History  Problem Relation Age of Onset  . Stroke Maternal Grandmother   . Alcohol abuse Father   . Lung cancer Father   . Other Brother        Emotional Illness  . Lung cancer Brother   . Colon cancer Neg Hx      Social History   Socioeconomic History  . Marital status: Married    Spouse name: Not on file  . Number of children: 4  . Years of education: Not on file  . Highest education level: Not on file  Occupational History  . Occupation: Retired  from [employer]  Social Needs  . Financial resource strain: Not hard at all  . Food insecurity    Worry: Never true    Inability: Never true  . Transportation needs    Medical: No    Non-medical: No  Tobacco Use  . Smoking status: Never Smoker  . Smokeless tobacco: Never Used  Substance and Sexual Activity  . Alcohol use: No    Alcohol/week: 0.0 standard drinks  . Drug use: No  . Sexual activity: Not on file  Lifestyle  . Physical activity    Days per week: 3 days    Minutes per session: 60 min  . Stress: Not at all  Relationships  . Social Herbalist on phone: Not on file    Gets together: Not on file    Attends religious service: Not on file    Active member of club or organization: Not on file    Attends meetings  of clubs or organizations: Not on file    Relationship status: Not on file  . Intimate partner violence    Fear of current or ex partner: No    Emotionally abused: No    Physically abused: No    Forced sexual activity: No  Other Topics Concern  . Not on file  Social History Narrative   ** Merged History Encounter **       Lives with husband   No caffeine drinks      BP 122/70   Pulse 64   Ht 5\' 8"  (1.727 m)   Wt 153 lb (69.4 kg)   SpO2 96%   BMI 23.26 kg/m   Physical Exam:  Well appearing NAD HEENT: Unremarkable Neck:  No JVD, no thyromegally Lymphatics:  No adenopathy Back:  No CVA tenderness Lungs:  Clear with no wheezes HEART:  Regular rate rhythm, no murmurs, no rubs, no clicks Abd:  soft, positive bowel sounds, no organomegally, no rebound, no guarding Ext:  2 plus pulses, no edema, no cyanosis, no clubbing Skin:  No rashes no nodules Neuro:  CN II through XII intact, motor grossly intact  EKG - NSR  Assess/Plan: 1. Peripheral edema - resolved off of amlodipine.  2. PAF - she is maintaining NSR. 3. HTN - her bp is better. She will continue her current meds. She is walking regularly and maintaining a low sodium diet. 4. Coags - her INR has been therapeutic on coumadin. She will continue.  Kendra [name].D.

## 2019-01-28 ENCOUNTER — Ambulatory Visit (INDEPENDENT_AMBULATORY_CARE_PROVIDER_SITE_OTHER): Payer: Medicare Other | Admitting: *Deleted

## 2019-01-28 DIAGNOSIS — Z7901 Long term (current) use of anticoagulants: Secondary | ICD-10-CM | POA: Diagnosis not present

## 2019-01-28 DIAGNOSIS — I6350 Cerebral infarction due to unspecified occlusion or stenosis of unspecified cerebral artery: Secondary | ICD-10-CM | POA: Diagnosis not present

## 2019-01-28 DIAGNOSIS — Q2112 Patent foramen ovale: Secondary | ICD-10-CM

## 2019-01-28 DIAGNOSIS — Z5181 Encounter for therapeutic drug level monitoring: Secondary | ICD-10-CM

## 2019-01-28 DIAGNOSIS — Q211 Atrial septal defect: Secondary | ICD-10-CM

## 2019-01-28 DIAGNOSIS — I480 Paroxysmal atrial fibrillation: Secondary | ICD-10-CM | POA: Diagnosis not present

## 2019-01-28 LAB — POCT INR: INR: 3.3 — AB (ref 2.0–3.0)

## 2019-01-28 NOTE — Patient Instructions (Signed)
Description   Spoke with patient and instructed her to continue taking 5mg  daily except 2.5mg  on Tuesdays, Thursdays, Saturdays. Recheck INR in 2 weeks. Call Coumadin Clinic # [phone] with any changes new medications or if scheduled for any procedures.

## 2019-02-11 ENCOUNTER — Ambulatory Visit (INDEPENDENT_AMBULATORY_CARE_PROVIDER_SITE_OTHER): Payer: Medicare Other | Admitting: Pharmacist

## 2019-02-11 DIAGNOSIS — Z5181 Encounter for therapeutic drug level monitoring: Secondary | ICD-10-CM | POA: Diagnosis not present

## 2019-02-11 DIAGNOSIS — I480 Paroxysmal atrial fibrillation: Secondary | ICD-10-CM

## 2019-02-11 DIAGNOSIS — Q211 Atrial septal defect: Secondary | ICD-10-CM

## 2019-02-11 DIAGNOSIS — I6350 Cerebral infarction due to unspecified occlusion or stenosis of unspecified cerebral artery: Secondary | ICD-10-CM

## 2019-02-11 DIAGNOSIS — Q2112 Patent foramen ovale: Secondary | ICD-10-CM

## 2019-02-11 LAB — POCT INR: INR: 3.5 — AB (ref 2.0–3.0)

## 2019-02-11 NOTE — Patient Instructions (Signed)
Description   Spoke with patient and instructed her to continue taking 5mg  daily except 2.5mg  on Tuesdays, Thursdays, Saturdays. Recheck INR in 2 weeks. Call Coumadin Clinic # [phone] with any changes new medications or if scheduled for any procedures.

## 2019-02-25 ENCOUNTER — Ambulatory Visit (INDEPENDENT_AMBULATORY_CARE_PROVIDER_SITE_OTHER): Payer: Medicare Other | Admitting: *Deleted

## 2019-02-25 DIAGNOSIS — I6350 Cerebral infarction due to unspecified occlusion or stenosis of unspecified cerebral artery: Secondary | ICD-10-CM

## 2019-02-25 DIAGNOSIS — Q2112 Patent foramen ovale: Secondary | ICD-10-CM

## 2019-02-25 DIAGNOSIS — I480 Paroxysmal atrial fibrillation: Secondary | ICD-10-CM | POA: Diagnosis not present

## 2019-02-25 DIAGNOSIS — C44619 Basal cell carcinoma of skin of left upper limb, including shoulder: Secondary | ICD-10-CM | POA: Diagnosis not present

## 2019-02-25 DIAGNOSIS — Z5181 Encounter for therapeutic drug level monitoring: Secondary | ICD-10-CM

## 2019-02-25 DIAGNOSIS — Q211 Atrial septal defect: Secondary | ICD-10-CM | POA: Diagnosis not present

## 2019-02-25 LAB — POCT INR: INR: 3.3 — AB (ref 2.0–3.0)

## 2019-02-25 NOTE — Patient Instructions (Signed)
Description   Spoke with patient and instructed her to continue taking 5mg  daily except 2.5mg  on Tuesdays, Thursdays, Saturdays. Recheck INR in 2 weeks. Call Coumadin Clinic # [phone] with any changes new medications or if scheduled for any procedures.

## 2019-03-02 ENCOUNTER — Other Ambulatory Visit: Payer: Self-pay | Admitting: Internal Medicine

## 2019-03-10 DIAGNOSIS — D0462 Carcinoma in situ of skin of left upper limb, including shoulder: Secondary | ICD-10-CM | POA: Diagnosis not present

## 2019-03-10 DIAGNOSIS — D485 Neoplasm of uncertain behavior of skin: Secondary | ICD-10-CM | POA: Diagnosis not present

## 2019-03-10 DIAGNOSIS — C44619 Basal cell carcinoma of skin of left upper limb, including shoulder: Secondary | ICD-10-CM | POA: Diagnosis not present

## 2019-03-11 ENCOUNTER — Ambulatory Visit (INDEPENDENT_AMBULATORY_CARE_PROVIDER_SITE_OTHER): Payer: Medicare Other | Admitting: *Deleted

## 2019-03-11 DIAGNOSIS — Q211 Atrial septal defect: Secondary | ICD-10-CM

## 2019-03-11 DIAGNOSIS — I6350 Cerebral infarction due to unspecified occlusion or stenosis of unspecified cerebral artery: Secondary | ICD-10-CM | POA: Diagnosis not present

## 2019-03-11 DIAGNOSIS — Z5181 Encounter for therapeutic drug level monitoring: Secondary | ICD-10-CM | POA: Diagnosis not present

## 2019-03-11 DIAGNOSIS — I480 Paroxysmal atrial fibrillation: Secondary | ICD-10-CM

## 2019-03-11 DIAGNOSIS — Q2112 Patent foramen ovale: Secondary | ICD-10-CM

## 2019-03-11 LAB — POCT INR: INR: 2.6 (ref 2.0–3.0)

## 2019-03-11 NOTE — Patient Instructions (Signed)
Description   Spoke with patient and instructed her to continue taking 5mg  daily except 2.5mg  on Tuesdays, Thursdays, Saturdays. Recheck INR in 2 weeks. Call Coumadin Clinic # [phone] with any changes new medications or if scheduled for any procedures.

## 2019-03-14 DIAGNOSIS — Z23 Encounter for immunization: Secondary | ICD-10-CM | POA: Diagnosis not present

## 2019-03-20 DIAGNOSIS — Z1231 Encounter for screening mammogram for malignant neoplasm of breast: Secondary | ICD-10-CM | POA: Diagnosis not present

## 2019-03-20 LAB — HM MAMMOGRAPHY

## 2019-03-25 ENCOUNTER — Ambulatory Visit (INDEPENDENT_AMBULATORY_CARE_PROVIDER_SITE_OTHER): Payer: Medicare Other | Admitting: *Deleted

## 2019-03-25 DIAGNOSIS — Q2112 Patent foramen ovale: Secondary | ICD-10-CM

## 2019-03-25 DIAGNOSIS — Z5181 Encounter for therapeutic drug level monitoring: Secondary | ICD-10-CM | POA: Diagnosis not present

## 2019-03-25 DIAGNOSIS — I480 Paroxysmal atrial fibrillation: Secondary | ICD-10-CM | POA: Diagnosis not present

## 2019-03-25 DIAGNOSIS — Q211 Atrial septal defect: Secondary | ICD-10-CM | POA: Diagnosis not present

## 2019-03-25 DIAGNOSIS — I6350 Cerebral infarction due to unspecified occlusion or stenosis of unspecified cerebral artery: Secondary | ICD-10-CM

## 2019-03-25 DIAGNOSIS — Z7901 Long term (current) use of anticoagulants: Secondary | ICD-10-CM | POA: Diagnosis not present

## 2019-03-25 LAB — POCT INR: INR: 3.1 — AB (ref 2.0–3.0)

## 2019-03-25 NOTE — Patient Instructions (Signed)
Description   Spoke with patient and instructed her to continue taking 5mg  daily except 2.5mg  on Tuesdays, Thursdays, and Saturdays. Recheck INR in 2 weeks. Call Coumadin Clinic # [phone] with any changes new medications or if scheduled for any procedures.

## 2019-03-27 ENCOUNTER — Ambulatory Visit: Payer: Medicare Other | Admitting: Internal Medicine

## 2019-04-01 ENCOUNTER — Encounter: Payer: Self-pay | Admitting: Internal Medicine

## 2019-04-01 ENCOUNTER — Other Ambulatory Visit: Payer: Self-pay

## 2019-04-01 ENCOUNTER — Ambulatory Visit (INDEPENDENT_AMBULATORY_CARE_PROVIDER_SITE_OTHER): Payer: Medicare Other | Admitting: Internal Medicine

## 2019-04-01 VITALS — BP 114/62 | HR 80 | Temp 95.7°F | Resp 15 | Ht 68.0 in | Wt 154.4 lb

## 2019-04-01 DIAGNOSIS — D751 Secondary polycythemia: Secondary | ICD-10-CM

## 2019-04-01 DIAGNOSIS — D582 Other hemoglobinopathies: Secondary | ICD-10-CM | POA: Diagnosis not present

## 2019-04-01 DIAGNOSIS — I480 Paroxysmal atrial fibrillation: Secondary | ICD-10-CM | POA: Diagnosis not present

## 2019-04-01 DIAGNOSIS — M205X1 Other deformities of toe(s) (acquired), right foot: Secondary | ICD-10-CM

## 2019-04-01 DIAGNOSIS — H6120 Impacted cerumen, unspecified ear: Secondary | ICD-10-CM | POA: Insufficient documentation

## 2019-04-01 DIAGNOSIS — H6121 Impacted cerumen, right ear: Secondary | ICD-10-CM | POA: Diagnosis not present

## 2019-04-01 DIAGNOSIS — Z7901 Long term (current) use of anticoagulants: Secondary | ICD-10-CM

## 2019-04-01 DIAGNOSIS — I6350 Cerebral infarction due to unspecified occlusion or stenosis of unspecified cerebral artery: Secondary | ICD-10-CM | POA: Diagnosis not present

## 2019-04-01 DIAGNOSIS — E559 Vitamin D deficiency, unspecified: Secondary | ICD-10-CM | POA: Diagnosis not present

## 2019-04-01 DIAGNOSIS — M810 Age-related osteoporosis without current pathological fracture: Secondary | ICD-10-CM

## 2019-04-01 DIAGNOSIS — I10 Essential (primary) hypertension: Secondary | ICD-10-CM

## 2019-04-01 NOTE — Assessment & Plan Note (Signed)
In the setting of osteoporosis.  Checking level today

## 2019-04-01 NOTE — Assessment & Plan Note (Signed)
Exam is c/w NSR.  Managed by cardiology with metoprolol and warfarin for embolic stroke risk mitigation/history of CVA

## 2019-04-01 NOTE — Progress Notes (Signed)
Patient ID: Kendra [name], female    DOB: 09/11/1945  Age: 74 y.o. MRN: [id number]  The patient is here for follow up and management of other chronic and acute problems.  This visit occurred during the SARS-CoV-2 public health emergency.  Safety protocols were in place, including screening questions prior to the visit, additional usage of staff PPE, and extensive cleaning of exam room while observing appropriate contact time as indicated for disinfecting solutions.     The risk factors are reflected in the social history.  The roster of all physicians providing medical care to patient - is listed in the Snapshot section of the chart.  Activities of daily living:  The patient is 100% independent in all ADLs: dressing, toileting, feeding as well as independent mobility  Home safety : The patient has smoke detectors in the home. They wear seatbelts.  There are no firearms at home. There is no violence in the home.   There is no risks for hepatitis, STDs or HIV. There is no   history of blood transfusion. They have no travel history to infectious disease endemic areas of the world.  The patient has seen their dentist in the last six month. They have seen their eye doctor in the last year. They admit to slight hearing difficulty with regard to whispered voices and some television programs.  They have deferred audiologic testing in the last year.  They do not  have excessive sun exposure. Discussed the need for sun protection: hats, long sleeves and use of sunscreen if there is significant sun exposure.   Diet: the importance of a healthy diet is discussed. They do have a healthy diet.  The benefits of regular aerobic exercise were discussed. She walks 4 times per week ,  20 minutes.   Depression screen: there are no signs or vegative symptoms of depression- irritability, change in appetite, anhedonia, sadness/tearfullness.  Cognitive assessment: the patient manages all their financial and  personal affairs and is actively engaged. They could relate day,date,year and events; recalled 2/3 objects at 3 minutes; performed clock-face test normally.  The following portions of the patient's history were reviewed and updated as appropriate: allergies, current medications, past family history, past medical history,  past surgical history, past social history  and problem list.  Visual acuity was not assessed per patient preference since she has regular follow up with her ophthalmologist. Hearing and body mass index were assessed and reviewed.   During the course of the visit the patient was educated and counseled about appropriate screening and preventive services including : fall prevention , diabetes screening, nutrition counseling, colorectal cancer screening, and recommended immunizations.    CC: The primary encounter diagnosis was Essential hypertension. Diagnoses of Chronic anticoagulation, Vitamin D deficiency, Age-related osteoporosis without current pathological fracture, Elevated hemoglobin ([place]), Secondary erythrocytosis, Impacted cerumen of right ear, Paroxysmal atrial fibrillation ([place]), and Claw toe, acquired, right were also pertinent to this visit.   Up to date on all screening   Has developed claw toes on right foot only,  ,  Still intermittent  ,  discussed podiatry referral when ready  Right ear cerumen buildup . Decreased hearing on exam. 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  She has a past surgical history that includes ablasion; Cesarean section; Breast biopsy (Right); Appendectomy; fibroid  tumor removal (1996); Cardiac electrophysiology study and ablation; Radiology with anesthesia (N/A, 02/16/2015); TEE without cardioversion (N/A, 03/08/2015); and TEE without cardioversion (N/A, 04/28/2015).   Her family history includes Alcohol abuse in her father; Lung cancer in her brother and father; Other in her brother; Stroke in her maternal grandmother.She reports that she has never smoked. She has never used smokeless tobacco. She reports that she does not drink alcohol or use drugs.  Outpatient Medications Prior to Visit  Medication Sig Dispense Refill  . alendronate (FOSAMAX) 70 MG tablet TAKE 1 TABLET EVERY 7 DAYS WITH A FULL GLASS WATER ON AN EMPTY STOMACH 12 tablet 2  . aspirin 81 MG tablet Take 81 mg by mouth daily.    . cholecalciferol (VITAMIN D) 1000 units tablet Take 1,000 Units by mouth daily.    . furosemide (LASIX) 20 MG tablet TAKE 1 TABLET BY MOUTH EVERY DAY 90 tablet 3  . metoprolol succinate (TOPROL-XL) 100 MG 24 hr tablet TAKE 1 TABLET EVERY DAY WITH OR AFTER A MEAL 90 tablet 1  . rosuvastatin (CRESTOR) 5 MG tablet TAKE 1 TABLET BY MOUTH EVERY DAY 90 tablet 3  . warfarin (COUMADIN) 5 MG tablet TAKE AS DIRECTED BY COUMADIN CLINIC 70 tablet 0  . Zinc 30 MG TABS Take 1 tablet by mouth daily.     No facility-administered medications prior to visit.    Review of Systems   Patient denies headache, fevers, malaise, unintentional weight loss, skin rash, eye pain, sinus congestion and sinus pain, sore throat, dysphagia,  hemoptysis , cough, dyspnea, wheezing, chest pain, palpitations, orthopnea, edema, abdominal pain, nausea, melena, diarrhea, constipation, flank pain, dysuria, hematuria, urinary  Frequency, nocturia, numbness, tingling, seizures,  Focal weakness, Loss of consciousness,  Tremor, insomnia, depression, anxiety, and suicidal ideation.      Objective:  BP 114/62 (BP Location: Left Arm, Patient Position: Sitting, Cuff Size: Normal)   Pulse 80   Temp (!) 95.7 F  (35.4 C) (Temporal)   Resp 15   Ht 5\' 8"  (1.727 m)   Wt 154 lb 6.4 oz (70 kg)   SpO2 94%   BMI 23.48 kg/m   Physical Exam   General appearance: alert, cooperative and appears stated age Ears: normal TM's and external ear canals both ears Throat: lips, mucosa, and tongue normal; teeth and gums normal Neck: no adenopathy, no carotid bruit, supple, symmetrical, trachea midline and thyroid not enlarged, symmetric, no tenderness/mass/nodules Back: symmetric, no curvature. ROM normal. No CVA tenderness. Lungs: clear to auscultation bilaterally Heart: regular rate and rhythm, S1, S2 normal, no murmur, click, rub or gallop Abdomen: soft, non-tender; bowel sounds normal; no masses,  no organomegaly Pulses: 2+ and symmetric Skin: Skin color, texture, turgor normal. No rashes or lesions Lymph nodes: Cervical, supraclavicular, and axillary nodes normal.    Assessment & Plan:   Problem List Items Addressed This Visit      Unprioritized   Cerumen impaction    Recommend use of OTC Debrox      Chronic anticoagulation   Claw toe, acquired, right    Intermittent, per patient with callous formation on distal phalanges.  Podiatry referral discussed ; she is contemplative       Essential hypertension - Primary   Relevant Orders   Comprehensive metabolic panel   Lipid panel   Osteoporosis    Managed with alendronate . 2 yr follow up DEXA  Noted  improved T scores.  Reinforced need for increased calcium intake with goal of 1800 mg daily through diet and supplements (1 supplement daily MAX), and vitamin D supplementation.  Continue weight bearing exercise.  Discussed length of therapy as 5 years,  Will  Repeat DEXA at year 5 and suspend alendronate at that time.       Relevant Orders   TSH   Paroxysmal atrial fibrillation (HCC)    Exam is c/w NSR.  Managed by cardiology with metoprolol and warfarin for embolic stroke risk mitigation/history of CVA       Secondary erythrocytosis    She  was evaluated repeatedly by oncology until Dec 2019 when she requested no further follow up  Rechecking hgb today       Vitamin D deficiency    In the setting of osteoporosis.  Checking level today      Relevant Orders   VITAMIN D 25 Hydroxy (Vit-D Deficiency, Fractures)      I am having [place] maintain her aspirin, cholecalciferol, rosuvastatin, warfarin, metoprolol succinate, alendronate, Zinc, and furosemide.  No orders of the defined types were placed in this encounter.  A total of 45 minutes was spent with patient more than half of which was spent in counseling patient on the above mentioned issues , reviewing and explaining recent labs and imaging studies done, and  coordination of care.  There are no discontinued medications.  Follow-up: No follow-ups on file.   Crecencio Mc, MD

## 2019-04-01 NOTE — Assessment & Plan Note (Signed)
Managed with alendronate . 2 yr follow up DEXA  Noted improved T scores.  Reinforced need for increased calcium intake with goal of 1800 mg daily through diet and supplements (1 supplement daily MAX), and vitamin D supplementation.  Continue weight bearing exercise.  Discussed length of therapy as 5 years,  Will  Repeat DEXA at year 5 and suspend alendronate at that time.

## 2019-04-01 NOTE — Assessment & Plan Note (Signed)
Intermittent, per patient with callous formation on distal phalanges.  Podiatry referral discussed ; she is contemplative

## 2019-04-01 NOTE — Assessment & Plan Note (Signed)
Recommend use of OTC Debrox

## 2019-04-01 NOTE — Assessment & Plan Note (Addendum)
She was evaluated repeatedly by oncology until Dec 2019 when she requested no further follow up  Rechecking hgb today

## 2019-04-01 NOTE — Patient Instructions (Addendum)
I recommmend Samara Deist at [facility] podiatry    Your calcium needs are 1800 mg daily .  I recommend getting the majority of your calcium and Vitamin D  through diet rather than supplements given the recent association of calcium supplements with increased coronary artery calcium scores  Try the almond and cashew milks that most grocery stores  now carry  in the dairy  Section>   They are lactose free:  [employer] brand Almond Light,  Original formula.  Delicious,  Low carb,  Low cal,  Cholesterol free   Try the DeBrox liquid for the cerumen buildup in your right ear   Earwax Buildup, Adult The ears produce a substance called earwax that helps keep bacteria out of the ear and protects the skin in the ear canal. Occasionally, earwax can build up in the ear and cause discomfort or hearing loss. What increases the risk? This condition is more likely to develop in people who:  Are female.  Are elderly.  Naturally produce more earwax.  Clean their ears often with cotton swabs.  Use earplugs often.  Use in-ear headphones often.  Wear hearing aids.  Have narrow ear canals.  Have earwax that is overly thick or sticky.  Have eczema.  Are dehydrated.  Have excess hair in the ear canal. What are the signs or symptoms? Symptoms of this condition include:  Reduced or muffled hearing.  A feeling of fullness in the ear or feeling that the ear is plugged.  Fluid coming from the ear.  Ear pain.  Ear itch.  Ringing in the ear.  Coughing.  An obvious piece of earwax that can be seen inside the ear canal. How is this diagnosed? This condition may be diagnosed based on:  Your symptoms.  Your medical history.  An ear exam. During the exam, your health care provider will look into your ear with an instrument called an otoscope. You may have tests, including a hearing test. How is this treated? This condition may be treated by:  Using ear drops to soften the  earwax.  Having the earwax removed by a health care provider. The health care provider may: ? Flush the ear with water. ? Use an instrument that has a loop on the end (curette). ? Use a suction device.  Surgery to remove the wax buildup. This may be done in severe cases. Follow these instructions at home:   Take over-the-counter and prescription medicines only as told by your health care provider.  Do not put any objects, including cotton swabs, into your ear. You can clean the opening of your ear canal with a washcloth or facial tissue.  Follow instructions from your health care provider about cleaning your ears. Do not over-clean your ears.  Drink enough fluid to keep your urine clear or pale yellow. This will help to thin the earwax.  Keep all follow-up visits as told by your health care provider. If earwax builds up in your ears often or if you use hearing aids, consider seeing your health care provider for routine, preventive ear cleanings. Ask your health care provider how often you should schedule your cleanings.  If you have hearing aids, clean them according to instructions from the manufacturer and your health care provider. Contact a health care provider if:  You have ear pain.  You develop a fever.  You have blood, pus, or other fluid coming from your ear.  You have hearing loss.  You have ringing in your ears that  does not go away.  Your symptoms do not improve with treatment.  You feel like the room is spinning (vertigo). Summary  Earwax can build up in the ear and cause discomfort or hearing loss.  The most common symptoms of this condition include reduced or muffled hearing and a feeling of fullness in the ear or feeling that the ear is plugged.  This condition may be diagnosed based on your symptoms, your medical history, and an ear exam.  This condition may be treated by using ear drops to soften the earwax or by having the earwax removed by a health  care provider.  Do not put any objects, including cotton swabs, into your ear. You can clean the opening of your ear canal with a washcloth or facial tissue. This information is not intended to replace advice given to you by your health care provider. Make sure you discuss any questions you have with your health care provider. Document Revised: 01/26/2017 Document Reviewed: 04/26/2016 Elsevier Patient Education  2020 [employer].

## 2019-04-02 ENCOUNTER — Other Ambulatory Visit: Payer: Self-pay | Admitting: Internal Medicine

## 2019-04-02 LAB — COMPREHENSIVE METABOLIC PANEL
ALT: 17 U/L (ref 0–35)
AST: 17 U/L (ref 0–37)
Albumin: 4.3 g/dL (ref 3.5–5.2)
Alkaline Phosphatase: 77 U/L (ref 39–117)
BUN: 15 mg/dL (ref 6–23)
CO2: 28 mEq/L (ref 19–32)
Calcium: 9.4 mg/dL (ref 8.4–10.5)
Chloride: 101 mEq/L (ref 96–112)
Creatinine, Ser: 0.85 mg/dL (ref 0.40–1.20)
GFR: 65.21 mL/min (ref >60.00)
Glucose, Bld: 83 mg/dL (ref 70–99)
Potassium: 4.5 mEq/L (ref 3.5–5.1)
Sodium: 136 mEq/L (ref 135–145)
Total Bilirubin: 0.7 mg/dL (ref 0.2–1.2)
Total Protein: 7.1 g/dL (ref 6.0–8.3)

## 2019-04-02 LAB — CBC WITH DIFFERENTIAL/PLATELET
Basophils Absolute: 0 10*3/uL (ref 0.0–0.1)
Basophils Relative: 0.6 % (ref 0.0–3.0)
Eosinophils Absolute: 0.1 10*3/uL (ref 0.0–0.7)
Eosinophils Relative: 1.7 % (ref 0.0–5.0)
HCT: 43.8 % (ref 36.0–46.0)
Hemoglobin: 15.3 g/dL — ABNORMAL HIGH (ref 12.0–15.0)
Lymphocytes Relative: 31.5 % (ref 12.0–46.0)
Lymphs Abs: 2 10*3/uL (ref 0.7–4.0)
MCHC: 34.9 g/dL (ref 30.0–36.0)
MCV: 91.3 fl (ref 78.0–100.0)
Monocytes Absolute: 0.5 10*3/uL (ref 0.1–1.0)
Monocytes Relative: 7.7 % (ref 3.0–12.0)
Neutro Abs: 3.7 10*3/uL (ref 1.4–7.7)
Neutrophils Relative %: 58.5 % (ref 43.0–77.0)
Platelets: 223 10*3/uL (ref 150.0–400.0)
RBC: 4.8 Mil/uL (ref 3.87–5.11)
RDW: 12.4 % (ref 11.5–15.5)
WBC: 6.4 10*3/uL (ref 4.0–10.5)

## 2019-04-02 LAB — LIPID PANEL
Cholesterol: 174 mg/dL (ref 0–200)
HDL: 55.7 mg/dL (ref >39.00)
LDL Cholesterol: 94 mg/dL (ref 0–99)
NonHDL: 117.88
Total CHOL/HDL Ratio: 3
Triglycerides: 118 mg/dL (ref 0.0–149.0)
VLDL: 23.6 mg/dL (ref 0.0–40.0)

## 2019-04-02 LAB — TSH: TSH: 1.38 u[IU]/mL (ref 0.35–4.50)

## 2019-04-02 LAB — VITAMIN D 25 HYDROXY (VIT D DEFICIENCY, FRACTURES): VITD: 29.03 ng/mL — ABNORMAL LOW (ref 30.00–100.00)

## 2019-04-08 ENCOUNTER — Ambulatory Visit (INDEPENDENT_AMBULATORY_CARE_PROVIDER_SITE_OTHER): Payer: Medicare Other | Admitting: *Deleted

## 2019-04-08 DIAGNOSIS — Z5181 Encounter for therapeutic drug level monitoring: Secondary | ICD-10-CM

## 2019-04-08 DIAGNOSIS — Q2112 Patent foramen ovale: Secondary | ICD-10-CM

## 2019-04-08 DIAGNOSIS — Q211 Atrial septal defect: Secondary | ICD-10-CM

## 2019-04-08 DIAGNOSIS — D0462 Carcinoma in situ of skin of left upper limb, including shoulder: Secondary | ICD-10-CM | POA: Diagnosis not present

## 2019-04-08 DIAGNOSIS — I480 Paroxysmal atrial fibrillation: Secondary | ICD-10-CM

## 2019-04-08 DIAGNOSIS — I6350 Cerebral infarction due to unspecified occlusion or stenosis of unspecified cerebral artery: Secondary | ICD-10-CM | POA: Diagnosis not present

## 2019-04-08 LAB — POCT INR: INR: 2.7 (ref 2.0–3.0)

## 2019-04-08 NOTE — Patient Instructions (Signed)
Description   Spoke with patient and instructed her to continue taking 5mg  daily except 2.5mg  on Tuesdays, Thursdays, and Saturdays. Recheck INR in 2 weeks. Call [facility] Clinic # [phone] with any changes new medications or if scheduled for any procedures.

## 2019-04-11 DIAGNOSIS — Z23 Encounter for immunization: Secondary | ICD-10-CM | POA: Diagnosis not present

## 2019-04-22 ENCOUNTER — Ambulatory Visit (INDEPENDENT_AMBULATORY_CARE_PROVIDER_SITE_OTHER): Payer: Medicare Other | Admitting: *Deleted

## 2019-04-22 DIAGNOSIS — Z5181 Encounter for therapeutic drug level monitoring: Secondary | ICD-10-CM | POA: Diagnosis not present

## 2019-04-22 DIAGNOSIS — Q211 Atrial septal defect: Secondary | ICD-10-CM | POA: Diagnosis not present

## 2019-04-22 DIAGNOSIS — M2011 Hallux valgus (acquired), right foot: Secondary | ICD-10-CM | POA: Diagnosis not present

## 2019-04-22 DIAGNOSIS — I480 Paroxysmal atrial fibrillation: Secondary | ICD-10-CM | POA: Diagnosis not present

## 2019-04-22 DIAGNOSIS — I63412 Cerebral infarction due to embolism of left middle cerebral artery: Secondary | ICD-10-CM | POA: Diagnosis not present

## 2019-04-22 DIAGNOSIS — M21611 Bunion of right foot: Secondary | ICD-10-CM | POA: Diagnosis not present

## 2019-04-22 DIAGNOSIS — Q2112 Patent foramen ovale: Secondary | ICD-10-CM

## 2019-04-22 DIAGNOSIS — I6350 Cerebral infarction due to unspecified occlusion or stenosis of unspecified cerebral artery: Secondary | ICD-10-CM | POA: Diagnosis not present

## 2019-04-22 DIAGNOSIS — I4892 Unspecified atrial flutter: Secondary | ICD-10-CM | POA: Diagnosis not present

## 2019-04-22 LAB — POCT INR: INR: 2.6 (ref 2.0–3.0)

## 2019-05-06 ENCOUNTER — Ambulatory Visit (INDEPENDENT_AMBULATORY_CARE_PROVIDER_SITE_OTHER): Payer: Medicare Other | Admitting: *Deleted

## 2019-05-06 DIAGNOSIS — I480 Paroxysmal atrial fibrillation: Secondary | ICD-10-CM

## 2019-05-06 DIAGNOSIS — Z5181 Encounter for therapeutic drug level monitoring: Secondary | ICD-10-CM | POA: Diagnosis not present

## 2019-05-06 DIAGNOSIS — Q211 Atrial septal defect: Secondary | ICD-10-CM

## 2019-05-06 DIAGNOSIS — I6350 Cerebral infarction due to unspecified occlusion or stenosis of unspecified cerebral artery: Secondary | ICD-10-CM | POA: Diagnosis not present

## 2019-05-06 DIAGNOSIS — Z7901 Long term (current) use of anticoagulants: Secondary | ICD-10-CM | POA: Diagnosis not present

## 2019-05-06 DIAGNOSIS — Q2112 Patent foramen ovale: Secondary | ICD-10-CM

## 2019-05-06 LAB — POCT INR: INR: 2.9 (ref 2.0–3.0)

## 2019-05-20 ENCOUNTER — Ambulatory Visit (INDEPENDENT_AMBULATORY_CARE_PROVIDER_SITE_OTHER): Payer: Medicare Other | Admitting: *Deleted

## 2019-05-20 DIAGNOSIS — I480 Paroxysmal atrial fibrillation: Secondary | ICD-10-CM

## 2019-05-20 DIAGNOSIS — Q2112 Patent foramen ovale: Secondary | ICD-10-CM

## 2019-05-20 DIAGNOSIS — Q211 Atrial septal defect: Secondary | ICD-10-CM | POA: Diagnosis not present

## 2019-05-20 DIAGNOSIS — I6350 Cerebral infarction due to unspecified occlusion or stenosis of unspecified cerebral artery: Secondary | ICD-10-CM | POA: Diagnosis not present

## 2019-05-20 DIAGNOSIS — Z5181 Encounter for therapeutic drug level monitoring: Secondary | ICD-10-CM | POA: Diagnosis not present

## 2019-05-20 LAB — POCT INR: INR: 2.5 (ref 2.0–3.0)

## 2019-05-20 NOTE — Patient Instructions (Signed)
Description   Spoke with patient and instructed her to continue taking 5mg  daily except 2.5mg  on Tuesdays, Thursdays, and Saturdays. Recheck INR in 2 weeks. Call [facility] Clinic # [phone] with any changes new medications or if scheduled for any procedures.

## 2019-05-24 ENCOUNTER — Other Ambulatory Visit: Payer: Self-pay | Admitting: Internal Medicine

## 2019-06-03 ENCOUNTER — Ambulatory Visit (INDEPENDENT_AMBULATORY_CARE_PROVIDER_SITE_OTHER): Payer: Medicare Other

## 2019-06-03 DIAGNOSIS — I6350 Cerebral infarction due to unspecified occlusion or stenosis of unspecified cerebral artery: Secondary | ICD-10-CM | POA: Diagnosis not present

## 2019-06-03 DIAGNOSIS — Q2112 Patent foramen ovale: Secondary | ICD-10-CM

## 2019-06-03 DIAGNOSIS — Z5181 Encounter for therapeutic drug level monitoring: Secondary | ICD-10-CM | POA: Diagnosis not present

## 2019-06-03 DIAGNOSIS — Q211 Atrial septal defect: Secondary | ICD-10-CM

## 2019-06-03 DIAGNOSIS — I480 Paroxysmal atrial fibrillation: Secondary | ICD-10-CM | POA: Diagnosis not present

## 2019-06-03 LAB — POCT INR: INR: 3.5 — AB (ref 2.0–3.0)

## 2019-06-03 NOTE — Patient Instructions (Signed)
Description   Spoke with patient and instructed her to continue taking 5mg  daily except 2.5mg  on Tuesdays, Thursdays, and Saturdays. Recheck INR in 2 weeks. Call [facility]madin Clinic # [phone] with any changes new medications or if scheduled for any procedures.

## 2019-06-17 ENCOUNTER — Ambulatory Visit (INDEPENDENT_AMBULATORY_CARE_PROVIDER_SITE_OTHER): Payer: Medicare Other | Admitting: *Deleted

## 2019-06-17 DIAGNOSIS — Q211 Atrial septal defect: Secondary | ICD-10-CM | POA: Diagnosis not present

## 2019-06-17 DIAGNOSIS — I480 Paroxysmal atrial fibrillation: Secondary | ICD-10-CM | POA: Diagnosis not present

## 2019-06-17 DIAGNOSIS — Z5181 Encounter for therapeutic drug level monitoring: Secondary | ICD-10-CM

## 2019-06-17 DIAGNOSIS — I6350 Cerebral infarction due to unspecified occlusion or stenosis of unspecified cerebral artery: Secondary | ICD-10-CM

## 2019-06-17 DIAGNOSIS — Z7901 Long term (current) use of anticoagulants: Secondary | ICD-10-CM | POA: Diagnosis not present

## 2019-06-17 DIAGNOSIS — Q2112 Patent foramen ovale: Secondary | ICD-10-CM

## 2019-06-17 LAB — POCT INR: INR: 2.5 (ref 2.0–3.0)

## 2019-07-01 ENCOUNTER — Ambulatory Visit (INDEPENDENT_AMBULATORY_CARE_PROVIDER_SITE_OTHER): Payer: Medicare Other | Admitting: *Deleted

## 2019-07-01 DIAGNOSIS — I6350 Cerebral infarction due to unspecified occlusion or stenosis of unspecified cerebral artery: Secondary | ICD-10-CM

## 2019-07-01 DIAGNOSIS — I480 Paroxysmal atrial fibrillation: Secondary | ICD-10-CM | POA: Diagnosis not present

## 2019-07-01 DIAGNOSIS — Z5181 Encounter for therapeutic drug level monitoring: Secondary | ICD-10-CM

## 2019-07-01 DIAGNOSIS — Q2112 Patent foramen ovale: Secondary | ICD-10-CM

## 2019-07-01 DIAGNOSIS — Q211 Atrial septal defect: Secondary | ICD-10-CM

## 2019-07-01 LAB — POCT INR: INR: 2.5 (ref 2.0–3.0)

## 2019-07-01 NOTE — Patient Instructions (Signed)
Description   Spoke with patient and instructed her to continue taking 5mg  daily except 2.5mg  on Tuesdays, Thursdays, and Saturdays. Recheck INR in 2 weeks. Call Coumadin Clinic # [phone] with any changes new medications or if scheduled for any procedures.

## 2019-07-03 ENCOUNTER — Other Ambulatory Visit: Payer: Self-pay | Admitting: Internal Medicine

## 2019-07-15 ENCOUNTER — Other Ambulatory Visit: Payer: Self-pay | Admitting: Internal Medicine

## 2019-07-15 ENCOUNTER — Ambulatory Visit (INDEPENDENT_AMBULATORY_CARE_PROVIDER_SITE_OTHER): Payer: Medicare Other | Admitting: *Deleted

## 2019-07-15 DIAGNOSIS — I480 Paroxysmal atrial fibrillation: Secondary | ICD-10-CM | POA: Diagnosis not present

## 2019-07-15 DIAGNOSIS — Z5181 Encounter for therapeutic drug level monitoring: Secondary | ICD-10-CM | POA: Diagnosis not present

## 2019-07-15 DIAGNOSIS — I6350 Cerebral infarction due to unspecified occlusion or stenosis of unspecified cerebral artery: Secondary | ICD-10-CM | POA: Diagnosis not present

## 2019-07-15 LAB — POCT INR: INR: 3.2 — AB (ref 2.0–3.0)

## 2019-07-15 MED ORDER — WARFARIN SODIUM 5 MG PO TABS: ORAL_TABLET | ORAL | 0 refills | Status: DC

## 2019-07-15 NOTE — Patient Instructions (Signed)
Description   Spoke with patient and instructed her to continue taking 5mg  daily except 2.5mg  on Tuesdays, Thursdays, and Saturdays. Recheck INR in 2 weeks. Call Coumadin Clinic # [phone] with any changes new medications or if scheduled for any procedures.

## 2019-07-15 NOTE — Addendum Note (Signed)
Addended by: Johny Shock B on: 07/15/2019 01:12 PM   Modules accepted: Orders

## 2019-07-29 ENCOUNTER — Ambulatory Visit (INDEPENDENT_AMBULATORY_CARE_PROVIDER_SITE_OTHER): Payer: Medicare Other | Admitting: Pharmacist

## 2019-07-29 DIAGNOSIS — Q2112 Patent foramen ovale: Secondary | ICD-10-CM

## 2019-07-29 DIAGNOSIS — I6350 Cerebral infarction due to unspecified occlusion or stenosis of unspecified cerebral artery: Secondary | ICD-10-CM | POA: Diagnosis not present

## 2019-07-29 DIAGNOSIS — Q211 Atrial septal defect: Secondary | ICD-10-CM

## 2019-07-29 DIAGNOSIS — I480 Paroxysmal atrial fibrillation: Secondary | ICD-10-CM | POA: Diagnosis not present

## 2019-07-29 DIAGNOSIS — Z5181 Encounter for therapeutic drug level monitoring: Secondary | ICD-10-CM | POA: Diagnosis not present

## 2019-07-29 LAB — POCT INR: INR: 3.2 — AB (ref 2.0–3.0)

## 2019-07-29 NOTE — Patient Instructions (Signed)
Description   Spoke with patient and instructed her to continue taking 5mg  daily except 2.5mg  on Tuesdays, Thursdays, and Saturdays. Recheck INR in 2 weeks. Call [facility]madin Clinic # [phone] with any changes new medications or if scheduled for any procedures.

## 2019-08-12 ENCOUNTER — Ambulatory Visit (INDEPENDENT_AMBULATORY_CARE_PROVIDER_SITE_OTHER): Payer: Medicare Other | Admitting: *Deleted

## 2019-08-12 DIAGNOSIS — I480 Paroxysmal atrial fibrillation: Secondary | ICD-10-CM

## 2019-08-12 DIAGNOSIS — Z5181 Encounter for therapeutic drug level monitoring: Secondary | ICD-10-CM

## 2019-08-12 DIAGNOSIS — I6350 Cerebral infarction due to unspecified occlusion or stenosis of unspecified cerebral artery: Secondary | ICD-10-CM | POA: Diagnosis not present

## 2019-08-12 DIAGNOSIS — Q2112 Patent foramen ovale: Secondary | ICD-10-CM

## 2019-08-12 DIAGNOSIS — Q211 Atrial septal defect: Secondary | ICD-10-CM | POA: Diagnosis not present

## 2019-08-12 LAB — POCT INR: INR: 2.6 (ref 2.0–3.0)

## 2019-08-12 NOTE — Patient Instructions (Signed)
Description   Spoke with patient and instructed her to continue taking 5mg  daily except 2.5mg  on Tuesdays, Thursdays, and Saturdays. Recheck INR in 2 weeks (self-tester). Call [facility]mad[facility] Clinic # [phone] with any changes new medications or if scheduled for any procedures.

## 2019-08-26 ENCOUNTER — Ambulatory Visit (INDEPENDENT_AMBULATORY_CARE_PROVIDER_SITE_OTHER): Payer: Medicare Other | Admitting: *Deleted

## 2019-08-26 DIAGNOSIS — I480 Paroxysmal atrial fibrillation: Secondary | ICD-10-CM

## 2019-08-26 DIAGNOSIS — Q211 Atrial septal defect: Secondary | ICD-10-CM | POA: Diagnosis not present

## 2019-08-26 DIAGNOSIS — Q2112 Patent foramen ovale: Secondary | ICD-10-CM

## 2019-08-26 DIAGNOSIS — Z5181 Encounter for therapeutic drug level monitoring: Secondary | ICD-10-CM

## 2019-08-26 DIAGNOSIS — I6350 Cerebral infarction due to unspecified occlusion or stenosis of unspecified cerebral artery: Secondary | ICD-10-CM

## 2019-08-26 LAB — POCT INR: INR: 3 (ref 2.0–3.0)

## 2019-08-26 NOTE — Patient Instructions (Signed)
Description   Spoke with patient and instructed her to continue taking 5mg  daily except 2.5mg  on Tuesdays, Thursdays, and Saturdays. Recheck INR in 2 weeks (self-tester). Call [facility] Clinic # [phone] with any changes new medications or if scheduled for any procedures.

## 2019-09-09 ENCOUNTER — Ambulatory Visit (INDEPENDENT_AMBULATORY_CARE_PROVIDER_SITE_OTHER): Payer: Medicare Other | Admitting: *Deleted

## 2019-09-09 DIAGNOSIS — I6350 Cerebral infarction due to unspecified occlusion or stenosis of unspecified cerebral artery: Secondary | ICD-10-CM

## 2019-09-09 DIAGNOSIS — Q2112 Patent foramen ovale: Secondary | ICD-10-CM

## 2019-09-09 DIAGNOSIS — Q211 Atrial septal defect: Secondary | ICD-10-CM | POA: Diagnosis not present

## 2019-09-09 DIAGNOSIS — Z5181 Encounter for therapeutic drug level monitoring: Secondary | ICD-10-CM

## 2019-09-09 DIAGNOSIS — I480 Paroxysmal atrial fibrillation: Secondary | ICD-10-CM

## 2019-09-09 LAB — POCT INR: INR: 2.5 (ref 2.0–3.0)

## 2019-09-09 NOTE — Patient Instructions (Signed)
Description   Spoke with patient and instructed her to continue taking 5mg  daily except 2.5mg  on Tuesdays, Thursdays, and Saturdays. Recheck INR in 2 weeks (self-tester). Call [facility] Clinic # [phone] with any changes new medications or if scheduled for any procedures.

## 2019-09-12 ENCOUNTER — Other Ambulatory Visit: Payer: Self-pay | Admitting: Internal Medicine

## 2019-09-23 ENCOUNTER — Ambulatory Visit (INDEPENDENT_AMBULATORY_CARE_PROVIDER_SITE_OTHER): Payer: Medicare Other | Admitting: *Deleted

## 2019-09-23 DIAGNOSIS — Q211 Atrial septal defect: Secondary | ICD-10-CM | POA: Diagnosis not present

## 2019-09-23 DIAGNOSIS — I480 Paroxysmal atrial fibrillation: Secondary | ICD-10-CM | POA: Diagnosis not present

## 2019-09-23 DIAGNOSIS — I6350 Cerebral infarction due to unspecified occlusion or stenosis of unspecified cerebral artery: Secondary | ICD-10-CM | POA: Diagnosis not present

## 2019-09-23 DIAGNOSIS — Z5181 Encounter for therapeutic drug level monitoring: Secondary | ICD-10-CM | POA: Diagnosis not present

## 2019-09-23 DIAGNOSIS — Q2112 Patent foramen ovale: Secondary | ICD-10-CM

## 2019-09-23 DIAGNOSIS — Z7901 Long term (current) use of anticoagulants: Secondary | ICD-10-CM | POA: Diagnosis not present

## 2019-09-23 LAB — POCT INR: INR: 3.3 — AB (ref 2.0–3.0)

## 2019-09-23 NOTE — Patient Instructions (Signed)
Description   Spoke with patient and instructed her to continue taking Warfarin 5mg daily except 2.5mg on Tuesdays, Thursdays, and Saturdays. Recheck INR in 2 weeks (self-tester). Call Coumadin Clinic # [phone] with any changes new medications or if scheduled for any procedures.      

## 2019-09-29 ENCOUNTER — Telehealth: Payer: Self-pay | Admitting: Internal Medicine

## 2019-09-29 DIAGNOSIS — I10 Essential (primary) hypertension: Secondary | ICD-10-CM

## 2019-09-29 NOTE — Telephone Encounter (Signed)
Patient returning call.

## 2019-09-29 NOTE — Telephone Encounter (Signed)
Call back received from Pt.  She states when she checked her blood pressure later as advised it was 144/84.  Pt would like nurse to discuss adding another hypertensive medication with Dr. Lovena Le.

## 2019-09-29 NOTE — Telephone Encounter (Signed)
Patient states her BP has been in the 150's, and is requesting to see if a medication can be called in for her, to the [facility] in [place]. She states this is high for her.    Pt c/o BP issue: STAT if pt c/o blurred vision, one-sided weakness or slurred speech  1. What are your last 5 BP readings? 160/90 this morning.   2. Are you having any other symptoms (ex. Dizziness, headache, blurred vision, passed out)? No, she states she has really been fine.   3. What is your BP issue?  Too high - usually 120-130.

## 2019-09-29 NOTE — Telephone Encounter (Signed)
Returned call to pt. NO answer.  Left detailed message.  Advised to recheck her blood pressure around 11 am to see if it is better.  Advised will not be able to discuss addl blood pressure medications with [facility] until Wednesday.  Advised if BP still high at 11 am she could call PCP.  Advised to avoid salt.  Advised to call back if addl help needed.

## 2019-10-01 ENCOUNTER — Ambulatory Visit (INDEPENDENT_AMBULATORY_CARE_PROVIDER_SITE_OTHER): Payer: Medicare Other

## 2019-10-01 VITALS — Ht 68.0 in | Wt 154.0 lb

## 2019-10-01 DIAGNOSIS — Z0000 Encounter for general adult medical examination without abnormal findings: Secondary | ICD-10-CM

## 2019-10-01 NOTE — Progress Notes (Addendum)
Subjective:   Kendra [name] is a 74 y.o. female who presents for Medicare Annual (Subsequent) preventive examination.  Review of Systems    No ROS.  Medicare Wellness Virtual Visit.   Cardiac Risk Factors include: advanced age (>69men, >92 women)     Objective:    Today's Vitals   10/01/19 1002  Weight: 154 lb (69.9 kg)  Height: 5\' 8"  (1.727 m)   Body mass index is 23.42 kg/m.  Advanced Directives 10/01/2019 09/30/2018 04/28/2015 02/16/2015 02/13/2015  Does Patient Have a Medical Advance Directive? Yes Yes No No No  Type of Paramedic of [place];Living will [place];Living will - - -  Does patient want to make changes to medical advance directive? No - Patient declined No - Patient declined - - -  Copy of [place] in Chart? No - copy requested No - copy requested - - -  Would patient like information on creating a medical advance directive? - - - No - patient declined information No - patient declined information  Some encounter information is confidential and restricted. Go to Review Flowsheets activity to see all data.    Current Medications (verified) Outpatient Encounter Medications as of 10/01/2019  Medication Sig   alendronate (FOSAMAX) 70 MG tablet TAKE 1 TABLET EVERY 7 DAYS WITH A FULL GLASS WATER ON AN EMPTY STOMACH   aspirin 81 MG tablet Take 81 mg by mouth daily.   cholecalciferol (VITAMIN D) 1000 units tablet Take 1,000 Units by mouth daily.   furosemide (LASIX) 20 MG tablet TAKE 1 TABLET BY MOUTH EVERY DAY   Magnesium 100 MG TABS Take by mouth daily.   metoprolol succinate (TOPROL-XL) 100 MG 24 hr tablet TAKE 1 TABLET EVERY DAY WITH OR AFTER A MEAL   rosuvastatin (CRESTOR) 5 MG tablet TAKE 1 TABLET BY MOUTH EVERY DAY   warfarin (COUMADIN) 5 MG tablet Take 1/2 to 1 tablet by mouth daily as directed by the coumadin clinic   Zinc 30 MG TABS Take 1 tablet by mouth daily.   No facility-administered  encounter medications on file as of 10/01/2019.    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[place])    GERD (gastroesophageal reflux disease)    Hemorrhoids    Hx of blood clots    Hypercholesterolemia    Kidney infarction [facility])    Stroke (Kendra [name])    TIA (transient ischemic attack)    as per 07/30/00 note from [place]    Past Surgical History:  Procedure Laterality Date   ablasion     APPENDECTOMY     BREAST BIOPSY Right    CARDIAC ELECTROPHYSIOLOGY STUDY AND ABLATION     CESAREAN SECTION     fibroid tumor removal  1996   RADIOLOGY WITH ANESTHESIA N/A 02/16/2015   Procedure: RADIOLOGY WITH ANESTHESIA;  Surgeon: Medication Radiologist, MD;  Location: [place] [facility];  Service: Radiology;  Laterality: N/A;   TEE WITHOUT CARDIOVERSION N/A 03/08/2015   Procedure: TRANSESOPHAGEAL ECHOCARDIOGRAM (TEE);  Surgeon: Kendra Spark, MD;  Location: [place];  Service: Cardiovascular;  Laterality: N/A;   TEE WITHOUT CARDIOVERSION N/A 04/28/2015   Procedure: TRANSESOPHAGEAL ECHOCARDIOGRAM (TEE);  Surgeon: Kendra Spark, MD;  Location: [place];  Service: Cardiovascular;  Laterality: N/A;   Family History  Problem Relation Age of Onset   Stroke Maternal Grandmother    Alcohol abuse Father    Lung cancer Father    Other Brother        Emotional Illness   Lung cancer Brother    Colon cancer Neg Hx    Social History   Socioeconomic History   Marital status: Married    Spouse name: Not on file   Number of children: 4   Years of education: Not on file   Highest education level: Not on file  Occupational History   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  Tobacco Use   Smoking status: Never Smoker   Smokeless tobacco: Never Used  Vaping Use   Vaping Use:  Never used  Substance and Sexual Activity   Alcohol use: No    Alcohol/week: 0.0 standard drinks   Drug use: No   Sexual activity: Not on file  Other Topics Concern   Not on file  Social History Narrative   ** Merged History Encounter **       Lives with husband   No caffeine drinks    Social Determinants of Radio broadcast assistant Strain: Low Risk    Difficulty of Paying Living Expenses: Not hard at all  Food Insecurity: No Food Insecurity   Worried About Charity fundraiser in the Last Year: Never true   Arboriculturist in the Last Year: Never true  Transportation Needs: No Transportation Needs   Lack of Transportation (Medical): No   Lack of Transportation (Non-Medical): No  Physical Activity: Sufficiently Active   Days of Exercise per Week: 5 days   Minutes of Exercise per Session: 60 min  Stress: No Stress Concern Present   Feeling of Stress : Not at all  Social Connections: Unknown   Frequency of Communication with Friends and Family: Not on file   Frequency of Social Gatherings with Friends and Family: Not on file   Attends Religious Services: Not on Electrical engineer or Organizations: Not on file   Attends Archivist Meetings: Not on file   Marital Status: Married    Tobacco Counseling Counseling given: Not Answered   Clinical Intake:  Pre-visit preparation completed: No        Diabetes: No  How often do you need to have someone help you when you read instructions, pamphlets, or other written materials from your doctor or pharmacy?: 1 - Never  Interpreter Needed?: No      Activities of Daily Living In your present state of health, do you have any difficulty performing the following activities: 10/01/2019  Hearing? N  Vision? N  Difficulty concentrating or making decisions? N  Walking or climbing stairs? N  Dressing or bathing? N  Doing errands, shopping? N  Preparing Food and eating ? N  Using the Toilet? N  In the  past six months, have you accidently leaked urine? N  Do you have problems with loss of bowel control? N  Managing your Medications? N  Managing your Finances? N  Housekeeping or managing your Housekeeping? N  Some recent data might be hidden    Patient Care Team: Crecencio Mc, MD as PCP - General (Internal Medicine) Evans Lance, MD as PCP - Electrophysiology (Cardiology) Garvin Fila, MD as Consulting Physician (Neurology) Annia Belt, MD as Consulting Physician (Hematology) Ethelle Lyon as Consulting Physician (Cardiology)  Indicate any  recent Medical Services you may have received from other than [facility] providers in the past year (date may be approximate).     Assessment:   This is a routine wellness examination for Kendra [name].  I connected with Shamon today by telephone and verified that I am speaking with the correct person using two identifiers. Location patient: home Location provider: work Persons participating in the virtual visit: patient, Marine scientist.    I discussed the limitations, risks, security and privacy concerns of performing an evaluation and management service by telephone and the availability of in person appointments. The patient expressed understanding and verbally consented to this telephonic visit.    Interactive audio and video telecommunications were attempted between this provider and patient, however failed, due to patient having technical difficulties OR patient did not have access to video capability.  We continued and completed visit with audio only.  Some vital signs may be absent or patient reported.   Hearing/Vision screen  Hearing Screening   125Hz  250Hz  500Hz  1000Hz  2000Hz  3000Hz  4000Hz  6000Hz  8000Hz   Right ear:           Left ear:           Comments: Patient is able to hear conversational tones without difficulty.  No issues reported.  Vision Screening Comments: Wears reader lenses Visual acuity not assessed, virtual visit.    They have seen their ophthalmologist in the last 12 months.     Dietary issues and exercise activities discussed: Current Exercise Habits: Home exercise routine, Type of exercise: walking;calisthenics (swimming), Time (Minutes): 60, Frequency (Times/Week): 5, Weekly Exercise (Minutes/Week): 300, Intensity: Moderate  Goals      Follow up with Primary Care Provider     As needed       Depression Screen [facility] 2/9 Scores 10/01/2019 09/30/2018 02/26/2015  PHQ - 2 Score 0 0 0  Some encounter information is confidential and restricted. Go to Review Flowsheets activity to see all data.    Fall Risk Fall Risk  10/01/2019 04/01/2019 09/30/2018 09/25/2018 04/12/2015  Falls in the past year? 0 0 0 (No Data) -  Comment - - - Emmi Telephone Survey: data to providers prior to load -  Number falls in past yr: 0 - - (No Data) -  Comment - - - Emmi Telephone Survey Actual Response =  -  Risk for fall due to: Comment - - - - Felled down steps.  Follow up Falls evaluation completed Falls evaluation completed - - -  Some encounter information is confidential and restricted. Go to Review Flowsheets activity to see all data.   Handrails in use when climbing stairs? Yes  Home free of loose throw rugs in walkways, pet beds, electrical cords, etc? Yes  Adequate lighting in your home to reduce risk of falls? Yes   ASSISTIVE DEVICES UTILIZED TO PREVENT FALLS: Use of a cane, walker or w/c? No  Grab bars in the bathroom? Yes  Shower chair or bench in shower? No  Elevated toilet seat or a handicapped toilet? Yes   TIMED UP AND GO:  Was the test performed? No . Virtual visit.   Cognitive Function: MMSE - Mini Mental State Exam 10/01/2019  Not completed: Unable to complete     6CIT Screen 09/30/2018  What Year? 0 points  What month? 0 points  What time? 0 points  Count back from 20 0 points  Months in reverse 0 points  Repeat phrase 0 points  Total Score 0    Immunizations Immunization History  Administered Date(s) Administered   Influenza Split 11/28/2014   Influenza Whole 11/27/2008   Influenza, High Dose Seasonal PF 11/10/2015   Influenza, Seasonal, Injecte, Preservative Fre 11/14/2007   Influenza-Unspecified 12/12/2016, 11/11/2017, 12/12/2018   Moderna SARS-COVID-2 Vaccination 03/14/2019, 04/11/2019   Pneumococcal Conjugate-13 04/21/2014   Pneumococcal Polysaccharide-23 11/27/2008, 06/21/2010, 03/15/2017   Td 02/27/2002   Tdap 12/11/2013, 04/26/2014   Zoster 04/26/2005, 06/20/2006   Zoster Recombinat (Shingrix) 11/11/2017, 05/01/2018    Health Maintenance Health Maintenance  Topic Date Due   INFLUENZA VACCINE  09/28/2019   MAMMOGRAM  03/19/2020   COLONOSCOPY  03/08/2023   TETANUS/TDAP  04/26/2024   DEXA SCAN  Completed   COVID-19 Vaccine  Completed   Hepatitis C Screening  Completed   PNA vac Low Risk Adult  Completed    Dental Screening: Recommended annual dental exams for proper oral hygiene  Community Resource Referral / Chronic Care Management: CRR required this visit?  No   CCM required this visit?  No      Plan:    Keep all routine maintenance appointments.   Follow up 04/06/19   I have personally reviewed and noted the following in the patient's chart:   Medical and social history Use of alcohol, tobacco or illicit drugs  Current medications and supplements Functional ability and status Nutritional status Physical activity Advanced directives List of other physicians Hospitalizations, surgeries, and ER visits in previous 12 months Vitals Screenings to include cognitive, depression, and falls Referrals and appointments  In addition, I have reviewed and discussed with patient certain preventive protocols, quality metrics, and best practice recommendations. A written personalized care plan for preventive services as well as general preventive health recommendations were provided to patient via mychart.     OBrien-Blaney, Nel Stoneking L,  LPN   07/29/3760     I have reviewed the above information and agree with above.   Deborra Medina, MD

## 2019-10-01 NOTE — Patient Instructions (Addendum)
Kendra [name] , Thank you for taking time to come for your Medicare Wellness Visit. I appreciate your ongoing commitment to your health goals. Please review the following plan we discussed and let me know if I can assist you in the future.   These are the goals we discussed: Goals    . Follow up with Primary Care Provider     As needed       This is a list of the screening recommended for you and due dates:  Health Maintenance  Topic Date Due  . Flu Shot  09/28/2019  . Mammogram  03/19/2020  . Colon Cancer Screening  03/08/2023  . Tetanus Vaccine  04/26/2024  . DEXA scan (bone density measurement)  Completed  . COVID-19 Vaccine  Completed  .  Hepatitis C: One time screening is recommended by Center for Disease Control  (CDC) for  adults born from [birth year] through 1966.   Completed  . Pneumonia vaccines  Completed    Immunizations Immunization History  Administered Date(s) Administered  . Influenza Split 11/28/2014  . Influenza Whole 11/27/2008  . Influenza, High Dose Seasonal PF 11/10/2015  . Influenza, Seasonal, Injecte, Preservative Fre 11/14/2007  . Influenza-Unspecified 12/12/2016, 11/11/2017, 12/12/2018  . Moderna SARS-COVID-2 Vaccination 03/14/2019, 04/11/2019  . Pneumococcal Conjugate-13 04/21/2014  . Pneumococcal Polysaccharide-23 11/27/2008, 06/21/2010, 03/15/2017  . Td 02/27/2002  . Tdap 12/11/2013, 04/26/2014  . Zoster 04/26/2005, 06/20/2006  . Zoster Recombinat (Shingrix) 11/11/2017, 05/01/2018   Keep all routine maintenance appointments.   Follow up 04/06/19   Advanced directives: End of life planning; Advance aging; Advanced directives discussed.  Copy of current HCPOA/Living Will requested.    Conditions/risks identified: none new.   Follow up in one year for your annual wellness visit.   Preventive Care 36 Years and Older, Female Preventive care refers to lifestyle choices and visits with your health care provider that can promote health and  wellness. What does preventive care include?  A yearly physical exam. This is also called an annual well check.  Dental exams once or twice a year.  Routine eye exams. Ask your health care provider how often you should have your eyes checked.  Personal lifestyle choices, including:  Daily care of your teeth and gums.  Regular physical activity.  Eating a healthy diet.  Avoiding tobacco and drug use.  Limiting alcohol use.  Practicing safe sex.  Taking low-dose aspirin every day.  Taking vitamin and mineral supplements as recommended by your health care provider. What happens during an annual well check? The services and screenings done by your health care provider during your annual well check will depend on your age, overall health, lifestyle risk factors, and family history of disease. Counseling  Your health care provider may ask you questions about your:  Alcohol use.  Tobacco use.  Drug use.  Emotional well-being.  Home and relationship well-being.  Sexual activity.  Eating habits.  History of falls.  Memory and ability to understand (cognition).  Work and work Statistician.  Reproductive health. Screening  You may have the following tests or measurements:  Height, weight, and BMI.  Blood pressure.  Lipid and cholesterol levels. These may be checked every 5 years, or more frequently if you are over 40 years old.  Skin check.  Lung cancer screening. You may have this screening every year starting at age 35 if you have a 30-pack-year history of smoking and currently smoke or have quit within the past 15 years.  Fecal occult blood test (  FOBT) of the stool. You may have this test every year starting at age 3.  Flexible sigmoidoscopy or colonoscopy. You may have a sigmoidoscopy every 5 years or a colonoscopy every 10 years starting at age 31.  Hepatitis C blood test.  Hepatitis B blood test.  Sexually transmitted disease (STD)  testing.  Diabetes screening. This is done by checking your blood sugar (glucose) after you have not eaten for a while (fasting). You may have this done every 1-3 years.  Bone density scan. This is done to screen for osteoporosis. You may have this done starting at age 54.  Mammogram. This may be done every 1-2 years. Talk to your health care provider about how often you should have regular mammograms. Talk with your health care provider about your test results, treatment options, and if necessary, the need for more tests. Vaccines  Your health care provider may recommend certain vaccines, such as:  Influenza vaccine. This is recommended every year.  Tetanus, diphtheria, and acellular pertussis (Tdap, Td) vaccine. You may need a Td booster every 10 years.  Zoster vaccine. You may need this after age 82.  Pneumococcal 13-valent conjugate (PCV13) vaccine. One dose is recommended after age 1.  Pneumococcal polysaccharide (PPSV23) vaccine. One dose is recommended after age 39. Talk to your health care provider about which screenings and vaccines you need and how often you need them. This information is not intended to replace advice given to you by your health care provider. Make sure you discuss any questions you have with your health care provider. Document Released: 03/12/2015 Document Revised: 11/03/2015 Document Reviewed: 12/15/2014 Elsevier Interactive Patient Education  2017 [place] Prevention in the Home Falls can cause injuries. They can happen to people of all ages. There are many things you can do to make your home safe and to help prevent falls. What can I do on the outside of my home?  Regularly fix the edges of walkways and driveways and fix any cracks.  Remove anything that might make you trip as you walk through a door, such as a raised step or threshold.  Trim any bushes or trees on the path to your home.  Use bright outdoor lighting.  Clear any walking  paths of anything that might make someone trip, such as rocks or tools.  Regularly check to see if handrails are loose or broken. Make sure that both sides of any steps have handrails.  Any raised decks and porches should have guardrails on the edges.  Have any leaves, snow, or ice cleared regularly.  Use sand or salt on walking paths during winter.  Clean up any spills in your garage right away. This includes oil or grease spills. What can I do in the bathroom?  Use night lights.  Install grab bars by the toilet and in the tub and shower. Do not use towel bars as grab bars.  Use non-skid mats or decals in the tub or shower.  If you need to sit down in the shower, use a plastic, non-slip stool.  Keep the floor dry. Clean up any water that spills on the floor as soon as it happens.  Remove soap buildup in the tub or shower regularly.  Attach bath mats securely with double-sided non-slip rug tape.  Do not have throw rugs and other things on the floor that can make you trip. What can I do in the bedroom?  Use night lights.  Make sure that you have a light  by your bed that is easy to reach.  Do not use any sheets or blankets that are too big for your bed. They should not hang down onto the floor.  Have a firm chair that has side arms. You can use this for support while you get dressed.  Do not have throw rugs and other things on the floor that can make you trip. What can I do in the kitchen?  Clean up any spills right away.  Avoid walking on wet floors.  Keep items that you use a lot in easy-to-reach places.  If you need to reach something above you, use a strong step stool that has a grab bar.  Keep electrical cords out of the way.  Do not use floor polish or wax that makes floors slippery. If you must use wax, use non-skid floor wax.  Do not have throw rugs and other things on the floor that can make you trip. What can I do with my stairs?  Do not leave any items  on the stairs.  Make sure that there are handrails on both sides of the stairs and use them. Fix handrails that are broken or loose. Make sure that handrails are as long as the stairways.  Check any carpeting to make sure that it is firmly attached to the stairs. Fix any carpet that is loose or worn.  Avoid having throw rugs at the top or bottom of the stairs. If you do have throw rugs, attach them to the floor with carpet tape.  Make sure that you have a light switch at the top of the stairs and the bottom of the stairs. If you do not have them, ask someone to add them for you. What else can I do to help prevent falls?  Wear shoes that:  Do not have high heels.  Have rubber bottoms.  Are comfortable and fit you well.  Are closed at the toe. Do not wear sandals.  If you use a stepladder:  Make sure that it is fully opened. Do not climb a closed stepladder.  Make sure that both sides of the stepladder are locked into place.  Ask someone to hold it for you, if possible.  Clearly mark and make sure that you can see:  Any grab bars or handrails.  First and last steps.  Where the edge of each step is.  Use tools that help you move around (mobility aids) if they are needed. These include:  Canes.  Walkers.  Scooters.  Crutches.  Turn on the lights when you go into a dark area. Replace any light bulbs as soon as they burn out.  Set up your furniture so you have a clear path. Avoid moving your furniture around.  If any of your floors are uneven, fix them.  If there are any pets around you, be aware of where they are.  Review your medicines with your doctor. Some medicines can make you feel dizzy. This can increase your chance of falling. Ask your doctor what other things that you can do to help prevent falls. This information is not intended to replace advice given to you by your health care provider. Make sure you discuss any questions you have with your health care  provider. Document Released: 12/10/2008 Document Revised: 07/22/2015 Document Reviewed: 03/20/2014 Elsevier Interactive Patient Education  2017 [employer].

## 2019-10-03 MED ORDER — LOSARTAN POTASSIUM 25 MG PO TABS
25.0000 mg | ORAL_TABLET | Freq: Every day | ORAL | 3 refills | Status: DC
Start: 2019-10-03 — End: 2020-03-18

## 2019-10-03 NOTE — Telephone Encounter (Signed)
Returned call to Pt.  Advised Dr. Lovena Le would recommend starting losartan 25 mg one tablet by mouth daily with blood work soon after.  Pt will start losartan.  She is in [employer].  She states she will be back in a week or so and will let this nurse know when she is here to get lab work.

## 2019-10-07 ENCOUNTER — Ambulatory Visit (INDEPENDENT_AMBULATORY_CARE_PROVIDER_SITE_OTHER): Payer: Medicare Other | Admitting: *Deleted

## 2019-10-07 DIAGNOSIS — Z5181 Encounter for therapeutic drug level monitoring: Secondary | ICD-10-CM

## 2019-10-07 DIAGNOSIS — Q2112 Patent foramen ovale: Secondary | ICD-10-CM

## 2019-10-07 DIAGNOSIS — I480 Paroxysmal atrial fibrillation: Secondary | ICD-10-CM

## 2019-10-07 DIAGNOSIS — Q211 Atrial septal defect: Secondary | ICD-10-CM

## 2019-10-07 DIAGNOSIS — I6350 Cerebral infarction due to unspecified occlusion or stenosis of unspecified cerebral artery: Secondary | ICD-10-CM

## 2019-10-07 LAB — POCT INR: INR: 3.5 — AB (ref 2.0–3.0)

## 2019-10-07 NOTE — Patient Instructions (Signed)
Description   Spoke with patient and instructed her to continue taking Warfarin 5mg daily except 2.5mg on Tuesdays, Thursdays, and Saturdays. Recheck INR in 2 weeks (self-tester). Call Coumadin Clinic # [phone] with any changes new medications or if scheduled for any procedures.      

## 2019-10-15 DIAGNOSIS — Z6823 Body mass index (BMI) 23.0-23.9, adult: Secondary | ICD-10-CM | POA: Diagnosis not present

## 2019-10-15 DIAGNOSIS — Z20822 Contact with and (suspected) exposure to covid-19: Secondary | ICD-10-CM | POA: Diagnosis not present

## 2019-10-15 DIAGNOSIS — Z79899 Other long term (current) drug therapy: Secondary | ICD-10-CM | POA: Diagnosis not present

## 2019-10-17 ENCOUNTER — Telehealth: Payer: Self-pay | Admitting: Internal Medicine

## 2019-10-17 NOTE — Telephone Encounter (Signed)
Pt would like to stop by the office next Tuesday for blood work, she is currently in [employer] and cannot have blood work today. Pt aware office will schedule her for next Tuesday, she is aware she may stop by the office anytime between 7:30 am - 4:30 pm.

## 2019-10-17 NOTE — Telephone Encounter (Signed)
Patient states she has been on losartan for a couple of weeks now and would like to come in and get some blood work done. There are no orders in - please advise.

## 2019-10-21 ENCOUNTER — Other Ambulatory Visit: Payer: Medicare Other | Admitting: *Deleted

## 2019-10-21 ENCOUNTER — Ambulatory Visit (INDEPENDENT_AMBULATORY_CARE_PROVIDER_SITE_OTHER): Payer: Medicare Other | Admitting: *Deleted

## 2019-10-21 ENCOUNTER — Other Ambulatory Visit: Payer: Self-pay

## 2019-10-21 DIAGNOSIS — Z5181 Encounter for therapeutic drug level monitoring: Secondary | ICD-10-CM

## 2019-10-21 DIAGNOSIS — Q211 Atrial septal defect: Secondary | ICD-10-CM

## 2019-10-21 DIAGNOSIS — I10 Essential (primary) hypertension: Secondary | ICD-10-CM | POA: Diagnosis not present

## 2019-10-21 DIAGNOSIS — I6350 Cerebral infarction due to unspecified occlusion or stenosis of unspecified cerebral artery: Secondary | ICD-10-CM | POA: Diagnosis not present

## 2019-10-21 DIAGNOSIS — Q2112 Patent foramen ovale: Secondary | ICD-10-CM

## 2019-10-21 DIAGNOSIS — I480 Paroxysmal atrial fibrillation: Secondary | ICD-10-CM

## 2019-10-21 LAB — BASIC METABOLIC PANEL
BUN/Creatinine Ratio: 16 (ref 12–28)
BUN: 14 mg/dL (ref 8–27)
CO2: 21 mmol/L (ref 20–29)
Calcium: 8.9 mg/dL (ref 8.7–10.3)
Chloride: 103 mmol/L (ref 96–106)
Creatinine, Ser: 0.9 mg/dL (ref 0.57–1.00)
GFR calc Af Amer: 72 mL/min/{1.73_m2} (ref >59)
GFR calc non Af Amer: 63 mL/min/{1.73_m2} (ref >59)
Glucose: 92 mg/dL (ref 65–99)
Potassium: 4.4 mmol/L (ref 3.5–5.2)
Sodium: 137 mmol/L (ref 134–144)

## 2019-10-21 LAB — POCT INR: INR: 3.2 — AB (ref 2.0–3.0)

## 2019-10-21 NOTE — Patient Instructions (Signed)
Description   Spoke with patient and instructed her to continue taking Warfarin 5mg daily except 2.5mg on Tuesdays, Thursdays, and Saturdays. Recheck INR in 2 weeks (self-tester). Call Coumadin Clinic # [phone] with any changes new medications or if scheduled for any procedures.      

## 2019-11-04 ENCOUNTER — Ambulatory Visit (INDEPENDENT_AMBULATORY_CARE_PROVIDER_SITE_OTHER): Payer: Medicare Other | Admitting: *Deleted

## 2019-11-04 DIAGNOSIS — Z5181 Encounter for therapeutic drug level monitoring: Secondary | ICD-10-CM | POA: Diagnosis not present

## 2019-11-04 DIAGNOSIS — I6350 Cerebral infarction due to unspecified occlusion or stenosis of unspecified cerebral artery: Secondary | ICD-10-CM

## 2019-11-04 DIAGNOSIS — Q211 Atrial septal defect: Secondary | ICD-10-CM

## 2019-11-04 DIAGNOSIS — Q2112 Patent foramen ovale: Secondary | ICD-10-CM

## 2019-11-04 DIAGNOSIS — I480 Paroxysmal atrial fibrillation: Secondary | ICD-10-CM

## 2019-11-04 LAB — POCT INR: INR: 3.1 — AB (ref 2.0–3.0)

## 2019-11-18 ENCOUNTER — Ambulatory Visit (INDEPENDENT_AMBULATORY_CARE_PROVIDER_SITE_OTHER): Payer: Medicare Other | Admitting: *Deleted

## 2019-11-18 DIAGNOSIS — Z5181 Encounter for therapeutic drug level monitoring: Secondary | ICD-10-CM

## 2019-11-18 DIAGNOSIS — I480 Paroxysmal atrial fibrillation: Secondary | ICD-10-CM

## 2019-11-18 DIAGNOSIS — Q2112 Patent foramen ovale: Secondary | ICD-10-CM

## 2019-11-18 DIAGNOSIS — Q211 Atrial septal defect: Secondary | ICD-10-CM | POA: Diagnosis not present

## 2019-11-18 DIAGNOSIS — I6350 Cerebral infarction due to unspecified occlusion or stenosis of unspecified cerebral artery: Secondary | ICD-10-CM | POA: Diagnosis not present

## 2019-11-18 DIAGNOSIS — Z7901 Long term (current) use of anticoagulants: Secondary | ICD-10-CM | POA: Diagnosis not present

## 2019-11-18 LAB — POCT INR: INR: 3.2 — AB (ref 2.0–3.0)

## 2019-11-18 NOTE — Patient Instructions (Signed)
Description   Spoke with patient and instructed her to continue taking Warfarin 5mg daily except 2.5mg on Tuesdays, Thursdays, and Saturdays. Recheck INR in 2 weeks (self-tester). Call Coumadin Clinic # [phone] with any changes new medications or if scheduled for any procedures.      

## 2019-12-02 ENCOUNTER — Ambulatory Visit (INDEPENDENT_AMBULATORY_CARE_PROVIDER_SITE_OTHER): Payer: Medicare Other | Admitting: Cardiology

## 2019-12-02 DIAGNOSIS — I480 Paroxysmal atrial fibrillation: Secondary | ICD-10-CM | POA: Diagnosis not present

## 2019-12-02 DIAGNOSIS — Z5181 Encounter for therapeutic drug level monitoring: Secondary | ICD-10-CM | POA: Diagnosis not present

## 2019-12-02 LAB — POCT INR: INR: 2.7 (ref 2.0–3.0)

## 2019-12-02 NOTE — Patient Instructions (Signed)
Description   Spoke with patient and instructed her to continue taking Warfarin 5mg daily except 2.5mg on Tuesdays, Thursdays, and Saturdays. Recheck INR in 2 weeks (self-tester). Call Coumadin Clinic # [phone] with any changes new medications or if scheduled for any procedures.      

## 2019-12-13 ENCOUNTER — Other Ambulatory Visit: Payer: Self-pay | Admitting: Internal Medicine

## 2019-12-15 DIAGNOSIS — Z23 Encounter for immunization: Secondary | ICD-10-CM | POA: Diagnosis not present

## 2019-12-16 ENCOUNTER — Ambulatory Visit (INDEPENDENT_AMBULATORY_CARE_PROVIDER_SITE_OTHER): Payer: Medicare Other | Admitting: *Deleted

## 2019-12-16 DIAGNOSIS — I480 Paroxysmal atrial fibrillation: Secondary | ICD-10-CM | POA: Diagnosis not present

## 2019-12-16 DIAGNOSIS — Z5181 Encounter for therapeutic drug level monitoring: Secondary | ICD-10-CM

## 2019-12-16 DIAGNOSIS — I6350 Cerebral infarction due to unspecified occlusion or stenosis of unspecified cerebral artery: Secondary | ICD-10-CM | POA: Diagnosis not present

## 2019-12-16 DIAGNOSIS — Q211 Atrial septal defect: Secondary | ICD-10-CM

## 2019-12-16 DIAGNOSIS — Q2112 Patent foramen ovale: Secondary | ICD-10-CM

## 2019-12-16 LAB — POCT INR: INR: 2.4 (ref 2.0–3.0)

## 2019-12-16 NOTE — Patient Instructions (Addendum)
Description   Spoke with patient and instructed her to take 5mg  today continue taking Warfarin 5mg  daily except 2.5mg  on Tuesdays, Thursdays, and Saturdays. Recheck INR in 2 weeks (self-tester). Call Coumadin Clinic # [phone] with any changes new medications or if scheduled for any procedures.

## 2019-12-26 ENCOUNTER — Other Ambulatory Visit: Payer: Self-pay | Admitting: Internal Medicine

## 2019-12-30 ENCOUNTER — Ambulatory Visit (INDEPENDENT_AMBULATORY_CARE_PROVIDER_SITE_OTHER): Payer: Medicare Other | Admitting: *Deleted

## 2019-12-30 DIAGNOSIS — Z5181 Encounter for therapeutic drug level monitoring: Secondary | ICD-10-CM | POA: Diagnosis not present

## 2019-12-30 DIAGNOSIS — I6350 Cerebral infarction due to unspecified occlusion or stenosis of unspecified cerebral artery: Secondary | ICD-10-CM | POA: Diagnosis not present

## 2019-12-30 DIAGNOSIS — Q2112 Patent foramen ovale: Secondary | ICD-10-CM

## 2019-12-30 DIAGNOSIS — I480 Paroxysmal atrial fibrillation: Secondary | ICD-10-CM | POA: Diagnosis not present

## 2019-12-30 DIAGNOSIS — Q211 Atrial septal defect: Secondary | ICD-10-CM

## 2019-12-30 LAB — POCT INR: INR: 3.2 — AB (ref 2.0–3.0)

## 2019-12-30 NOTE — Patient Instructions (Signed)
Description   Spoke with patient and instructed her to continue taking Warfarin 5mg  daily except 2.5mg  on Tuesdays, Thursdays, and Saturdays. Recheck INR in 2 weeks (self-tester). Call Coumadin Clinic # [phone] with any changes new medications or if scheduled for any procedures.

## 2019-12-31 DIAGNOSIS — Z23 Encounter for immunization: Secondary | ICD-10-CM | POA: Diagnosis not present

## 2020-01-13 ENCOUNTER — Ambulatory Visit (INDEPENDENT_AMBULATORY_CARE_PROVIDER_SITE_OTHER): Payer: Medicare Other | Admitting: *Deleted

## 2020-01-13 DIAGNOSIS — Q211 Atrial septal defect: Secondary | ICD-10-CM

## 2020-01-13 DIAGNOSIS — Q2112 Patent foramen ovale: Secondary | ICD-10-CM

## 2020-01-13 DIAGNOSIS — I480 Paroxysmal atrial fibrillation: Secondary | ICD-10-CM | POA: Diagnosis not present

## 2020-01-13 DIAGNOSIS — Z5181 Encounter for therapeutic drug level monitoring: Secondary | ICD-10-CM | POA: Diagnosis not present

## 2020-01-13 DIAGNOSIS — I6350 Cerebral infarction due to unspecified occlusion or stenosis of unspecified cerebral artery: Secondary | ICD-10-CM | POA: Diagnosis not present

## 2020-01-13 DIAGNOSIS — Z7901 Long term (current) use of anticoagulants: Secondary | ICD-10-CM | POA: Diagnosis not present

## 2020-01-13 LAB — POCT INR: INR: 3.3 — AB (ref 2.0–3.0)

## 2020-01-13 NOTE — Patient Instructions (Signed)
Description   Spoke with patient and instructed her to continue taking Warfarin 5mg  daily except 2.5mg  on Tuesdays, Thursdays, and Saturdays. Recheck INR in 2 weeks (self-tester). Call [facility] # [phone] with any changes new medications or if scheduled for any procedures.

## 2020-01-27 ENCOUNTER — Ambulatory Visit (INDEPENDENT_AMBULATORY_CARE_PROVIDER_SITE_OTHER): Payer: Medicare Other

## 2020-01-27 DIAGNOSIS — Q211 Atrial septal defect: Secondary | ICD-10-CM | POA: Diagnosis not present

## 2020-01-27 DIAGNOSIS — Q2112 Patent foramen ovale: Secondary | ICD-10-CM

## 2020-01-27 DIAGNOSIS — I480 Paroxysmal atrial fibrillation: Secondary | ICD-10-CM | POA: Diagnosis not present

## 2020-01-27 DIAGNOSIS — I6350 Cerebral infarction due to unspecified occlusion or stenosis of unspecified cerebral artery: Secondary | ICD-10-CM | POA: Diagnosis not present

## 2020-01-27 DIAGNOSIS — Z5181 Encounter for therapeutic drug level monitoring: Secondary | ICD-10-CM

## 2020-01-27 LAB — POCT INR: INR: 2.4 (ref 2.0–3.0)

## 2020-01-27 NOTE — Patient Instructions (Signed)
Description   Spoke with patient and instructed her to take 1 tablet today, then resume same dosage of Warfarin 5mg  daily except 2.5mg  on Tuesdays, Thursdays, and Saturdays. Recheck INR in 2 weeks (self-tester). Call [facility] # [phone] with any changes new medications or if scheduled for any procedures.

## 2020-02-04 DIAGNOSIS — H26493 Other secondary cataract, bilateral: Secondary | ICD-10-CM | POA: Diagnosis not present

## 2020-02-04 DIAGNOSIS — H52203 Unspecified astigmatism, bilateral: Secondary | ICD-10-CM | POA: Diagnosis not present

## 2020-02-10 ENCOUNTER — Ambulatory Visit (INDEPENDENT_AMBULATORY_CARE_PROVIDER_SITE_OTHER): Payer: Medicare Other

## 2020-02-10 DIAGNOSIS — Z5181 Encounter for therapeutic drug level monitoring: Secondary | ICD-10-CM | POA: Diagnosis not present

## 2020-02-10 DIAGNOSIS — I6350 Cerebral infarction due to unspecified occlusion or stenosis of unspecified cerebral artery: Secondary | ICD-10-CM

## 2020-02-10 DIAGNOSIS — I480 Paroxysmal atrial fibrillation: Secondary | ICD-10-CM | POA: Diagnosis not present

## 2020-02-10 DIAGNOSIS — Q2112 Patent foramen ovale: Secondary | ICD-10-CM

## 2020-02-10 DIAGNOSIS — Q211 Atrial septal defect: Secondary | ICD-10-CM

## 2020-02-10 LAB — POCT INR: INR: 3.2 — AB (ref 2.0–3.0)

## 2020-02-10 NOTE — Patient Instructions (Signed)
Description   Spoke with patient and instructed her to take 1 tablet today, then resume same dosage of Warfarin 5mg  daily except 2.5mg  on Tuesdays, Thursdays, and Saturdays. Recheck INR in 2 weeks (self-tester). Call [facility] Clinic # [phone] with any changes new medications or if scheduled for any procedures.

## 2020-02-12 DIAGNOSIS — H26491 Other secondary cataract, right eye: Secondary | ICD-10-CM | POA: Diagnosis not present

## 2020-02-24 ENCOUNTER — Ambulatory Visit (INDEPENDENT_AMBULATORY_CARE_PROVIDER_SITE_OTHER): Payer: Medicare Other | Admitting: *Deleted

## 2020-02-24 DIAGNOSIS — I6350 Cerebral infarction due to unspecified occlusion or stenosis of unspecified cerebral artery: Secondary | ICD-10-CM | POA: Diagnosis not present

## 2020-02-24 DIAGNOSIS — Q211 Atrial septal defect: Secondary | ICD-10-CM | POA: Diagnosis not present

## 2020-02-24 DIAGNOSIS — I480 Paroxysmal atrial fibrillation: Secondary | ICD-10-CM

## 2020-02-24 DIAGNOSIS — Z5181 Encounter for therapeutic drug level monitoring: Secondary | ICD-10-CM

## 2020-02-24 DIAGNOSIS — Q2112 Patent foramen ovale: Secondary | ICD-10-CM

## 2020-02-24 LAB — POCT INR: INR: 2.9 (ref 2.0–3.0)

## 2020-02-24 NOTE — Patient Instructions (Signed)
Description   Spoke with patient and instructed her to continue taking Warfarin 5mg daily except 2.5mg on Tuesdays, Thursdays, and Saturdays. Recheck INR in 2 weeks (self-tester). Call Coumadin Clinic # [phone] with any changes new medications or if scheduled for any procedures.      

## 2020-03-02 DIAGNOSIS — M2011 Hallux valgus (acquired), right foot: Secondary | ICD-10-CM | POA: Diagnosis not present

## 2020-03-05 ENCOUNTER — Ambulatory Visit (INDEPENDENT_AMBULATORY_CARE_PROVIDER_SITE_OTHER): Payer: Medicare Other | Admitting: Internal Medicine

## 2020-03-05 ENCOUNTER — Encounter: Payer: Self-pay | Admitting: Internal Medicine

## 2020-03-05 ENCOUNTER — Other Ambulatory Visit: Payer: Self-pay

## 2020-03-05 VITALS — BP 122/60 | HR 66 | Ht 68.0 in | Wt 155.8 lb

## 2020-03-05 DIAGNOSIS — I10 Essential (primary) hypertension: Secondary | ICD-10-CM | POA: Diagnosis not present

## 2020-03-05 DIAGNOSIS — I480 Paroxysmal atrial fibrillation: Secondary | ICD-10-CM

## 2020-03-05 NOTE — Progress Notes (Signed)
HPI Kendra [name] returns today for followup. She is a pleasant 75 yo woman with a h/o HTN, remote atrial fib, recurrent thromboembolic events usually when her INR was subtherapeutic or once when she forgot her pradaxa. She has done well in the interim except that she has developed pain in her feet and has a bunion that needs surgery. She denies chest pain or sob or edema. Allergies  Allergen Reactions  . Amiodarone Hcl Nausea Only and Rash     Current Outpatient Medications  Medication Sig Dispense Refill  . alendronate (FOSAMAX) 70 MG tablet TAKE 1 TABLET EVERY 7 DAYS WITH A FULL GLASS WATER ON AN EMPTY STOMACH 12 tablet 2  . aspirin 81 MG tablet Take 81 mg by mouth daily.    . cholecalciferol (VITAMIN D) 1000 units tablet Take 1,000 Units by mouth daily.    . furosemide (LASIX) 20 MG tablet TAKE 1 TABLET BY MOUTH EVERY DAY 90 tablet 3  . losartan (COZAAR) 25 MG tablet Take 1 tablet (25 mg total) by mouth daily. 90 tablet 3  . Magnesium 100 MG TABS Take by mouth daily.    . metoprolol succinate (TOPROL-XL) 100 MG 24 hr tablet TAKE 1 TABLET EVERY DAY WITH OR AFTER A MEAL 90 tablet 2  . rosuvastatin (CRESTOR) 5 MG tablet TAKE 1 TABLET BY MOUTH EVERY DAY 90 tablet 3  . warfarin (COUMADIN) 5 MG tablet TAKE 1/2 TO 1 TABLET BY MOUTH DAILY AS DIRECTED BY THE COUMADIN CLINIC 90 tablet 1  . Zinc 30 MG TABS Take 1 tablet by mouth daily.     No current facility-administered medications for this visit.     Past Medical History:  Diagnosis Date  . Arthus phenomenon   . Atrial fibrillation ([place])   . Atrial flutter ([place])   . Chronic anticoagulation 12/14/2015  . 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 GERD (gastroesophageal reflux disease)   . Hemorrhoids   . Hx of blood clots   . Hypercholesterolemia   . Kidney infarction [facility])   . Stroke ([place])   . TIA (transient ischemic attack)    as per 07/30/00 note from [facility]     ROS:   All  systems reviewed and negative except as noted in the HPI.   Past Surgical History:  Procedure Laterality Date  . ablasion    . APPENDECTOMY    . BREAST BIOPSY Right   . CARDIAC ELECTROPHYSIOLOGY STUDY AND ABLATION    . CESAREAN SECTION    . fibroid tumor removal  1996  . RADIOLOGY WITH ANESTHESIA N/A 02/16/2015   Procedure: RADIOLOGY WITH ANESTHESIA;  Surgeon: Medication Radiologist, MD;  Location: [place] [facility]URO ORS;  Service: Radiology;  Laterality: N/A;  . TEE WITHOUT CARDIOVERSION N/A 03/08/2015   Procedure: TRANSESOPHAGEAL ECHOCARDIOGRAM (TEE);  Surgeon: [name], MD;  Location: [place];  Service: Cardiovascular;  Laterality: N/A;  . TEE WITHOUT CARDIOVERSION N/A 04/28/2015   Procedure: TRANSESOPHAGEAL ECHOCARDIOGRAM (TEE);  Surgeon: [name], MD;  Location: [facility] ENDOSCOPY;  Service: Cardiovascular;  Laterality: N/A;     Family History  Problem Relation Age of Onset  . Stroke Maternal Grandmother   . Alcohol abuse Father   . Lung cancer Father   . Other Brother        Emotional Illness  . Lung cancer Brother   . Colon cancer Neg Hx      Social History   Socioeconomic History  .  Marital status: Married    Spouse name: Not on file  . Number of children: 4  . Years of education: Not on file  . Highest education level: Not on file  Occupational History  . 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 Smoking status: Never Smoker  . Smokeless tobacco: Never Used  Vaping Use  . Vaping Use: Never used  Substance and Sexual Activity  . Alcohol use: No    Alcohol/week: 0.0 standard drinks  . Drug use: No  . Sexual activity: Not on file  Other Topics Concern  . Not on file  Social History Narrative   ** Merged History Encounter **       Lives with husband   No caffeine drinks    Social Determinants of Radio broadcast assistant Strain: Not on file  Food Insecurity: No Food Insecurity  . Worried About Charity fundraiser in the Last Year:  Never true  . Ran Out of Food in the Last Year: Never true  Transportation Needs: No Transportation Needs  . Lack of Transportation (Medical): No  . Lack of Transportation (Non-Medical): No  Physical Activity: Sufficiently Active  . Days of Exercise per Week: 5 days  . Minutes of Exercise per Session: 60 min  Stress: No Stress Concern Present  . Feeling of Stress : Not at all  Social Connections: Unknown  . Frequency of Communication with Friends and Family: Not on file  . Frequency of Social Gatherings with Friends and Family: Not on file  . Attends Religious Services: Not on file  . Active Member of Clubs or Organizations: Not on file  . Attends Archivist Meetings: Not on file  . Marital Status: Married  Human resources officer Violence: Not At Risk  . Fear of Current or Ex-Partner: No  . Emotionally Abused: No  . Physically Abused: No  . Sexually Abused: No     BP 122/60   Pulse 66   Ht 5\' 8"  (1.727 m)   Wt 155 lb 12.8 oz (70.7 kg)   SpO2 96%   BMI 23.69 kg/m   Physical Exam:  Well appearing NAD HEENT: Unremarkable Neck:  No JVD, no thyromegally Lymphatics:  No adenopathy Back:  No CVA tenderness Lungs:  Clear with no wheezes HEART:  Regular rate rhythm, no murmurs, no rubs, no clicks Abd:  soft, positive bowel sounds, no organomegally, no rebound, no guarding Ext:  2 plus pulses, no edema, no cyanosis, no clubbing Skin:  No rashes no nodules Neuro:  CN II through XII intact, motor grossly intact  EKG - nsr   Assess/Plan: 1. Preoperative eval - she is low risk for bunion surgery.  2. Coags -she will need management of her systemic anti-coagulation around the time of her bunion surgery as she is a high risk for embolic events. Once we have a surgical date scheduled, she will be seen in our pharm D clinic for lovenox.  3. HTN - her bp is well controlled. No change. 4. Atrial fib - she has not had any in the last year, at least by her symptoms.   Carleene Overlie  Haig Gerardo,MD

## 2020-03-05 NOTE — Patient Instructions (Signed)

## 2020-03-09 ENCOUNTER — Telehealth: Payer: Self-pay | Admitting: *Deleted

## 2020-03-09 ENCOUNTER — Ambulatory Visit (INDEPENDENT_AMBULATORY_CARE_PROVIDER_SITE_OTHER): Payer: Medicare Other | Admitting: *Deleted

## 2020-03-09 DIAGNOSIS — Z5181 Encounter for therapeutic drug level monitoring: Secondary | ICD-10-CM

## 2020-03-09 DIAGNOSIS — Q2112 Patent foramen ovale: Secondary | ICD-10-CM

## 2020-03-09 DIAGNOSIS — Z7901 Long term (current) use of anticoagulants: Secondary | ICD-10-CM | POA: Diagnosis not present

## 2020-03-09 DIAGNOSIS — Q211 Atrial septal defect: Secondary | ICD-10-CM

## 2020-03-09 DIAGNOSIS — I480 Paroxysmal atrial fibrillation: Secondary | ICD-10-CM | POA: Diagnosis not present

## 2020-03-09 DIAGNOSIS — I6350 Cerebral infarction due to unspecified occlusion or stenosis of unspecified cerebral artery: Secondary | ICD-10-CM | POA: Diagnosis not present

## 2020-03-09 LAB — POCT INR: INR: 3.1 — AB (ref 2.0–3.0)

## 2020-03-09 NOTE — Telephone Encounter (Signed)
   [facility] Pre-operative Risk Assessment    HEARTCARE STAFF: - Please ensure there is not already an duplicate clearance open for this procedure. - Under Visit Info/Reason for Call, type in Other and utilize the format Clearance MM/DD/YY or Clearance TBD. Do not use dashes or single digits. - If request is for dental extraction, please clarify the # of teeth to be extracted.  Request for surgical clearance:  1. What type of surgery is being performed? HALLUX VALGUS LAPIDUS RIGHT FOOT   2. When is this surgery scheduled? 04/02/20   3. What type of clearance is required (medical clearance vs. Pharmacy clearance to hold med vs. Both)? BOTH  4. Are there any medications that need to be held prior to surgery and how long? MED LIST REFLECTS PT IS ON BOTH WARFARIN AND ASA   5. Practice name and name of physician performing surgery? [facility]; DR. Larkin Ina FOWLER   6. What is the office phone number? [phone]   7.   What is the office fax number? [phone]  8.   Anesthesia type (None, local, MAC, general) ? CHOICE   Julaine Hua 03/09/2020, 4:31 PM  _________________________________________________________________   (provider comments below)

## 2020-03-09 NOTE — Telephone Encounter (Signed)
   Primary Cardiologist: Dr. Lovena Le  Chart reviewed as part of pre-operative protocol coverage. Given past medical history and time since last visit, based on ACC/AHA guidelines, Kendra [name] would be at acceptable risk for the planned procedure without further cardiovascular testing.  Patient was recently seen and cleared by Dr. Lovena Le.  Per Dr. Lovena Le, she will need Lovenox bridging due to high risk of embolic events.  Will defer to our clinical pharmacist.  The patient was advised that if she develops new symptoms prior to surgery to contact our office to arrange for a follow-up visit, and she verbalized understanding.  I will route this recommendation to the requesting party via Epic fax function and remove from pre-op pool.  Please call with questions.  [place], [place] 03/09/2020, 4:44 PM

## 2020-03-10 NOTE — Telephone Encounter (Signed)
Patient with diagnosis of aflutter on warfarin for anticoagulation, also with history of PFO, factor VIII deficiency, and recurrent thromboembolic events.    Procedure: bunion surgey Date of procedure: 04/02/20  CHA2DS2-VASc Score = 6  This indicates a 9.7% annual risk of stroke. [name] score is based upon: CHF History: No HTN History: Yes Diabetes History: No Stroke History: Yes (recurrent) Vascular Disease History: No Age Score: 2 Gender Score: 1  CrCl 72mL/min Platelet count 223K  Per office protocol, patient can hold warfarin for 5 days prior to procedure. She will need bridging with Lovenox around procedure. Pt is a self tester, next INR is due on 1/25 and Lovenox bridge will be coordinated then. Can dose at 100mg  once daily using 1.5mg /kg dosing.

## 2020-03-11 DIAGNOSIS — H26492 Other secondary cataract, left eye: Secondary | ICD-10-CM | POA: Diagnosis not present

## 2020-03-23 ENCOUNTER — Encounter: Payer: Self-pay | Admitting: *Deleted

## 2020-03-23 ENCOUNTER — Ambulatory Visit (INDEPENDENT_AMBULATORY_CARE_PROVIDER_SITE_OTHER): Payer: Medicare Other | Admitting: Cardiology

## 2020-03-23 DIAGNOSIS — Q211 Atrial septal defect: Secondary | ICD-10-CM | POA: Diagnosis not present

## 2020-03-23 DIAGNOSIS — I6350 Cerebral infarction due to unspecified occlusion or stenosis of unspecified cerebral artery: Secondary | ICD-10-CM

## 2020-03-23 DIAGNOSIS — I480 Paroxysmal atrial fibrillation: Secondary | ICD-10-CM

## 2020-03-23 DIAGNOSIS — M2011 Hallux valgus (acquired), right foot: Secondary | ICD-10-CM | POA: Diagnosis not present

## 2020-03-23 DIAGNOSIS — Z5181 Encounter for therapeutic drug level monitoring: Secondary | ICD-10-CM

## 2020-03-23 DIAGNOSIS — Q2112 Patent foramen ovale: Secondary | ICD-10-CM

## 2020-03-23 LAB — POCT INR: INR: 2.8 (ref 2.0–3.0)

## 2020-03-23 MED ORDER — ENOXAPARIN SODIUM 100 MG/ML ~~LOC~~ SOLN: 100.0000 mg | SUBCUTANEOUS | 1 refills | Status: DC

## 2020-03-23 NOTE — Telephone Encounter (Signed)
Dr. Lovena Le- ok for patient to hold ASA for 5 days for her surgery?

## 2020-03-23 NOTE — Patient Instructions (Addendum)
   Description   Spoke with patient and husband and instructed her to continue taking Warfarin 5mg  daily except 2.5mg  on Tuesdays, Thursdays, and Saturdays. Please follow Bridge instructions. Recheck INR on 2/10. Call Coumadin Clinic # [phone] with any changes in medications, if scheduled for any procedures or have any questions.     1/29: Last dose of warfarin.  1/30: No warfarin or enoxaparin (Lovenox).  1/31: Inject enoxaparin 100 mg in the fatty abdominal tissue at least 2 inches from the belly button once daily at 7 pm. No warfarin  2/1: Inject enoxaparin in the fatty tissue once daily at 7 pm. No warfarin.  2/2: Inject enoxaparin in the fatty tissue once daily at 7 pm. No warfarin.  2/3: No Lovenox. No warfarin.  2/4: Procedure Day - No enoxaparin - Resume warfarin in the evening or as directed by doctor (take an extra half tablet with usual dose for 2 days then resume normal dose).  2/5: Resume enoxaparin inject in the fatty tissue once daily at 8 am and take warfarin.   2/6: Inject enoxaparin in the fatty tissue once daily at 8 am and take warfarin  2/7: Inject enoxaparin in the fatty tissue once daily at 8am and take warfarin  2/8: Inject enoxaparin in the fatty tissue once daily at 8 am and take warfarin  2/9: Inject enoxaparin in the fatty tissue once daily at 8 am and take warfarin  2/10: warfarin appt to check INR.

## 2020-03-24 ENCOUNTER — Other Ambulatory Visit: Payer: Self-pay | Admitting: Podiatry

## 2020-03-24 LAB — HM MAMMOGRAPHY

## 2020-03-24 MED ORDER — ENOXAPARIN SODIUM 100 MG/ML ~~LOC~~ SOLN: 100.0000 mg | SUBCUTANEOUS | 1 refills | Status: DC

## 2020-03-24 NOTE — Addendum Note (Signed)
Addended by: Marcelle Overlie D on: 03/24/2020 01:00 PM   Modules accepted: Orders

## 2020-03-24 NOTE — Telephone Encounter (Signed)
Patient made aware. States [facility] doesn't have her lovenox rx. Will send again

## 2020-03-24 NOTE — Telephone Encounter (Signed)
Per Dr. Lovena Le, ok to hold ASA. Called pt to let her know. No answer LVM to call back

## 2020-03-26 ENCOUNTER — Encounter
Admission: RE | Admit: 2020-03-26 | Discharge: 2020-03-26 | Disposition: A | Discharge status: Home / Self Care | Payer: Medicare Other | Source: Ambulatory Visit | Attending: Podiatry | Admitting: Podiatry

## 2020-03-26 ENCOUNTER — Other Ambulatory Visit: Payer: Self-pay

## 2020-03-26 ENCOUNTER — Other Ambulatory Visit
Admission: RE | Admit: 2020-03-26 | Discharge: 2020-03-26 | Disposition: A | Discharge status: Home / Self Care | Payer: Medicare Other | Source: Ambulatory Visit | Attending: Podiatry | Admitting: Podiatry

## 2020-03-26 DIAGNOSIS — Z01812 Encounter for preprocedural laboratory examination: Secondary | ICD-10-CM | POA: Diagnosis not present

## 2020-03-26 HISTORY — DX: Personal history of urinary calculi: Z87.442

## 2020-03-26 LAB — BASIC METABOLIC PANEL
Anion gap: 9 (ref 5–15)
BUN: 14 mg/dL (ref 8–23)
CO2: 25 mmol/L (ref 22–32)
Calcium: 9 mg/dL (ref 8.9–10.3)
Chloride: 104 mmol/L (ref 98–111)
Creatinine, Ser: 0.84 mg/dL (ref 0.44–1.00)
GFR, Estimated: >60 mL/min (ref >60)
Glucose, Bld: 94 mg/dL (ref 70–99)
Potassium: 4.6 mmol/L (ref 3.5–5.1)
Sodium: 138 mmol/L (ref 135–145)

## 2020-03-26 LAB — CBC
HCT: 41 % (ref 36.0–46.0)
Hemoglobin: 14.4 g/dL (ref 12.0–15.0)
MCH: 31.6 pg (ref 26.0–34.0)
MCHC: 35.1 g/dL (ref 30.0–36.0)
MCV: 90.1 fL (ref 80.0–100.0)
Platelets: 212 10*3/uL (ref 150–400)
RBC: 4.55 MIL/uL (ref 3.87–5.11)
RDW: 12.2 % (ref 11.5–15.5)
WBC: 6.8 10*3/uL (ref 4.0–10.5)
nRBC: 0 % (ref 0.0–0.2)

## 2020-03-26 NOTE — Patient Instructions (Addendum)
Your procedure is scheduled on: Friday, February 4 Report to the Registration Desk on the 1st floor of the [facility]. To find out your arrival time, please call [phone] between 1PM - 3PM on: Thursday, February 3  REMEMBER: Instructions that are not followed completely may result in serious medical risk, up to and including death; or upon the discretion of your surgeon and anesthesiologist your surgery may need to be rescheduled.  Do not eat food after midnight the night before surgery.  No gum chewing, lozengers or hard candies.  You may however, drink CLEAR liquids up to 2 hours before you are scheduled to arrive for your surgery. Do not drink anything within 2 hours of your scheduled arrival time.  Clear liquids include: - water  - apple juice without pulp - gatorade (not RED, PURPLE, OR BLUE) - black coffee or tea (Do NOT add milk or creamers to the coffee or tea) Do NOT drink anything that is not on this list.  In addition, your doctor has ordered for you to drink the provided  Ensure Pre-Surgery Clear Carbohydrate Drink  Drinking this carbohydrate drink up to two hours before surgery helps to reduce insulin resistance and improve patient outcomes. Please complete drinking 2 hours prior to scheduled arrival time.  TAKE THESE MEDICATIONS THE MORNING OF SURGERY WITH A SIP OF WATER:  1.  Metoprolol  Follow recommendations from Cardiologist regarding stopping Aspirin, Coumadin.  Hold aspirin and coumadin for 5 days prior to procedure (last day to take is Saturday, January 29); lovenox bridge per instructed. Resume as instructed.  One week prior to surgery: starting January 28 Stop Anti-inflammatories (NSAIDS) such as Advil, Aleve, Ibuprofen, Motrin, Naproxen, Naprosyn and Aspirin based products such as Excedrin, Goodys Powder, BC Powder. Stop ANY OVER THE COUNTER supplements until after surgery. (Zinc, magnesium) (However, you may continue taking Vitamin D and multivitamin  up until the day before surgery.)  No Alcohol for 24 hours before or after surgery.  On the morning of surgery brush your teeth with toothpaste and water, you may rinse your mouth with mouthwash if you wish. Do not swallow any toothpaste or mouthwash.  Do not wear jewelry, make-up, hairpins, clips or nail polish.  Do not wear lotions, powders, or perfumes.   Do not shave body from the neck down 48 hours prior to surgery just in case you cut yourself which could leave a site for infection.  Also, freshly shaved skin may become irritated if using the CHG soap.  Do not bring valuables to the hospital. [facility] is not responsible for any missing/lost belongings or valuables.   Use CHG Soap as directed on instruction sheet.  Notify your doctor if there is any change in your medical condition (cold, fever, infection).  Wear comfortable clothing (specific to your surgery type) to the hospital.  Plan for stool softeners for home use; pain medications have a tendency to cause constipation. You can also help prevent constipation by eating foods high in fiber such as fruits and vegetables and drinking plenty of fluids as your diet allows.  After surgery, you can help prevent lung complications by doing breathing exercises.  Take deep breaths and cough every 1-2 hours. Your doctor may order a device called an Incentive Spirometer to help you take deep breaths.  If you are being discharged the day of surgery, you will not be allowed to drive home. You will need a responsible adult (18 years or older) to drive you home and stay  with you that night.   Please call the [facility] Dept. at [phone] if you have any questions about these instructions.  Visitation Policy:  Patients undergoing a surgery or procedure may have one family member or support person with them as long as that person is not COVID-19 positive or experiencing its symptoms.  That person may remain in the  waiting area during the procedure.

## 2020-03-29 ENCOUNTER — Other Ambulatory Visit: Payer: Self-pay | Admitting: Internal Medicine

## 2020-03-30 ENCOUNTER — Telehealth: Payer: Self-pay | Admitting: *Deleted

## 2020-03-30 ENCOUNTER — Encounter: Payer: Self-pay | Admitting: Podiatry

## 2020-03-30 NOTE — Telephone Encounter (Signed)
Pt's husband called this morning, he stated that he has given his wife Lovenox several times before in past but it had been a while and he was wanting to make sure that he was injecting the medication correctly. He stated he gave pt a Lovenox shot last night and pt stated it hurt. Pt's husband verbalized how to give the injections and I informed him it sounds like he was giving the medication correctly. Informed him that he could go to [email or web address] and there is a video on how to give the Lovenox injections so he could have a visual demonstration. Informed him to call back if he has any other questions.

## 2020-03-30 NOTE — Progress Notes (Signed)
[facility] Perioperative Services  Pre-Admission/Anesthesia Testing Clinical Review  Date: 03/30/20  Patient Demographics:  Name: Kendra [name] DOB:   16-Jan-1946 MRN:   [id number]  Planned Surgical Procedure(s):    Case: [id number] Date/Time: 04/02/20 0715   Procedure: HALLUX VALGUS LAPIDUS (Right )   Anesthesia type: Choice   Pre-op diagnosis: M20.11  HALLUX VALGUS RIGHT FOOT   Location: [facility] OR ROOM 03 / [facility] FOR ANESTHESIA GROUP   Surgeons: Samara Deist, DPM    NOTE: Available PAT nursing documentation and vital signs have been reviewed. Clinical nursing staff has updated patient's PMH/PSHx, current medication list, and drug allergies/intolerances to ensure comprehensive history available to assist in medical decision making as it pertains to the aforementioned surgical procedure and anticipated anesthetic course.   Clinical Discussion:  Kendra [name] is a 75 y.o. female who is submitted for pre-surgical anesthesia review and clearance prior to her undergoing the above procedure. Patient has never been a smoker. Pertinent PMH includes: atrial fibrillation/flutter, PFO, secundum ASD, CVA/TIA, factor VIII deficiency, multiple thromboembolic events, secondary erythrocytosis, HTN, HLD, renal infarction, GERD (on PRN interventions).  Patient is followed by cardiology Lovena Le, MD). She was last seen in the cardiology clinic on 03/05/2020; notes reviewed.  At the time of her clinic visit, patient noted to be doing well overall from a cardiovascular perspective. She denied chest pain, shortness of breath, orthopnea, PND, palpitations, peripheral edema, vertiginous symptoms, and presyncope/syncope.  Patient with a past medical history of atrial tachyarrhythmias.  She has undergone ablation procedures in the past. CHA2DS2-VASc Score = 6.  Patient chronically anticoagulated using warfarin; compliant with therapy with no evidence of GI bleeding.  Patient is being rate  controlled with high-dose beta-blocker therapy.  She has not had any episodes of symptomatic atrial fibrillation in the past year.  Last TEE was performed on 04/28/2015 revealing normal left ventricular systolic function with an EF of 60-65%.  Secundum ASD present (see full interpretation of cardiovascular testing below).  Patient has been seen in the past at [facility] to discuss surgery to correct PFO/ASD, however surgical intervention was not recommended by interventional cardiologist.  Patient on GDMT for her HTN and HLD diagnoses.  Blood pressure well controlled at 122/60 on currently prescribed ARB, diuretic, and beta-blocker therapies.  She is on a statin for her HLD.  ECG performed in the office revealed NSR at a rate of 66 bpm.  No changes were made to patient's medication regimen.  Patient to continue follow-up with outpatient cardiology for ongoing evaluation and management.  Patient is scheduled to undergo an elective podiatric procedure on 04/02/2020 with Dr. Samara Deist.  Given patient's past medical history significant for cardiovascular diagnoses, presurgical cardiac clearance was sought by PAT team.  Per cardiology, "she is low risk for bunion surgery, however will need management of her systemic anticoagulation around the time of her surgery as she is at high risk for embolic events".  Again patient being chronically anticoagulated using warfarin.  [place] pharmacist has reviewed patient's history and plans for upcoming surgery.  Pharmacist has reached out to discuss need for enoxaparin bridge prior to her surgery; husband managing bridge per pharmacy recommendations. Additionally, patient is on daily antiplatelet therapy. She will hold her daily low-dose ASA for 5 days prior to her procedure; last dose of ASA will be on 03/28/2020.  She denies previous perioperative complications with anesthesia. She underwent a general anesthetic course at [facility] (ASA IV) in 01/2015 with no  documented complications.   Vitals with BMI 03/26/2020 03/05/2020 10/01/2019  Height 5\' 8"  5\' 8"  5\' 8"   Weight 153 lbs 155 lbs 13 oz 154 lbs  BMI 23.27 41.32 44.01  Systolic - 027 (No Data)  Diastolic - 60 (No Data)  Pulse - 66 -    Providers/Specialists:   NOTE: Primary physician provider listed below. Patient may have been seen by APP or partner within same practice.   PROVIDER ROLE / SPECIALTY LAST Montel Culver, DPM Podiatry (Surgeon)  03/23/2020  Crecencio Mc, MD Primary Care Provider  04/01/2019  Osie Cheeks, MD Cardiology  03/05/2020   Allergies:  Amiodarone hcl  Current Home Medications:   No current facility-administered medications for this encounter.   Marland Kitchen alendronate (FOSAMAX) 70 MG tablet  . aspirin 81 MG tablet  . cholecalciferol (VITAMIN D) 1000 units tablet  . furosemide (LASIX) 20 MG tablet  . losartan (COZAAR) 25 MG tablet  . Magnesium 100 MG TABS  . metoprolol succinate (TOPROL-XL) 100 MG 24 hr tablet  . Multiple Vitamins-Minerals (MULTIVITAMIN WITH MINERALS) tablet  . rosuvastatin (CRESTOR) 5 MG tablet  . warfarin (COUMADIN) 5 MG tablet  . Zinc 30 MG TABS  . enoxaparin (LOVENOX) 100 MG/ML injection   History:   Past Medical History:  Diagnosis Date  . Age related osteoporosis   . Arthus phenomenon   . Atrial fibrillation ([place])   . Atrial flutter ([place])   . B12 deficiency   . Chronic anticoagulation 12/14/2015  . 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 Factor VIII deficiency ([place])   . GERD (gastroesophageal reflux disease)   . Hemorrhoids   . History of kidney stones   . HTN (hypertension)   . Hx of blood clots   . Hypercholesterolemia   . Kidney infarction [facility])   . PFO (patent foramen ovale)   . Secondary erythrocytosis   . Secundum ASD   . Stroke ([place]) 01/2015  . TIA (transient ischemic attack)    as per 07/30/00 note from [place]   . Vitamin D deficiency    Past Surgical History:  Procedure Laterality Date  .  APPENDECTOMY  1966  . BREAST BIOPSY Right 1991  . CARDIAC ELECTROPHYSIOLOGY STUDY AND ABLATION  2002  . CATARACT EXTRACTION W/ INTRAOCULAR LENS  IMPLANT, BILATERAL Bilateral   . CESAREAN SECTION  1981  . fibroid tumor removal  1996  . RADIOLOGY WITH ANESTHESIA N/A 02/16/2015   Procedure: RADIOLOGY WITH ANESTHESIA;  Surgeon: Medication Radiologist, MD;  Location: [place] [facility]URO ORS;  Service: Radiology;  Laterality: N/A;  . TEE WITHOUT CARDIOVERSION N/A 03/08/2015   Procedure: TRANSESOPHAGEAL ECHOCARDIOGRAM (TEE);  Surgeon: [name], MD;  Location: [place];  Service: Cardiovascular;  Laterality: N/A;  . TEE WITHOUT CARDIOVERSION N/A 04/28/2015   Procedure: TRANSESOPHAGEAL ECHOCARDIOGRAM (TEE);  Surgeon: [name], MD;  Location: [facility] ENDOSCOPY;  Service: Cardiovascular;  Laterality: N/A;   Family History  Problem Relation Age of Onset  . Stroke Maternal Grandmother   . Alcohol abuse Father   . Lung cancer Father   . Other Brother        Emotional Illness  . Lung cancer Brother   . Colon cancer Neg Hx    Social History   Tobacco Use  . Smoking status: Never Smoker  . Smokeless tobacco: Never Used  Vaping Use  . Vaping Use: Never used  Substance Use Topics  . Alcohol use: No    Alcohol/week: 0.0 standard drinks  . Drug  use: No    Pertinent Clinical Results:  LABS: Labs reviewed: Acceptable for surgery.  No visits with results within 3 Day(s) from this visit.  Latest known visit with results is:  Hospital Outpatient Visit on 03/26/2020  Component Date Value Ref Range Status  . Sodium 03/26/2020 138  135 - 145 mmol/L Final  . Potassium 03/26/2020 4.6  3.5 - 5.1 mmol/L Final  . Chloride 03/26/2020 104  98 - 111 mmol/L Final  . CO2 03/26/2020 25  22 - 32 mmol/L Final  . Glucose, Bld 03/26/2020 94  70 - 99 mg/dL Final   Glucose reference range applies only to samples taken after fasting for at least 8 hours.  . BUN 03/26/2020 14  8 - 23 mg/dL Final  . Creatinine,  Ser 03/26/2020 0.84  0.44 - 1.00 mg/dL Final  . Calcium 03/26/2020 9.0  8.9 - 10.3 mg/dL Final  . GFR, Estimated 03/26/2020 >60  >60 mL/min Final   Comment: (NOTE) Calculated using the CKD-EPI Creatinine Equation (2021)   . Anion gap 03/26/2020 9  5 - 15 Final   Performed at [facility], [address] [place], [place] [address]  . WBC 03/26/2020 6.8  4.0 - 10.5 K/uL Final  . RBC 03/26/2020 4.55  3.87 - 5.11 MIL/uL Final  . Hemoglobin 03/26/2020 14.4  12.0 - 15.0 g/dL Final  . HCT 03/26/2020 41.0  36.0 - 46.0 % Final  . MCV 03/26/2020 90.1  80.0 - 100.0 fL Final  . MCH 03/26/2020 31.6  26.0 - 34.0 pg Final  . MCHC 03/26/2020 35.1  30.0 - 36.0 g/dL Final  . RDW 03/26/2020 12.2  11.5 - 15.5 % Final  . Platelets 03/26/2020 212  150 - 400 K/uL Final  . nRBC 03/26/2020 0.0  0.0 - 0.2 % Final   Performed at [facility], [address] [place], [place] [address]    ECG: Date: 03/05/2020 Time ECG obtained: 0759 AM Rate: 66 bpm Rhythm: normal sinus Axis (leads I and aVF): Normal Intervals: PR 128 ms. QRS 88 ms. QTc 448 ms. ST segment and T wave changes: No evidence of acute ST segment elevation or depression Comparison: Similar to previous tracing obtained on 01/21/2019   IMAGING / PROCEDURES: DIAGNOSTIC RADIOGRAPH RIGHT FOOT performed on 04/24/2019 1. AP, oblique, and lateral of the right foot is negative for acute fracture 2. Hallux valgus deformity is noted with increased intermetatarsal hallux valgus angles 3. Hammertoe contracture of the lesser toes noted as well 4. Midtarsal and subtalar joints are well-maintained  BILATERAL CAROTID DOPPLER performed on 02/09/2016 1. There were no plaque seen bilaterally 2. Blood flow velocities were within normal limits 3. Normal antegrade blood flow seen in the bilateral vertebral arteries 4. Negative for hemodynamically significant stenosis involving the extracranial carotid arteries 5. Normal carotid duplex  study  TRANSESOPHAGEAL ECHOCARDIOGRAM performed on 04/28/2015 1. Left ventricle:   Systolic function was normal.   The estimated ejection fraction was in the range of 60% to 65%.  Wall motion was normal; there were no regional wall motion abnormalities. 2. Aortic valve:  No evidence of vegetation.  There was no regurgitation.  3. Mitral valve:   No evidence of vegetation.   There was mild regurgitation.  4. Left atrium:   Left atrial appendage is large with significantly decreasedfilling and emptying velocities.   There is significant smoke and a small thrombus in the tip of the appendage measuring 11 x 5 mm.  When compared to the prior  TEE on 03/08/2015 LAA thrombus is now smaller measuring 11 x 5 mm, previously 22 x 12 mm  The atrium was moderately dilated. 5. Right ventricle:   The cavity size was normal.   Wall thickness was normal.   Systolic function was normal.  6. Right atrium:   No evidence of thrombus in the atrial cavity or appendage.   No evidence of thrombus in the atrial cavity or appendage.  7. Atrial septum:   There is a small septum secundum type ASD with eft to right flow.  8. Tricuspid valve:   There was mild regurgitation. 9. Pulmonic valve:   Structurally normal valve.  10. Pulmonary arteries:   The main pulmonary artery was normal-sized.  11. Pericardium (extracardiac):  There was no pericardial effusion.   Impression and Plan:  Kendra [name] has been referred for pre-anesthesia review and clearance prior to her undergoing the planned anesthetic and procedural courses. Available labs, pertinent testing, and imaging results were personally reviewed by me. This patient has been appropriately cleared by cardiology with an overall ACCEPTABLE risk for significant perioperative cardiovascular complications.   Based on clinical review performed today (03/30/20), barring any significant acute changes in the patient's overall condition, it is  anticipated that she will be able to proceed with the planned surgical intervention. Any acute changes in clinical condition may necessitate her procedure being postponed and/or cancelled. Pre-surgical instructions were reviewed with the patient during her PAT appointment and questions were fielded by PAT clinical staff.  Honor Loh, MSN, APRN, FNP-C, CEN [facility]  Peri-operative Services Nurse Practitioner Phone: [phone] 03/30/20 11:06 AM  NOTE: This note has been prepared using Dragon dictation software. Despite my best ability to proofread, there is always the potential that unintentional transcriptional errors may still occur from this process.

## 2020-03-31 ENCOUNTER — Encounter: Payer: Self-pay | Admitting: Internal Medicine

## 2020-03-31 ENCOUNTER — Other Ambulatory Visit: Payer: Self-pay

## 2020-03-31 ENCOUNTER — Other Ambulatory Visit
Admission: RE | Admit: 2020-03-31 | Discharge: 2020-03-31 | Disposition: A | Discharge status: Home / Self Care | Payer: Medicare Other | Source: Ambulatory Visit | Attending: Podiatry | Admitting: Podiatry

## 2020-03-31 DIAGNOSIS — U071 COVID-19: Secondary | ICD-10-CM | POA: Insufficient documentation

## 2020-03-31 DIAGNOSIS — Z01812 Encounter for preprocedural laboratory examination: Secondary | ICD-10-CM | POA: Diagnosis not present

## 2020-03-31 LAB — SARS CORONAVIRUS 2 (TAT 6-24 HRS): SARS Coronavirus 2: POSITIVE — AB

## 2020-04-01 ENCOUNTER — Telehealth: Payer: Self-pay

## 2020-04-01 ENCOUNTER — Telehealth: Payer: Self-pay | Admitting: *Deleted

## 2020-04-01 NOTE — Telephone Encounter (Signed)
Patient called to report she tested positive for COVID but has not symptoms and her procedure would have to rescheduled. She states the reschedule date is for 04/16/2020. Advised that she will continue Lovenox daily and resume Warfarin today as the Lovenox will keep her protected while the INR comes up but with the next procedure coming up we on 2/18, then she may be on Lovenox longer as the INR will need to increase. Advised that the she could continue Lovenox daily or try to come off by resuming today to try to get INR up. She asked if I could speak with her husband and he wanted to know if this has ever happened with someone having to use Lovenox for a long time, advised that it has for various reasons. He states he is concerned that the pt may have to be on Lovenox for a long time, advised that if the surgery is not urgent he could see if it could be postponed by the foot doctor so she would not have to continue Lovenox for a long period of time. Spoke back with the pt and advised that if she gets her surgery dates rescheduled changed to let us know.  Also, she will take Warfarin 1 tab tonight, 1.5 tabs tomorrow then resume normal and continue Lovenox daily until recheck on Tuesday.

## 2020-04-01 NOTE — Telephone Encounter (Signed)
Called to discuss with patient about COVID-19 symptoms and the use of one of the available treatments for those with mild to moderate Covid symptoms and at a high risk of hospitalization.  Pt appears to qualify for outpatient treatment due to co-morbid conditions and/or a member of an at-risk group in accordance with the FDA Emergency Use Authorization.    Symptom onset: None per chart Vaccinated: Yes Booster? No Immunocompromised? No Qualifiers: Yes  Unable to reach pt - Unable to leave message, mailbox full.  Kendra [name]

## 2020-04-05 ENCOUNTER — Ambulatory Visit: Payer: Medicare Other | Admitting: Internal Medicine

## 2020-04-06 ENCOUNTER — Ambulatory Visit (INDEPENDENT_AMBULATORY_CARE_PROVIDER_SITE_OTHER): Payer: Medicare Other | Admitting: Pharmacist

## 2020-04-06 DIAGNOSIS — I6350 Cerebral infarction due to unspecified occlusion or stenosis of unspecified cerebral artery: Secondary | ICD-10-CM | POA: Diagnosis not present

## 2020-04-06 DIAGNOSIS — Z5181 Encounter for therapeutic drug level monitoring: Secondary | ICD-10-CM | POA: Diagnosis not present

## 2020-04-06 DIAGNOSIS — I480 Paroxysmal atrial fibrillation: Secondary | ICD-10-CM

## 2020-04-06 DIAGNOSIS — Q211 Atrial septal defect: Secondary | ICD-10-CM | POA: Diagnosis not present

## 2020-04-06 DIAGNOSIS — Q2112 Patent foramen ovale: Secondary | ICD-10-CM

## 2020-04-06 LAB — POCT INR: INR: 1.8 — AB (ref 2.0–3.0)

## 2020-04-06 NOTE — Patient Instructions (Signed)
Description   Spoke with patient  and recommend she take 5 mg today (typically takes 2.5mg ) and 7.5mg  tomorrow and then continue current schedule. Is still on Lovenox and advised her to continue.  Current schedule: Warfarin 5mg  daily except 2.5mg  on Tuesdays, Thursdays, and Saturdays. Please follow Bridge instructions. Recheck INR on 2/11. Call [facility] # [phone] with any changes in medications, if scheduled for any procedures or have any questions.

## 2020-04-07 NOTE — Pre-Procedure Instructions (Signed)
Called patient to touch base. Surgery rescheduled for 04/16/20. She has her previous instructions. She is aware to follow those as she would have prior to surgery being cancelled. She is not on any new medications. She will follow her previous instructions regarding her blood thinner.

## 2020-04-09 ENCOUNTER — Ambulatory Visit (INDEPENDENT_AMBULATORY_CARE_PROVIDER_SITE_OTHER): Payer: Medicare Other

## 2020-04-09 DIAGNOSIS — I6350 Cerebral infarction due to unspecified occlusion or stenosis of unspecified cerebral artery: Secondary | ICD-10-CM

## 2020-04-09 DIAGNOSIS — I480 Paroxysmal atrial fibrillation: Secondary | ICD-10-CM

## 2020-04-09 DIAGNOSIS — Z5181 Encounter for therapeutic drug level monitoring: Secondary | ICD-10-CM | POA: Diagnosis not present

## 2020-04-09 DIAGNOSIS — Q2112 Patent foramen ovale: Secondary | ICD-10-CM

## 2020-04-09 DIAGNOSIS — Q211 Atrial septal defect: Secondary | ICD-10-CM

## 2020-04-09 LAB — POCT INR: INR: 2.7 (ref 2.0–3.0)

## 2020-04-09 NOTE — Patient Instructions (Signed)
Description   Spoke with patient advised to continue on same dosage of Warfarin 5mg  daily except 2.5mg  on Tuesdays, Thursdays, and Saturdays. Please follow Bridge instructions prior to procedure on 04/16/20, last dosage of Warfarin on 03/10/20, start Lovenox bridge on 04/12/20. Recheck INR on 04/22/20. Pt has Lovenox bridging instructions from previously scheduled procedure that was cancelled due to Covid.  Call Coumadin Clinic # [phone] with any changes in medications, if scheduled for any procedures or have any questions.

## 2020-04-15 MED ORDER — FAMOTIDINE 20 MG PO TABS: 20.0000 mg | ORAL_TABLET | Freq: Once | ORAL | Status: AC

## 2020-04-15 MED ORDER — CEFAZOLIN SODIUM-DEXTROSE 2-4 GM/100ML-% IV SOLN
2.0000 g | INTRAVENOUS | Status: AC
  Administered 2020-04-16: 2 g via INTRAVENOUS

## 2020-04-15 MED ORDER — CHLORHEXIDINE GLUCONATE 0.12 % MT SOLN: 15.0000 mL | Freq: Once | OROMUCOSAL | Status: AC

## 2020-04-15 MED ORDER — LACTATED RINGERS IV SOLN: INTRAVENOUS | Status: DC

## 2020-04-15 MED ORDER — ORAL CARE MOUTH RINSE: 15.0000 mL | Freq: Once | OROMUCOSAL | Status: AC

## 2020-04-16 ENCOUNTER — Ambulatory Visit: Payer: Medicare Other | Admitting: Urgent Care

## 2020-04-16 ENCOUNTER — Ambulatory Visit
Admission: RE | Admit: 2020-04-16 | Discharge: 2020-04-16 | Disposition: A | Discharge status: Home / Self Care | Payer: Medicare Other | Attending: Podiatry | Admitting: Podiatry

## 2020-04-16 ENCOUNTER — Ambulatory Visit: Payer: Medicare Other

## 2020-04-16 ENCOUNTER — Encounter: Payer: Self-pay | Admitting: Podiatry

## 2020-04-16 ENCOUNTER — Encounter
Admission: RE | Disposition: A | Discharge status: Home / Self Care | Payer: Self-pay | Source: Home / Self Care | Attending: Podiatry

## 2020-04-16 DIAGNOSIS — Z79899 Other long term (current) drug therapy: Secondary | ICD-10-CM | POA: Insufficient documentation

## 2020-04-16 DIAGNOSIS — Z7983 Long term (current) use of bisphosphonates: Secondary | ICD-10-CM | POA: Diagnosis not present

## 2020-04-16 DIAGNOSIS — K219 Gastro-esophageal reflux disease without esophagitis: Secondary | ICD-10-CM | POA: Diagnosis not present

## 2020-04-16 DIAGNOSIS — Z888 Allergy status to other drugs, medicaments and biological substances status: Secondary | ICD-10-CM | POA: Diagnosis not present

## 2020-04-16 DIAGNOSIS — I10 Essential (primary) hypertension: Secondary | ICD-10-CM | POA: Diagnosis not present

## 2020-04-16 DIAGNOSIS — Z7982 Long term (current) use of aspirin: Secondary | ICD-10-CM | POA: Diagnosis not present

## 2020-04-16 DIAGNOSIS — Z7901 Long term (current) use of anticoagulants: Secondary | ICD-10-CM | POA: Diagnosis not present

## 2020-04-16 DIAGNOSIS — E7800 Pure hypercholesterolemia, unspecified: Secondary | ICD-10-CM | POA: Diagnosis not present

## 2020-04-16 DIAGNOSIS — M2011 Hallux valgus (acquired), right foot: Secondary | ICD-10-CM | POA: Diagnosis not present

## 2020-04-16 HISTORY — DX: Atrial septal defect: Q21.1

## 2020-04-16 HISTORY — DX: Vitamin D deficiency, unspecified: E55.9

## 2020-04-16 HISTORY — PX: HALLUX VALGUS LAPIDUS: SHX6626

## 2020-04-16 HISTORY — DX: Secondary polycythemia: D75.1

## 2020-04-16 HISTORY — DX: Deficiency of other specified B group vitamins: E53.8

## 2020-04-16 HISTORY — DX: Essential (primary) hypertension: I10

## 2020-04-16 HISTORY — DX: Age-related osteoporosis without current pathological fracture: M81.0

## 2020-04-16 HISTORY — DX: Secundum atrial septal defect: Q21.11

## 2020-04-16 HISTORY — DX: Patent foramen ovale: Q21.12

## 2020-04-16 LAB — PROTIME-INR
INR: 1.1 (ref 0.8–1.2)
Prothrombin Time: 13.3 seconds (ref 11.4–15.2)

## 2020-04-16 SURGERY — BUNIONECTOMY, LAPIDUS
Anesthesia: General | Laterality: Right

## 2020-04-16 MED ORDER — FAMOTIDINE 20 MG PO TABS
ORAL_TABLET | ORAL | Status: AC
  Administered 2020-04-16: 20 mg via ORAL
  Filled 2020-04-16: qty 1

## 2020-04-16 MED ORDER — PROPOFOL 500 MG/50ML IV EMUL
INTRAVENOUS | Status: AC
  Filled 2020-04-16: qty 50

## 2020-04-16 MED ORDER — FENTANYL CITRATE (PF) 100 MCG/2ML IJ SOLN: 25.0000 ug | INTRAMUSCULAR | Status: DC | PRN

## 2020-04-16 MED ORDER — LIDOCAINE HCL (CARDIAC) PF 100 MG/5ML IV SOSY
PREFILLED_SYRINGE | INTRAVENOUS | Status: DC | PRN
  Administered 2020-04-16: 100 mg via INTRAVENOUS

## 2020-04-16 MED ORDER — DEXAMETHASONE SODIUM PHOSPHATE 10 MG/ML IJ SOLN
INTRAMUSCULAR | Status: AC
  Filled 2020-04-16: qty 1

## 2020-04-16 MED ORDER — LIDOCAINE HCL (PF) 2 % IJ SOLN
INTRAMUSCULAR | Status: AC
  Filled 2020-04-16: qty 5

## 2020-04-16 MED ORDER — CHLORHEXIDINE GLUCONATE 0.12 % MT SOLN
OROMUCOSAL | Status: AC
  Administered 2020-04-16: 15 mL via OROMUCOSAL
  Filled 2020-04-16: qty 15

## 2020-04-16 MED ORDER — BUPIVACAINE-MELOXICAM ER 200-6 MG/7ML IJ SOLN
INTRAMUSCULAR | Status: DC | PRN
  Administered 2020-04-16: 200 mg

## 2020-04-16 MED ORDER — ONDANSETRON HCL 4 MG/2ML IJ SOLN: 4.0000 mg | Freq: Once | INTRAMUSCULAR | Status: DC | PRN

## 2020-04-16 MED ORDER — EPHEDRINE SULFATE 50 MG/ML IJ SOLN
INTRAMUSCULAR | Status: DC | PRN
  Administered 2020-04-16: 10 mg via INTRAVENOUS
  Administered 2020-04-16 (×2): 5 mg via INTRAVENOUS
  Administered 2020-04-16: 10 mg via INTRAVENOUS
  Administered 2020-04-16 (×2): 5 mg via INTRAVENOUS

## 2020-04-16 MED ORDER — BUPIVACAINE-EPINEPHRINE (PF) 0.25% -1:200000 IJ SOLN
INTRAMUSCULAR | Status: DC | PRN
  Administered 2020-04-16: 27 mL

## 2020-04-16 MED ORDER — HYDROCODONE-ACETAMINOPHEN 5-325 MG PO TABS: 1.0000 | ORAL_TABLET | Freq: Four times a day (QID) | ORAL | 0 refills | Status: DC | PRN

## 2020-04-16 MED ORDER — ONDANSETRON HCL 4 MG/2ML IJ SOLN
INTRAMUSCULAR | Status: AC
  Filled 2020-04-16: qty 2

## 2020-04-16 MED ORDER — BUPIVACAINE-MELOXICAM ER 200-6 MG/7ML IJ SOLN
INTRAMUSCULAR | Status: AC
  Filled 2020-04-16: qty 1

## 2020-04-16 MED ORDER — PROPOFOL 10 MG/ML IV BOLUS
INTRAVENOUS | Status: DC | PRN
  Administered 2020-04-16: 30 mg via INTRAVENOUS
  Administered 2020-04-16: 150 mg via INTRAVENOUS

## 2020-04-16 MED ORDER — ONDANSETRON HCL 4 MG/2ML IJ SOLN
INTRAMUSCULAR | Status: DC | PRN
  Administered 2020-04-16: 4 mg via INTRAVENOUS

## 2020-04-16 MED ORDER — DEXAMETHASONE SODIUM PHOSPHATE 10 MG/ML IJ SOLN
INTRAMUSCULAR | Status: DC | PRN
  Administered 2020-04-16: 5 mg via INTRAVENOUS

## 2020-04-16 MED ORDER — CEFAZOLIN SODIUM-DEXTROSE 2-4 GM/100ML-% IV SOLN
INTRAVENOUS | Status: AC
  Filled 2020-04-16: qty 100

## 2020-04-16 SURGICAL SUPPLY — 53 items
BLADE MED AGGRESSIVE (BLADE) ×2 IMPLANT
BLADE SAW LAPIPLASTY 40X11 (BLADE) ×2 IMPLANT
BLADE SURG 15 STRL LF DISP TIS (BLADE) ×2 IMPLANT
BLADE SURG 15 STRL SS (BLADE) ×4
BNDG CMPR STD VLCR NS LF 5.8X4 (GAUZE/BANDAGES/DRESSINGS) ×2
BNDG CONFORM 2 STRL LF (GAUZE/BANDAGES/DRESSINGS) ×2 IMPLANT
BNDG CONFORM 3 STRL LF (GAUZE/BANDAGES/DRESSINGS) ×2 IMPLANT
BNDG ELASTIC 4X5.8 VLCR NS LF (GAUZE/BANDAGES/DRESSINGS) ×4 IMPLANT
BNDG GAUZE 4.5X4.1 6PLY STRL (MISCELLANEOUS) ×2 IMPLANT
BOOT STEPPER DURA MED (SOFTGOODS) ×2 IMPLANT
BUR 4X45 EGG (BURR) ×2 IMPLANT
CANISTER SUCT 1200ML W/VALVE (MISCELLANEOUS) ×2 IMPLANT
CONTROL 360 (Bone Implant) ×2 IMPLANT
COVER PIN YLW 0.028-062 (MISCELLANEOUS) ×2 IMPLANT
COVER WAND RF STERILE (DRAPES) ×2 IMPLANT
CUFF TOURN SGL QUICK 12 (TOURNIQUET CUFF) IMPLANT
CUFF TOURN SGL QUICK 18X4 (TOURNIQUET CUFF) IMPLANT
DRAPE FLUOR MINI C-ARM 54X84 (DRAPES) ×2 IMPLANT
DURAPREP 26ML APPLICATOR (WOUND CARE) ×2 IMPLANT
ELECT REM PT RETURN 9FT ADLT (ELECTROSURGICAL) ×2
ELECTRODE REM PT RTRN 9FT ADLT (ELECTROSURGICAL) ×1 IMPLANT
GAUZE SPONGE 4X4 12PLY STRL (GAUZE/BANDAGES/DRESSINGS) ×2 IMPLANT
GAUZE XEROFORM 1X8 LF (GAUZE/BANDAGES/DRESSINGS) ×2 IMPLANT
GLOVE INDICATOR 8.0 STRL GRN (GLOVE) ×2 IMPLANT
GLOVE SURG ENC MOIS LTX SZ7.5 (GLOVE) ×2 IMPLANT
GOWN STRL REUS W/ TWL XL LVL3 (GOWN DISPOSABLE) ×2 IMPLANT
GOWN STRL REUS W/TWL XL LVL3 (GOWN DISPOSABLE) ×4
LABEL OR SOLS (LABEL) ×2 IMPLANT
MANIFOLD NEPTUNE II (INSTRUMENTS) ×2 IMPLANT
NEEDLE FILTER BLUNT 18X 1/2SAF (NEEDLE) ×1
NEEDLE FILTER BLUNT 18X1 1/2 (NEEDLE) ×1 IMPLANT
NEEDLE HYPO 22GX1.5 SAFETY (NEEDLE) ×2 IMPLANT
NEEDLE HYPO 25X1 1.5 SAFETY (NEEDLE) ×4 IMPLANT
NS IRRIG 500ML POUR BTL (IV SOLUTION) ×2 IMPLANT
PACK EXTREMITY ARMC (MISCELLANEOUS) ×2 IMPLANT
PENCIL ELECTRO HAND CTR (MISCELLANEOUS) ×2 IMPLANT
RASP SM TEAR CROSS CUT (RASP) ×2 IMPLANT
SPLINT CAST 1 STEP 4X30 (MISCELLANEOUS) ×2 IMPLANT
SPLINT FAST PLASTER 5X30 (CAST SUPPLIES) ×1
SPLINT PLASTER CAST FAST 5X30 (CAST SUPPLIES) ×1 IMPLANT
STOCKINETTE M/LG 89821 (MISCELLANEOUS) ×2 IMPLANT
STRAP SAFETY 5IN WIDE (MISCELLANEOUS) ×2 IMPLANT
STRIP CLOSURE SKIN 1/4X4 (GAUZE/BANDAGES/DRESSINGS) ×2 IMPLANT
SUT ETHILON 2 0 FS 18 (SUTURE) ×2 IMPLANT
SUT MNCRL 4-0 (SUTURE) ×2
SUT MNCRL 4-0 27XMFL (SUTURE) ×1
SUT VIC AB 3-0 SH 27 (SUTURE) ×2
SUT VIC AB 3-0 SH 27X BRD (SUTURE) ×1 IMPLANT
SUT VIC AB 4-0 FS2 27 (SUTURE) ×4 IMPLANT
SUTURE MNCRL 4-0 27XMF (SUTURE) ×1 IMPLANT
SYR 10ML LL (SYRINGE) ×2 IMPLANT
WIRE Z .045 C-WIRE SPADE TIP (WIRE) ×4 IMPLANT
WIRE Z .062 C-WIRE SPADE TIP (WIRE) ×4 IMPLANT

## 2020-04-16 NOTE — Anesthesia Preprocedure Evaluation (Signed)
Anesthesia Evaluation  Patient identified by MRN, date of birth, ID band Patient awake    Reviewed: Allergy & Precautions, NPO status , Patient's Chart, lab work & pertinent test results  History of Anesthesia Complications Negative for: history of anesthetic complications  Airway Mallampati: III       Dental   Pulmonary neg sleep apnea, neg COPD, Not current smoker,           Cardiovascular hypertension, Pt. on medications (-) Past MI and (-) CHF (-) dysrhythmias (-) Valvular Problems/Murmurs     Neuro/Psych neg Seizures CVA (r sided weakness, speech difficulties, resolved), No Residual Symptoms    GI/Hepatic Neg liver ROS, GERD  Medicated,  Endo/Other  neg diabetes  Renal/GU negative Renal ROS     Musculoskeletal   Abdominal   Peds  Hematology   Anesthesia Other Findings   Reproductive/Obstetrics                             Anesthesia Physical Anesthesia Plan  ASA: III  Anesthesia Plan: General   Post-op Pain Management:    Induction: Intravenous  PONV Risk Score and Plan: 3 and Ondansetron and Dexamethasone  Airway Management Planned: LMA  Additional Equipment:   Intra-op Plan:   Post-operative Plan:   Informed Consent: I have reviewed the patients History and Physical, chart, labs and discussed the procedure including the risks, benefits and alternatives for the proposed anesthesia with the patient or authorized representative who has indicated his/her understanding and acceptance.       Plan Discussed with:   Anesthesia Plan Comments:         Anesthesia Quick Evaluation

## 2020-04-16 NOTE — Op Note (Signed)
Operative note   Surgeon:Dayonna Selbe Lawyer: None    Preop diagnosis: Hallux valgus deformity right foot    Postop diagnosis: Same    Procedure: Lapidus hallux valgus deformity correction right foot    EBL: Minimal    Anesthesia:local and general    Hemostasis: Epinephrine infiltrated along the incision site    Specimen: None    Complications: None    Operative indications:Adison TAMBRIA PFANNENSTIEL is an 75 y.o. that presents today for surgical intervention.  The risks/benefits/alternatives/complications have been discussed and consent has been given.    Procedure:  Patient was brought into the OR and placed on the operating table in thesupine position. After anesthesia was obtained theright lower extremity was prepped and draped in usual sterile fashion.  Attention was directed to the dorsal first met cuneiform and incision was performed along the extensor tendon.  Sharp and blunt dissection was carried down to the periosteum.  The first met cuneiform joint was then released of all attachments.  Good mobility was noted.  A dorsomedial incision was performed over the first MTPJ.  The intermetatarsal space was entered.  The DTI L was transected.  The conjoined tendon of the abductor was released.  More flexibility at the first MTPJ was then noted.  At this time attention was redirected to the first metatarsocuneiform joint.  The next the joint positioner was placed and the first metatarsal was realigned into an anatomic aligned position.  This was parallel to the second metatarsal.  The cut guide was then placed into the first met cuneiform joint into cuts were placed converging and realign the first met cuneiforms joint.  All the articular cartilage was removed down to the subchondral bone.  The joint was then prepared with a 2.0 mm drill bit.  Compression was then placed across the first met cuneiform joint with a compression device dorsally and compression of all of wire.  A final  stabilizing K wire was used at this time.  At this time the medial locking plate was placed initially.  Next the dorsal locking plate was placed.  Good stability and realignment was noted in all planes.  Attention was redirected to the first MTPJ where a capsulotomy was performed.  The prominent dorsomedial eminence was transected and smoothed with a power saw and rasp.  The wound was then flushed with copious amounts of irrigation.  A small capsulorrhaphy was performed medially.  The deeper structures were then infiltrated with zynrelef topical anesthetic.  After closure of the capsule the deeper tissues were then once again infiltrated with a topical anesthetic and closure with a 4-0 Vicryl was performed.  The skin was then reapproximated with a 4-0 Monocryl.  A bulky sterile dressing was applied to the right foot.  Patient was placed in an equalizer walker boot for nonweightbearing.  Prescription for Norco was sent to her pharmacy.    Patient tolerated the procedure and anesthesia well.  Was transported from the OR to the PACU with all vital signs stable and vascular status intact. To be discharged per routine protocol.  Will follow up in approximately 1 week in the outpatient clinic.

## 2020-04-16 NOTE — Anesthesia Procedure Notes (Signed)
Procedure Name: LMA Insertion Date/Time: 04/16/2020 10:02 AM Performed by: Zetta Bills, CRNA Pre-anesthesia Checklist: Patient identified, Emergency Drugs available, Suction available and Patient being monitored Patient Re-evaluated:Patient Re-evaluated prior to induction Oxygen Delivery Method: Circle system utilized Induction Type: IV induction Ventilation: Mask ventilation without difficulty LMA: LMA inserted LMA Size: 3.5 Tube type: Oral Number of attempts: 2 Placement Confirmation: positive ETCO2 Tube secured with: Tape Dental Injury: Teeth and Oropharynx as per pre-operative assessment

## 2020-04-16 NOTE — Discharge Instructions (Signed)
       AMBULATORY SURGERY  DISCHARGE INSTRUCTIONS   1) The drugs that you were given will stay in your system until tomorrow so for the next 24 hours you should not:  A) Drive an automobile B) Make any legal decisions C) Drink any alcoholic beverage   2) You may resume regular meals tomorrow.  Today it is better to start with liquids and gradually work up to solid foods.  You may eat anything you prefer, but it is better to start with liquids, then soup and crackers, and gradually work up to solid foods.   3) Please notify your doctor immediately if you have any unusual bleeding, trouble breathing, redness and pain at the surgery site, drainage, fever, or pain not relieved by medication.    4) Additional Instructions:        Please contact your physician with any problems or Same Day Surgery at [phone], Monday through Friday 6 am to 4 pm, or [place] [facility] [place] Main number at [phone].  [facility]  POST OPERATIVE INSTRUCTIONS FOR DR. TROXLER, DR. FOWLER, AND DR. [facility] PODIATRY DEPARTMENT   1. Take your medication as prescribed.  Pain medication should be taken only as needed.  2. Keep the dressing clean, dry and intact.  3. Keep your foot elevated above the heart level for the first 48 hours.  4. Walking to the bathroom and brief periods of walking are acceptable, unless we have instructed you to be non-weight bearing.  5. Always wear your post-op shoe when walking.  Always use your crutches if you are to be non-weight bearing.  6. Do not take a shower. Baths are permissible as long as the foot is kept out of the water.   7. Every hour you are awake:  - Bend your knee 15 times. - Flex foot 15 times - Massage calf 15 times  8. Call [facility] ([phone]) if any of the following problems occur: - You develop a temperature or fever. - The bandage becomes saturated with  blood. - Medication does not stop your pain. - Injury of the foot occurs. - Any symptoms of infection including redness, odor, or red streaks running from wound.  

## 2020-04-16 NOTE — Transfer of Care (Signed)
Immediate Anesthesia Transfer of Care Note  Patient: Kendra [name]  Procedure(s) Performed: HALLUX VALGUS LAPIDUS (Right )  Patient Location: PACU  Anesthesia Type:General  Level of Consciousness: awake  Airway & Oxygen Therapy: Patient Spontanous Breathing  Post-op Assessment: Report given to RN  Post vital signs: stable  Last Vitals:  Vitals Value Taken Time  BP 134/63 04/16/20 1215  Temp    Pulse 73 04/16/20 1217  Resp 21 04/16/20 1217  SpO2 99 % 04/16/20 1217  Vitals shown include unvalidated device data.  Last Pain:  Vitals:   04/16/20 0912  PainSc: 0-No pain         Complications: No complications documented.

## 2020-04-19 ENCOUNTER — Encounter: Payer: Self-pay | Admitting: Podiatry

## 2020-04-19 NOTE — Anesthesia Postprocedure Evaluation (Signed)
Anesthesia Post Note  Patient: Kendra [name]  Procedure(s) Performed: HALLUX VALGUS LAPIDUS (Right )  Patient location during evaluation: PACU Anesthesia Type: General Level of consciousness: awake and alert Pain management: pain level controlled Vital Signs Assessment: post-procedure vital signs reviewed and stable Respiratory status: spontaneous breathing and respiratory function stable Cardiovascular status: stable Anesthetic complications: no   No complications documented.   Last Vitals:  Vitals:   04/16/20 1302 04/16/20 1329  BP: 137/63 123/69  Pulse: 73 72  Resp: 18   Temp: (!) 36.3 C   SpO2: 100% 100%    Last Pain:  Vitals:   04/16/20 1329  TempSrc:   PainSc: 0-No pain                 [name] K

## 2020-04-20 ENCOUNTER — Encounter: Payer: Self-pay | Admitting: Podiatry

## 2020-04-22 ENCOUNTER — Ambulatory Visit (INDEPENDENT_AMBULATORY_CARE_PROVIDER_SITE_OTHER): Payer: Medicare Other | Admitting: *Deleted

## 2020-04-22 DIAGNOSIS — I6350 Cerebral infarction due to unspecified occlusion or stenosis of unspecified cerebral artery: Secondary | ICD-10-CM

## 2020-04-22 DIAGNOSIS — Q211 Atrial septal defect: Secondary | ICD-10-CM

## 2020-04-22 DIAGNOSIS — Q2112 Patent foramen ovale: Secondary | ICD-10-CM

## 2020-04-22 DIAGNOSIS — M2011 Hallux valgus (acquired), right foot: Secondary | ICD-10-CM | POA: Diagnosis not present

## 2020-04-22 DIAGNOSIS — Z5181 Encounter for therapeutic drug level monitoring: Secondary | ICD-10-CM

## 2020-04-22 DIAGNOSIS — I480 Paroxysmal atrial fibrillation: Secondary | ICD-10-CM

## 2020-04-22 LAB — POCT INR: INR: 1.6 — AB (ref 2.0–3.0)

## 2020-04-22 MED ORDER — ENOXAPARIN SODIUM 100 MG/ML ~~LOC~~ SOLN: 100.0000 mg | SUBCUTANEOUS | 1 refills | Status: DC

## 2020-04-22 NOTE — Patient Instructions (Signed)
Description   Spoke with patient advised to continue Lovenox injections and take 7.5mg  of Warfarin today and 7.5mg  of Warfarin tomorrow then continue taking Warfarin 5mg  daily except 2.5mg  on Tuesdays, Thursdays, and Saturdays. Recheck INR on 04/27/20. Call [facility] # [phone] with any changes in medications, if scheduled for any procedures or have any questions.

## 2020-04-27 ENCOUNTER — Ambulatory Visit (INDEPENDENT_AMBULATORY_CARE_PROVIDER_SITE_OTHER): Payer: Medicare Other | Admitting: *Deleted

## 2020-04-27 DIAGNOSIS — L814 Other melanin hyperpigmentation: Secondary | ICD-10-CM | POA: Diagnosis not present

## 2020-04-27 DIAGNOSIS — Z5181 Encounter for therapeutic drug level monitoring: Secondary | ICD-10-CM

## 2020-04-27 DIAGNOSIS — D225 Melanocytic nevi of trunk: Secondary | ICD-10-CM | POA: Diagnosis not present

## 2020-04-27 DIAGNOSIS — I6350 Cerebral infarction due to unspecified occlusion or stenosis of unspecified cerebral artery: Secondary | ICD-10-CM

## 2020-04-27 DIAGNOSIS — Q2112 Patent foramen ovale: Secondary | ICD-10-CM

## 2020-04-27 DIAGNOSIS — L821 Other seborrheic keratosis: Secondary | ICD-10-CM | POA: Diagnosis not present

## 2020-04-27 DIAGNOSIS — I480 Paroxysmal atrial fibrillation: Secondary | ICD-10-CM

## 2020-04-27 DIAGNOSIS — D485 Neoplasm of uncertain behavior of skin: Secondary | ICD-10-CM | POA: Diagnosis not present

## 2020-04-27 DIAGNOSIS — Q211 Atrial septal defect: Secondary | ICD-10-CM

## 2020-04-27 DIAGNOSIS — D0439 Carcinoma in situ of skin of other parts of face: Secondary | ICD-10-CM | POA: Diagnosis not present

## 2020-04-27 DIAGNOSIS — Z7901 Long term (current) use of anticoagulants: Secondary | ICD-10-CM | POA: Diagnosis not present

## 2020-04-27 DIAGNOSIS — L578 Other skin changes due to chronic exposure to nonionizing radiation: Secondary | ICD-10-CM | POA: Diagnosis not present

## 2020-04-27 LAB — POCT INR: INR: 2.6 (ref 2.0–3.0)

## 2020-04-27 NOTE — Patient Instructions (Signed)
Description   Spoke with patient advised to stop Lovenox injections and take 5mg  of Warfarin today then continue taking Warfarin 5mg  daily except 2.5mg  on Tuesdays, Thursdays, and Saturdays. Recheck INR in 2 weeks. Call [facility] # [phone] with any changes in medications, if scheduled for any procedures or have any questions.

## 2020-05-03 ENCOUNTER — Ambulatory Visit (INDEPENDENT_AMBULATORY_CARE_PROVIDER_SITE_OTHER): Payer: Medicare Other | Admitting: Internal Medicine

## 2020-05-03 ENCOUNTER — Other Ambulatory Visit: Payer: Self-pay

## 2020-05-03 ENCOUNTER — Encounter: Payer: Self-pay | Admitting: Internal Medicine

## 2020-05-03 VITALS — BP 110/62 | HR 69 | Temp 97.5°F | Resp 15 | Ht 68.0 in | Wt 150.0 lb

## 2020-05-03 DIAGNOSIS — E7800 Pure hypercholesterolemia, unspecified: Secondary | ICD-10-CM

## 2020-05-03 DIAGNOSIS — M810 Age-related osteoporosis without current pathological fracture: Secondary | ICD-10-CM | POA: Diagnosis not present

## 2020-05-03 DIAGNOSIS — E782 Mixed hyperlipidemia: Secondary | ICD-10-CM | POA: Diagnosis not present

## 2020-05-03 DIAGNOSIS — I10 Essential (primary) hypertension: Secondary | ICD-10-CM

## 2020-05-03 DIAGNOSIS — R5383 Other fatigue: Secondary | ICD-10-CM | POA: Diagnosis not present

## 2020-05-03 DIAGNOSIS — D66 Hereditary factor VIII deficiency: Secondary | ICD-10-CM | POA: Diagnosis not present

## 2020-05-03 DIAGNOSIS — E559 Vitamin D deficiency, unspecified: Secondary | ICD-10-CM | POA: Diagnosis not present

## 2020-05-03 DIAGNOSIS — N280 Ischemia and infarction of kidney: Secondary | ICD-10-CM | POA: Diagnosis not present

## 2020-05-03 DIAGNOSIS — I6350 Cerebral infarction due to unspecified occlusion or stenosis of unspecified cerebral artery: Secondary | ICD-10-CM | POA: Diagnosis not present

## 2020-05-03 DIAGNOSIS — R0600 Dyspnea, unspecified: Secondary | ICD-10-CM

## 2020-05-03 DIAGNOSIS — T733XXA Exhaustion due to excessive exertion, initial encounter: Secondary | ICD-10-CM

## 2020-05-03 DIAGNOSIS — R0609 Other forms of dyspnea: Secondary | ICD-10-CM

## 2020-05-03 HISTORY — DX: Ischemia and infarction of kidney: N28.0

## 2020-05-03 LAB — COMPREHENSIVE METABOLIC PANEL
ALT: 18 U/L (ref 0–35)
AST: 16 U/L (ref 0–37)
Albumin: 4.2 g/dL (ref 3.5–5.2)
Alkaline Phosphatase: 90 U/L (ref 39–117)
BUN: 17 mg/dL (ref 6–23)
CO2: 27 mEq/L (ref 19–32)
Calcium: 9.5 mg/dL (ref 8.4–10.5)
Chloride: 101 mEq/L (ref 96–112)
Creatinine, Ser: 0.95 mg/dL (ref 0.40–1.20)
GFR: 58.4 mL/min — ABNORMAL LOW (ref >60.00)
Glucose, Bld: 96 mg/dL (ref 70–99)
Potassium: 4.8 mEq/L (ref 3.5–5.1)
Sodium: 136 mEq/L (ref 135–145)
Total Bilirubin: 0.8 mg/dL (ref 0.2–1.2)
Total Protein: 7.2 g/dL (ref 6.0–8.3)

## 2020-05-03 LAB — TSH: TSH: 1.7 u[IU]/mL (ref 0.35–4.50)

## 2020-05-03 LAB — LIPID PANEL
Cholesterol: 171 mg/dL (ref 0–200)
HDL: 59.4 mg/dL (ref >39.00)
LDL Cholesterol: 84 mg/dL (ref 0–99)
NonHDL: 111.67
Total CHOL/HDL Ratio: 3
Triglycerides: 140 mg/dL (ref 0.0–149.0)
VLDL: 28 mg/dL (ref 0.0–40.0)

## 2020-05-03 LAB — VITAMIN D 25 HYDROXY (VIT D DEFICIENCY, FRACTURES): VITD: 41.66 ng/mL (ref 30.00–100.00)

## 2020-05-03 LAB — BRAIN NATRIURETIC PEPTIDE: Pro B Natriuretic peptide (BNP): 97 pg/mL (ref 0.0–100.0)

## 2020-05-03 NOTE — Patient Instructions (Signed)
I  WILL ORDER YOUR  BI ANNUAL  BONE DENSITY TEST   Health Maintenance After Age 76 After age 38, you are at a higher risk for certain long-term diseases and infections as well as injuries from falls. Falls are a major cause of broken bones and head injuries in people who are older than age 44. Getting regular preventive care can help to keep you healthy and well. Preventive care includes getting regular testing and making lifestyle changes as recommended by your health care provider. Talk with your health care provider about:  Which screenings and tests you should have. A screening is a test that checks for a disease when you have no symptoms.  A diet and exercise plan that is right for you. What should I know about screenings and tests to prevent falls? Screening and testing are the best ways to find a health problem early. Early diagnosis and treatment give you the best chance of managing medical conditions that are common after age 16. Certain conditions and lifestyle choices may make you more likely to have a fall. Your health care provider may recommend:  Regular vision checks. Poor vision and conditions such as cataracts can make you more likely to have a fall. If you wear glasses, make sure to get your prescription updated if your vision changes.  Medicine review. Work with your health care provider to regularly review all of the medicines you are taking, including over-the-counter medicines. Ask your health care provider about any side effects that may make you more likely to have a fall. Tell your health care provider if any medicines that you take make you feel dizzy or sleepy.  Osteoporosis screening. Osteoporosis is a condition that causes the bones to get weaker. This can make the bones weak and cause them to break more easily.  Blood pressure screening. Blood pressure changes and medicines to control blood pressure can make you feel dizzy.  Strength and balance checks. Your health  care provider may recommend certain tests to check your strength and balance while standing, walking, or changing positions.  Foot health exam. Foot pain and numbness, as well as not wearing proper footwear, can make you more likely to have a fall.  Depression screening. You may be more likely to have a fall if you have a fear of falling, feel emotionally low, or feel unable to do activities that you used to do.  Alcohol use screening. Using too much alcohol can affect your balance and may make you more likely to have a fall. What actions can I take to lower my risk of falls? General instructions  Talk with your health care provider about your risks for falling. Tell your health care provider if: ? You fall. Be sure to tell your health care provider about all falls, even ones that seem minor. ? You feel dizzy, sleepy, or off-balance.  Take over-the-counter and prescription medicines only as told by your health care provider. These include any supplements.  Eat a healthy diet and maintain a healthy weight. A healthy diet includes low-fat dairy products, low-fat (lean) meats, and fiber from whole grains, beans, and lots of fruits and vegetables. Home safety  Remove any tripping hazards, such as rugs, cords, and clutter.  Install safety equipment such as grab bars in bathrooms and safety rails on stairs.  Keep rooms and walkways well-lit. Activity  Follow a regular exercise program to stay fit. This will help you maintain your balance. Ask your health care provider what types  of exercise are appropriate for you.  If you need a cane or walker, use it as recommended by your health care provider.  Wear supportive shoes that have nonskid soles.   Lifestyle  Do not drink alcohol if your health care provider tells you not to drink.  If you drink alcohol, limit how much you have: ? 0-1 drink a day for women. ? 0-2 drinks a day for men.  Be aware of how much alcohol is in your drink. In  the [place] one drink equals one typical bottle of beer (12 oz), one-half glass of wine (5 oz), or one shot of hard liquor (1 oz).  Do not use any products that contain nicotine or tobacco, such as cigarettes and e-cigarettes. If you need help quitting, ask your health care provider. Summary  Having a healthy lifestyle and getting preventive care can help to protect your health and wellness after age 69.  Screening and testing are the best way to find a health problem early and help you avoid having a fall. Early diagnosis and treatment give you the best chance for managing medical conditions that are more common for people who are older than age 50.  Falls are a major cause of broken bones and head injuries in people who are older than age 38. Take precautions to prevent a fall at home.  Work with your health care provider to learn what changes you can make to improve your health and wellness and to prevent falls. This information is not intended to replace advice given to you by your health care provider. Make sure you discuss any questions you have with your health care provider. Document Revised: 06/06/2018 Document Reviewed: 12/27/2016 Elsevier Patient Education  2021 [employer].

## 2020-05-03 NOTE — Progress Notes (Signed)
Patient ID: Kendra [name], female    DOB: 1945-10-14  Age: 75 y.o. MRN: [id number]  The patient is here for annual follow up and management of other chronic and acute problems.  Accompanied by husband Clair Gulling    The risk factors are reflected in the social history.  There are no preventive care reminders to display for this patient.  The roster of all physicians providing medical care to patient - is listed in the Snapshot section of the chart.  Activities of daily living:  The patient is 100% independent in all ADLs: dressing, toileting, feeding as well as independent mobility  Home safety : The patient has smoke detectors in the home. They wear seatbelts.  There are no firearms at home. There is no violence in the home.   There is no risks for hepatitis, STDs or HIV. There is no   history of blood transfusion. They have no travel history to infectious disease endemic areas of the world.  The patient has seen their dentist in the last six month. They have seen their eye doctor in the last year. She denies  hearing difficulty with regard to whispered voices and some television programs.  They have deferred audiologic testing in the last year.  They do not  have excessive sun exposure. Discussed the need for sun protection: hats, long sleeves and use of sunscreen if there is significant sun exposure.   Diet: the importance of a healthy diet is discussed. They do have a healthy diet.  The benefits of regular aerobic exercise were discussed. She walks 4 times per week ,  20 minutes.   Depression screen: there are no signs or vegative symptoms of depression- irritability, change in appetite, anhedonia, sadness/tearfullness.  Cognitive assessment: the patient manages all their financial and personal affairs and is actively engaged. They could relate day,date,year and events; recalled 2/3 objects at 3 minutes; performed clock-face test normally.  The following portions of the patient's history were  reviewed and updated as appropriate: allergies, current medications, past family history, past medical history,  past surgical history, past social history  and problem list.  Visual acuity was not assessed per patient preference since she has regular follow up with her ophthalmologist. Hearing and body mass index were assessed and reviewed.   During the course of the visit the patient was educated and counseled about appropriate screening and preventive services including : fall prevention , diabetes screening, nutrition counseling, colorectal cancer screening, and recommended immunizations.    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   1) decreased stamina since bunion surgery 2 weeks ago .  Used to hike 3  [place] daily.  Denies dyspnea.  Likes one pillow to sleep .  No weight gain.Marland Kitchen  No chest pain or othoe[;ned EF 60 to 65% by TEE in  2017   (1 yr after her embolic stroke )   2) Osteopenia:  Reviewed intake of calcium and Vitamin  D:  takes 1000 Ius daily  Of Vit D.  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   She has a past surgical history that includes Cesarean section (1981); fibroid tumor removal (1996); Cardiac electrophysiology study and ablation (2002); Radiology with anesthesia (N/A, 02/16/2015); TEE without cardioversion (N/A, 03/08/2015); TEE without cardioversion (N/A, 04/28/2015); Breast biopsy (Right, 1991); Appendectomy (1966); Cataract extraction w/ intraocular lens  implant, bilateral (Bilateral); and Hallux valgus lapidus (Right, 04/16/2020).   Her family history includes Alcohol abuse in her father; Lung cancer in her brother and father; Other in her brother; Stroke in her maternal grandmother.She reports that she has never smoked. She has never used smokeless tobacco. She reports that she does not drink alcohol and does not use drugs.  Outpatient Medications Prior to Visit  Medication Sig Dispense Refill  . alendronate (FOSAMAX) 70 MG tablet TAKE 1 TABLET EVERY 7 DAYS WITH A FULL GLASS WATER ON AN EMPTY STOMACH (Patient taking differently: Take 70 mg by mouth every Tuesday.) 12 tablet 2  . aspirin 81 MG tablet Take 81 mg by mouth daily.    . cholecalciferol (VITAMIN D) 1000 units tablet Take 1,000 Units by mouth daily.    . furosemide (LASIX) 20 MG tablet TAKE 1 TABLET BY MOUTH EVERY DAY 90 tablet 3  . losartan (COZAAR) 25 MG tablet Take 25 mg by mouth daily.    . Magnesium 100 MG TABS Take 100 mg by mouth every other day.    . metoprolol succinate (TOPROL-XL) 100 MG 24 hr tablet TAKE 1 TABLET EVERY DAY WITH OR AFTER A MEAL (Patient taking differently: Take 100 mg by mouth daily.) 90 tablet 2  . Multiple Vitamins-Minerals (MULTIVITAMIN WITH MINERALS) tablet Take 1 tablet by mouth daily.    . rosuvastatin (CRESTOR) 5 MG tablet TAKE 1 TABLET BY MOUTH EVERY DAY (Patient taking differently: Take 5 mg by mouth daily.) 90 tablet 3  . warfarin (COUMADIN) 5 MG tablet TAKE 1/2 TO 1 TABLET BY MOUTH DAILY AS DIRECTED BY THE  COUMADIN CLINIC (Patient taking differently: Take 2.5-5 mg by mouth See admin instructions. Take 1/2 to 1 tablet by mouth daily as directed by the coumadin clinic; Tues Thurs and Sat 2.5 mg, all other days is 5 mg) 90 tablet 1  . Zinc 30 MG TABS Take 1 tablet by mouth every other day.    . enoxaparin (LOVENOX) 100 MG/ML injection Inject 1 mL (100 mg total) into the skin daily. (Patient not taking: Reported on 05/03/2020) 10 mL 1  . HYDROcodone-acetaminophen (NORCO) 5-325 MG tablet Take 1-2 tablets by mouth every 6 (six) hours as needed for moderate pain. Max 6 tabs per day (Patient not taking: Reported on 05/03/2020) 30 tablet 0   No facility-administered medications prior to visit.    Review of Systems   Patient denies headache, fevers, malaise, unintentional weight loss, skin rash, eye pain, sinus congestion and sinus pain, sore throat, dysphagia,  hemoptysis , cough, dyspnea, wheezing, chest pain, palpitations, orthopnea, edema, abdominal pain, nausea, melena, diarrhea, constipation, flank pain, dysuria, hematuria, urinary  Frequency, nocturia, numbness, tingling, seizures,  Focal weakness, Loss of consciousness,  Tremor, insomnia, depression, anxiety, and suicidal ideation.      Objective:  BP 110/62 (BP Location: Left Arm, Patient Position: Sitting, Cuff Size: Normal)   Pulse 69   Temp (!) 97.5 F (36.4  C) (Oral)   Resp 15   Ht 5\' 8"  (1.727 m)   Wt 150 lb (68 kg)   SpO2 98%   BMI 22.81 kg/m   Physical Exam  General appearance: alert, cooperative and appears stated age Ears: normal TM's and external ear canals both ears Throat: lips, mucosa, and tongue normal; teeth and gums normal Neck: no adenopathy, no carotid bruit, supple, symmetrical, trachea midline and thyroid not enlarged, symmetric, no tenderness/mass/nodules Back: symmetric, no curvature. ROM normal. No CVA tenderness. Lungs: clear to auscultation bilaterally Heart: regular rate and rhythm, S1, S2 normal, no murmur,  click, rub or gallop Abdomen: soft, non-tender; bowel sounds normal; no masses,  no organomegaly Pulses: 2+ and symmetric Skin: Skin color, texture, turgor normal. No rashes or lesions Lymph nodes: Cervical, supraclavicular, and axillary nodes normal.  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 Renal function stable, no changes today.  Lab Results  Component Value Date   CREATININE 0.95 05/03/2020   Lab Results  Component Value Date   NA 136 05/03/2020   K 4.8 05/03/2020   CL 101 05/03/2020   CO2 27 05/03/2020         Fatigue    Deconditioning suspected due to bunion surgery ,  But will begin Screening for CHF with BNP .  Has not had ECHO in over 3 years.  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  REpeat DEXA due      Relevant Orders   DG Bone Density   PURE HYPERCHOLESTEROLEMIA    Managed with statin therapy.  LDL < 100.  LFTS normal   No changes today to statin   Lab Results  Component Value Date   CHOL 171 05/03/2020   HDL 59.40 05/03/2020   LDLCALC 84 05/03/2020   LDLDIRECT 117.0 03/08/2016   TRIG 140.0 05/03/2020   CHOLHDL 3 05/03/2020   Lab Results  Component Value Date   ALT 18 05/03/2020   AST 16 05/03/2020   ALKPHOS 90 05/03/2020   BILITOT 0.8 05/03/2020         Vitamin D deficiency   Relevant Orders   VITAMIN D 25 Hydroxy (Vit-D Deficiency, Fractures) (Completed)    Other Visit Diagnoses    Moderate mixed hyperlipidemia not requiring statin therapy       Relevant Orders   Lipid panel (Completed)   Dyspnea on exertion       Relevant Orders   Brain natriuretic peptide (Completed)     I provided  30  minutes of  face-to-face time during this encounter reviewing patient's current problems and past surgeries, labs and imaging studies, providing counseling on the above mentioned problems , and coordination  of care . I have discontinued Tyteanna M. Yakubov's HYDROcodone-acetaminophen and enoxaparin. I am also having her maintain her aspirin, cholecalciferol, Zinc, rosuvastatin, Magnesium, alendronate, warfarin, metoprolol succinate, losartan, multivitamin with minerals, and furosemide.  No orders of the defined types were placed in this encounter.   Medications Discontinued During This Encounter  Medication Reason  . enoxaparin (LOVENOX) 100 MG/ML injection   . HYDROcodone-acetaminophen (NORCO) 5-325 MG tablet  Follow-up: Return in about 1 year (around 05/03/2021).   Crecencio Mc, MD

## 2020-05-04 DIAGNOSIS — R5383 Other fatigue: Secondary | ICD-10-CM | POA: Insufficient documentation

## 2020-05-04 DIAGNOSIS — M2011 Hallux valgus (acquired), right foot: Secondary | ICD-10-CM | POA: Diagnosis not present

## 2020-05-04 NOTE — Assessment & Plan Note (Signed)
Deconditioning suspected due to bunion surgery ,  But will begin Screening for CHF with BNP .  Has not had ECHO in over 3 years.  d

## 2020-05-04 NOTE — Progress Notes (Signed)
Your CBC, vitamin D, thyroid ,cholesterol,  liver and kidney function are normal.  You are not anemic or prediabetic    Please Plan to repeat fasting labs in 1 year.   Regards,  Dr. Derrel Nip

## 2020-05-04 NOTE — Assessment & Plan Note (Signed)
Managed with statin therapy.  LDL < 100.  LFTS normal   No changes today to statin   Lab Results  Component Value Date   CHOL 171 05/03/2020   HDL 59.40 05/03/2020   LDLCALC 84 05/03/2020   LDLDIRECT 117.0 03/08/2016   TRIG 140.0 05/03/2020   CHOLHDL 3 05/03/2020   Lab Results  Component Value Date   ALT 18 05/03/2020   AST 16 05/03/2020   ALKPHOS 90 05/03/2020   BILITOT 0.8 05/03/2020

## 2020-05-04 NOTE — Assessment & Plan Note (Addendum)
Well controlled on current regimen of metoprolol 100 mg daily and losartan . Renal function stable, no changes today.  Lab Results  Component Value Date   CREATININE 0.95 05/03/2020   Lab Results  Component Value Date   NA 136 05/03/2020   K 4.8 05/03/2020   CL 101 05/03/2020   CO2 27 05/03/2020

## 2020-05-04 NOTE — Assessment & Plan Note (Signed)
Managed with alendronate since 2018.  REpeat DEXA due

## 2020-05-11 ENCOUNTER — Encounter: Payer: Self-pay | Admitting: Internal Medicine

## 2020-05-11 ENCOUNTER — Ambulatory Visit (INDEPENDENT_AMBULATORY_CARE_PROVIDER_SITE_OTHER): Payer: Medicare Other | Admitting: Pharmacist

## 2020-05-11 DIAGNOSIS — Q2112 Patent foramen ovale: Secondary | ICD-10-CM

## 2020-05-11 DIAGNOSIS — I480 Paroxysmal atrial fibrillation: Secondary | ICD-10-CM | POA: Diagnosis not present

## 2020-05-11 DIAGNOSIS — Q211 Atrial septal defect: Secondary | ICD-10-CM | POA: Diagnosis not present

## 2020-05-11 DIAGNOSIS — I6350 Cerebral infarction due to unspecified occlusion or stenosis of unspecified cerebral artery: Secondary | ICD-10-CM

## 2020-05-11 DIAGNOSIS — Z5181 Encounter for therapeutic drug level monitoring: Secondary | ICD-10-CM

## 2020-05-11 LAB — POCT INR: INR: 2.4 (ref 2.0–3.0)

## 2020-05-17 ENCOUNTER — Telehealth: Payer: Self-pay

## 2020-05-17 DIAGNOSIS — M810 Age-related osteoporosis without current pathological fracture: Secondary | ICD-10-CM

## 2020-05-17 NOTE — Telephone Encounter (Signed)
New order has been placed. 

## 2020-05-17 NOTE — Telephone Encounter (Signed)
Good afternoon!  Pt would like her bone density to be done at [facility] new order needed please and Thank you!

## 2020-05-17 NOTE — Telephone Encounter (Signed)
Pt called and states that she gets her bone density test done at [facility] in [place] and would like the referral sent there.

## 2020-05-18 NOTE — Telephone Encounter (Signed)
noted 

## 2020-05-18 NOTE — Telephone Encounter (Signed)
Ok, Thanks you, Order faxed.

## 2020-05-22 ENCOUNTER — Other Ambulatory Visit: Payer: Self-pay | Admitting: Internal Medicine

## 2020-05-25 ENCOUNTER — Ambulatory Visit (INDEPENDENT_AMBULATORY_CARE_PROVIDER_SITE_OTHER): Payer: Medicare Other

## 2020-05-25 DIAGNOSIS — Q211 Atrial septal defect: Secondary | ICD-10-CM

## 2020-05-25 DIAGNOSIS — I480 Paroxysmal atrial fibrillation: Secondary | ICD-10-CM | POA: Diagnosis not present

## 2020-05-25 DIAGNOSIS — Z5181 Encounter for therapeutic drug level monitoring: Secondary | ICD-10-CM | POA: Diagnosis not present

## 2020-05-25 DIAGNOSIS — I6350 Cerebral infarction due to unspecified occlusion or stenosis of unspecified cerebral artery: Secondary | ICD-10-CM

## 2020-05-25 DIAGNOSIS — Q2112 Patent foramen ovale: Secondary | ICD-10-CM

## 2020-05-25 LAB — POCT INR: INR: 3 (ref 2.0–3.0)

## 2020-05-25 NOTE — Patient Instructions (Signed)
Description   Spoke with patient advised to continue taking Warfarin 5mg  daily except 2.5mg  on Tuesdays, Thursdays, and Saturdays. Recheck INR in 2 weeks. Call [facility] Clinic # [phone] with any changes in medications, if scheduled for any procedures or have any questions.

## 2020-06-02 DIAGNOSIS — M8589 Other specified disorders of bone density and structure, multiple sites: Secondary | ICD-10-CM | POA: Diagnosis not present

## 2020-06-03 DIAGNOSIS — M2011 Hallux valgus (acquired), right foot: Secondary | ICD-10-CM | POA: Diagnosis not present

## 2020-06-03 LAB — HM DEXA SCAN

## 2020-06-08 ENCOUNTER — Encounter: Payer: Self-pay | Admitting: Pharmacist

## 2020-06-08 ENCOUNTER — Ambulatory Visit (INDEPENDENT_AMBULATORY_CARE_PROVIDER_SITE_OTHER): Payer: Medicare Other | Admitting: Pharmacist

## 2020-06-08 DIAGNOSIS — Q211 Atrial septal defect: Secondary | ICD-10-CM

## 2020-06-08 DIAGNOSIS — I6350 Cerebral infarction due to unspecified occlusion or stenosis of unspecified cerebral artery: Secondary | ICD-10-CM | POA: Diagnosis not present

## 2020-06-08 DIAGNOSIS — Q2112 Patent foramen ovale: Secondary | ICD-10-CM

## 2020-06-08 DIAGNOSIS — Z5181 Encounter for therapeutic drug level monitoring: Secondary | ICD-10-CM

## 2020-06-08 DIAGNOSIS — I480 Paroxysmal atrial fibrillation: Secondary | ICD-10-CM | POA: Diagnosis not present

## 2020-06-08 LAB — POCT INR: INR: 2.8 (ref 2.0–3.0)

## 2020-06-08 NOTE — Progress Notes (Signed)
This encounter was created in error - please disregard.

## 2020-06-15 ENCOUNTER — Telehealth: Payer: Self-pay | Admitting: Internal Medicine

## 2020-06-15 ENCOUNTER — Encounter: Payer: Self-pay | Admitting: Internal Medicine

## 2020-06-15 DIAGNOSIS — M810 Age-related osteoporosis without current pathological fracture: Secondary | ICD-10-CM

## 2020-06-15 NOTE — Telephone Encounter (Signed)
t scores are improving on most recent DEXA with regard to left hip and in spine.  Right hip stable   Continue alendronate , calcium vitamin  d and walking

## 2020-06-15 NOTE — Telephone Encounter (Signed)
Patient was returning call about results

## 2020-06-15 NOTE — Telephone Encounter (Signed)
Spoke with pt and informed her of her dexa scan results. Pt gave a verbal understanding.

## 2020-06-15 NOTE — Telephone Encounter (Signed)
LMTCB

## 2020-06-15 NOTE — Assessment & Plan Note (Signed)
Managed with alendronate since  2018.  SS increase in density of spine and left hip is noted on   Repeat DEXA April 2022

## 2020-06-19 ENCOUNTER — Other Ambulatory Visit: Payer: Self-pay | Admitting: Internal Medicine

## 2020-06-22 ENCOUNTER — Other Ambulatory Visit: Payer: Self-pay | Admitting: Internal Medicine

## 2020-06-22 ENCOUNTER — Ambulatory Visit (INDEPENDENT_AMBULATORY_CARE_PROVIDER_SITE_OTHER): Payer: Medicare Other | Admitting: *Deleted

## 2020-06-22 DIAGNOSIS — Q2112 Patent foramen ovale: Secondary | ICD-10-CM

## 2020-06-22 DIAGNOSIS — Q211 Atrial septal defect: Secondary | ICD-10-CM | POA: Diagnosis not present

## 2020-06-22 DIAGNOSIS — I480 Paroxysmal atrial fibrillation: Secondary | ICD-10-CM

## 2020-06-22 DIAGNOSIS — Z5181 Encounter for therapeutic drug level monitoring: Secondary | ICD-10-CM

## 2020-06-22 DIAGNOSIS — Z7901 Long term (current) use of anticoagulants: Secondary | ICD-10-CM | POA: Diagnosis not present

## 2020-06-22 LAB — POCT INR: INR: 2.6 (ref 2.0–3.0)

## 2020-06-23 ENCOUNTER — Telehealth: Payer: Self-pay | Admitting: Internal Medicine

## 2020-07-06 ENCOUNTER — Ambulatory Visit (INDEPENDENT_AMBULATORY_CARE_PROVIDER_SITE_OTHER): Payer: Medicare Other | Admitting: Pharmacist

## 2020-07-06 DIAGNOSIS — Z5181 Encounter for therapeutic drug level monitoring: Secondary | ICD-10-CM | POA: Diagnosis not present

## 2020-07-06 DIAGNOSIS — Q2112 Patent foramen ovale: Secondary | ICD-10-CM

## 2020-07-06 DIAGNOSIS — Q211 Atrial septal defect: Secondary | ICD-10-CM

## 2020-07-06 DIAGNOSIS — I480 Paroxysmal atrial fibrillation: Secondary | ICD-10-CM | POA: Diagnosis not present

## 2020-07-06 LAB — POCT INR: INR: 3 (ref 2.0–3.0)

## 2020-07-07 DIAGNOSIS — Z23 Encounter for immunization: Secondary | ICD-10-CM | POA: Diagnosis not present

## 2020-07-20 ENCOUNTER — Ambulatory Visit (INDEPENDENT_AMBULATORY_CARE_PROVIDER_SITE_OTHER): Payer: Medicare Other | Admitting: Interventional Cardiology

## 2020-07-20 DIAGNOSIS — I480 Paroxysmal atrial fibrillation: Secondary | ICD-10-CM

## 2020-07-20 DIAGNOSIS — Q211 Atrial septal defect: Secondary | ICD-10-CM | POA: Diagnosis not present

## 2020-07-20 DIAGNOSIS — Q2112 Patent foramen ovale: Secondary | ICD-10-CM

## 2020-07-20 DIAGNOSIS — Z5181 Encounter for therapeutic drug level monitoring: Secondary | ICD-10-CM

## 2020-07-20 LAB — POCT INR: INR: 2.9 (ref 2.0–3.0)

## 2020-07-20 NOTE — Patient Instructions (Signed)
Description   Spoke with patient advised to continue taking Warfarin 5mg  daily except 2.5mg  on Tuesdays, Thursdays, and Saturdays. Recheck INR in 2 weeks. Call [facility] Clinic # [phone] with any changes in medications, if scheduled for any procedures or have any questions.

## 2020-07-27 DIAGNOSIS — M2011 Hallux valgus (acquired), right foot: Secondary | ICD-10-CM | POA: Diagnosis not present

## 2020-08-03 ENCOUNTER — Ambulatory Visit (INDEPENDENT_AMBULATORY_CARE_PROVIDER_SITE_OTHER): Payer: Medicare Other | Admitting: *Deleted

## 2020-08-03 DIAGNOSIS — Z5181 Encounter for therapeutic drug level monitoring: Secondary | ICD-10-CM | POA: Diagnosis not present

## 2020-08-03 DIAGNOSIS — Q211 Atrial septal defect: Secondary | ICD-10-CM | POA: Diagnosis not present

## 2020-08-03 DIAGNOSIS — I480 Paroxysmal atrial fibrillation: Secondary | ICD-10-CM

## 2020-08-03 DIAGNOSIS — Q2112 Patent foramen ovale: Secondary | ICD-10-CM

## 2020-08-03 LAB — POCT INR: INR: 2.9 (ref 2.0–3.0)

## 2020-08-03 NOTE — Patient Instructions (Signed)
Description   Spoke with patient advised to continue taking Warfarin 5mg  daily except 2.5mg  on Tuesdays, Thursdays, and Saturdays. Recheck INR in 2 weeks. Call [facility] # [phone] with any changes in medications, if scheduled for any procedures or have any questions.

## 2020-08-17 ENCOUNTER — Ambulatory Visit (INDEPENDENT_AMBULATORY_CARE_PROVIDER_SITE_OTHER): Payer: Medicare Other

## 2020-08-17 DIAGNOSIS — I480 Paroxysmal atrial fibrillation: Secondary | ICD-10-CM | POA: Diagnosis not present

## 2020-08-17 DIAGNOSIS — Q2112 Patent foramen ovale: Secondary | ICD-10-CM

## 2020-08-17 DIAGNOSIS — Z5181 Encounter for therapeutic drug level monitoring: Secondary | ICD-10-CM | POA: Diagnosis not present

## 2020-08-17 DIAGNOSIS — Q211 Atrial septal defect: Secondary | ICD-10-CM

## 2020-08-17 DIAGNOSIS — Z7901 Long term (current) use of anticoagulants: Secondary | ICD-10-CM | POA: Diagnosis not present

## 2020-08-17 LAB — POCT INR: INR: 3.4 — AB (ref 2.0–3.0)

## 2020-08-17 NOTE — Patient Instructions (Signed)
Description   Spoke with patient advised to continue taking Warfarin 5mg  daily except 2.5mg  on Tuesdays, Thursdays, and Saturdays. Recheck INR in 2 weeks. Call [facility] # [phone] with any changes in medications, if scheduled for any procedures or have any questions.

## 2020-08-23 DIAGNOSIS — Z20822 Contact with and (suspected) exposure to covid-19: Secondary | ICD-10-CM | POA: Diagnosis not present

## 2020-08-23 DIAGNOSIS — Z6823 Body mass index (BMI) 23.0-23.9, adult: Secondary | ICD-10-CM | POA: Diagnosis not present

## 2020-08-23 DIAGNOSIS — J0100 Acute maxillary sinusitis, unspecified: Secondary | ICD-10-CM | POA: Diagnosis not present

## 2020-08-27 ENCOUNTER — Ambulatory Visit (INDEPENDENT_AMBULATORY_CARE_PROVIDER_SITE_OTHER): Payer: Medicare Other

## 2020-08-27 DIAGNOSIS — Z5181 Encounter for therapeutic drug level monitoring: Secondary | ICD-10-CM

## 2020-08-27 DIAGNOSIS — Q211 Atrial septal defect: Secondary | ICD-10-CM

## 2020-08-27 DIAGNOSIS — Q2112 Patent foramen ovale: Secondary | ICD-10-CM

## 2020-08-27 DIAGNOSIS — I480 Paroxysmal atrial fibrillation: Secondary | ICD-10-CM | POA: Diagnosis not present

## 2020-08-27 LAB — POCT INR: INR: 3.3 — AB (ref 2.0–3.0)

## 2020-08-27 NOTE — Patient Instructions (Addendum)
    Description   Spoke with patient advised to continue taking Warfarin 5mg  daily except 2.5mg  on Tuesdays, Thursdays, and Saturdays. Pt is scheduled for recheck on 08/31/20, pt usually rechecks every 2 weeks.  Will keep scheduled recheck in effort to stay consistent. . Call Coumadin Clinic # [phone] with any changes in medications, if scheduled for any procedures or have any questions.

## 2020-08-31 ENCOUNTER — Ambulatory Visit (INDEPENDENT_AMBULATORY_CARE_PROVIDER_SITE_OTHER): Payer: Medicare Other | Admitting: Interventional Cardiology

## 2020-08-31 ENCOUNTER — Other Ambulatory Visit: Payer: Self-pay | Admitting: Internal Medicine

## 2020-08-31 DIAGNOSIS — Z5181 Encounter for therapeutic drug level monitoring: Secondary | ICD-10-CM | POA: Diagnosis not present

## 2020-08-31 LAB — POCT INR: INR: 3.8 — AB (ref 2.0–3.0)

## 2020-08-31 NOTE — Patient Instructions (Addendum)
Description   Spoke with patient advised to hold warfarin today and then continue taking Warfarin 5mg  daily except 2.5mg  on Tuesdays, Thursdays, and Saturdays. Recheck INR in 2 weeks. Call [facility] Clinic # [phone] with any changes in medications, if scheduled for any procedures or have any questions.

## 2020-09-14 ENCOUNTER — Ambulatory Visit (INDEPENDENT_AMBULATORY_CARE_PROVIDER_SITE_OTHER): Payer: Medicare Other | Admitting: Pharmacist

## 2020-09-14 DIAGNOSIS — Q2112 Patent foramen ovale: Secondary | ICD-10-CM

## 2020-09-14 DIAGNOSIS — Q211 Atrial septal defect: Secondary | ICD-10-CM

## 2020-09-14 DIAGNOSIS — Z5181 Encounter for therapeutic drug level monitoring: Secondary | ICD-10-CM

## 2020-09-14 DIAGNOSIS — I480 Paroxysmal atrial fibrillation: Secondary | ICD-10-CM

## 2020-09-14 LAB — POCT INR: INR: 2.4 (ref 2.0–3.0)

## 2020-09-28 ENCOUNTER — Ambulatory Visit (INDEPENDENT_AMBULATORY_CARE_PROVIDER_SITE_OTHER): Payer: Medicare Other | Admitting: *Deleted

## 2020-09-28 DIAGNOSIS — Z5181 Encounter for therapeutic drug level monitoring: Secondary | ICD-10-CM

## 2020-09-28 DIAGNOSIS — I480 Paroxysmal atrial fibrillation: Secondary | ICD-10-CM

## 2020-09-28 DIAGNOSIS — Q211 Atrial septal defect: Secondary | ICD-10-CM | POA: Diagnosis not present

## 2020-09-28 DIAGNOSIS — Z7901 Long term (current) use of anticoagulants: Secondary | ICD-10-CM | POA: Diagnosis not present

## 2020-09-28 DIAGNOSIS — Q2112 Patent foramen ovale: Secondary | ICD-10-CM

## 2020-09-28 LAB — POCT INR: INR: 3.9 — AB (ref 2.0–3.0)

## 2020-09-28 NOTE — Patient Instructions (Addendum)
Description   Spoke with patient advised to hold warfarin today's dose of warfarin and then continue taking Warfarin '5mg'$  daily except 2.'5mg'$  on Tuesdays, Thursdays, and Saturdays. Recheck INR in 2 weeks. Call [facility] # [phone] with any changes in medications, if scheduled for any procedures or have any questions.

## 2020-10-01 ENCOUNTER — Ambulatory Visit: Payer: Medicare Other

## 2020-10-05 ENCOUNTER — Other Ambulatory Visit: Payer: Self-pay | Admitting: Internal Medicine

## 2020-10-12 ENCOUNTER — Ambulatory Visit (INDEPENDENT_AMBULATORY_CARE_PROVIDER_SITE_OTHER): Payer: Medicare Other | Admitting: *Deleted

## 2020-10-12 DIAGNOSIS — Q211 Atrial septal defect: Secondary | ICD-10-CM | POA: Diagnosis not present

## 2020-10-12 DIAGNOSIS — Z5181 Encounter for therapeutic drug level monitoring: Secondary | ICD-10-CM

## 2020-10-12 DIAGNOSIS — Q2112 Patent foramen ovale: Secondary | ICD-10-CM

## 2020-10-12 DIAGNOSIS — I480 Paroxysmal atrial fibrillation: Secondary | ICD-10-CM

## 2020-10-12 LAB — POCT INR: INR: 3.1 — AB (ref 2.0–3.0)

## 2020-10-12 NOTE — Patient Instructions (Signed)
Description   Spoke with patient advised to continue taking Warfarin '5mg'$  daily except 2.'5mg'$  on Tuesdays, Thursdays, and Saturdays. Recheck INR in 2 weeks. Call [facility]madin Clinic # [phone] with any changes in medications, if scheduled for any procedures or have any questions.

## 2020-10-26 ENCOUNTER — Ambulatory Visit (INDEPENDENT_AMBULATORY_CARE_PROVIDER_SITE_OTHER): Payer: Medicare Other | Admitting: *Deleted

## 2020-10-26 DIAGNOSIS — Q2112 Patent foramen ovale: Secondary | ICD-10-CM

## 2020-10-26 DIAGNOSIS — I480 Paroxysmal atrial fibrillation: Secondary | ICD-10-CM | POA: Diagnosis not present

## 2020-10-26 DIAGNOSIS — Z5181 Encounter for therapeutic drug level monitoring: Secondary | ICD-10-CM

## 2020-10-26 DIAGNOSIS — Q211 Atrial septal defect: Secondary | ICD-10-CM

## 2020-10-26 LAB — POCT INR: INR: 3.7 — AB (ref 2.0–3.0)

## 2020-10-26 NOTE — Patient Instructions (Signed)
Description   Spoke with patient advised to hold today's dose of Warfarin then continue taking Warfarin '5mg'$  daily except 2.'5mg'$  on Tuesdays, Thursdays, and Saturdays. Recheck INR in 2 weeks. Call [facility]in Clinic # [phone] with any changes in medications, if scheduled for any procedures or have any questions.

## 2020-11-03 ENCOUNTER — Ambulatory Visit: Payer: Medicare Other

## 2020-11-04 DIAGNOSIS — Z20828 Contact with and (suspected) exposure to other viral communicable diseases: Secondary | ICD-10-CM | POA: Diagnosis not present

## 2020-11-04 DIAGNOSIS — E871 Hypo-osmolality and hyponatremia: Secondary | ICD-10-CM | POA: Diagnosis not present

## 2020-11-04 DIAGNOSIS — I517 Cardiomegaly: Secondary | ICD-10-CM | POA: Diagnosis not present

## 2020-11-04 DIAGNOSIS — R0602 Shortness of breath: Secondary | ICD-10-CM | POA: Diagnosis not present

## 2020-11-04 DIAGNOSIS — N390 Urinary tract infection, site not specified: Secondary | ICD-10-CM | POA: Diagnosis not present

## 2020-11-04 DIAGNOSIS — R5383 Other fatigue: Secondary | ICD-10-CM | POA: Diagnosis not present

## 2020-11-04 DIAGNOSIS — R9431 Abnormal electrocardiogram [ECG] [EKG]: Secondary | ICD-10-CM | POA: Diagnosis not present

## 2020-11-04 DIAGNOSIS — Z8673 Personal history of transient ischemic attack (TIA), and cerebral infarction without residual deficits: Secondary | ICD-10-CM | POA: Diagnosis not present

## 2020-11-04 DIAGNOSIS — I4892 Unspecified atrial flutter: Secondary | ICD-10-CM | POA: Diagnosis not present

## 2020-11-04 DIAGNOSIS — Z79899 Other long term (current) drug therapy: Secondary | ICD-10-CM | POA: Diagnosis not present

## 2020-11-04 DIAGNOSIS — R110 Nausea: Secondary | ICD-10-CM | POA: Diagnosis not present

## 2020-11-04 DIAGNOSIS — Z20822 Contact with and (suspected) exposure to covid-19: Secondary | ICD-10-CM | POA: Diagnosis not present

## 2020-11-04 DIAGNOSIS — R531 Weakness: Secondary | ICD-10-CM | POA: Diagnosis not present

## 2020-11-05 ENCOUNTER — Telehealth: Payer: Self-pay | Admitting: Internal Medicine

## 2020-11-05 DIAGNOSIS — R7401 Elevation of levels of liver transaminase levels: Secondary | ICD-10-CM

## 2020-11-05 DIAGNOSIS — E871 Hypo-osmolality and hyponatremia: Secondary | ICD-10-CM

## 2020-11-05 NOTE — Telephone Encounter (Signed)
Please call her and set up a lab appt for Monday .  The the labs are still abn we will set up a visit to follow

## 2020-11-05 NOTE — Telephone Encounter (Signed)
Spoken to patient. She is currently out of town and will not return until 22SEP22.  Have reordered labs to [facility] so patient cant get done at a [facility] facility.

## 2020-11-05 NOTE — Telephone Encounter (Signed)
Patient called in stating she went to [facility] last night for a uti and concerns about her sodium and magnesium levels and she would like to follow up with Dr.Tullo.No openings until 9/30, please advise.

## 2020-11-08 DIAGNOSIS — E871 Hypo-osmolality and hyponatremia: Secondary | ICD-10-CM | POA: Diagnosis not present

## 2020-11-09 ENCOUNTER — Ambulatory Visit (INDEPENDENT_AMBULATORY_CARE_PROVIDER_SITE_OTHER): Payer: Medicare Other | Admitting: *Deleted

## 2020-11-09 DIAGNOSIS — Q211 Atrial septal defect: Secondary | ICD-10-CM

## 2020-11-09 DIAGNOSIS — Z5181 Encounter for therapeutic drug level monitoring: Secondary | ICD-10-CM

## 2020-11-09 DIAGNOSIS — Q2112 Patent foramen ovale: Secondary | ICD-10-CM

## 2020-11-09 DIAGNOSIS — I480 Paroxysmal atrial fibrillation: Secondary | ICD-10-CM | POA: Diagnosis not present

## 2020-11-09 LAB — BASIC METABOLIC PANEL
BUN/Creatinine Ratio: 12 (ref 12–28)
BUN: 8 mg/dL (ref 8–27)
CO2: 20 mmol/L (ref 20–29)
Calcium: 8.3 mg/dL — ABNORMAL LOW (ref 8.7–10.3)
Chloride: 103 mmol/L (ref 96–106)
Creatinine, Ser: 0.67 mg/dL (ref 0.57–1.00)
Glucose: 97 mg/dL (ref 65–99)
Potassium: 4 mmol/L (ref 3.5–5.2)
Sodium: 138 mmol/L (ref 134–144)
eGFR: 91 mL/min/{1.73_m2} (ref >59)

## 2020-11-09 LAB — MAGNESIUM: Magnesium: 2.3 mg/dL (ref 1.6–2.3)

## 2020-11-09 LAB — POCT INR: INR: 4.5 — AB (ref 2.0–3.0)

## 2020-11-09 NOTE — Patient Instructions (Signed)
Description   Spoke with patient advised to hold today's dose of Warfarin then start taking Warfarin '5mg'$  daily except 2.'5mg'$  on Tuesdays, Thursdays, Saturdays, & Sundays. Recheck INR in 1 week (normally 2 weeks). Call [facility] # [phone] with any changes in medications, if scheduled for any procedures or have any questions.

## 2020-11-10 NOTE — Telephone Encounter (Signed)
ADDITIONAL Labs ordered via [facility] bc she is out of town until [place]

## 2020-11-10 NOTE — Telephone Encounter (Signed)
Patient is returning your call from earlier. 

## 2020-11-10 NOTE — Addendum Note (Signed)
Addended by: Crecencio Mc on: 11/10/2020 01:16 PM   Modules accepted: Orders

## 2020-11-10 NOTE — Telephone Encounter (Signed)
Spoke with pt and she stated that she has had some of the labs done and will go in the morning to have the additional labs drawn.

## 2020-11-10 NOTE — Telephone Encounter (Signed)
LMTCB

## 2020-11-12 LAB — COMPREHENSIVE METABOLIC PANEL
ALT: 39 IU/L — ABNORMAL HIGH (ref 0–32)
AST: 37 IU/L (ref 0–40)
Albumin/Globulin Ratio: 1.6 (ref 1.2–2.2)
Albumin: 4 g/dL (ref 3.7–4.7)
Alkaline Phosphatase: 94 IU/L (ref 44–121)
BUN/Creatinine Ratio: 14 (ref 12–28)
BUN: 10 mg/dL (ref 8–27)
Bilirubin Total: 0.4 mg/dL (ref 0.0–1.2)
CO2: 25 mmol/L (ref 20–29)
Calcium: 9.2 mg/dL (ref 8.7–10.3)
Chloride: 106 mmol/L (ref 96–106)
Creatinine, Ser: 0.72 mg/dL (ref 0.57–1.00)
Globulin, Total: 2.5 g/dL (ref 1.5–4.5)
Glucose: 93 mg/dL (ref 65–99)
Potassium: 4.3 mmol/L (ref 3.5–5.2)
Sodium: 141 mmol/L (ref 134–144)
Total Protein: 6.5 g/dL (ref 6.0–8.5)
eGFR: 87 mL/min/{1.73_m2} (ref >59)

## 2020-11-12 LAB — CALCIUM, IONIZED: Calcium, Ion: 5 mg/dL (ref 4.5–5.6)

## 2020-11-12 LAB — VITAMIN D 25 HYDROXY (VIT D DEFICIENCY, FRACTURES): Vit D, 25-Hydroxy: 48.3 ng/mL (ref 30.0–100.0)

## 2020-11-15 NOTE — Addendum Note (Signed)
Addended by: Crecencio Mc on: 11/15/2020 12:16 AM   Modules accepted: Orders

## 2020-11-16 ENCOUNTER — Ambulatory Visit (INDEPENDENT_AMBULATORY_CARE_PROVIDER_SITE_OTHER): Payer: Medicare Other | Admitting: *Deleted

## 2020-11-16 DIAGNOSIS — Z7901 Long term (current) use of anticoagulants: Secondary | ICD-10-CM | POA: Diagnosis not present

## 2020-11-16 DIAGNOSIS — Q211 Atrial septal defect: Secondary | ICD-10-CM | POA: Diagnosis not present

## 2020-11-16 DIAGNOSIS — Z5181 Encounter for therapeutic drug level monitoring: Secondary | ICD-10-CM

## 2020-11-16 DIAGNOSIS — I480 Paroxysmal atrial fibrillation: Secondary | ICD-10-CM | POA: Diagnosis not present

## 2020-11-16 DIAGNOSIS — Q2112 Patent foramen ovale: Secondary | ICD-10-CM

## 2020-11-16 LAB — POCT INR: INR: 2.4 (ref 2.0–3.0)

## 2020-11-16 NOTE — Patient Instructions (Addendum)
Description   Spoke with patient advised to take 1 tablet then continue taking Warfarin 2.5mg  daily except 5mg  on Monday, Wednesday, and Friday. Recheck INR in 2 weeks. Call [facility] # [phone] with any changes in medications, if scheduled for any procedures or have any questions.

## 2020-11-30 ENCOUNTER — Ambulatory Visit (INDEPENDENT_AMBULATORY_CARE_PROVIDER_SITE_OTHER): Payer: Medicare Other | Admitting: *Deleted

## 2020-11-30 DIAGNOSIS — Q2112 Patent foramen ovale: Secondary | ICD-10-CM | POA: Diagnosis not present

## 2020-11-30 DIAGNOSIS — I480 Paroxysmal atrial fibrillation: Secondary | ICD-10-CM

## 2020-11-30 DIAGNOSIS — Z5181 Encounter for therapeutic drug level monitoring: Secondary | ICD-10-CM

## 2020-11-30 LAB — POCT INR: INR: 2.3 (ref 2.0–3.0)

## 2020-11-30 NOTE — Patient Instructions (Signed)
Description   Spoke with patient advised to take 1 tablet today then start taking Warfarin 5mg  daily except 2.5mg  on Sundays, Tuesdays, and Thursdays. Recheck INR in 2 weeks. Call [facility] # [phone] with any changes in medications, if scheduled for any procedures or have any questions.

## 2020-12-03 DIAGNOSIS — Z2914 Encounter for prophylactic rabies immune globin: Secondary | ICD-10-CM | POA: Diagnosis not present

## 2020-12-03 DIAGNOSIS — Z203 Contact with and (suspected) exposure to rabies: Secondary | ICD-10-CM | POA: Diagnosis not present

## 2020-12-03 DIAGNOSIS — I10 Essential (primary) hypertension: Secondary | ICD-10-CM | POA: Diagnosis not present

## 2020-12-06 DIAGNOSIS — Z2914 Encounter for prophylactic rabies immune globin: Secondary | ICD-10-CM | POA: Diagnosis not present

## 2020-12-06 DIAGNOSIS — Z203 Contact with and (suspected) exposure to rabies: Secondary | ICD-10-CM | POA: Diagnosis not present

## 2020-12-10 DIAGNOSIS — Z203 Contact with and (suspected) exposure to rabies: Secondary | ICD-10-CM | POA: Diagnosis not present

## 2020-12-10 DIAGNOSIS — Z2914 Encounter for prophylactic rabies immune globin: Secondary | ICD-10-CM | POA: Diagnosis not present

## 2020-12-14 ENCOUNTER — Ambulatory Visit (INDEPENDENT_AMBULATORY_CARE_PROVIDER_SITE_OTHER): Payer: Medicare Other | Admitting: *Deleted

## 2020-12-14 DIAGNOSIS — Q2112 Patent foramen ovale: Secondary | ICD-10-CM

## 2020-12-14 DIAGNOSIS — I480 Paroxysmal atrial fibrillation: Secondary | ICD-10-CM

## 2020-12-14 DIAGNOSIS — Z5181 Encounter for therapeutic drug level monitoring: Secondary | ICD-10-CM

## 2020-12-14 LAB — POCT INR: INR: 3 (ref 2.0–3.0)

## 2020-12-17 DIAGNOSIS — Z2914 Encounter for prophylactic rabies immune globin: Secondary | ICD-10-CM | POA: Diagnosis not present

## 2020-12-17 DIAGNOSIS — Z23 Encounter for immunization: Secondary | ICD-10-CM | POA: Diagnosis not present

## 2020-12-17 DIAGNOSIS — Z203 Contact with and (suspected) exposure to rabies: Secondary | ICD-10-CM | POA: Diagnosis not present

## 2020-12-28 ENCOUNTER — Ambulatory Visit (INDEPENDENT_AMBULATORY_CARE_PROVIDER_SITE_OTHER): Payer: Medicare Other | Admitting: *Deleted

## 2020-12-28 DIAGNOSIS — I480 Paroxysmal atrial fibrillation: Secondary | ICD-10-CM

## 2020-12-28 DIAGNOSIS — Z5181 Encounter for therapeutic drug level monitoring: Secondary | ICD-10-CM | POA: Diagnosis not present

## 2020-12-28 DIAGNOSIS — Q2112 Patent foramen ovale: Secondary | ICD-10-CM

## 2020-12-28 LAB — POCT INR: INR: 3.3 — AB (ref 2.0–3.0)

## 2020-12-28 NOTE — Patient Instructions (Addendum)
Description   Spoke with patient advised to continue taking Warfarin 5mg  daily except 2.5mg  on Sundays, Tuesdays, and Thursdays. Recheck INR in 2 weeks. Call [facility] # [phone] with any changes in medications, if scheduled for any procedures or have any questions.

## 2020-12-29 ENCOUNTER — Telehealth: Payer: Self-pay

## 2020-12-29 DIAGNOSIS — Z111 Encounter for screening for respiratory tuberculosis: Secondary | ICD-10-CM

## 2020-12-29 NOTE — Telephone Encounter (Signed)
LMTCB. Need to schedule pt for nurse visit to have a tb skin test done for the form that we received from the [facility].

## 2020-12-31 NOTE — Telephone Encounter (Signed)
Patient called back and will be scheduled for a TB skin test.

## 2020-12-31 NOTE — Telephone Encounter (Signed)
scheduled

## 2021-01-02 DIAGNOSIS — Z23 Encounter for immunization: Secondary | ICD-10-CM | POA: Diagnosis not present

## 2021-01-03 ENCOUNTER — Telehealth: Payer: Self-pay | Admitting: Internal Medicine

## 2021-01-03 NOTE — Telephone Encounter (Signed)
Patient was wondering if she could have her blood test done for TB at  a [facility] in [place], [place].

## 2021-01-03 NOTE — Addendum Note (Signed)
Addended by: Crecencio Mc on: 01/03/2021 12:53 PM   Modules accepted: Orders

## 2021-01-03 NOTE — Telephone Encounter (Signed)
Pt is aware that lab order has been placed and she may have it drawn at a [facility] near her at any time.

## 2021-01-03 NOTE — Telephone Encounter (Signed)
PPD cannot be done via [facility] .  Requires skin test  Ordered the quantiferon gold TB blood test

## 2021-01-03 NOTE — Telephone Encounter (Signed)
Is it okay to order the blood test to be done at [place], pt is currently in [facility].

## 2021-01-04 DIAGNOSIS — Z111 Encounter for screening for respiratory tuberculosis: Secondary | ICD-10-CM | POA: Diagnosis not present

## 2021-01-07 ENCOUNTER — Telehealth: Payer: Self-pay

## 2021-01-07 NOTE — Telephone Encounter (Signed)
LMTCB in regards to labs.

## 2021-01-10 ENCOUNTER — Telehealth: Payer: Self-pay

## 2021-01-10 NOTE — Telephone Encounter (Signed)
LMTCB in regards to lab results.  

## 2021-01-11 ENCOUNTER — Ambulatory Visit (INDEPENDENT_AMBULATORY_CARE_PROVIDER_SITE_OTHER): Payer: Medicare Other

## 2021-01-11 DIAGNOSIS — Z8679 Personal history of other diseases of the circulatory system: Secondary | ICD-10-CM | POA: Diagnosis not present

## 2021-01-11 DIAGNOSIS — Q2112 Patent foramen ovale: Secondary | ICD-10-CM

## 2021-01-11 DIAGNOSIS — Z5181 Encounter for therapeutic drug level monitoring: Secondary | ICD-10-CM | POA: Diagnosis not present

## 2021-01-11 DIAGNOSIS — I480 Paroxysmal atrial fibrillation: Secondary | ICD-10-CM

## 2021-01-11 DIAGNOSIS — I4892 Unspecified atrial flutter: Secondary | ICD-10-CM | POA: Diagnosis not present

## 2021-01-11 DIAGNOSIS — I6350 Cerebral infarction due to unspecified occlusion or stenosis of unspecified cerebral artery: Secondary | ICD-10-CM | POA: Diagnosis not present

## 2021-01-11 LAB — QUANTIFERON-TB GOLD PLUS
QuantiFERON Mitogen Value: >10 IU/mL
QuantiFERON Nil Value: 0.02 IU/mL
QuantiFERON TB1 Ag Value: 0.02 IU/mL
QuantiFERON TB2 Ag Value: 0.02 IU/mL
QuantiFERON-TB Gold Plus: NEGATIVE

## 2021-01-11 LAB — POCT INR: INR: 2.9 (ref 2.0–3.0)

## 2021-01-11 NOTE — Patient Instructions (Signed)
Description   Spoke with patient advised to continue taking Warfarin 5mg  daily except 2.5mg  on Sundays, Tuesdays, and Thursdays. Recheck INR in 2 weeks. Call [facility] # [phone] with any changes in medications, if scheduled for any procedures or have any questions.

## 2021-01-14 ENCOUNTER — Other Ambulatory Visit: Payer: Self-pay | Admitting: Internal Medicine

## 2021-01-25 ENCOUNTER — Ambulatory Visit (INDEPENDENT_AMBULATORY_CARE_PROVIDER_SITE_OTHER): Payer: Medicare Other

## 2021-01-25 DIAGNOSIS — Q2112 Patent foramen ovale: Secondary | ICD-10-CM

## 2021-01-25 DIAGNOSIS — Z5181 Encounter for therapeutic drug level monitoring: Secondary | ICD-10-CM | POA: Diagnosis not present

## 2021-01-25 DIAGNOSIS — I480 Paroxysmal atrial fibrillation: Secondary | ICD-10-CM

## 2021-01-25 LAB — POCT INR: INR: 2.9 (ref 2.0–3.0)

## 2021-01-25 NOTE — Patient Instructions (Signed)
Description   Spoke with patient advised to continue taking Warfarin 5mg  daily except 2.5mg  on Sundays, Tuesdays, and Thursdays. Recheck INR in 2 weeks. Call [facility] # [phone] with any changes in medications, if scheduled for any procedures or have any questions.

## 2021-02-04 ENCOUNTER — Telehealth: Payer: Self-pay | Admitting: Internal Medicine

## 2021-02-04 NOTE — Telephone Encounter (Signed)
Form completed signsd and returned  to you for distribution in red folder

## 2021-02-04 NOTE — Telephone Encounter (Signed)
Pt called in stating she had mail medical forms to Dr. Derrel Nip about 2 to 3 weeks ago. Pt stating that she have called [place] to check to see if they received the medical forms and Tb result. Pt stated that [facility] advise Pt that they have not received any medical forms. Pt is requesting for the forms to be faxed over to [facility]  to [employer] at [phone].

## 2021-02-04 NOTE — Telephone Encounter (Signed)
Spoke with pt to let her know that the form has been completed and faxed. Pt gave a verbal understanding.

## 2021-02-04 NOTE — Telephone Encounter (Signed)
Form has been placed in red folder for completion.

## 2021-02-07 ENCOUNTER — Ambulatory Visit: Payer: Medicare Other

## 2021-02-08 ENCOUNTER — Ambulatory Visit (INDEPENDENT_AMBULATORY_CARE_PROVIDER_SITE_OTHER): Payer: Medicare Other | Admitting: *Deleted

## 2021-02-08 DIAGNOSIS — Z5181 Encounter for therapeutic drug level monitoring: Secondary | ICD-10-CM | POA: Diagnosis not present

## 2021-02-08 DIAGNOSIS — I480 Paroxysmal atrial fibrillation: Secondary | ICD-10-CM | POA: Diagnosis not present

## 2021-02-08 DIAGNOSIS — Q2112 Patent foramen ovale: Secondary | ICD-10-CM | POA: Diagnosis not present

## 2021-02-08 LAB — POCT INR: INR: 3.1 — AB (ref 2.0–3.0)

## 2021-02-08 NOTE — Patient Instructions (Signed)
Description   Spoke with patient advised to continue taking Warfarin 5mg  daily except 2.5mg  on Sundays, Tuesdays, and Thursdays. Recheck INR in 2 weeks. Call [facility] # [phone] with any changes in medications, if scheduled for any procedures or have any questions.

## 2021-02-10 ENCOUNTER — Ambulatory Visit: Payer: Medicare Other

## 2021-02-22 ENCOUNTER — Ambulatory Visit (INDEPENDENT_AMBULATORY_CARE_PROVIDER_SITE_OTHER): Payer: Medicare Other | Admitting: *Deleted

## 2021-02-22 DIAGNOSIS — I480 Paroxysmal atrial fibrillation: Secondary | ICD-10-CM

## 2021-02-22 DIAGNOSIS — Z5181 Encounter for therapeutic drug level monitoring: Secondary | ICD-10-CM | POA: Diagnosis not present

## 2021-02-22 DIAGNOSIS — Q2112 Patent foramen ovale: Secondary | ICD-10-CM | POA: Diagnosis not present

## 2021-02-22 LAB — POCT INR: INR: 3.6 — AB (ref 2.0–3.0)

## 2021-02-22 NOTE — Patient Instructions (Signed)
Description   Spoke with patient advised to take 2.5mg  today and tomorrow, then resume same dosage of Warfarin 5mg  daily except 2.5mg  on Sundays, Tuesdays, and Thursdays. Recheck INR in 2 weeks. Call [facility] # [phone] with any changes in medications, if scheduled for any procedures or have any questions.

## 2021-03-01 ENCOUNTER — Other Ambulatory Visit: Payer: Self-pay | Admitting: Internal Medicine

## 2021-03-02 NOTE — Telephone Encounter (Signed)
Prescription refill request received for warfarin Lov: 08/17/20 Kendra [name] Cancel)  Next INR check: 03/08/21 Warfarin tablet strength: 5mg   Appropriate dose and refill sent to requested pharmacy.

## 2021-03-04 DIAGNOSIS — M79671 Pain in right foot: Secondary | ICD-10-CM | POA: Diagnosis not present

## 2021-03-04 DIAGNOSIS — Z6822 Body mass index (BMI) 22.0-22.9, adult: Secondary | ICD-10-CM | POA: Diagnosis not present

## 2021-03-08 ENCOUNTER — Ambulatory Visit (INDEPENDENT_AMBULATORY_CARE_PROVIDER_SITE_OTHER): Payer: Medicare Other | Admitting: *Deleted

## 2021-03-08 DIAGNOSIS — I6350 Cerebral infarction due to unspecified occlusion or stenosis of unspecified cerebral artery: Secondary | ICD-10-CM | POA: Diagnosis not present

## 2021-03-08 DIAGNOSIS — I4892 Unspecified atrial flutter: Secondary | ICD-10-CM | POA: Diagnosis not present

## 2021-03-08 DIAGNOSIS — Z5181 Encounter for therapeutic drug level monitoring: Secondary | ICD-10-CM

## 2021-03-08 DIAGNOSIS — Q2112 Patent foramen ovale: Secondary | ICD-10-CM | POA: Diagnosis not present

## 2021-03-08 DIAGNOSIS — I480 Paroxysmal atrial fibrillation: Secondary | ICD-10-CM | POA: Diagnosis not present

## 2021-03-08 DIAGNOSIS — Z8679 Personal history of other diseases of the circulatory system: Secondary | ICD-10-CM | POA: Diagnosis not present

## 2021-03-08 LAB — POCT INR: INR: 2.3 (ref 2.0–3.0)

## 2021-03-08 NOTE — Patient Instructions (Signed)
Description   Spoke with patient advised to take 1 tablet today then resume same dosage of Warfarin 5mg  daily except 2.5mg  on Sundays, Tuesdays, and Thursdays. Recheck INR in 2 weeks. Call [facility] # [phone] with any changes in medications, if scheduled for any procedures or have any questions.

## 2021-03-10 DIAGNOSIS — M79671 Pain in right foot: Secondary | ICD-10-CM | POA: Diagnosis not present

## 2021-03-22 ENCOUNTER — Ambulatory Visit (INDEPENDENT_AMBULATORY_CARE_PROVIDER_SITE_OTHER): Payer: Medicare Other

## 2021-03-22 DIAGNOSIS — I480 Paroxysmal atrial fibrillation: Secondary | ICD-10-CM

## 2021-03-22 DIAGNOSIS — Z5181 Encounter for therapeutic drug level monitoring: Secondary | ICD-10-CM

## 2021-03-22 DIAGNOSIS — Q2112 Patent foramen ovale: Secondary | ICD-10-CM

## 2021-03-22 LAB — POCT INR: INR: 3.4 — AB (ref 2.0–3.0)

## 2021-03-22 NOTE — Patient Instructions (Signed)
Description   Spoke with patient advised to continue on same dosage of Warfarin 5mg  daily except 2.5mg  on Sundays, Tuesdays, and Thursdays. Recheck INR in 2 weeks. Call [facility] # [phone] with any changes in medications, if scheduled for any procedures or have any questions.

## 2021-03-23 DIAGNOSIS — Z961 Presence of intraocular lens: Secondary | ICD-10-CM | POA: Diagnosis not present

## 2021-03-23 DIAGNOSIS — H52203 Unspecified astigmatism, bilateral: Secondary | ICD-10-CM | POA: Diagnosis not present

## 2021-03-30 DIAGNOSIS — Z1231 Encounter for screening mammogram for malignant neoplasm of breast: Secondary | ICD-10-CM | POA: Diagnosis not present

## 2021-03-30 LAB — HM MAMMOGRAPHY

## 2021-03-31 ENCOUNTER — Ambulatory Visit: Payer: Medicare Other | Admitting: Internal Medicine

## 2021-04-05 ENCOUNTER — Ambulatory Visit (INDEPENDENT_AMBULATORY_CARE_PROVIDER_SITE_OTHER): Payer: Medicare Other

## 2021-04-05 DIAGNOSIS — Z5181 Encounter for therapeutic drug level monitoring: Secondary | ICD-10-CM

## 2021-04-05 DIAGNOSIS — Q2112 Patent foramen ovale: Secondary | ICD-10-CM | POA: Diagnosis not present

## 2021-04-05 DIAGNOSIS — I480 Paroxysmal atrial fibrillation: Secondary | ICD-10-CM | POA: Diagnosis not present

## 2021-04-05 LAB — POCT INR: INR: 3.5 — AB (ref 2.0–3.0)

## 2021-04-05 NOTE — Patient Instructions (Signed)
Description   Spoke with patient advised to continue taking Warfarin 5mg  daily except 2.5mg  on Sundays, Tuesdays, and Thursdays. Recheck INR in 2 weeks. Call [facility] # [phone] with any changes in medications, if scheduled for any procedures or have any questions.

## 2021-04-19 ENCOUNTER — Ambulatory Visit (INDEPENDENT_AMBULATORY_CARE_PROVIDER_SITE_OTHER): Payer: Medicare Other

## 2021-04-19 DIAGNOSIS — Q2112 Patent foramen ovale: Secondary | ICD-10-CM

## 2021-04-19 DIAGNOSIS — I480 Paroxysmal atrial fibrillation: Secondary | ICD-10-CM

## 2021-04-19 DIAGNOSIS — Z5181 Encounter for therapeutic drug level monitoring: Secondary | ICD-10-CM | POA: Diagnosis not present

## 2021-04-19 LAB — POCT INR: INR: 3.6 — AB (ref 2.0–3.0)

## 2021-04-19 NOTE — Patient Instructions (Signed)
Description   Spoke with patient advised to skip today's dosage of Warfarin, then resume same dosage of Warfarin 5mg  daily except 2.5mg  on Sundays, Tuesdays, and Thursdays. Recheck INR in 2 weeks. Call [facility] # [phone] with any changes in medications, if scheduled for any procedures or have any questions.

## 2021-04-26 DIAGNOSIS — L814 Other melanin hyperpigmentation: Secondary | ICD-10-CM | POA: Diagnosis not present

## 2021-04-26 DIAGNOSIS — L570 Actinic keratosis: Secondary | ICD-10-CM | POA: Diagnosis not present

## 2021-04-26 DIAGNOSIS — D225 Melanocytic nevi of trunk: Secondary | ICD-10-CM | POA: Diagnosis not present

## 2021-04-26 DIAGNOSIS — Z23 Encounter for immunization: Secondary | ICD-10-CM | POA: Diagnosis not present

## 2021-04-26 DIAGNOSIS — L821 Other seborrheic keratosis: Secondary | ICD-10-CM | POA: Diagnosis not present

## 2021-04-26 DIAGNOSIS — L578 Other skin changes due to chronic exposure to nonionizing radiation: Secondary | ICD-10-CM | POA: Diagnosis not present

## 2021-05-03 ENCOUNTER — Ambulatory Visit (INDEPENDENT_AMBULATORY_CARE_PROVIDER_SITE_OTHER): Payer: Medicare Other

## 2021-05-03 DIAGNOSIS — Z8679 Personal history of other diseases of the circulatory system: Secondary | ICD-10-CM | POA: Diagnosis not present

## 2021-05-03 DIAGNOSIS — Q2112 Patent foramen ovale: Secondary | ICD-10-CM

## 2021-05-03 DIAGNOSIS — I480 Paroxysmal atrial fibrillation: Secondary | ICD-10-CM | POA: Diagnosis not present

## 2021-05-03 DIAGNOSIS — Z5181 Encounter for therapeutic drug level monitoring: Secondary | ICD-10-CM | POA: Diagnosis not present

## 2021-05-03 DIAGNOSIS — I6350 Cerebral infarction due to unspecified occlusion or stenosis of unspecified cerebral artery: Secondary | ICD-10-CM | POA: Diagnosis not present

## 2021-05-03 DIAGNOSIS — I4892 Unspecified atrial flutter: Secondary | ICD-10-CM | POA: Diagnosis not present

## 2021-05-03 LAB — POCT INR: INR: 3.2 — AB (ref 2.0–3.0)

## 2021-05-03 NOTE — Patient Instructions (Signed)
Description   ?Spoke with patient advised to continue on same dosage of Warfarin '5mg'$  daily except 2.'5mg'$  on Sundays, Tuesdays, and Thursdays. Recheck INR in 2 weeks. Call [facility] # [phone] with any changes in medications, if scheduled for any procedures or have any questions.  ?  ?  ?

## 2021-05-04 ENCOUNTER — Other Ambulatory Visit: Payer: Self-pay

## 2021-05-04 ENCOUNTER — Ambulatory Visit (INDEPENDENT_AMBULATORY_CARE_PROVIDER_SITE_OTHER): Payer: Medicare Other | Admitting: Internal Medicine

## 2021-05-04 ENCOUNTER — Encounter: Payer: Self-pay | Admitting: Internal Medicine

## 2021-05-04 VITALS — BP 132/74 | HR 68 | Temp 97.6°F | Ht 68.0 in | Wt 158.0 lb

## 2021-05-04 DIAGNOSIS — E559 Vitamin D deficiency, unspecified: Secondary | ICD-10-CM

## 2021-05-04 DIAGNOSIS — Q2112 Patent foramen ovale: Secondary | ICD-10-CM

## 2021-05-04 DIAGNOSIS — R7301 Impaired fasting glucose: Secondary | ICD-10-CM

## 2021-05-04 DIAGNOSIS — E7800 Pure hypercholesterolemia, unspecified: Secondary | ICD-10-CM

## 2021-05-04 DIAGNOSIS — Z8739 Personal history of other diseases of the musculoskeletal system and connective tissue: Secondary | ICD-10-CM | POA: Diagnosis not present

## 2021-05-04 DIAGNOSIS — Z79899 Other long term (current) drug therapy: Secondary | ICD-10-CM

## 2021-05-04 DIAGNOSIS — D751 Secondary polycythemia: Secondary | ICD-10-CM

## 2021-05-04 DIAGNOSIS — I10 Essential (primary) hypertension: Secondary | ICD-10-CM | POA: Diagnosis not present

## 2021-05-04 DIAGNOSIS — M810 Age-related osteoporosis without current pathological fracture: Secondary | ICD-10-CM | POA: Diagnosis not present

## 2021-05-04 LAB — CBC WITH DIFFERENTIAL/PLATELET
Basophils Absolute: 0 10*3/uL (ref 0.0–0.1)
Basophils Relative: 0.7 % (ref 0.0–3.0)
Eosinophils Absolute: 0.1 10*3/uL (ref 0.0–0.7)
Eosinophils Relative: 1.5 % (ref 0.0–5.0)
HCT: 44.8 % (ref 36.0–46.0)
Hemoglobin: 15.7 g/dL — ABNORMAL HIGH (ref 12.0–15.0)
Lymphocytes Relative: 25.8 % (ref 12.0–46.0)
Lymphs Abs: 1.6 10*3/uL (ref 0.7–4.0)
MCHC: 35 g/dL (ref 30.0–36.0)
MCV: 91.2 fl (ref 78.0–100.0)
Monocytes Absolute: 0.5 10*3/uL (ref 0.1–1.0)
Monocytes Relative: 8.6 % (ref 3.0–12.0)
Neutro Abs: 3.9 10*3/uL (ref 1.4–7.7)
Neutrophils Relative %: 63.4 % (ref 43.0–77.0)
Platelets: 237 10*3/uL (ref 150.0–400.0)
RBC: 4.91 Mil/uL (ref 3.87–5.11)
RDW: 12.9 % (ref 11.5–15.5)
WBC: 6.1 10*3/uL (ref 4.0–10.5)

## 2021-05-04 LAB — LIPID PANEL
Cholesterol: 178 mg/dL (ref 0–200)
HDL: 68.5 mg/dL (ref >39.00)
LDL Cholesterol: 83 mg/dL (ref 0–99)
NonHDL: 109.27
Total CHOL/HDL Ratio: 3
Triglycerides: 129 mg/dL (ref 0.0–149.0)
VLDL: 25.8 mg/dL (ref 0.0–40.0)

## 2021-05-04 LAB — HEMOGLOBIN A1C: Hgb A1c MFr Bld: 5.4 % (ref 4.6–6.5)

## 2021-05-04 LAB — COMPREHENSIVE METABOLIC PANEL
ALT: 21 U/L (ref 0–35)
AST: 26 U/L (ref 0–37)
Albumin: 4.7 g/dL (ref 3.5–5.2)
Alkaline Phosphatase: 74 U/L (ref 39–117)
BUN: 16 mg/dL (ref 6–23)
CO2: 27 mEq/L (ref 19–32)
Calcium: 10 mg/dL (ref 8.4–10.5)
Chloride: 102 mEq/L (ref 96–112)
Creatinine, Ser: 0.9 mg/dL (ref 0.40–1.20)
GFR: 61.88 mL/min (ref >60.00)
Glucose, Bld: 86 mg/dL (ref 70–99)
Potassium: 4.5 mEq/L (ref 3.5–5.1)
Sodium: 139 mEq/L (ref 135–145)
Total Bilirubin: 1 mg/dL (ref 0.2–1.2)
Total Protein: 7.4 g/dL (ref 6.0–8.3)

## 2021-05-04 LAB — VITAMIN D 25 HYDROXY (VIT D DEFICIENCY, FRACTURES): VITD: 46.98 ng/mL (ref 30.00–100.00)

## 2021-05-04 LAB — MICROALBUMIN / CREATININE URINE RATIO
Creatinine,U: 11.1 mg/dL
Microalb Creat Ratio: 6.3 mg/g (ref 0.0–30.0)
Microalb, Ur: <0.7 mg/dL (ref 0.0–1.9)

## 2021-05-04 LAB — TSH: TSH: 1.27 u[IU]/mL (ref 0.35–5.50)

## 2021-05-04 LAB — URIC ACID: Uric Acid, Serum: 5.6 mg/dL (ref 2.4–7.0)

## 2021-05-04 MED ORDER — TRAZODONE HCL 50 MG PO TABS: 25.0000 mg | ORAL_TABLET | Freq: Every evening | ORAL | 3 refills | Status: DC | PRN

## 2021-05-04 MED ORDER — PREDNISONE 10 MG PO TABS: ORAL_TABLET | ORAL | 0 refills | Status: DC

## 2021-05-04 NOTE — Patient Instructions (Addendum)
?  Good to see you!  ? ?I recommend  adding Benefiber if needed for constipation ? ?Prednisone taper sent to [facility]  ? ?Trial of trazodone as needed for insomnia ? ? ? ? ? ?

## 2021-05-04 NOTE — Assessment & Plan Note (Addendum)
Managed with alendronate since  2018.  SS increase in density of spine and left hip is noted on   Repeat DEXA April 2022 and now in the steopenia range.  .  Will consider dc alendronate next year.  ?

## 2021-05-04 NOTE — Assessment & Plan Note (Addendum)
Embolic stroke risk managed with coumadin  AND BABY ASA .  Marland Kitchen INR managed by Dr Tanna Furry clinic  ?

## 2021-05-04 NOTE — Assessment & Plan Note (Signed)
Managed with rosuvastatin  ?

## 2021-05-04 NOTE — Progress Notes (Addendum)
Patient ID: Kendra [name], female    DOB: 1945-11-30  Age: 76 y.o. MRN: [id number]  The patient is here for annual follow up and management of other chronic and acute problems.  This visit occurred during the SARS-CoV-2 public health emergency.  Safety protocols were in place, including screening questions prior to the visit, additional usage of staff PPE, and extensive cleaning of exam room while observing appropriate contact time as indicated for disinfecting solutions.     The risk factors are reflected in the social history.  The roster of all physicians providing medical care to patient - is listed in the Snapshot section of the chart.  Activities of daily living:  The patient is 100% independent in all ADLs: dressing, toileting, feeding as well as independent mobility  Home safety : The patient has smoke detectors in the home. They wear seatbelts.  There are no firearms at home. There is no violence in the home.   There is no risks for hepatitis, STDs or HIV. There is no   history of blood transfusion. They have no travel history to infectious disease endemic areas of the world.  The patient has seen their dentist in the last six month. They have seen their eye doctor in the last year. They admit to slight hearing difficulty with regard to whispered voices and some television programs.  They have deferred audiologic testing in the last year.  They do not  have excessive sun exposure. Discussed the need for sun protection: hats, long sleeves and use of sunscreen if there is significant sun exposure.   Diet: the importance of a healthy diet is discussed. They do have a healthy diet.  The benefits of regular aerobic exercise were discussed. She walks 5  times per week ,  for 30 minutes, and gardens in the spring/summer .   Depression screen: there are no signs or vegative symptoms of depression- irritability, change in appetite, anhedonia, sadness/tearfullness.  Cognitive assessment: the  patient manages all their financial and personal affairs and is actively engaged. They could relate day,date,year and events; recalled 2/3 objects at 3 minutes; performed clock-face test normally.  The following portions of the patient's history were reviewed and updated as appropriate: allergies, current medications, past family history, past medical history,  past surgical history, past social history  and problem list.  Visual acuity was not assessed per patient preference since she has annual  follow up with her ophthalmologist. Hearing and body mass index were assessed and reviewed.   During the course of the visit the patient was educated and counseled about appropriate screening and preventive services including : fall prevention , diabetes screening, nutrition counseling, colorectal cancer screening, and recommended immunizations.    CC: The primary encounter diagnosis was Essential hypertension. Diagnoses of Pure hypercholesterolemia, Long-term use of high-risk medication, Impaired fasting glucose, History of gout, PFO (patent foramen ovale), Vitamin D deficiency, Secondary erythrocytosis, and Age-related osteoporosis without current pathological fracture were also pertinent to this visit.  1) 2nd gout flare one month ago.  Resolved with a 6 day prednisone. Taper.  Seen by podiatry.    First episode occurred in  2017.     2)PFO;  surveillance managed by  Dr. Lovena Le in [place] with coumadin and baby asa.  Sees him annually.  No recent ECHO.   3) HTN:  home readings <130/80.  Taking losartan and metoprolol   4) colonoscopy due in 2025 (10 yr), mammogram last month (normal) .    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   She has a past surgical history that includes Cesarean section (1981); fibroid tumor removal (1996); Cardiac electrophysiology study and ablation (2002); Radiology with anesthesia (N/A, 02/16/2015); TEE without cardioversion (N/A, 03/08/2015); TEE without cardioversion (N/A, 04/28/2015); Breast biopsy (Right, 1991); Appendectomy (1966); Cataract extraction w/ intraocular lens  implant, bilateral (Bilateral); and Hallux valgus lapidus (Right, 04/16/2020).   Her family history includes Alcohol abuse in her father; Lung cancer in her brother and father; Other in her brother; Stroke in her maternal grandmother.She reports that she has never smoked. She has never used smokeless tobacco. She reports that she does not drink alcohol and does not use drugs.  Outpatient Medications Prior to Visit  Medication Sig Dispense Refill   alendronate (FOSAMAX) 70 MG tablet TAKE 1 TABLET EVERY 7 DAYS WITH A FULL GLASS WATER ON AN EMPTY STOMACH 12 tablet 2   aspirin 81 MG tablet Take 81 mg by mouth daily.     cholecalciferol (VITAMIN D) 1000 units tablet Take 1,000 Units by mouth daily.     furosemide (LASIX) 20 MG tablet TAKE 1 TABLET BY MOUTH EVERY DAY 90 tablet 3   losartan (COZAAR) 25 MG tablet Take 1 tablet (25 mg total) by mouth daily. PLEASE CONTACT OFFICE FOR ADDITIONAL REFILLS 90 tablet 0   Magnesium 100 MG TABS Take 100 mg by mouth every other day.     metoprolol succinate (TOPROL-XL) 100 MG 24 hr tablet TAKE 1 TABLET EVERY DAY WITH OR AFTER A MEAL 90 tablet 1   Multiple Vitamins-Minerals (MULTIVITAMIN WITH MINERALS) tablet Take 1 tablet by  mouth daily.     rosuvastatin (CRESTOR) 5 MG tablet TAKE 1 TABLET BY MOUTH EVERY DAY 90 tablet 3   warfarin (COUMADIN) 5 MG tablet TAKE 1/2 TO 1 TABLET BY MOUTH DAILY AS DIRECTED BY THE COUMADIN CLINIC 90 tablet 1   Zinc 30 MG TABS Take 1 tablet by mouth every other day.     No facility-administered medications prior to visit.    Review of Systems  Patient denies headache, fevers, malaise, unintentional weight loss, skin rash, eye pain, sinus congestion and sinus pain, sore throat, dysphagia,  hemoptysis , cough, dyspnea, wheezing, chest pain, palpitations, orthopnea, edema, abdominal pain, nausea, melena, diarrhea, constipation, flank pain, dysuria, hematuria, urinary  Frequency, nocturia, numbness, tingling, seizures,  Focal weakness, Loss of consciousness,  Tremor, insomnia, depression, anxiety, and suicidal ideation.     Objective:  BP 132/74 (BP Location: Left Arm, Patient Position: Sitting, Cuff Size: Normal)    Pulse 68    Temp 97.6 F (36.4 C) (Oral)    Ht $R'5\' 8"'[place]$  (1.727 m)    Wt 158 lb (71.7 kg)    SpO2 97%    BMI 24.02 kg/m   Physical Exam  General appearance: alert, cooperative and appears stated age Ears: normal TM's and external ear canals both ears Throat: lips, mucosa, and tongue normal; teeth and gums normal Neck: no adenopathy, no carotid bruit, supple, symmetrical, trachea midline and thyroid not enlarged, symmetric, no  tenderness/mass/nodules Back: symmetric, no curvature. ROM normal. No CVA tenderness. Lungs: clear to auscultation bilaterally Heart: regular rate and rhythm, S1, S2 normal, no murmur, click, rub or gallop Abdomen: soft, non-tender; bowel sounds normal; no masses,  no organomegaly Pulses: 2+ and symmetric Skin: Skin color, texture, turgor normal. No rashes or lesions Lymph nodes: Cervical, supraclavicular, and axillary nodes normal.  Assessment & Plan:   Problem List Items Addressed This Visit     Essential hypertension - Primary    Well controlled  on current regimen. Renal function is due and has been historically  stable, no changes today.      Relevant Orders   Comp Met (CMET) (Completed)   Urine Microalbumin w/creat. ratio (Completed)   Comprehensive metabolic panel   History of gout    2 episodes total since 2017,  Affecting toes on right foot . Checking uric acid .  Treated with prednisone taper.    Lab Results  Component Value Date   LABURIC 5.6 05/04/2021        Relevant Orders   Uric acid (Completed)   Osteoporosis    Managed with alendronate since  2018.  SS increase in density of spine and left hip is noted on   Repeat DEXA April 2022 and now in the steopenia range.  .  Will consider dc alendronate next year.       PFO (patent foramen ovale)    Embolic stroke risk managed with coumadin  AND BABY ASA .  Marland Kitchen INR managed by Dr Tanna Furry clinic       PURE HYPERCHOLESTEROLEMIA    Managed with rosuvastatin       Relevant Orders   Lipid Profile (Completed)   Secondary erythrocytosis    She was evaluated repeatedly by oncology until Dec 2019 when she requested no further follow up  Rechecking hgb today   Lab Results  Component Value Date   WBC 6.1 05/04/2021   HGB 15.7 (H) 05/04/2021   HCT 44.8 05/04/2021   MCV 91.2 05/04/2021   PLT 237.0 05/04/2021         Relevant Orders   CBC with Differential/Platelet   Vitamin D deficiency   Relevant Orders   VITAMIN D 25 Hydroxy (Vit-D Deficiency, Fractures) (Completed)   Other Visit Diagnoses     Long-term use of high-risk medication       Relevant Orders   TSH (Completed)   CBC with Differential/Platelet (Completed)   Impaired fasting glucose       Relevant Orders   HgB A1c (Completed)       Meds ordered this encounter  Medications   predniSONE (DELTASONE) 10 MG tablet    Sig: 6 tablets on Day 1 , then reduce by 1 tablet daily until gone    Dispense:  21 tablet    Refill:  0   traZODone (DESYREL) 50 MG tablet    Sig: Take 0.5-1 tablets (25-50 mg  total) by mouth at bedtime as needed for sleep.    Dispense:  30 tablet    Refill:  3    Medications Discontinued During This Encounter  Medication Reason   Zinc 30 MG TABS     Follow-up: No follow-ups on file.   Crecencio Mc, MD

## 2021-05-04 NOTE — Assessment & Plan Note (Addendum)
2 episodes total since 2017,  Affecting toes on right foot . Checking uric acid .  Treated with prednisone taper.   ? ?Lab Results  ?Component Value Date  ? LABURIC 5.6 05/04/2021  ? ?

## 2021-05-04 NOTE — Assessment & Plan Note (Signed)
Well controlled on current regimen. Renal function is due and has been historically  stable, no changes today. ?

## 2021-05-05 NOTE — Assessment & Plan Note (Signed)
She was evaluated repeatedly by oncology until Dec 2019 when she requested no further follow up  Rechecking hgb today  ? ?Lab Results  ?Component Value Date  ? WBC 6.1 05/04/2021  ? HGB 15.7 (H) 05/04/2021  ? HCT 44.8 05/04/2021  ? MCV 91.2 05/04/2021  ? PLT 237.0 05/04/2021  ? ? ?

## 2021-05-05 NOTE — Addendum Note (Signed)
Addended by: Crecencio Mc on: 05/05/2021 02:08 PM ? ? Modules accepted: Orders ? ?

## 2021-05-17 ENCOUNTER — Ambulatory Visit (INDEPENDENT_AMBULATORY_CARE_PROVIDER_SITE_OTHER): Payer: Medicare Other

## 2021-05-17 DIAGNOSIS — I480 Paroxysmal atrial fibrillation: Secondary | ICD-10-CM

## 2021-05-17 DIAGNOSIS — Q2112 Patent foramen ovale: Secondary | ICD-10-CM | POA: Diagnosis not present

## 2021-05-17 DIAGNOSIS — Z5181 Encounter for therapeutic drug level monitoring: Secondary | ICD-10-CM | POA: Diagnosis not present

## 2021-05-17 LAB — POCT INR: INR: 2.7 (ref 2.0–3.0)

## 2021-05-17 NOTE — Patient Instructions (Signed)
Description   ?Spoke with patient advised to continue on same dosage of Warfarin '5mg'$  daily except 2.'5mg'$  on Sundays, Tuesdays, and Thursdays. Recheck INR in 2 weeks. Call Cou[facility]in Clinic # [phone] with any changes in medications, if scheduled for any procedures or have any questions.  ?  ?  ?

## 2021-05-20 ENCOUNTER — Ambulatory Visit (INDEPENDENT_AMBULATORY_CARE_PROVIDER_SITE_OTHER): Payer: Medicare Other

## 2021-05-20 VITALS — Ht 68.0 in | Wt 158.0 lb

## 2021-05-20 DIAGNOSIS — Z0000 Encounter for general adult medical examination without abnormal findings: Secondary | ICD-10-CM | POA: Diagnosis not present

## 2021-05-20 NOTE — Patient Instructions (Addendum)
?  Kendra [name] , ?Thank you for taking time to come for your Medicare Wellness Visit. I appreciate your ongoing commitment to your health goals. Please review the following plan we discussed and let me know if I can assist you in the future.  ? ?These are the goals we discussed: ? Goals   ? ?  Follow up with Primary Care Provider   ?  As needed. ?  ? ?  ?  ?This is a list of the screening recommended for you and due dates:  ?Health Maintenance  ?Topic Date Due  ? COVID-19 Vaccine (4 - Booster for Moderna series) 05/20/2021*  ? Mammogram  03/30/2022  ? Tetanus Vaccine  04/26/2024  ? Pneumonia Vaccine  Completed  ? Flu Shot  Completed  ? DEXA scan (bone density measurement)  Completed  ? Hepatitis C Screening: USPSTF Recommendation to screen - Ages 10-79 yo.  Completed  ? Zoster (Shingles) Vaccine  Completed  ? HPV Vaccine  Aged Out  ? Colon Cancer Screening  Discontinued  ?*Topic was postponed. The date shown is not the original due date.  ?  ?

## 2021-05-20 NOTE — Progress Notes (Addendum)
Subjective:   Kendra [name] is a 76 y.o. female who presents for Medicare Annual (Subsequent) preventive examination.  Review of Systems    No ROS.  Medicare Wellness Virtual Visit.  Visual/audio telehealth visit, UTA vital signs.   See social history for additional risk factors.   Cardiac Risk Factors include: advanced age (>77men, >38 women)     Objective:    Today's Vitals   05/20/21 0920  Weight: 158 lb (71.7 kg)  Height: 5\' 8"  (1.727 m)   Body mass index is 24.02 kg/m.     05/20/2021    9:24 AM 04/16/2020    9:13 AM 03/26/2020    8:59 AM 10/01/2019   10:06 AM 09/30/2018    9:44 AM 03/08/2016   10:22 AM 12/14/2015    9:26 AM  Advanced Directives  Does Patient Have a Medical Advance Directive? No No No Yes Yes    Type of Theme park manager;Living will Healthcare Power of [place];Living will    Does patient want to make changes to medical advance directive?    No - Patient declined No - Patient declined    Copy of Healthcare Power of Attorney in Chart?    No - copy requested No - copy requested    Would patient like information on creating a medical advance directive? No - Patient declined No - Patient declined No - Patient declined         Information is confidential and restricted. Go to Review Flowsheets to unlock data.    Current Medications (verified) Outpatient Encounter Medications as of 05/20/2021  Medication Sig   alendronate (FOSAMAX) 70 MG tablet TAKE 1 TABLET EVERY 7 DAYS WITH A FULL GLASS WATER ON AN EMPTY STOMACH   aspirin 81 MG tablet Take 81 mg by mouth daily.   cholecalciferol (VITAMIN D) 1000 units tablet Take 1,000 Units by mouth daily.   furosemide (LASIX) 20 MG tablet TAKE 1 TABLET BY MOUTH EVERY DAY   losartan (COZAAR) 25 MG tablet Take 1 tablet (25 mg total) by mouth daily. PLEASE CONTACT OFFICE FOR ADDITIONAL REFILLS   Magnesium 100 MG TABS Take 100 mg by mouth every other day.   metoprolol succinate (TOPROL-XL)  100 MG 24 hr tablet TAKE 1 TABLET EVERY DAY WITH OR AFTER A MEAL   Multiple Vitamins-Minerals (MULTIVITAMIN WITH MINERALS) tablet Take 1 tablet by mouth daily.   predniSONE (DELTASONE) 10 MG tablet 6 tablets on Day 1 , then reduce by 1 tablet daily until gone   rosuvastatin (CRESTOR) 5 MG tablet TAKE 1 TABLET BY MOUTH EVERY DAY   traZODone (DESYREL) 50 MG tablet Take 0.5-1 tablets (25-50 mg total) by mouth at bedtime as needed for sleep.   warfarin (COUMADIN) 5 MG tablet TAKE 1/2 TO 1 TABLET BY MOUTH DAILY AS DIRECTED BY THE COUMADIN CLINIC   No facility-administered encounter medications on file as of 05/20/2021.    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   Kidney infarction [facility])    PFO (patent foramen ovale)    Secondary erythrocytosis    Secundum ASD    Stroke (HCC) 01/2015   TIA (transient ischemic attack)    as per 07/30/00 note from [facility]    Vitamin D deficiency    Past Surgical History:  Procedure Laterality Date   APPENDECTOMY  1966   BREAST BIOPSY Right 1991   CARDIAC ELECTROPHYSIOLOGY STUDY AND ABLATION  2002   CATARACT EXTRACTION W/ INTRAOCULAR LENS  IMPLANT, BILATERAL Bilateral    CESAREAN SECTION  1981   fibroid tumor removal  1996   HALLUX VALGUS LAPIDUS Right 04/16/2020   Procedure: HALLUX VALGUS LAPIDUS;  Surgeon: Gwyneth Revels, DPM;  Location: [facility] ORS;  Service: Podiatry;  Laterality: Right;   RADIOLOGY WITH  ANESTHESIA N/A 02/16/2015   Procedure: RADIOLOGY WITH ANESTHESIA;  Surgeon: Medication Radiologist, MD;  Location: [facility] NEURO ORS;  Service: Radiology;  Laterality: N/A;   TEE WITHOUT CARDIOVERSION N/A 03/08/2015   Procedure: TRANSESOPHAGEAL ECHOCARDIOGRAM (TEE);  Surgeon: Lars Masson, MD;  Location: [facility] ENDOSCOPY;  Service: Cardiovascular;  Laterality: N/A;   TEE WITHOUT CARDIOVERSION N/A 04/28/2015   Procedure: TRANSESOPHAGEAL ECHOCARDIOGRAM (TEE);  Surgeon: Lars Masson, MD;  Location: [facility] ENDOSCOPY;  Service: Cardiovascular;  Laterality: N/A;   Family History  Problem Relation Age of Onset   Stroke Maternal Grandmother    Alcohol abuse Father    Lung cancer Father    Other Brother        Emotional Illness   Lung cancer Brother    Colon cancer Neg Hx    Social History   Socioeconomic History   Marital status: Married    Spouse name: Fayrene Fearing   Number of children: 4   Years of education: Not on file   Highest education level: Not on file  Occupational History   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  Tobacco Use   Smoking status: Never   Smokeless tobacco: Never  Vaping Use   Vaping Use: Never used  Substance and Sexual Activity   Alcohol use: No    Alcohol/week: 0.0 standard drinks   Drug use: No   Sexual activity: Not on file  Other Topics Concern   Not on file  Social History Narrative   ** Merged History Encounter **       Lives with husband   No caffeine drinks    Social Determinants of Corporate investment banker Strain: Low Risk    Difficulty of Paying Living Expenses: Not hard at all  Food Insecurity: No Food Insecurity   Worried About Programme researcher, broadcasting/film/video in the Last Year: Never true   Barista in the Last Year: Never true  Transportation Needs: No Transportation Needs   Lack of Transportation (Medical): No   Lack of Transportation (Non-Medical): No  Physical Activity: Sufficiently Active   Days of Exercise per Week: 5 days   Minutes of Exercise  per Session: 60 min  Stress: No Stress Concern Present   Feeling of Stress : Not at all  Social Connections: Unknown   Frequency of Communication with Friends and Family: Not on file   Frequency of Social Gatherings with Friends and Family: Not on file   Attends Religious Services: Not on file   Active Member of Clubs or Organizations: Not on file   Attends Banker Meetings:  Not on file   Marital Status: Married    Tobacco Counseling Counseling given: Not Answered   Clinical Intake:  Pre-visit preparation completed: Yes        Diabetes: No  How often do you need to have someone help you when you read instructions, pamphlets, or other written materials from your doctor or pharmacy?: 1 - Never    Interpreter Needed?: No      Activities of Daily Living    05/20/2021    9:27 AM  In your present state of health, do you have any difficulty performing the following activities:  Hearing? 0  Vision? 0  Difficulty concentrating or making decisions? 0  Walking or climbing stairs? 0  Dressing or bathing? 0  Doing errands, shopping? 0  Preparing Food and eating ? N  Using the Toilet? N  In the past six months, have you accidently leaked urine? N  Do you have problems with loss of bowel control? N  Managing your Medications? N  Managing your Finances? N  Housekeeping or managing your Housekeeping? N    Patient Care Team: Sherlene Shams, MD as PCP - General (Internal Medicine) Marinus Maw, MD as PCP - Electrophysiology (Cardiology) Micki Riley, MD as Consulting Physician (Neurology) Levert Feinstein, MD as Consulting Physician (Hematology) Salvadore Farber as Consulting Physician (Cardiology)  Indicate any recent Medical Services you may have received from other than [facility] providers in the past year (date may be approximate).     Assessment:   This is a routine wellness examination for Kendra [name].  Virtual Visit via Telephone Note  I  connected with  Kendra [name] on 05/20/21 at  9:15 AM EDT by telephone and verified that I am speaking with the correct person using two identifiers.  Persons participating in the virtual visit: patient/Nurse Health Advisor   I discussed the limitations of performing an evaluation and management service by telehealth. The patient expressed understanding and agreed to proceed.We continued and completed visit with audio only.Some vital signs may be absent or patient reported.   Hearing/Vision screen Hearing Screening - Comments:: Patient is able to hear conversational tones without difficulty. No issues reported. Vision Screening - Comments:: Wears reader lenses  They have seen their ophthalmologist in the last 12 months.   Dietary issues and exercise activities discussed: Healthy diet Good water intake Current Exercise Habits: Home exercise routine, Type of exercise: walking (swimming/water aerobics), Time (Minutes): 60, Frequency (Times/Week): 5, Weekly Exercise (Minutes/Week): 300, Intensity: Mild   Goals Addressed             This Visit's Progress    Follow up with Primary Care Provider       As needed.       Depression Screen    05/20/2021    9:23 AM 05/04/2021    9:42 AM 10/01/2019   10:05 AM 09/30/2018    9:44 AM 03/15/2017   10:16 AM 03/08/2016   10:21 AM 12/14/2015    9:27 AM  PHQ 2/9 Scores  PHQ - 2 Score 0 0 0 0     PHQ- 9 Score            Information is confidential and restricted. Go to Review Flowsheets to unlock data.    Fall Risk    05/20/2021    9:25 AM 05/04/2021    9:42 AM 05/03/2020   10:44 AM 10/01/2019   10:07 AM 04/01/2019    2:38 PM  Fall Risk  Falls in the past year? 0 0 0 0 0  Number falls in past yr: 0   0   Risk for fall due to :  No Fall Risks     Follow up Falls evaluation completed Falls evaluation completed Falls evaluation completed Falls evaluation completed Falls evaluation completed    FALL RISK PREVENTION PERTAINING TO THE HOME: Home  free of loose throw rugs in walkways, pet beds, electrical cords, etc? Yes  Adequate lighting in your home to reduce risk of falls? Yes   ASSISTIVE DEVICES UTILIZED TO PREVENT FALLS: Life alert? No  Use of a cane, walker or w/c? No   TIMED UP AND GO: Was the test performed? No .   Cognitive Function: Patient is alert and oriented x3.     10/01/2019   10:08 AM  MMSE - Mini Mental State Exam  Not completed: Unable to complete        05/20/2021    9:29 AM 09/30/2018    9:45 AM  6CIT Screen  What Year?  0 points  What month?  0 points  What time?  0 points  Count back from 20  0 points  Months in reverse 0 points 0 points  Repeat phrase  0 points  Total Score  0 points    Immunizations Immunization History  Administered Date(s) Administered   Influenza Split 11/28/2014   Influenza Whole 11/27/2008   Influenza, High Dose Seasonal PF 11/10/2015   Influenza, Quadrivalent, Recombinant, Inj, Pf 12/31/2019   Influenza, Seasonal, Injecte, Preservative Fre 11/14/2007   Influenza-Unspecified 12/12/2016, 11/11/2017, 12/12/2018, 01/02/2021   Moderna Sars-Covid-2 Vaccination 03/14/2019, 04/11/2019, 12/23/2019   Pneumococcal Conjugate-13 04/21/2014   Pneumococcal Polysaccharide-23 11/27/2008, 06/21/2010, 03/15/2017   Td 02/27/2002   Tdap 12/11/2013, 04/26/2014   Zoster Recombinat (Shingrix) 11/11/2017, 05/01/2018   Zoster, Live 04/26/2005, 06/20/2006   Screening Tests Health Maintenance  Topic Date Due   COVID-19 Vaccine (4 - Booster for Moderna series) 05/20/2021 (Originally 02/17/2020)   MAMMOGRAM  03/30/2022   TETANUS/TDAP  04/26/2024   Pneumonia Vaccine 34+ Years old  Completed   INFLUENZA VACCINE  Completed   DEXA SCAN  Completed   Hepatitis C Screening  Completed   Zoster Vaccines- Shingrix  Completed   HPV VACCINES  Aged Out   COLONOSCOPY (Pts 45-71yrs Insurance coverage will need to be confirmed)  Discontinued   Health Maintenance There are no preventive care  reminders to display for this patient.  Lung Cancer Screening: (Low Dose CT Chest recommended if Age 79-80 years, 30 pack-year currently smoking OR have quit w/in 15years.) does not qualify.   Hepatitis C Screening:  Completed   Vision Screening: Recommended annual ophthalmology exams for early detection of glaucoma and other disorders of the eye.  Dental Screening: Recommended annual dental exams for proper oral hygiene  Community Resource Referral / Chronic Care Management: CRR required this visit?  No   CCM required this visit?  No      Plan:   Keep all routine maintenance appointments.   I have personally reviewed and noted the following in the patient's chart:   Medical and social history Use of alcohol, tobacco or illicit drugs  Current medications and supplements including opioid prescriptions.  Functional ability and status Nutritional status Physical activity Advanced directives List of other physicians Hospitalizations, surgeries, and ER visits in previous 12 months Vitals Screenings to include cognitive, depression, and falls Referrals and appointments  In addition, I have reviewed and discussed with patient certain preventive protocols,  quality metrics, and best practice recommendations. A written personalized care plan for preventive services as well as general preventive health recommendations were provided to patient.     OBrien-Blaney, Acea Yagi L, LPN   1/61/0960    I have reviewed the above information and agree with above.   Duncan Dull, MD

## 2021-05-29 ENCOUNTER — Other Ambulatory Visit: Payer: Self-pay | Admitting: Internal Medicine

## 2021-05-31 ENCOUNTER — Ambulatory Visit (INDEPENDENT_AMBULATORY_CARE_PROVIDER_SITE_OTHER): Payer: Medicare Other

## 2021-05-31 DIAGNOSIS — Q2112 Patent foramen ovale: Secondary | ICD-10-CM | POA: Diagnosis not present

## 2021-05-31 DIAGNOSIS — I480 Paroxysmal atrial fibrillation: Secondary | ICD-10-CM | POA: Diagnosis not present

## 2021-05-31 DIAGNOSIS — Z5181 Encounter for therapeutic drug level monitoring: Secondary | ICD-10-CM

## 2021-05-31 LAB — POCT INR: INR: 3.3 — AB (ref 2.0–3.0)

## 2021-05-31 NOTE — Patient Instructions (Signed)
Description   ?Spoke with patient advised to continue on same dosage of Warfarin '5mg'$  daily except 2.'5mg'$  on Sundays, Tuesdays, and Thursdays. Recheck INR in 2 weeks. Call Coumadin Clinic # [phone] with any changes in medications, if scheduled for any procedures or have any questions.  ?  ?  ?

## 2021-06-03 ENCOUNTER — Other Ambulatory Visit: Payer: Self-pay | Admitting: Internal Medicine

## 2021-06-14 ENCOUNTER — Ambulatory Visit (INDEPENDENT_AMBULATORY_CARE_PROVIDER_SITE_OTHER): Payer: Medicare Other | Admitting: *Deleted

## 2021-06-14 DIAGNOSIS — Z5181 Encounter for therapeutic drug level monitoring: Secondary | ICD-10-CM

## 2021-06-14 DIAGNOSIS — Q2112 Patent foramen ovale: Secondary | ICD-10-CM | POA: Diagnosis not present

## 2021-06-14 DIAGNOSIS — I480 Paroxysmal atrial fibrillation: Secondary | ICD-10-CM | POA: Diagnosis not present

## 2021-06-14 LAB — POCT INR: INR: 3.3 — AB (ref 2.0–3.0)

## 2021-06-14 NOTE — Patient Instructions (Signed)
Description   ?Spoke with patient advised to continue taking Warfarin '5mg'$  daily except 2.'5mg'$  on Sundays, Tuesdays, and Thursdays. Recheck INR in 2 weeks. Call Coumadin Clinic # [phone] with any changes in medications, if scheduled for any procedures or have any questions.  ?  ?  ?

## 2021-06-17 ENCOUNTER — Ambulatory Visit: Payer: Medicare Other | Admitting: Internal Medicine

## 2021-06-21 ENCOUNTER — Encounter: Payer: Self-pay | Admitting: Internal Medicine

## 2021-06-21 ENCOUNTER — Ambulatory Visit (INDEPENDENT_AMBULATORY_CARE_PROVIDER_SITE_OTHER): Payer: Medicare Other | Admitting: Internal Medicine

## 2021-06-21 VITALS — BP 128/70 | HR 66 | Ht 68.0 in | Wt 156.0 lb

## 2021-06-21 DIAGNOSIS — I10 Essential (primary) hypertension: Secondary | ICD-10-CM

## 2021-06-21 DIAGNOSIS — I480 Paroxysmal atrial fibrillation: Secondary | ICD-10-CM | POA: Diagnosis not present

## 2021-06-21 NOTE — Patient Instructions (Signed)
Medication Instructions:  ?Your physician recommends that you continue on your current medications as directed. Please refer to the Current Medication list given to you today. ? ?Labwork: ?None ordered. ? ?Testing/Procedures: ?None ordered. ? ?Follow-Up: ? ?Your physician wants you to follow-up in: one year with Gregg Taylor, MD or one of the following Advanced Practice Providers on your designated Care Team:   ?Renee Ursuy, PA-C ?Michael "Andy" Tillery, PA-C ? ? ?Any Other Special Instructions Will Be Listed Below (If Applicable). ? ?If you need a refill on your cardiac medications before your next appointment, please call your pharmacy.  ? ?Important Information About Sugar ? ? ? ? ? ? ? ?

## 2021-06-21 NOTE — Progress Notes (Signed)
? ? ? ? ?HPI ?Mrs. Doetsch returns today for followup. She is a pleasant 76 yo woman with a h/o HTN, remote atrial fib and flutter, recurrent thromboembolic events usually when her INR was subtherapeutic or once when she forgot her pradaxa. She has done well in the interim except for some dyspnea. She underwent bunion surgery and she had a  long convalescence. She has been slow to improve, however she notes that she is gradually getting better. She gets tired with exertion. She denies chest pain or sob or edema. ?Allergies  ?Allergen Reactions  ? Amiodarone Hcl Nausea Only and Rash  ? ? ? ?Current Outpatient Medications  ?Medication Sig Dispense Refill  ? alendronate (FOSAMAX) 70 MG tablet TAKE 1 TABLET EVERY 7 DAYS WITH A FULL GLASS WATER ON AN EMPTY STOMACH 12 tablet 2  ? aspirin 81 MG tablet Take 81 mg by mouth daily.    ? cholecalciferol (VITAMIN D) 1000 units tablet Take 1,000 Units by mouth daily.    ? furosemide (LASIX) 20 MG tablet TAKE 1 TABLET BY MOUTH EVERY DAY 90 tablet 3  ? losartan (COZAAR) 25 MG tablet TAKE 1 TABLET (25 MG TOTAL) BY MOUTH DAILY. PLEASE CONTACT OFFICE FOR ADDITIONAL REFILLS 90 tablet 0  ? Magnesium 100 MG TABS Take 100 mg by mouth every other day.    ? metoprolol succinate (TOPROL-XL) 100 MG 24 hr tablet Take 1 tablet (100 mg total) by mouth daily. With or after a meal 90 tablet 0  ? Multiple Vitamins-Minerals (MULTIVITAMIN WITH MINERALS) tablet Take 1 tablet by mouth daily.    ? predniSONE (DELTASONE) 10 MG tablet 6 tablets on Day 1 , then reduce by 1 tablet daily until gone 21 tablet 0  ? rosuvastatin (CRESTOR) 5 MG tablet TAKE 1 TABLET BY MOUTH EVERY DAY 90 tablet 3  ? traZODone (DESYREL) 50 MG tablet TAKE 0.5-1 TABLETS BY MOUTH AT BEDTIME AS NEEDED FOR SLEEP. 90 tablet 2  ? warfarin (COUMADIN) 5 MG tablet TAKE 1/2 TO 1 TABLET BY MOUTH DAILY AS DIRECTED BY THE COUMADIN CLINIC 90 tablet 1  ? ?No current facility-administered medications for this visit.  ? ? ? ?Past Medical History:   ?Diagnosis Date  ? Age related osteoporosis   ? Arthus phenomenon   ? Atrial fibrillation ([place])   ? Atrial flutter ([place])   ? B12 deficiency   ? 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 Chronic anticoagulation 12/14/2015  ? Congenital factor VIII disorder ([place]) 04/12/2015  ? Factor VIII deficiency ([place])   ? GERD (gastroesophageal reflux disease)   ? Hemorrhoids   ? History of kidney stones   ? HTN (hypertension)   ? Hx of blood clots   ? Hypercholesterolemia   ? Ischemia and infarction of kidney ([place]) 05/03/2020  ? Kidney infarction [facility])   ? PFO (patent foramen ovale)   ? Secondary erythrocytosis   ? Secundum ASD   ? Stroke [facility]) 01/2015  ? TIA (transient ischemic attack)   ? as per 07/30/00 note from [place]   ? Vitamin D deficiency   ? ? ?ROS: ? ? All systems reviewed and negative except as noted in the HPI. ? ? ?Past Surgical History:  ?Procedure Laterality Date  ? APPENDECTOMY  1966  ? BREAST BIOPSY Right 1991  ? CARDIAC ELECTROPHYSIOLOGY STUDY AND ABLATION  2002  ? CATARACT EXTRACTION W/ INTRAOCULAR LENS  IMPLANT, BILATERAL Bilateral   ? CESAREAN SECTION  1981  ? fibroid tumor removal  1996  ? [place] Right 04/16/2020  ? Procedure: HALLUX VALGUS LAPIDUS;  Surgeon: Samara Deist, DPM;  Location: [facility] ORS;  Service: Podiatry;  Laterality: Right;  ? RADIOLOGY WITH ANESTHESIA N/A 02/16/2015  ? Procedure: RADIOLOGY WITH ANESTHESIA;  Surgeon: Medication Radiologist, MD;  Location: [place] NEURO ORS;  Service: Radiology;  Laterality: N/A;  ? TEE WITHOUT CARDIOVERSION N/A 03/08/2015  ? Procedure: TRANSESOPHAGEAL ECHOCARDIOGRAM (TEE);  Surgeon: [name], MD;  Location: [place];  Service: Cardiovascular;  Laterality: N/A;  ? TEE WITHOUT CARDIOVERSION N/A 04/28/2015  ? Procedure: TRANSESOPHAGEAL ECHOCARDIOGRAM (TEE);  Surgeon: [name], MD;  Location: [place];  Service: Cardiovascular;  Laterality: N/A;  ? ? ? ?Family History  ?Problem Relation Age of Onset  ? Stroke  Maternal Grandmother   ? Alcohol abuse Father   ? Lung cancer Father   ? Other Brother   ?     Emotional Illness  ? Lung cancer Brother   ? Colon cancer Neg Hx   ? ? ? ?Social History  ? ?Socioeconomic History  ? Marital status: Married  ?  Spouse name: Jeneen Rinks  ? Number of children: 4  ? Years of education: Not on file  ? Highest education level: Not on file  ?Occupational History  ? 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 Smoking status: Never  ? Smokeless tobacco: Never  ?Vaping Use  ? Vaping Use: Never used  ?Substance and Sexual Activity  ? Alcohol use: No  ?  Alcohol/week: 0.0 standard drinks  ? Drug use: No  ? Sexual activity: Not on file  ?Other Topics Concern  ? Not on file  ?Social History Narrative  ? ** Merged History Encounter **  ?    ? Lives with husband  ? No caffeine drinks   ? ?Social Determinants of Health  ? ?Financial Resource Strain: Low Risk   ? Difficulty of Paying Living Expenses: Not hard at all  ?Food Insecurity: No Food Insecurity  ? Worried About Charity fundraiser in the Last Year: Never true  ? Ran Out of Food in the Last Year: Never true  ?Transportation Needs: No Transportation Needs  ? Lack of Transportation (Medical): No  ? Lack of Transportation (Non-Medical): No  ?Physical Activity: Sufficiently Active  ? Days of Exercise per Week: 5 days  ? Minutes of Exercise per Session: 60 min  ?Stress: No Stress Concern Present  ? Feeling of Stress : Not at all  ?Social Connections: Unknown  ? Frequency of Communication with Friends and Family: Not on file  ? Frequency of Social Gatherings with Friends and Family: Not on file  ? Attends Religious Services: Not on file  ? Active Member of Clubs or Organizations: Not on file  ? Attends Archivist Meetings: Not on file  ? Marital Status: Married  ?Intimate Partner Violence: Not At Risk  ? Fear of Current or Ex-Partner: No  ? Emotionally Abused: No  ? Physically Abused: No  ? Sexually Abused: No  ? ? ? ?BP 128/70   Pulse  66   Ht '5\' 8"'$  (1.727 m)   Wt 156 lb (70.8 kg)   SpO2 98%   BMI 23.72 kg/m?  ? ?Physical Exam: ? ?Well appearing NAD ?HEENT: Unremarkable ?Neck:  No JVD, no thyromegally ?Lymphatics:  No adenopathy ?Back:  No CVA tenderness ?Lungs:  Clear with no wheezes ?HEART:  Regular rate rhythm, no murmurs, no rubs, no clicks ?Abd:  soft, positive  bowel sounds, no organomegally, no rebound, no guarding ?Ext:  2 plus pulses, no edema, no cyanosis, no clubbing ?Skin:  No rashes no nodules ?Neuro:  CN II through XII intact, motor grossly intact ? ?EKG - nsr ? ? ?Assess/Plan:  ? ?1. Coags -she will continue systemic anti-coagulation with Warfarin.  ?2. Dyslipidemia - she will continue her crestor at low dose ?3. HTN - her bp is well controlled. No change. ?4. Atrial fib - she has not had any in the last year, at least by her symptoms.  ?5. Fatigue - she has this with exertion. I recommended she continue walking daily and hopefully her symptoms will improve. If not, I would have her wear a 14 day zio monitor and I would also obtain a 2D echo. She will call us if she is not better. ?  ?Carleene Overlie August Longest,MD ?

## 2021-06-28 ENCOUNTER — Ambulatory Visit (INDEPENDENT_AMBULATORY_CARE_PROVIDER_SITE_OTHER): Payer: Medicare Other

## 2021-06-28 DIAGNOSIS — Z5181 Encounter for therapeutic drug level monitoring: Secondary | ICD-10-CM | POA: Diagnosis not present

## 2021-06-28 DIAGNOSIS — Q2112 Patent foramen ovale: Secondary | ICD-10-CM

## 2021-06-28 DIAGNOSIS — I6350 Cerebral infarction due to unspecified occlusion or stenosis of unspecified cerebral artery: Secondary | ICD-10-CM | POA: Diagnosis not present

## 2021-06-28 DIAGNOSIS — I4892 Unspecified atrial flutter: Secondary | ICD-10-CM | POA: Diagnosis not present

## 2021-06-28 DIAGNOSIS — I480 Paroxysmal atrial fibrillation: Secondary | ICD-10-CM

## 2021-06-28 DIAGNOSIS — Z8679 Personal history of other diseases of the circulatory system: Secondary | ICD-10-CM | POA: Diagnosis not present

## 2021-06-28 LAB — POCT INR: INR: 4 — AB (ref 2.0–3.0)

## 2021-06-28 NOTE — Patient Instructions (Signed)
Description   ?Spoke with patient advised to skip today's dosage of Warfarin, then resume same dosage of Warfarin '5mg'$  daily except 2.'5mg'$  on Sundays, Tuesdays, and Thursdays. Recheck INR in 2 weeks. Call Coumadin Clinic # [phone] with any changes in medications, if scheduled for any procedures or have any questions.  ?  ?  ?

## 2021-07-12 ENCOUNTER — Ambulatory Visit (INDEPENDENT_AMBULATORY_CARE_PROVIDER_SITE_OTHER): Payer: Medicare Other | Admitting: *Deleted

## 2021-07-12 DIAGNOSIS — Q2112 Patent foramen ovale: Secondary | ICD-10-CM

## 2021-07-12 DIAGNOSIS — I480 Paroxysmal atrial fibrillation: Secondary | ICD-10-CM

## 2021-07-12 DIAGNOSIS — Z5181 Encounter for therapeutic drug level monitoring: Secondary | ICD-10-CM | POA: Diagnosis not present

## 2021-07-12 LAB — POCT INR: INR: 3.6 — AB (ref 2.0–3.0)

## 2021-07-19 ENCOUNTER — Other Ambulatory Visit: Payer: Self-pay | Admitting: Internal Medicine

## 2021-07-26 ENCOUNTER — Ambulatory Visit (INDEPENDENT_AMBULATORY_CARE_PROVIDER_SITE_OTHER): Payer: Medicare Other | Admitting: Cardiology

## 2021-07-26 DIAGNOSIS — Z5181 Encounter for therapeutic drug level monitoring: Secondary | ICD-10-CM

## 2021-07-26 LAB — POCT INR: INR: 4.2 — AB (ref 2.0–3.0)

## 2021-07-26 NOTE — Patient Instructions (Signed)
Description   Called and spoke to pt and instructed her to:  Hold warfarin today, then START taking warfarin 1/2 a tablet daily except for 1 tablet on Monday, Wednesday and Friday. Recheck INR in 2 weeks. Coumadin Clinic [phone].

## 2021-08-09 ENCOUNTER — Ambulatory Visit (INDEPENDENT_AMBULATORY_CARE_PROVIDER_SITE_OTHER): Payer: Medicare Other | Admitting: *Deleted

## 2021-08-09 DIAGNOSIS — I480 Paroxysmal atrial fibrillation: Secondary | ICD-10-CM

## 2021-08-09 DIAGNOSIS — Z5181 Encounter for therapeutic drug level monitoring: Secondary | ICD-10-CM

## 2021-08-09 DIAGNOSIS — Q2112 Patent foramen ovale: Secondary | ICD-10-CM

## 2021-08-09 LAB — POCT INR: INR: 3.2 — AB (ref 2.0–3.0)

## 2021-08-09 NOTE — Patient Instructions (Signed)
Description   Called and spoke to pt and instructed her to [place] warfarin 1/2 tablet daily except for 1 tablet on Monday, Wednesday and Friday. Recheck INR in 2 weeks. Coumadin Clinic [phone].

## 2021-08-23 ENCOUNTER — Ambulatory Visit (INDEPENDENT_AMBULATORY_CARE_PROVIDER_SITE_OTHER): Payer: Medicare Other | Admitting: Cardiology

## 2021-08-23 DIAGNOSIS — Z5181 Encounter for therapeutic drug level monitoring: Secondary | ICD-10-CM | POA: Diagnosis not present

## 2021-08-23 DIAGNOSIS — I480 Paroxysmal atrial fibrillation: Secondary | ICD-10-CM | POA: Diagnosis not present

## 2021-08-23 DIAGNOSIS — I6350 Cerebral infarction due to unspecified occlusion or stenosis of unspecified cerebral artery: Secondary | ICD-10-CM | POA: Diagnosis not present

## 2021-08-23 DIAGNOSIS — Q2112 Patent foramen ovale: Secondary | ICD-10-CM | POA: Diagnosis not present

## 2021-08-23 DIAGNOSIS — I4892 Unspecified atrial flutter: Secondary | ICD-10-CM | POA: Diagnosis not present

## 2021-08-23 DIAGNOSIS — Z8679 Personal history of other diseases of the circulatory system: Secondary | ICD-10-CM | POA: Diagnosis not present

## 2021-08-23 LAB — POCT INR: INR: 2.9 (ref 2.0–3.0)

## 2021-09-06 ENCOUNTER — Ambulatory Visit (INDEPENDENT_AMBULATORY_CARE_PROVIDER_SITE_OTHER): Payer: Medicare Other | Admitting: *Deleted

## 2021-09-06 DIAGNOSIS — Q2112 Patent foramen ovale: Secondary | ICD-10-CM

## 2021-09-06 DIAGNOSIS — I480 Paroxysmal atrial fibrillation: Secondary | ICD-10-CM | POA: Diagnosis not present

## 2021-09-06 DIAGNOSIS — Z5181 Encounter for therapeutic drug level monitoring: Secondary | ICD-10-CM

## 2021-09-06 LAB — POCT INR: INR: 2.4 (ref 2.0–3.0)

## 2021-09-06 NOTE — Patient Instructions (Signed)
Description   Spoke with pt and instructed pt to take 1 tablet today then continue taking warfarin 1/2 tablet daily except for 1 tablet on Monday, Wednesday and Friday. Recheck INR in 2 weeks. Coumadin Clinic [phone].

## 2021-09-20 ENCOUNTER — Ambulatory Visit (INDEPENDENT_AMBULATORY_CARE_PROVIDER_SITE_OTHER): Payer: Medicare Other | Admitting: Cardiovascular Disease

## 2021-09-20 DIAGNOSIS — Q2112 Patent foramen ovale: Secondary | ICD-10-CM

## 2021-09-20 DIAGNOSIS — Z5181 Encounter for therapeutic drug level monitoring: Secondary | ICD-10-CM | POA: Diagnosis not present

## 2021-09-20 DIAGNOSIS — I480 Paroxysmal atrial fibrillation: Secondary | ICD-10-CM

## 2021-09-20 LAB — POCT INR: INR: 3.1 — AB (ref 2.0–3.0)

## 2021-10-04 ENCOUNTER — Ambulatory Visit (INDEPENDENT_AMBULATORY_CARE_PROVIDER_SITE_OTHER): Payer: Medicare Other | Admitting: Cardiology

## 2021-10-04 ENCOUNTER — Telehealth: Payer: Self-pay

## 2021-10-04 DIAGNOSIS — Q2112 Patent foramen ovale: Secondary | ICD-10-CM | POA: Diagnosis not present

## 2021-10-04 DIAGNOSIS — I480 Paroxysmal atrial fibrillation: Secondary | ICD-10-CM

## 2021-10-04 DIAGNOSIS — Z5181 Encounter for therapeutic drug level monitoring: Secondary | ICD-10-CM

## 2021-10-04 DIAGNOSIS — R0981 Nasal congestion: Secondary | ICD-10-CM | POA: Diagnosis not present

## 2021-10-04 DIAGNOSIS — R0989 Other specified symptoms and signs involving the circulatory and respiratory systems: Secondary | ICD-10-CM | POA: Diagnosis not present

## 2021-10-04 DIAGNOSIS — R5383 Other fatigue: Secondary | ICD-10-CM | POA: Diagnosis not present

## 2021-10-04 DIAGNOSIS — N3001 Acute cystitis with hematuria: Secondary | ICD-10-CM | POA: Diagnosis not present

## 2021-10-04 DIAGNOSIS — U071 COVID-19: Secondary | ICD-10-CM | POA: Diagnosis not present

## 2021-10-04 DIAGNOSIS — Z6823 Body mass index (BMI) 23.0-23.9, adult: Secondary | ICD-10-CM | POA: Diagnosis not present

## 2021-10-04 LAB — POCT INR: INR: 2.6 (ref 2.0–3.0)

## 2021-10-04 NOTE — Telephone Encounter (Signed)
Lpmtcb and discuss INR result. °

## 2021-10-18 ENCOUNTER — Ambulatory Visit (INDEPENDENT_AMBULATORY_CARE_PROVIDER_SITE_OTHER): Payer: Medicare Other | Admitting: *Deleted

## 2021-10-18 DIAGNOSIS — Z5181 Encounter for therapeutic drug level monitoring: Secondary | ICD-10-CM | POA: Diagnosis not present

## 2021-10-18 DIAGNOSIS — Z8679 Personal history of other diseases of the circulatory system: Secondary | ICD-10-CM | POA: Diagnosis not present

## 2021-10-18 DIAGNOSIS — Q2112 Patent foramen ovale: Secondary | ICD-10-CM | POA: Diagnosis not present

## 2021-10-18 DIAGNOSIS — I480 Paroxysmal atrial fibrillation: Secondary | ICD-10-CM

## 2021-10-18 DIAGNOSIS — I6350 Cerebral infarction due to unspecified occlusion or stenosis of unspecified cerebral artery: Secondary | ICD-10-CM | POA: Diagnosis not present

## 2021-10-18 DIAGNOSIS — I4892 Unspecified atrial flutter: Secondary | ICD-10-CM | POA: Diagnosis not present

## 2021-10-18 LAB — POCT INR: INR: 2.9 (ref 2.0–3.0)

## 2021-10-18 NOTE — Patient Instructions (Signed)
Description   Spoke with pt and instructed pt to continue taking warfarin 1/2 tablet daily except for 1 tablet on Monday, Wednesday and Friday. Recheck INR in 2 weeks. Coumadin Clinic [phone].

## 2021-11-01 ENCOUNTER — Ambulatory Visit (INDEPENDENT_AMBULATORY_CARE_PROVIDER_SITE_OTHER): Payer: Medicare Other | Admitting: Cardiology

## 2021-11-01 DIAGNOSIS — Q2112 Patent foramen ovale: Secondary | ICD-10-CM | POA: Diagnosis not present

## 2021-11-01 DIAGNOSIS — Z5181 Encounter for therapeutic drug level monitoring: Secondary | ICD-10-CM | POA: Diagnosis not present

## 2021-11-01 DIAGNOSIS — I480 Paroxysmal atrial fibrillation: Secondary | ICD-10-CM

## 2021-11-01 LAB — POCT INR: INR: 2.8 (ref 2.0–3.0)

## 2021-11-15 ENCOUNTER — Ambulatory Visit (INDEPENDENT_AMBULATORY_CARE_PROVIDER_SITE_OTHER): Payer: Medicare Other | Admitting: *Deleted

## 2021-11-15 DIAGNOSIS — I480 Paroxysmal atrial fibrillation: Secondary | ICD-10-CM

## 2021-11-15 DIAGNOSIS — Z5181 Encounter for therapeutic drug level monitoring: Secondary | ICD-10-CM

## 2021-11-15 DIAGNOSIS — Q2112 Patent foramen ovale: Secondary | ICD-10-CM | POA: Diagnosis not present

## 2021-11-15 LAB — POCT INR: INR: 3.3 — AB (ref 2.0–3.0)

## 2021-11-21 DIAGNOSIS — Z23 Encounter for immunization: Secondary | ICD-10-CM | POA: Diagnosis not present

## 2021-11-29 ENCOUNTER — Telehealth: Payer: Self-pay | Admitting: *Deleted

## 2021-11-29 ENCOUNTER — Ambulatory Visit (INDEPENDENT_AMBULATORY_CARE_PROVIDER_SITE_OTHER): Payer: Medicare Other | Admitting: *Deleted

## 2021-11-29 DIAGNOSIS — I480 Paroxysmal atrial fibrillation: Secondary | ICD-10-CM | POA: Diagnosis not present

## 2021-11-29 DIAGNOSIS — Q2112 Patent foramen ovale: Secondary | ICD-10-CM

## 2021-11-29 DIAGNOSIS — Z5181 Encounter for therapeutic drug level monitoring: Secondary | ICD-10-CM

## 2021-11-29 LAB — POCT INR: INR: 3.8 — AB (ref 2.0–3.0)

## 2021-11-29 NOTE — Telephone Encounter (Signed)
Called pt since his INR is due; left a message to perform result and submit to company or call us back.

## 2021-11-29 NOTE — Patient Instructions (Signed)
Description   Spoke with pt and instructed pt to hold today's dose then continue taking warfarin 1/2 tablet daily except for 1 tablet on Monday, Wednesday and Friday. Recheck INR in 2 weeks. Coumadin Clinic [phone].

## 2021-12-13 ENCOUNTER — Telehealth: Payer: Self-pay

## 2021-12-13 ENCOUNTER — Ambulatory Visit (INDEPENDENT_AMBULATORY_CARE_PROVIDER_SITE_OTHER): Payer: Medicare Other | Admitting: *Deleted

## 2021-12-13 DIAGNOSIS — Z5181 Encounter for therapeutic drug level monitoring: Secondary | ICD-10-CM | POA: Diagnosis not present

## 2021-12-13 DIAGNOSIS — I480 Paroxysmal atrial fibrillation: Secondary | ICD-10-CM | POA: Diagnosis not present

## 2021-12-13 DIAGNOSIS — Q2112 Patent foramen ovale: Secondary | ICD-10-CM

## 2021-12-13 LAB — POCT INR: INR: 3.1 — AB (ref 2.0–3.0)

## 2021-12-13 NOTE — Telephone Encounter (Signed)
Lpm to check INR 

## 2021-12-16 ENCOUNTER — Other Ambulatory Visit: Payer: Self-pay | Admitting: Internal Medicine

## 2021-12-27 ENCOUNTER — Ambulatory Visit (INDEPENDENT_AMBULATORY_CARE_PROVIDER_SITE_OTHER): Payer: Medicare Other

## 2021-12-27 DIAGNOSIS — I480 Paroxysmal atrial fibrillation: Secondary | ICD-10-CM

## 2021-12-27 DIAGNOSIS — Q2112 Patent foramen ovale: Secondary | ICD-10-CM

## 2021-12-27 DIAGNOSIS — Z5181 Encounter for therapeutic drug level monitoring: Secondary | ICD-10-CM | POA: Diagnosis not present

## 2021-12-27 LAB — POCT INR: INR: 3.1 — AB (ref 2.0–3.0)

## 2021-12-27 NOTE — Patient Instructions (Signed)
Description   Spoke with pt and instructed pt to then continue taking warfarin 1/2 tablet daily except for 1 tablet on Monday, Wednesday and Friday.  Recheck INR in 2 weeks. Coumadin Clinic [phone]

## 2022-01-10 ENCOUNTER — Ambulatory Visit (INDEPENDENT_AMBULATORY_CARE_PROVIDER_SITE_OTHER): Payer: Medicare Other | Admitting: *Deleted

## 2022-01-10 ENCOUNTER — Telehealth: Payer: Self-pay

## 2022-01-10 DIAGNOSIS — Z5181 Encounter for therapeutic drug level monitoring: Secondary | ICD-10-CM

## 2022-01-10 DIAGNOSIS — Q2112 Patent foramen ovale: Secondary | ICD-10-CM

## 2022-01-10 DIAGNOSIS — I480 Paroxysmal atrial fibrillation: Secondary | ICD-10-CM

## 2022-01-10 LAB — POCT INR: INR: 2.9 (ref 2.0–3.0)

## 2022-01-10 NOTE — Patient Instructions (Signed)
Description   Spoke with pt and instructed pt to then continue taking warfarin 1/2 tablet daily except for 1 tablet on Monday, Wednesday and Friday. Recheck INR in 2 weeks. [facility] [phone]

## 2022-01-10 NOTE — Telephone Encounter (Signed)
Lpm to check INR 

## 2022-01-23 ENCOUNTER — Other Ambulatory Visit: Payer: Self-pay | Admitting: Cardiovascular Disease

## 2022-01-23 NOTE — Telephone Encounter (Signed)
Refill request for warfarin:  Last INR was 2.9 on 01/10/22 Next INR due 01/24/22 LOV was 06/21/21  Beckie Salts MD  Refill approved.

## 2022-01-24 ENCOUNTER — Telehealth: Payer: Self-pay

## 2022-01-24 ENCOUNTER — Ambulatory Visit (INDEPENDENT_AMBULATORY_CARE_PROVIDER_SITE_OTHER): Payer: Medicare Other | Admitting: *Deleted

## 2022-01-24 DIAGNOSIS — Q2112 Patent foramen ovale: Secondary | ICD-10-CM | POA: Diagnosis not present

## 2022-01-24 DIAGNOSIS — Z5181 Encounter for therapeutic drug level monitoring: Secondary | ICD-10-CM

## 2022-01-24 DIAGNOSIS — I480 Paroxysmal atrial fibrillation: Secondary | ICD-10-CM

## 2022-01-24 LAB — POCT INR: INR: 2.9 (ref 2.0–3.0)

## 2022-01-24 NOTE — Patient Instructions (Signed)
Description   Spoke with pt and instructed pt to then continue taking warfarin 1/2 tablet daily except for 1 tablet on Monday, Wednesday and Friday. Recheck INR in 2 weeks. [facility] [phone]

## 2022-01-24 NOTE — Telephone Encounter (Signed)
Lm to check INR

## 2022-02-02 ENCOUNTER — Other Ambulatory Visit: Payer: Self-pay | Admitting: Internal Medicine

## 2022-02-02 NOTE — Telephone Encounter (Signed)
Rx refill sent to pharmacy. 

## 2022-02-07 ENCOUNTER — Ambulatory Visit (INDEPENDENT_AMBULATORY_CARE_PROVIDER_SITE_OTHER): Payer: Medicare Other | Admitting: Internal Medicine

## 2022-02-07 DIAGNOSIS — Q2112 Patent foramen ovale: Secondary | ICD-10-CM

## 2022-02-07 DIAGNOSIS — I480 Paroxysmal atrial fibrillation: Secondary | ICD-10-CM

## 2022-02-07 DIAGNOSIS — Z5181 Encounter for therapeutic drug level monitoring: Secondary | ICD-10-CM

## 2022-02-07 LAB — POCT INR: INR: 2.5 (ref 2.0–3.0)

## 2022-02-07 NOTE — Telephone Encounter (Signed)
MyChart messgae sent to patient. 

## 2022-02-08 NOTE — Telephone Encounter (Signed)
Error

## 2022-02-22 ENCOUNTER — Ambulatory Visit (INDEPENDENT_AMBULATORY_CARE_PROVIDER_SITE_OTHER): Payer: Medicare Other

## 2022-02-22 DIAGNOSIS — Z5181 Encounter for therapeutic drug level monitoring: Secondary | ICD-10-CM

## 2022-02-22 DIAGNOSIS — I480 Paroxysmal atrial fibrillation: Secondary | ICD-10-CM | POA: Diagnosis not present

## 2022-02-22 DIAGNOSIS — Q2112 Patent foramen ovale: Secondary | ICD-10-CM

## 2022-02-22 LAB — POCT INR: INR: 2.5 (ref 2.0–3.0)

## 2022-02-22 NOTE — Patient Instructions (Signed)
Description   Spoke with pt and instructed to take 1.5 tablets today and then continue taking warfarin 1/2 tablet daily except for 1 tablet on Monday, Wednesday and Friday.  Recheck INR in 2 weeks.  [facility] [phone]

## 2022-03-08 ENCOUNTER — Ambulatory Visit (INDEPENDENT_AMBULATORY_CARE_PROVIDER_SITE_OTHER): Payer: Medicare Other | Admitting: *Deleted

## 2022-03-08 DIAGNOSIS — I480 Paroxysmal atrial fibrillation: Secondary | ICD-10-CM | POA: Diagnosis not present

## 2022-03-08 DIAGNOSIS — Q2112 Patent foramen ovale: Secondary | ICD-10-CM

## 2022-03-08 DIAGNOSIS — Z5181 Encounter for therapeutic drug level monitoring: Secondary | ICD-10-CM

## 2022-03-08 LAB — POCT INR: INR: 2.9 (ref 2.0–3.0)

## 2022-03-08 NOTE — Patient Instructions (Signed)
Description   Spoke with pt and instructed to continue taking warfarin 1/2 tablet daily except for 1 tablet on Monday, Wednesday and Friday.  Recheck INR in 2 weeks.  [facility] [phone]     

## 2022-03-22 ENCOUNTER — Ambulatory Visit (INDEPENDENT_AMBULATORY_CARE_PROVIDER_SITE_OTHER): Payer: Medicare Other

## 2022-03-22 DIAGNOSIS — Z5181 Encounter for therapeutic drug level monitoring: Secondary | ICD-10-CM

## 2022-03-22 LAB — POCT INR: INR: 2.6 (ref 2.0–3.0)

## 2022-03-22 NOTE — Patient Instructions (Signed)
Description   Spoke with pt and instructed to take 1.5 tablets today and then continue taking warfarin 1/2 tablet daily except for 1 tablet on Monday, Wednesday and Friday.  Recheck INR in 2 weeks.  [facility] [phone]

## 2022-03-29 DIAGNOSIS — H52203 Unspecified astigmatism, bilateral: Secondary | ICD-10-CM | POA: Diagnosis not present

## 2022-03-29 DIAGNOSIS — Z961 Presence of intraocular lens: Secondary | ICD-10-CM | POA: Diagnosis not present

## 2022-04-05 ENCOUNTER — Ambulatory Visit (INDEPENDENT_AMBULATORY_CARE_PROVIDER_SITE_OTHER): Payer: Medicare Other

## 2022-04-05 DIAGNOSIS — Z1231 Encounter for screening mammogram for malignant neoplasm of breast: Secondary | ICD-10-CM | POA: Diagnosis not present

## 2022-04-05 DIAGNOSIS — I480 Paroxysmal atrial fibrillation: Secondary | ICD-10-CM | POA: Diagnosis not present

## 2022-04-05 DIAGNOSIS — Z5181 Encounter for therapeutic drug level monitoring: Secondary | ICD-10-CM | POA: Diagnosis not present

## 2022-04-05 LAB — POCT INR: INR: 3.2 — AB (ref 2.0–3.0)

## 2022-04-05 LAB — HM MAMMOGRAPHY

## 2022-04-05 NOTE — Patient Instructions (Signed)
Description   Spoke with pt and instructed to continue taking warfarin 1/2 tablet daily except for 1 tablet on Monday, Wednesday and Friday.  Recheck INR in 2 weeks.  [facility] [phone]     

## 2022-04-19 ENCOUNTER — Ambulatory Visit (INDEPENDENT_AMBULATORY_CARE_PROVIDER_SITE_OTHER): Payer: Medicare Other

## 2022-04-19 DIAGNOSIS — Q2112 Patent foramen ovale: Secondary | ICD-10-CM

## 2022-04-19 DIAGNOSIS — I480 Paroxysmal atrial fibrillation: Secondary | ICD-10-CM | POA: Diagnosis not present

## 2022-04-19 DIAGNOSIS — Z5181 Encounter for therapeutic drug level monitoring: Secondary | ICD-10-CM

## 2022-04-19 LAB — POCT INR: INR: 2.6 (ref 2.0–3.0)

## 2022-04-19 NOTE — Patient Instructions (Signed)
Description   Spoke with pt and instructed to take 1 1/2 tablets today and then continue taking warfarin 1/2 tablet daily except for 1 tablet on Monday, Wednesday and Friday.  Recheck INR in 2 weeks.  [facility] [phone]

## 2022-05-03 ENCOUNTER — Ambulatory Visit (INDEPENDENT_AMBULATORY_CARE_PROVIDER_SITE_OTHER): Payer: Medicare Other | Admitting: *Deleted

## 2022-05-03 DIAGNOSIS — L814 Other melanin hyperpigmentation: Secondary | ICD-10-CM | POA: Diagnosis not present

## 2022-05-03 DIAGNOSIS — D0461 Carcinoma in situ of skin of right upper limb, including shoulder: Secondary | ICD-10-CM | POA: Diagnosis not present

## 2022-05-03 DIAGNOSIS — Q2112 Patent foramen ovale: Secondary | ICD-10-CM

## 2022-05-03 DIAGNOSIS — Z5181 Encounter for therapeutic drug level monitoring: Secondary | ICD-10-CM | POA: Diagnosis not present

## 2022-05-03 DIAGNOSIS — I480 Paroxysmal atrial fibrillation: Secondary | ICD-10-CM | POA: Diagnosis not present

## 2022-05-03 DIAGNOSIS — D225 Melanocytic nevi of trunk: Secondary | ICD-10-CM | POA: Diagnosis not present

## 2022-05-03 DIAGNOSIS — L570 Actinic keratosis: Secondary | ICD-10-CM | POA: Diagnosis not present

## 2022-05-03 DIAGNOSIS — D485 Neoplasm of uncertain behavior of skin: Secondary | ICD-10-CM | POA: Diagnosis not present

## 2022-05-03 DIAGNOSIS — L821 Other seborrheic keratosis: Secondary | ICD-10-CM | POA: Diagnosis not present

## 2022-05-03 DIAGNOSIS — L578 Other skin changes due to chronic exposure to nonionizing radiation: Secondary | ICD-10-CM | POA: Diagnosis not present

## 2022-05-03 LAB — POCT INR: INR: 2.6 (ref 2.0–3.0)

## 2022-05-03 NOTE — Patient Instructions (Signed)
Description   Spoke with pt and instructed to continue taking warfarin 1/2 tablet daily except for 1 tablet on Monday, Wednesday and Friday.  Recheck INR in 2 weeks.  [facility] [phone]

## 2022-05-08 ENCOUNTER — Encounter: Payer: Self-pay | Admitting: Internal Medicine

## 2022-05-08 ENCOUNTER — Ambulatory Visit (INDEPENDENT_AMBULATORY_CARE_PROVIDER_SITE_OTHER): Payer: Medicare Other | Admitting: Internal Medicine

## 2022-05-08 VITALS — BP 132/74 | HR 69 | Temp 97.5°F | Ht 68.0 in | Wt 158.8 lb

## 2022-05-08 DIAGNOSIS — T733XXA Exhaustion due to excessive exertion, initial encounter: Secondary | ICD-10-CM | POA: Diagnosis not present

## 2022-05-08 DIAGNOSIS — I10 Essential (primary) hypertension: Secondary | ICD-10-CM | POA: Diagnosis not present

## 2022-05-08 DIAGNOSIS — E7800 Pure hypercholesterolemia, unspecified: Secondary | ICD-10-CM | POA: Diagnosis not present

## 2022-05-08 DIAGNOSIS — D66 Hereditary factor VIII deficiency: Secondary | ICD-10-CM

## 2022-05-08 DIAGNOSIS — H6121 Impacted cerumen, right ear: Secondary | ICD-10-CM | POA: Diagnosis not present

## 2022-05-08 DIAGNOSIS — M810 Age-related osteoporosis without current pathological fracture: Secondary | ICD-10-CM

## 2022-05-08 DIAGNOSIS — I480 Paroxysmal atrial fibrillation: Secondary | ICD-10-CM

## 2022-05-08 DIAGNOSIS — E559 Vitamin D deficiency, unspecified: Secondary | ICD-10-CM | POA: Diagnosis not present

## 2022-05-08 DIAGNOSIS — R7301 Impaired fasting glucose: Secondary | ICD-10-CM | POA: Diagnosis not present

## 2022-05-08 DIAGNOSIS — D751 Secondary polycythemia: Secondary | ICD-10-CM

## 2022-05-08 DIAGNOSIS — Z7901 Long term (current) use of anticoagulants: Secondary | ICD-10-CM

## 2022-05-08 DIAGNOSIS — Q211 Atrial septal defect, unspecified: Secondary | ICD-10-CM

## 2022-05-08 LAB — COMPREHENSIVE METABOLIC PANEL
ALT: 15 U/L (ref 0–35)
AST: 18 U/L (ref 0–37)
Albumin: 4.2 g/dL (ref 3.5–5.2)
Alkaline Phosphatase: 72 U/L (ref 39–117)
BUN: 16 mg/dL (ref 6–23)
CO2: 28 mEq/L (ref 19–32)
Calcium: 9.7 mg/dL (ref 8.4–10.5)
Chloride: 102 mEq/L (ref 96–112)
Creatinine, Ser: 0.86 mg/dL (ref 0.40–1.20)
GFR: 64.88 mL/min (ref >60.00)
Glucose, Bld: 88 mg/dL (ref 70–99)
Potassium: 3.9 mEq/L (ref 3.5–5.1)
Sodium: 139 mEq/L (ref 135–145)
Total Bilirubin: 0.8 mg/dL (ref 0.2–1.2)
Total Protein: 7.1 g/dL (ref 6.0–8.3)

## 2022-05-08 LAB — LIPID PANEL
Cholesterol: 165 mg/dL (ref 0–200)
HDL: 61.2 mg/dL (ref >39.00)
LDL Cholesterol: 76 mg/dL (ref 0–99)
NonHDL: 103.9
Total CHOL/HDL Ratio: 3
Triglycerides: 141 mg/dL (ref 0.0–149.0)
VLDL: 28.2 mg/dL (ref 0.0–40.0)

## 2022-05-08 LAB — CBC WITH DIFFERENTIAL/PLATELET
Basophils Absolute: 0 10*3/uL (ref 0.0–0.1)
Basophils Relative: 0.6 % (ref 0.0–3.0)
Eosinophils Absolute: 0.1 10*3/uL (ref 0.0–0.7)
Eosinophils Relative: 1.4 % (ref 0.0–5.0)
HCT: 45.6 % (ref 36.0–46.0)
Hemoglobin: 15.9 g/dL — ABNORMAL HIGH (ref 12.0–15.0)
Lymphocytes Relative: 18.5 % (ref 12.0–46.0)
Lymphs Abs: 1.4 10*3/uL (ref 0.7–4.0)
MCHC: 34.8 g/dL (ref 30.0–36.0)
MCV: 91.1 fl (ref 78.0–100.0)
Monocytes Absolute: 0.5 10*3/uL (ref 0.1–1.0)
Monocytes Relative: 7.1 % (ref 3.0–12.0)
Neutro Abs: 5.3 10*3/uL (ref 1.4–7.7)
Neutrophils Relative %: 72.4 % (ref 43.0–77.0)
Platelets: 221 10*3/uL (ref 150.0–400.0)
RBC: 5.01 Mil/uL (ref 3.87–5.11)
RDW: 12.7 % (ref 11.5–15.5)
WBC: 7.4 10*3/uL (ref 4.0–10.5)

## 2022-05-08 LAB — VITAMIN D 25 HYDROXY (VIT D DEFICIENCY, FRACTURES): VITD: 36.12 ng/mL (ref 30.00–100.00)

## 2022-05-08 LAB — LDL CHOLESTEROL, DIRECT: Direct LDL: 81 mg/dL

## 2022-05-08 LAB — HEMOGLOBIN A1C: Hgb A1c MFr Bld: 5.5 % (ref 4.6–6.5)

## 2022-05-08 MED ORDER — TRAZODONE HCL 50 MG PO TABS: ORAL_TABLET | ORAL | 2 refills | Status: DC

## 2022-05-08 NOTE — Assessment & Plan Note (Signed)
Managed with rosuvastatin   Lab Results  Component Value Date   CHOL 165 05/08/2022   HDL 61.20 05/08/2022   LDLCALC 76 05/08/2022   LDLDIRECT 81.0 05/08/2022   TRIG 141.0 05/08/2022   CHOLHDL 3 05/08/2022   Lab Results  Component Value Date   ALT 15 05/08/2022   AST 18 05/08/2022   ALKPHOS 72 05/08/2022   BILITOT 0.8 05/08/2022

## 2022-05-08 NOTE — Assessment & Plan Note (Signed)
Right ear only  (the side she sleeps on).  RN visit scheduled for cleaning

## 2022-05-08 NOTE — Assessment & Plan Note (Addendum)
Managed with alendronate since  2018.  SS increase in density of spine and left hip is noted on   Repeat DEXA April 2022 and now in the steopenia range.  .  Will repeat this year and consider dc alendronate

## 2022-05-08 NOTE — Assessment & Plan Note (Signed)
Elevated levels noted during hypercoag workup.  Several daughters also affected.  Continue coumadin , hematology follow up

## 2022-05-08 NOTE — Assessment & Plan Note (Addendum)
Rechecking level today : normalized.  Continue current level of supplementation

## 2022-05-08 NOTE — Assessment & Plan Note (Signed)
Exam is c/w NSR.  Managed by cardiology with metoprolol and warfarin for embolic stroke risk mitigation/history of CVA

## 2022-05-08 NOTE — Patient Instructions (Addendum)
Your Annual Wellness visit is due around 05/21/2022. Please schedule the appointment at checkout.   I recommend getting the majority of your calcium and Vitamin D  through diet rather than supplements given the recent association of calcium supplements with increased coronary artery calcium scores (You need 1200 mg daily )   Algae Cal can be obtained  milk is a great low calorie low carb, cholesterol free  way to increase your dietary calcium and vitamin D.  Try the blue Diamond  brand   Bone density  will be repeated this year to see if we can stop  alendronate for a year   We have you set up for an ear cleaning Tuesday at 3:45 pm  with our nurse

## 2022-05-08 NOTE — Assessment & Plan Note (Signed)
Patient has small ASD. Followed by Dr. Burt Knack

## 2022-05-08 NOTE — Progress Notes (Signed)
Patient ID: Kendra [name], female    DOB: Feb 24, 1946  Age: 77 y.o. MRN: [id number]  The patient is here for follow up and  management of other chronic and acute problems.   The risk factors are reflected in the social history.  The roster of all physicians providing medical care to patient - is listed in the Snapshot section of the chart.  Activities of daily living:  The patient is 100% independent in all ADLs: dressing, toileting, feeding as well as independent mobility  Home safety : The patient has smoke detectors in the home. They wear seatbelts.  There are no firearms at home. There is no violence in the home.   There is no risks for hepatitis, STDs or HIV. There is no   history of blood transfusion. They have no travel history to infectious disease endemic areas of the world.  The patient has seen their dentist in the last six month. They have seen their eye doctor in the last year. They admit to slight hearing difficulty with regard to whispered voices and some television programs.  They have deferred audiologic testing in the last year.  They do not  have excessive sun exposure. Discussed the need for sun protection: hats, long sleeves and use of sunscreen if there is significant sun exposure.   Diet: the importance of a healthy diet is discussed. They do have a healthy diet.  The benefits of regular aerobic exercise were discussed. She walks 4 times per week ,  20 minutes.   Depression screen: there are no signs or vegative symptoms of depression- irritability, change in appetite, anhedonia, sadness/tearfullness.  Cognitive assessment: the patient manages all their financial and personal affairs and is actively engaged. They could relate day,date,year and events; recalled 2/3 objects at 3 minutes; performed clock-face test normally.  The following portions of the patient's history were reviewed and updated as appropriate: allergies, current medications, past family history, past  medical history,  past surgical history, past social history  and problem list.  Visual acuity was not assessed per patient preference since she has regular follow up with her ophthalmologist. Hearing and body mass index were assessed and reviewed.   During the course of the visit the patient was educated and counseled about appropriate screening and preventive services including : fall prevention , diabetes screening, nutrition counseling, colorectal cancer screening, and recommended immunizations.    CC: The primary encounter diagnosis was Essential hypertension. 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  1) RECENT SKIN BIOPSY RIGHT SIDE OF NOSE BY Dr Merilynn Finland in [place].  Awaiting word re Mohs procedure 2) loss of hearing on right,  wants ear checked  3) energy level is fine.  Hs started gardening  4) sleeping well without trazodone. Showering instead.  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   She has a past surgical history that includes Cesarean section (1981); fibroid tumor removal (1996); Cardiac electrophysiology study and ablation (2002); Radiology with anesthesia (N/A, 02/16/2015); TEE without cardioversion (N/A, 03/08/2015); TEE without cardioversion (N/A, 04/28/2015); Breast biopsy (Right, 1991); Appendectomy (1966); Cataract extraction w/ intraocular lens  implant, bilateral (Bilateral); and Hallux valgus lapidus (Right, 04/16/2020).   Her family history includes Alcohol abuse in her father; Lung cancer in her brother and father; Other in her brother; Stroke in her maternal grandmother.She reports that she has never smoked. She has never used smokeless tobacco. She reports that she does not drink alcohol and does not use drugs.  Outpatient Medications Prior to Visit  Medication Sig Dispense Refill   alendronate (FOSAMAX) 70 MG tablet TAKE 1 TABLET EVERY 7 DAYS WITH A FULL GLASS WATER ON AN EMPTY STOMACH 12 tablet 2   aspirin 81 MG tablet Take 81 mg by mouth daily.     cholecalciferol (VITAMIN D) 1000 units tablet Take 1,000 Units by mouth daily.     furosemide (LASIX) 20 MG tablet TAKE 1 TABLET BY MOUTH EVERY DAY 90 tablet 0   losartan (COZAAR) 25 MG tablet Take 1 tablet (25 mg total) by mouth daily. 90 tablet 2   metoprolol succinate (TOPROL-XL) 100 MG 24 hr tablet TAKE 1 TABLET (100 MG TOTAL) BY MOUTH DAILY. WITH OR AFTER A MEAL 90 tablet 2   Multiple Vitamins-Minerals (MULTIVITAMIN WITH MINERALS) tablet Take 1 tablet by mouth daily.     rosuvastatin (CRESTOR) 5 MG tablet TAKE 1 TABLET BY MOUTH EVERY DAY 90 tablet 3   warfarin (COUMADIN) 5 MG tablet TAKE 1/2 TO 1 TABLET BY MOUTH DAILY AS DIRECTED BY THE COUMADIN CLINIC 90 tablet 1   traZODone (DESYREL) 50 MG tablet TAKE 0.5-1 TABLETS BY MOUTH AT BEDTIME AS NEEDED FOR SLEEP. 90 tablet 2   Magnesium 100 MG TABS Take 100  mg by mouth every other day. (Patient not taking: Reported on 05/08/2022)     predniSONE (DELTASONE) 10 MG tablet 6 tablets on Day 1 , then reduce by 1 tablet daily until gone (Patient not taking: Reported on 05/08/2022) 21 tablet 0   No facility-administered medications prior to visit.    Review of Systems  Patient denies headache, fevers, malaise, unintentional weight loss, skin rash, eye pain, sinus congestion and sinus pain, sore throat, dysphagia,  hemoptysis , cough, dyspnea, wheezing, chest pain, palpitations, orthopnea, edema, abdominal pain, nausea, melena, diarrhea, constipation, flank pain, dysuria, hematuria, urinary  Frequency, nocturia, numbness, tingling, seizures,  Focal weakness, Loss of consciousness,  Tremor, insomnia, depression, anxiety, and suicidal ideation.    Objective:  BP 132/74   Pulse 69   Temp (!) 97.5 F (36.4 C) (Oral)   Ht '5\' 8"'$  (1.727 m)   Wt 158 lb 12.8 oz (72 kg)   SpO2 98%   BMI 24.15 kg/m   Physical Exam Vitals reviewed.  Constitutional:      General: She is not in acute distress.    Appearance: Normal appearance. She is normal weight. She is not ill-appearing, toxic-appearing or diaphoretic.  HENT:     Head: Normocephalic.  Eyes:     General: No scleral icterus.       Right eye: No discharge.        Left eye: No discharge.  Conjunctiva/sclera: Conjunctivae normal.  Cardiovascular:     Rate and Rhythm: Normal rate and regular rhythm.     Heart sounds: Normal heart sounds.  Pulmonary:     Effort: Pulmonary effort is normal. No respiratory distress.     Breath sounds: Normal breath sounds.  Musculoskeletal:        General: Normal range of motion.  Skin:    General: Skin is warm and dry.  Neurological:     General: No focal deficit present.     Mental Status: She is alert and oriented to person, place, and time. Mental status is at baseline.  Psychiatric:        Mood and Affect: Mood normal.        Behavior: Behavior normal.         Thought Content: Thought content normal.        Judgment: Judgment normal.     Assessment & Plan:  Essential hypertension Assessment & Plan: Well controlled on current regimen. Renal function is due and has been historically  stable, no changes today.  Lab Results  Component Value Date   CREATININE 0.86 05/08/2022   Lab Results  Component Value Date   NA 139 05/08/2022   K 3.9 05/08/2022   CL 102 05/08/2022   CO2 28 05/08/2022     Orders: -     Comprehensive metabolic panel -     Microalbumin / creatinine urine ratio; Future  PURE HYPERCHOLESTEROLEMIA Assessment & Plan: Managed with rosuvastatin   Lab Results  Component Value Date   CHOL 165 05/08/2022   HDL 61.20 05/08/2022   LDLCALC 76 05/08/2022   LDLDIRECT 81.0 05/08/2022   TRIG 141.0 05/08/2022   CHOLHDL 3 05/08/2022   Lab Results  Component Value Date   ALT 15 05/08/2022   AST 18 05/08/2022   ALKPHOS 72 05/08/2022   BILITOT 0.8 05/08/2022     Orders: -     Lipid panel -     LDL cholesterol, direct  Fatigue due to excessive exertion, initial encounter -     CBC with Differential/Platelet  Impaired fasting glucose -     Comprehensive metabolic panel -     Hemoglobin A1c  Age-related osteoporosis without current pathological fracture Assessment & Plan: Managed with alendronate since  2018.  SS increase in density of spine and left hip is noted on   Repeat DEXA April 2022 and now in the steopenia range.  .  Will repeat this year and consider dc alendronate   Orders: -     DG Bone Density; Future  Vitamin D deficiency Assessment & Plan: Rechecking level today : normalized.  Continue current level of supplementation   Orders: -     VITAMIN D 25 Hydroxy (Vit-D Deficiency, Fractures)  Impacted cerumen of right ear Assessment & Plan: Right ear only  (the side she sleeps on).  RN visit scheduled for cleaning    Chronic anticoagulation Assessment & Plan: Reminded of the effect of antibiotics  on INR.  She checks every 2 weeks at home   ASD (atrial septal defect) Assessment & Plan:  Patient has small ASD. Followed by Dr. Burt Knack   Paroxysmal atrial fibrillation [facility]) Assessment & Plan: Exam is c/w NSR.  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  Several daughters also affected.  Continue coumadin , hematology follow up  I provided 40 minutes of  face-to-face time during this encounter reviewing patient's current problems and past surgeries,  recent labs and imaging studies, providing counseling on the above mentioned problems , and coordination  of care .   Follow-up: Return in about 1 year (around 05/08/2023).   Crecencio Mc, MD

## 2022-05-08 NOTE — Assessment & Plan Note (Signed)
Well controlled on current regimen. Renal function is due and has been historically  stable, no changes today.  Lab Results  Component Value Date   CREATININE 0.86 05/08/2022   Lab Results  Component Value Date   NA 139 05/08/2022   K 3.9 05/08/2022   CL 102 05/08/2022   CO2 28 05/08/2022

## 2022-05-08 NOTE — Assessment & Plan Note (Signed)
Reminded of the effect of antibiotics on INR.  She checks every 2 weeks at home

## 2022-05-09 ENCOUNTER — Ambulatory Visit (INDEPENDENT_AMBULATORY_CARE_PROVIDER_SITE_OTHER): Payer: Medicare Other

## 2022-05-09 DIAGNOSIS — H6121 Impacted cerumen, right ear: Secondary | ICD-10-CM | POA: Diagnosis not present

## 2022-05-09 NOTE — Progress Notes (Signed)
Patient arrived to have her right ear irrigated. Right ear was irrigated and out came a big chunk of wax. Dr. Derrel Nip checked her right ear to ensure that it was clear.

## 2022-05-10 LAB — MICROALBUMIN / CREATININE URINE RATIO
Creatinine,U: 21.1 mg/dL
Microalb Creat Ratio: 3.3 mg/g (ref 0.0–30.0)
Microalb, Ur: <0.7 mg/dL (ref 0.0–1.9)

## 2022-05-11 NOTE — Addendum Note (Signed)
Addended by: Crecencio Mc on: 05/11/2022 03:08 PM   Modules accepted: Orders

## 2022-05-17 ENCOUNTER — Ambulatory Visit (INDEPENDENT_AMBULATORY_CARE_PROVIDER_SITE_OTHER): Payer: Medicare Other

## 2022-05-17 DIAGNOSIS — Q2112 Patent foramen ovale: Secondary | ICD-10-CM

## 2022-05-17 DIAGNOSIS — I480 Paroxysmal atrial fibrillation: Secondary | ICD-10-CM | POA: Diagnosis not present

## 2022-05-17 DIAGNOSIS — Z5181 Encounter for therapeutic drug level monitoring: Secondary | ICD-10-CM | POA: Diagnosis not present

## 2022-05-17 LAB — POCT INR: INR: 2.4 (ref 2.0–3.0)

## 2022-05-17 NOTE — Patient Instructions (Signed)
Description   Spoke with pt and instructed to take 1.5 tablets today, then resume same dosage of Warfarin 1/2 tablet daily except for 1 tablet on Mondays, Wednesdays and Fridays.  Recheck INR in 2 weeks.  [facility] [phone]

## 2022-05-19 ENCOUNTER — Telehealth: Payer: Self-pay | Admitting: Internal Medicine

## 2022-05-19 NOTE — Telephone Encounter (Signed)
[place] to schedule their annual wellness visit. Appointment made for 05/29/2022.  Thank you,  [place] Direct dial  [phone]

## 2022-05-24 DIAGNOSIS — C44622 Squamous cell carcinoma of skin of right upper limb, including shoulder: Secondary | ICD-10-CM | POA: Diagnosis not present

## 2022-05-29 ENCOUNTER — Ambulatory Visit (INDEPENDENT_AMBULATORY_CARE_PROVIDER_SITE_OTHER): Payer: Medicare Other

## 2022-05-29 VITALS — Ht 68.0 in | Wt 158.0 lb

## 2022-05-29 DIAGNOSIS — Z0000 Encounter for general adult medical examination without abnormal findings: Secondary | ICD-10-CM | POA: Diagnosis not present

## 2022-05-29 NOTE — Patient Instructions (Addendum)
[name] , Thank you for taking time to come for your Medicare Wellness Visit. I appreciate your ongoing commitment to your health goals. Please review the following plan we discussed and let me know if I can assist you in the future.   These are the goals we discussed:  Goals      Follow up with Primary Care Provider     As needed.         This is a list of the screening recommended for you and due dates:  Health Maintenance  Topic Date Due   COVID-19 Vaccine (4 - 2023-24 season) 06/14/2022*   Flu Shot  09/28/2022   Mammogram  04/06/2023   Medicare Annual Wellness Visit  05/29/2023   DTaP/Tdap/Td vaccine (4 - Td or Tdap) 04/26/2024   Pneumonia Vaccine  Completed   DEXA scan (bone density measurement)  Completed   Hepatitis C Screening: USPSTF Recommendation to screen - Ages 91-79 yo.  Completed   Zoster (Shingles) Vaccine  Completed   HPV Vaccine  Aged Out   Colon Cancer Screening  Discontinued  *Topic was postponed. The date shown is not the original due date.    Advanced directives: End of life planning; Advance aging; Advanced directives discussed.  Copy of current HCPOA/Living Will requested.    Conditions/risks identified: none new  Next appointment: Follow up in one year for your annual wellness visit    Preventive Care 65 Years and Older, Female Preventive care refers to lifestyle choices and visits with your health care provider that can promote health and wellness. What does preventive care include? A yearly physical exam. This is also called an annual well check. Dental exams once or twice a year. Routine eye exams. Ask your health care provider how often you should have your eyes checked. Personal lifestyle choices, including: Daily care of your teeth and gums. Regular physical activity. Eating a healthy diet. Avoiding tobacco and drug use. Limiting alcohol use. Practicing safe sex. Taking low-dose aspirin every day. Taking vitamin and mineral  supplements as recommended by your health care provider. What happens during an annual well check? The services and screenings done by your health care provider during your annual well check will depend on your age, overall health, lifestyle risk factors, and family history of disease. Counseling  Your health care provider may ask you questions about your: Alcohol use. Tobacco use. Drug use. Emotional well-being. Home and relationship well-being. Sexual activity. Eating habits. History of falls. Memory and ability to understand (cognition). Work and work Statistician. Reproductive health. Screening  You may have the following tests or measurements: Height, weight, and BMI. Blood pressure. Lipid and cholesterol levels. These may be checked every 5 years, or more frequently if you are over 3 years old. Skin check. Lung cancer screening. You may have this screening every year starting at age 52 if you have a 30-pack-year history of smoking and currently smoke or have quit within the past 15 years. Fecal occult blood test (FOBT) of the stool. You may have this test every year starting at age 59. Flexible sigmoidoscopy or colonoscopy. You may have a sigmoidoscopy every 5 years or a colonoscopy every 10 years starting at age 29. Hepatitis C blood test. Hepatitis B blood test. Sexually transmitted disease (STD) testing. Diabetes screening. This is done by checking your blood sugar (glucose) after you have not eaten for a while (fasting). You may have this done every 1-3 years. Bone density scan. This is done to screen for  osteoporosis. You may have this done starting at age 58. Mammogram. This may be done every 1-2 years. Talk to your health care provider about how often you should have regular mammograms. Talk with your health care provider about your test results, treatment options, and if necessary, the need for more tests. Vaccines  Your health care provider may recommend certain  vaccines, such as: Influenza vaccine. This is recommended every year. Tetanus, diphtheria, and acellular pertussis (Tdap, Td) vaccine. You may need a Td booster every 10 years. Zoster vaccine. You may need this after age 69. Pneumococcal 13-valent conjugate (PCV13) vaccine. One dose is recommended after age 86. Pneumococcal polysaccharide (PPSV23) vaccine. One dose is recommended after age 77. Talk to your health care provider about which screenings and vaccines you need and how often you need them. This information is not intended to replace advice given to you by your health care provider. Make sure you discuss any questions you have with your health care provider. Document Released: 03/12/2015 Document Revised: 11/03/2015 Document Reviewed: 12/15/2014 Elsevier Interactive Patient Education  2017 [place] Prevention in the Home Falls can cause injuries. They can happen to people of all ages. There are many things you can do to make your home safe and to help prevent falls. What can I do on the outside of my home? Regularly fix the edges of walkways and driveways and fix any cracks. Remove anything that might make you trip as you walk through a door, such as a raised step or threshold. Trim any bushes or trees on the path to your home. Use bright outdoor lighting. Clear any walking paths of anything that might make someone trip, such as rocks or tools. Regularly check to see if handrails are loose or broken. Make sure that both sides of any steps have handrails. Any raised decks and porches should have guardrails on the edges. Have any leaves, snow, or ice cleared regularly. Use sand or salt on walking paths during winter. Clean up any spills in your garage right away. This includes oil or grease spills. What can I do in the bathroom? Use night lights. Install grab bars by the toilet and in the tub and shower. Do not use towel bars as grab bars. Use non-skid mats or decals in  the tub or shower. If you need to sit down in the shower, use a plastic, non-slip stool. Keep the floor dry. Clean up any water that spills on the floor as soon as it happens. Remove soap buildup in the tub or shower regularly. Attach bath mats securely with double-sided non-slip rug tape. Do not have throw rugs and other things on the floor that can make you trip. What can I do in the bedroom? Use night lights. Make sure that you have a light by your bed that is easy to reach. Do not use any sheets or blankets that are too big for your bed. They should not hang down onto the floor. Have a firm chair that has side arms. You can use this for support while you get dressed. Do not have throw rugs and other things on the floor that can make you trip. What can I do in the kitchen? Clean up any spills right away. Avoid walking on wet floors. Keep items that you use a lot in easy-to-reach places. If you need to reach something above you, use a strong step stool that has a grab bar. Keep electrical cords out of the way. Do not  use floor polish or wax that makes floors slippery. If you must use wax, use non-skid floor wax. Do not have throw rugs and other things on the floor that can make you trip. What can I do with my stairs? Do not leave any items on the stairs. Make sure that there are handrails on both sides of the stairs and use them. Fix handrails that are broken or loose. Make sure that handrails are as long as the stairways. Check any carpeting to make sure that it is firmly attached to the stairs. Fix any carpet that is loose or worn. Avoid having throw rugs at the top or bottom of the stairs. If you do have throw rugs, attach them to the floor with carpet tape. Make sure that you have a light switch at the top of the stairs and the bottom of the stairs. If you do not have them, ask someone to add them for you. What else can I do to help prevent falls? Wear shoes that: Do not have high  heels. Have rubber bottoms. Are comfortable and fit you well. Are closed at the toe. Do not wear sandals. If you use a stepladder: Make sure that it is fully opened. Do not climb a closed stepladder. Make sure that both sides of the stepladder are locked into place. Ask someone to hold it for you, if possible. Clearly mark and make sure that you can see: Any grab bars or handrails. First and last steps. Where the edge of each step is. Use tools that help you move around (mobility aids) if they are needed. These include: Canes. Walkers. Scooters. Crutches. Turn on the lights when you go into a dark area. Replace any light bulbs as soon as they burn out. Set up your furniture so you have a clear path. Avoid moving your furniture around. If any of your floors are uneven, fix them. If there are any pets around you, be aware of where they are. Review your medicines with your doctor. Some medicines can make you feel dizzy. This can increase your chance of falling. Ask your doctor what other things that you can do to help prevent falls. This information is not intended to replace advice given to you by your health care provider. Make sure you discuss any questions you have with your health care provider. Document Released: 12/10/2008 Document Revised: 07/22/2015 Document Reviewed: 03/20/2014 Elsevier Interactive Patient Education  2017 [employer].

## 2022-05-29 NOTE — Progress Notes (Addendum)
Subjective:   Kendra [name] is a 77 y.o. female who presents for Medicare Annual (Subsequent) preventive examination.  Review of Systems    No ROS.  Medicare Wellness Virtual Visit.  Visual/audio telehealth visit, UTA vital signs.   See social history for additional risk factors.   Cardiac Risk Factors include: advanced age (>23men, >61 women)     Objective:    Today's Vitals   05/29/22 1438  Weight: 158 lb (71.7 kg)  Height: 5\' 8"  (1.727 m)   Body mass index is 24.02 kg/m.     05/29/2022    2:39 PM 05/20/2021    9:24 AM 04/16/2020    9:13 AM 03/26/2020    8:59 AM 10/01/2019   10:06 AM 09/30/2018    9:44 AM 03/08/2016   10:22 AM  Advanced Directives  Does Patient Have a Medical Advance Directive? Yes No No No Yes Yes   Type of Paramedic of [place];Living will    [place];Living will [place];Living will   Does patient want to make changes to medical advance directive? No - Patient declined    No - Patient declined No - Patient declined   Copy of [place] in Chart? No - copy requested    No - copy requested No - copy requested   Would patient like information on creating a medical advance directive?  No - Patient declined No - Patient declined No - Patient declined        Information is confidential and restricted. Go to Review Flowsheets to unlock data.    Current Medications (verified) Outpatient Encounter Medications as of 05/29/2022  Medication Sig   alendronate (FOSAMAX) 70 MG tablet TAKE 1 TABLET EVERY 7 DAYS WITH A FULL GLASS WATER ON AN EMPTY STOMACH   aspirin 81 MG tablet Take 81 mg by mouth daily.   cholecalciferol (VITAMIN D) 1000 units tablet Take 1,000 Units by mouth daily.   furosemide (LASIX) 20 MG tablet TAKE 1 TABLET BY MOUTH EVERY DAY   losartan (COZAAR) 25 MG tablet Take 1 tablet (25 mg total) by mouth daily.   metoprolol succinate (TOPROL-XL) 100 MG 24 hr tablet TAKE 1  TABLET (100 MG TOTAL) BY MOUTH DAILY. WITH OR AFTER A MEAL   Multiple Vitamins-Minerals (MULTIVITAMIN WITH MINERALS) tablet Take 1 tablet by mouth daily.   rosuvastatin (CRESTOR) 5 MG tablet TAKE 1 TABLET BY MOUTH EVERY DAY   warfarin (COUMADIN) 5 MG tablet TAKE 1/2 TO 1 TABLET BY MOUTH DAILY AS DIRECTED BY THE COUMADIN CLINIC   No facility-administered encounter medications on file as of 05/29/2022.    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/2020   Kidney infarction    PFO (patent foramen ovale)    Secondary erythrocytosis    Secundum ASD    Stroke 01/2015   TIA (transient ischemic attack)  as per 07/30/00 note from [facility]    Vitamin D deficiency    Past Surgical History:  Procedure Laterality Date   APPENDECTOMY  1966   BREAST BIOPSY Right 1991   CARDIAC ELECTROPHYSIOLOGY STUDY AND ABLATION  2002   CATARACT EXTRACTION W/ INTRAOCULAR LENS  IMPLANT, BILATERAL Bilateral    CESAREAN SECTION  1981   fibroid tumor removal  1996   HALLUX VALGUS LAPIDUS Right 04/16/2020   Procedure: HALLUX VALGUS LAPIDUS;  Surgeon: Samara Deist, DPM;  Location: [facility] ORS;  Service: Podiatry;  Laterality: Right;   RADIOLOGY WITH ANESTHESIA N/A 02/16/2015   Procedure: RADIOLOGY WITH ANESTHESIA;  Surgeon: Medication Radiologist, MD;  Location: [place] [facility];  Service: Radiology;  Laterality: N/A;   TEE WITHOUT CARDIOVERSION N/A 03/08/2015   Procedure: TRANSESOPHAGEAL ECHOCARDIOGRAM (TEE);  Surgeon:  [name], MD;  Location: [place];  Service: Cardiovascular;  Laterality: N/A;   TEE WITHOUT CARDIOVERSION N/A 04/28/2015   Procedure: TRANSESOPHAGEAL ECHOCARDIOGRAM (TEE);  Surgeon: [name], MD;  Location: [facility];  Service: Cardiovascular;  Laterality: N/A;   Family History  Problem Relation Age of Onset   Stroke Maternal Grandmother    Alcohol abuse Father    Lung cancer Father    Other Brother        Emotional Illness   Lung cancer Brother    Colon cancer Neg Hx    Social History   Socioeconomic History   Marital status: Married    Spouse name: Jeneen Rinks   Number of children: 4   Years of education: Not on file   Highest education level: Not on file  Occupational History   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  Tobacco Use   Smoking status: Never   Smokeless tobacco: Never  Vaping Use   Vaping Use: Never used  Substance and Sexual Activity   Alcohol use: No    Alcohol/week: 0.0 standard drinks of alcohol   Drug use: No   Sexual activity: Not on file  Other Topics Concern   Not on file  Social History Narrative   ** Merged History Encounter **       Lives with husband   No caffeine drinks    Social Determinants of Health   Financial Resource Strain: Low Risk  (05/20/2021)   Overall Financial Resource Strain (CARDIA)    Difficulty of Paying Living Expenses: Not hard at all  Food Insecurity: No Food Insecurity (05/20/2021)   Hunger Vital Sign    Worried About Running Out of Food in the Last Year: Never true    Ran Out of Food in the Last Year: Never true  Transportation Needs: No Transportation Needs (05/20/2021)   PRAPARE - Hydrologist (Medical): No    Lack of Transportation (Non-Medical): No  Physical Activity: Sufficiently Active (05/20/2021)   Exercise Vital Sign    Days of Exercise per Week: 5 days    Minutes of Exercise per Session: 60 min  Stress: No Stress Concern Present (05/20/2021)   [place]    Feeling of Stress : Not at all  Social Connections: Unknown (05/20/2021)   Social Connection and Isolation Panel [NHANES]    Frequency of Communication with Friends and Family: Not on file    Frequency of Social Gatherings with Friends and Family: Not on file    Attends Religious Services: Not on file    Active Member of Clubs or Organizations: Not on file  Attends Archivist Meetings: Not on file    Marital Status: Married    Tobacco Counseling Counseling given: Not Answered   Clinical Intake:  Pre-visit preparation completed: Yes        Diabetes: No  How often do you need to have someone help you when you read instructions, pamphlets, or other written materials from your doctor or pharmacy?: 1 - Never    Interpreter Needed?: No      Activities of Daily Living    05/29/2022    2:42 PM  In your present state of health, do you have any difficulty performing the following activities:  Hearing? 0  Vision? 0  Difficulty concentrating or making decisions? 0  Walking or climbing stairs? 0  Dressing or bathing? 0  Doing errands, shopping? 0  Preparing Food and eating ? N  Using the Toilet? N  In the past six months, have you accidently leaked urine? N  Do you have problems with loss of bowel control? N  Managing your Medications? N  Managing your Finances? N  Housekeeping or managing your Housekeeping? N    Patient Care Team: Crecencio Mc, MD as PCP - General (Internal Medicine) Evans Lance, MD as PCP - Electrophysiology (Cardiology) Garvin Fila, MD as Consulting Physician (Neurology) Annia Belt, MD as Consulting Physician (Hematology) Ethelle Lyon as Consulting Physician (Cardiology)  Indicate any recent Medical Services you may have received from other than [facility] providers in the past year (date may be approximate).     Assessment:   This is a routine wellness  examination for Kendra [name].  I connected with  Kendra [name] on 05/29/22 by a audio enabled telemedicine application and verified that I am speaking with the correct person using two identifiers.  Patient Location: Home  Provider Location: Office/Clinic  I discussed the limitations of evaluation and management by telemedicine. The patient expressed understanding and agreed to proceed.   Hearing/Vision screen Hearing Screening - Comments:: Patient is able to hear conversational tones without difficulty. No issues reported. Vision Screening - Comments:: Wears reader lenses Cataracts, bilateral, extracted They have seen their ophthalmologist in the last 12 months.  Dietary issues and exercise activities discussed: Current Exercise Habits: Home exercise routine, Type of exercise: walking, Time (Minutes): 30, Frequency (Times/Week): 5, Weekly Exercise (Minutes/Week): 150, Intensity: Mild   Goals Addressed             This Visit's Progress    Follow up with Primary Care Provider       As needed.        Depression Screen    05/29/2022    2:42 PM 05/08/2022    9:10 AM 05/20/2021    9:23 AM 05/04/2021    9:42 AM 10/01/2019   10:05 AM 09/30/2018    9:44 AM 03/15/2017   10:16 AM  PHQ 2/9 Scores  PHQ - 2 Score 0 0 0 0 0 0   PHQ- 9 Score            Information is confidential and restricted. Go to Review Flowsheets to unlock data.    Fall Risk    05/29/2022    2:41 PM 05/08/2022    9:10 AM 05/20/2021    9:25 AM 05/04/2021    9:42 AM 05/03/2020   10:44 AM  Fall Risk   Falls in the past year? 0 0 0 0 0  Number falls in past yr: 0 0 0    Injury with Fall?  0 0     Risk for fall due to :  No Fall Risks  No Fall Risks   Follow up Falls evaluation completed;Falls prevention discussed Falls evaluation completed Falls evaluation completed Falls evaluation completed Falls evaluation completed    FALL RISK PREVENTION PERTAINING TO THE HOME: Home free of loose throw rugs in walkways, pet beds,  electrical cords, etc? Yes  Adequate lighting in your home to reduce risk of falls? Yes   ASSISTIVE DEVICES UTILIZED TO PREVENT FALLS: Life alert? No  Use of a cane, walker or w/c? No   TIMED UP AND GO: Was the test performed? No .   Cognitive Function:    10/01/2019   10:08 AM  MMSE - Mini Mental State Exam  Not completed: Unable to complete        05/29/2022    2:43 PM 05/20/2021    9:29 AM 09/30/2018    9:45 AM  6CIT Screen  What Year? 0 points  0 points  What month? 0 points  0 points  What time? 0 points  0 points  Count back from 20 0 points  0 points  Months in reverse 0 points 0 points 0 points  Repeat phrase 0 points  0 points  Total Score 0 points  0 points    Immunizations Immunization History  Administered Date(s) Administered   Influenza Split 11/28/2014   Influenza Whole 11/27/2008   Influenza, High Dose Seasonal PF 11/10/2015   Influenza, Quadrivalent, Recombinant, Inj, Pf 12/31/2019   Influenza, Seasonal, Injecte, Preservative Fre 11/14/2007   Influenza-Unspecified 12/12/2016, 11/11/2017, 12/12/2018, 01/02/2021   Moderna Sars-Covid-2 Vaccination 03/14/2019, 04/11/2019, 12/23/2019   Pneumococcal Conjugate-13 04/21/2014   Pneumococcal Polysaccharide-23 11/27/2008, 06/21/2010, 03/15/2017   Td 02/27/2002   Tdap 12/11/2013, 04/26/2014   Zoster Recombinat (Shingrix) 11/11/2017, 05/01/2018   Zoster, Live 04/26/2005, 06/20/2006   Screening Tests Health Maintenance  Topic Date Due   COVID-19 Vaccine (4 - 2023-24 season) 06/14/2022 (Originally 10/28/2021)   INFLUENZA VACCINE  09/28/2022   MAMMOGRAM  04/06/2023   Medicare Annual Wellness (AWV)  05/29/2023   DTaP/Tdap/Td (4 - Td or Tdap) 04/26/2024   Pneumonia Vaccine 63+ Years old  Completed   DEXA SCAN  Completed   Hepatitis C Screening  Completed   Zoster Vaccines- Shingrix  Completed   HPV VACCINES  Aged Out   COLONOSCOPY (Pts 45-12yrs Insurance coverage will need to be confirmed)  Discontinued   Health  Maintenance There are no preventive care reminders to display for this patient.  Lung Cancer Screening: (Low Dose CT Chest recommended if Age 15-80 years, 30 pack-year currently smoking OR have quit w/in 15years.) does not qualify.   Hepatitis C Screening: Completed 03/2008.  Vision Screening: Recommended annual ophthalmology exams for early detection of glaucoma and other disorders of the eye.  Dental Screening: Recommended annual dental exams for proper oral hygiene  Community Resource Referral / Chronic Care Management: CRR required this visit?  No   CCM required this visit?  No      Plan:     I have personally reviewed and noted the following in the patient's chart:   Medical and social history Use of alcohol, tobacco or illicit drugs  Current medications and supplements including opioid prescriptions. Patient is not currently taking opioid prescriptions. Functional ability and status Nutritional status Physical activity Advanced directives List of other physicians Hospitalizations, surgeries, and ER visits in previous 12 months Vitals Screenings to include cognitive, depression, and falls Referrals and appointments  In  addition, I have reviewed and discussed with patient certain preventive protocols, quality metrics, and best practice recommendations. A written personalized care plan for preventive services as well as general preventive health recommendations were provided to patient.     Hildreth Orsak L Motley, LPN   07/06/4583     I have reviewed the above information and agree with above.   Duncan Dull, MD

## 2022-05-31 ENCOUNTER — Ambulatory Visit (INDEPENDENT_AMBULATORY_CARE_PROVIDER_SITE_OTHER): Payer: Medicare Other

## 2022-05-31 DIAGNOSIS — Z5181 Encounter for therapeutic drug level monitoring: Secondary | ICD-10-CM

## 2022-05-31 DIAGNOSIS — I480 Paroxysmal atrial fibrillation: Secondary | ICD-10-CM | POA: Diagnosis not present

## 2022-05-31 LAB — POCT INR: INR: 2.5 (ref 2.0–3.0)

## 2022-05-31 NOTE — Patient Instructions (Signed)
Description   Spoke with pt and instructed to take 1.5 tablets today, then START taking Warfarin 1/2 tablet daily except for 1 tablet on Sundays, Mondays, Wednesdays and Fridays.  Recheck INR in 2 weeks.  [facility] [phone]

## 2022-06-05 ENCOUNTER — Other Ambulatory Visit: Payer: Self-pay | Admitting: Internal Medicine

## 2022-06-14 ENCOUNTER — Ambulatory Visit (INDEPENDENT_AMBULATORY_CARE_PROVIDER_SITE_OTHER): Payer: Medicare Other | Admitting: Pharmacist

## 2022-06-14 DIAGNOSIS — Z5181 Encounter for therapeutic drug level monitoring: Secondary | ICD-10-CM | POA: Diagnosis not present

## 2022-06-14 DIAGNOSIS — Q2112 Patent foramen ovale: Secondary | ICD-10-CM | POA: Diagnosis not present

## 2022-06-14 DIAGNOSIS — I480 Paroxysmal atrial fibrillation: Secondary | ICD-10-CM | POA: Diagnosis not present

## 2022-06-14 DIAGNOSIS — Z8679 Personal history of other diseases of the circulatory system: Secondary | ICD-10-CM

## 2022-06-14 LAB — POCT INR: INR: 4 — AB (ref 2.0–3.0)

## 2022-06-14 NOTE — Patient Instructions (Addendum)
Description   Spoke with pt and instructed to hold dose today and then continue Warfarin 1/2 tablet daily except for 1 tablet on Sundays, Mondays, Wednesdays and Fridays.  Recheck INR in 2 weeks.  [facility] Clinic [phone]

## 2022-06-28 ENCOUNTER — Ambulatory Visit (INDEPENDENT_AMBULATORY_CARE_PROVIDER_SITE_OTHER): Payer: Medicare Other | Admitting: *Deleted

## 2022-06-28 DIAGNOSIS — I480 Paroxysmal atrial fibrillation: Secondary | ICD-10-CM | POA: Diagnosis not present

## 2022-06-28 DIAGNOSIS — Z8679 Personal history of other diseases of the circulatory system: Secondary | ICD-10-CM | POA: Diagnosis not present

## 2022-06-28 DIAGNOSIS — Q2112 Patent foramen ovale: Secondary | ICD-10-CM

## 2022-06-28 DIAGNOSIS — Z5181 Encounter for therapeutic drug level monitoring: Secondary | ICD-10-CM

## 2022-06-28 LAB — POCT INR: INR: 3 (ref 2.0–3.0)

## 2022-06-28 NOTE — Patient Instructions (Signed)
Description   Spoke with pt and instructed to continue Warfarin 1/2 tablet daily except for 1 tablet on Sundays, Mondays, Wednesdays and Fridays.  Recheck INR in 2 weeks.  [facility] [phone]

## 2022-07-12 ENCOUNTER — Ambulatory Visit (INDEPENDENT_AMBULATORY_CARE_PROVIDER_SITE_OTHER): Payer: Medicare Other

## 2022-07-12 DIAGNOSIS — Q2112 Patent foramen ovale: Secondary | ICD-10-CM | POA: Diagnosis not present

## 2022-07-12 DIAGNOSIS — Z5181 Encounter for therapeutic drug level monitoring: Secondary | ICD-10-CM

## 2022-07-12 DIAGNOSIS — I480 Paroxysmal atrial fibrillation: Secondary | ICD-10-CM | POA: Diagnosis not present

## 2022-07-12 DIAGNOSIS — Z8679 Personal history of other diseases of the circulatory system: Secondary | ICD-10-CM

## 2022-07-12 LAB — POCT INR: INR: 4.1 — AB (ref 2.0–3.0)

## 2022-07-12 NOTE — Patient Instructions (Signed)
Description   Spoke with pt and instructed to only take 1/2 tablet today and then continue Warfarin 1/2 tablet daily except for 1 tablet on Sundays, Mondays, Wednesdays and Fridays.  Recheck INR in 2 weeks.  C[facility]madin Clinic [phone]

## 2022-07-19 DIAGNOSIS — N958 Other specified menopausal and perimenopausal disorders: Secondary | ICD-10-CM | POA: Diagnosis not present

## 2022-07-19 DIAGNOSIS — M8588 Other specified disorders of bone density and structure, other site: Secondary | ICD-10-CM | POA: Diagnosis not present

## 2022-07-19 LAB — HM DEXA SCAN

## 2022-07-20 ENCOUNTER — Encounter: Payer: Self-pay | Admitting: Internal Medicine

## 2022-07-26 ENCOUNTER — Ambulatory Visit (INDEPENDENT_AMBULATORY_CARE_PROVIDER_SITE_OTHER): Payer: Medicare Other

## 2022-07-26 ENCOUNTER — Encounter: Payer: Self-pay | Admitting: Internal Medicine

## 2022-07-26 DIAGNOSIS — Z5181 Encounter for therapeutic drug level monitoring: Secondary | ICD-10-CM

## 2022-07-26 DIAGNOSIS — I480 Paroxysmal atrial fibrillation: Secondary | ICD-10-CM | POA: Diagnosis not present

## 2022-07-26 LAB — POCT INR: INR: 2.9 (ref 2.0–3.0)

## 2022-07-26 NOTE — Patient Instructions (Signed)
Description   Spoke with pt and instructed to continue Warfarin 1/2 tablet daily except for 1 tablet on Sundays, Mondays, Wednesdays and Fridays.  Recheck INR in 2 weeks.  [facility] [phone]      

## 2022-07-27 NOTE — Telephone Encounter (Signed)
Pt is requesting results of her bone density scan.

## 2022-07-31 ENCOUNTER — Encounter: Payer: Self-pay | Admitting: Internal Medicine

## 2022-07-31 ENCOUNTER — Other Ambulatory Visit: Payer: Self-pay | Admitting: Internal Medicine

## 2022-08-09 ENCOUNTER — Ambulatory Visit (INDEPENDENT_AMBULATORY_CARE_PROVIDER_SITE_OTHER): Payer: Medicare Other

## 2022-08-09 DIAGNOSIS — Z5181 Encounter for therapeutic drug level monitoring: Secondary | ICD-10-CM

## 2022-08-09 LAB — POCT INR: INR: 4.1 — AB (ref 2.0–3.0)

## 2022-08-09 NOTE — Patient Instructions (Signed)
Description   Spoke with pt and instructed to only take 1/2 tablet today and then START taking Warfarin 1/2 tablet daily except for 1 tablet on Mondays, Wednesdays and Fridays.  Recheck INR in 2 weeks.  [facility] [phone]

## 2022-08-23 ENCOUNTER — Ambulatory Visit (INDEPENDENT_AMBULATORY_CARE_PROVIDER_SITE_OTHER): Payer: Medicare Other

## 2022-08-23 DIAGNOSIS — Z5181 Encounter for therapeutic drug level monitoring: Secondary | ICD-10-CM | POA: Diagnosis not present

## 2022-08-23 DIAGNOSIS — Q2112 Patent foramen ovale: Secondary | ICD-10-CM | POA: Diagnosis not present

## 2022-08-23 DIAGNOSIS — Z8679 Personal history of other diseases of the circulatory system: Secondary | ICD-10-CM

## 2022-08-23 DIAGNOSIS — I480 Paroxysmal atrial fibrillation: Secondary | ICD-10-CM | POA: Diagnosis not present

## 2022-08-23 LAB — POCT INR: INR: 2.8 (ref 2.0–3.0)

## 2022-08-23 NOTE — Patient Instructions (Signed)
Description   Spoke with pt and instructed to continue taking Warfarin 1/2 tablet daily except for 1 tablet on Mondays, Wednesdays and Fridays.  Recheck INR in 2 weeks.  [facility]in Clinic [phone]

## 2022-08-24 ENCOUNTER — Other Ambulatory Visit: Payer: Self-pay | Admitting: Internal Medicine

## 2022-08-25 ENCOUNTER — Other Ambulatory Visit: Payer: Self-pay | Admitting: Internal Medicine

## 2022-08-29 ENCOUNTER — Encounter: Payer: Self-pay | Admitting: Internal Medicine

## 2022-08-29 ENCOUNTER — Ambulatory Visit: Payer: Medicare Other | Attending: Internal Medicine | Admitting: Internal Medicine

## 2022-08-29 VITALS — BP 138/68 | HR 69 | Ht 68.0 in | Wt 156.0 lb

## 2022-08-29 DIAGNOSIS — I340 Nonrheumatic mitral (valve) insufficiency: Secondary | ICD-10-CM | POA: Insufficient documentation

## 2022-08-29 DIAGNOSIS — Q2112 Patent foramen ovale: Secondary | ICD-10-CM | POA: Insufficient documentation

## 2022-08-29 NOTE — Patient Instructions (Signed)
Medication Instructions:  Your physician recommends that you continue on your current medications as directed. Please refer to the Current Medication list given to you today.  *If you need a refill on your cardiac medications before your next appointment, please call your pharmacy*  Testing/Procedures: Your physician has requested that you have an echocardiogram. Echocardiography is a painless test that uses sound waves to create images of your heart. It provides your doctor with information about the size and shape of your heart and how well your heart's chambers and valves are working. This procedure takes approximately one hour. There are no restrictions for this procedure. Please do NOT wear cologne, perfume, aftershave, or lotions (deodorant is allowed). Please arrive 15 minutes prior to your appointment time.  Follow-Up: At [facility], you and your health needs are our priority.  As part of our continuing mission to provide you with exceptional heart care, we have created designated Provider Care Teams.  These Care Teams include your primary Cardiologist (physician) and Advanced Practice Providers (APPs -  Physician Assistants and Nurse Practitioners) who all work together to provide you with the care you need, when you need it.  Your next appointment:   1 year(s)  Provider:   You may see Lewayne Bunting, MD or one of the following Advanced Practice Providers on your designated Care Team:   Francis Dowse, [place] [address] [place], [place] Sherie Don, NP Canary Brim, NP

## 2022-08-29 NOTE — Progress Notes (Signed)
HPI Mrs. Caulder returns today for followup. She is a pleasant 77 yo woman with a h/o HTN, remote atrial fib and flutter, recurrent thromboembolic events usually when her INR was subtherapeutic or once when she forgot her pradaxa. She has done well in the interim except for some dyspnea. She is using coumadin. She has recovered from bunion surgery. [place]. She denies chest pain or sob or edema. No palpitations.  Allergies  Allergen Reactions   Amiodarone Hcl Nausea Only and Rash     Current Outpatient Medications  Medication Sig Dispense Refill   aspirin 81 MG tablet Take 81 mg by mouth daily.     cholecalciferol (VITAMIN D) 1000 units tablet Take 1,000 Units by mouth daily.     furosemide (LASIX) 20 MG tablet TAKE 1 TABLET BY MOUTH EVERY DAY 90 tablet 0   losartan (COZAAR) 25 MG tablet TAKE 1 TABLET (25 MG TOTAL) BY MOUTH DAILY. 90 tablet 0   metoprolol succinate (TOPROL-XL) 100 MG 24 hr tablet TAKE 1 TABLET (100 MG TOTAL) BY MOUTH DAILY. WITH OR AFTER A MEAL 90 tablet 0   Multiple Vitamins-Minerals (MULTIVITAMIN WITH MINERALS) tablet Take 1 tablet by mouth daily.     rosuvastatin (CRESTOR) 5 MG tablet TAKE 1 TABLET BY MOUTH EVERY DAY 90 tablet 3   warfarin (COUMADIN) 5 MG tablet TAKE 1/2 TO 1 TABLET BY MOUTH DAILY AS DIRECTED BY THE COUMADIN CLINIC 90 tablet 1   No current facility-administered medications for this visit.     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   Past Surgical History:  Procedure Laterality Date   APPENDECTOMY  1966   BREAST BIOPSY Right 1991   CARDIAC ELECTROPHYSIOLOGY STUDY AND ABLATION  2002   CATARACT EXTRACTION W/ INTRAOCULAR LENS  IMPLANT, BILATERAL Bilateral    CESAREAN SECTION  1981   fibroid tumor removal  1996   HALLUX VALGUS LAPIDUS Right 04/16/2020   Procedure: HALLUX VALGUS LAPIDUS;  Surgeon: Gwyneth Revels, DPM;  Location: [facility] ORS;  Service: Podiatry;  Laterality: Right;   RADIOLOGY WITH ANESTHESIA N/A 02/16/2015   Procedure: RADIOLOGY WITH ANESTHESIA;  Surgeon: Medication Radiologist, MD;  Location: [facility];  Service: Radiology;  Laterality: N/A;   TEE WITHOUT CARDIOVERSION N/A 03/08/2015   Procedure: TRANSESOPHAGEAL ECHOCARDIOGRAM (TEE);  Surgeon: Lars Masson, MD;  Location: [facility] ENDOSCOPY;  Service: Cardiovascular;  Laterality: N/A;   TEE WITHOUT CARDIOVERSION N/A 04/28/2015  Procedure: TRANSESOPHAGEAL ECHOCARDIOGRAM (TEE);  Surgeon: Lars Masson, MD;  Location: [facility] ENDOSCOPY;  Service: Cardiovascular;  Laterality: N/A;     Family History  Problem Relation Age of Onset   Stroke Maternal Grandmother    Alcohol abuse Father    Lung cancer Father    Other Brother        Emotional Illness   Lung cancer Brother    Colon cancer Neg Hx      Social History   Socioeconomic History   Marital status: Married    Spouse name: Fayrene Fearing   Number of children: 4   Years of education: Not on file   Highest education level: Not on file  Occupational History   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  Tobacco Use   Smoking status: Never   Smokeless tobacco: Never   Vaping Use   Vaping Use: Never used  Substance and Sexual Activity   Alcohol use: No    Alcohol/week: 0.0 standard drinks of alcohol   Drug use: No   Sexual activity: Not on file  Other Topics Concern   Not on file  Social History Narrative   ** Merged History Encounter **       Lives with husband   No caffeine drinks    Social Determinants of Health   Financial Resource Strain: Low Risk  (05/20/2021)   Overall Financial Resource Strain (CARDIA)    Difficulty of Paying Living Expenses: Not hard at all  Food Insecurity: No Food Insecurity (05/20/2021)   Hunger Vital Sign    Worried About Running Out of Food in the Last Year: Never true    Ran Out of Food in the Last Year: Never true  Transportation Needs: No Transportation Needs (05/20/2021)   PRAPARE - Administrator, Civil Service (Medical): No    Lack of Transportation (Non-Medical): No  Physical Activity: Sufficiently Active (05/20/2021)   Exercise Vital Sign    Days of Exercise per Week: 5 days    Minutes of Exercise per Session: 60 min  Stress: No Stress Concern Present (05/20/2021)   [employer] of Occupational Health - Occupational Stress Questionnaire    Feeling of Stress : Not at all  Social Connections: Unknown (05/20/2021)   Social Connection and Isolation Panel [NHANES]    Frequency of Communication with Friends and Family: Not on file    Frequency of Social Gatherings with Friends and Family: Not on file    Attends Religious Services: Not on file    Active Member of Clubs or Organizations: Not on file    Attends Banker Meetings: Not on file    Marital Status: Married  Intimate Partner Violence: Not At Risk (05/20/2021)   Humiliation, Afraid, Rape, and Kick questionnaire    Fear of Current or Ex-Partner: No    Emotionally Abused: No    Physically Abused: No    Sexually Abused: No     BP 138/68   Pulse 69   Ht 5\' 8"  (1.727 m)   Wt 156 lb (70.8 kg)   SpO2 97%   BMI 23.72  kg/m   Physical Exam:  Well appearing 77 yo woman, NAD HEENT: Unremarkable Neck:  6 cm JVD, no thyromegally Lymphatics:  No adenopathy Back:  No CVA tenderness Lungs:  Clear with no wheezes HEART:  Regular rate rhythm, 3/6 systolic murmurs, no rubs, no clicks Abd:  soft, positive bowel sounds, no organomegally, no rebound, no guarding Ext:  2 plus pulses, no edema,  no cyanosis, no clubbing Skin:  No rashes no nodules Neuro:  CN II through XII intact, motor grossly intact  Assess/Plan:  Coags -she will continue systemic anti-coagulation with Warfarin.  2. Dyslipidemia - she will continue her crestor at low dose 3. HTN - her bp is well controlled. No change. 4. Atrial fib - she has not had any in the last year, at least by her symptoms.  5. 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 I asked her to repeat her echo.    Loman Chroman Henry Demeritt,MD

## 2022-08-30 ENCOUNTER — Other Ambulatory Visit: Payer: Self-pay | Admitting: Internal Medicine

## 2022-09-06 ENCOUNTER — Ambulatory Visit (INDEPENDENT_AMBULATORY_CARE_PROVIDER_SITE_OTHER): Payer: Medicare Other

## 2022-09-06 DIAGNOSIS — Z5181 Encounter for therapeutic drug level monitoring: Secondary | ICD-10-CM

## 2022-09-06 LAB — POCT INR: INR: 3.1 — AB (ref 2.0–3.0)

## 2022-09-06 NOTE — Patient Instructions (Signed)
Description   Spoke with pt and instructed to continue taking Warfarin 1/2 tablet daily except for 1 tablet on Mondays, Wednesdays and Fridays.  Recheck INR in 2 weeks.  Coumadin Clinic [phone]      

## 2022-09-12 ENCOUNTER — Ambulatory Visit (HOSPITAL_COMMUNITY)
Admission: RE | Admit: 2022-09-12 | Discharge: 2022-09-12 | Disposition: A | Discharge status: Home / Self Care | Payer: Medicare Other | Source: Ambulatory Visit | Attending: Internal Medicine | Admitting: Internal Medicine

## 2022-09-12 DIAGNOSIS — I10 Essential (primary) hypertension: Secondary | ICD-10-CM | POA: Insufficient documentation

## 2022-09-12 DIAGNOSIS — I4891 Unspecified atrial fibrillation: Secondary | ICD-10-CM | POA: Diagnosis not present

## 2022-09-12 DIAGNOSIS — I4892 Unspecified atrial flutter: Secondary | ICD-10-CM | POA: Diagnosis not present

## 2022-09-12 DIAGNOSIS — I080 Rheumatic disorders of both mitral and aortic valves: Secondary | ICD-10-CM | POA: Insufficient documentation

## 2022-09-12 DIAGNOSIS — Q2112 Patent foramen ovale: Secondary | ICD-10-CM | POA: Diagnosis not present

## 2022-09-12 DIAGNOSIS — I340 Nonrheumatic mitral (valve) insufficiency: Secondary | ICD-10-CM | POA: Diagnosis not present

## 2022-09-12 DIAGNOSIS — Z8673 Personal history of transient ischemic attack (TIA), and cerebral infarction without residual deficits: Secondary | ICD-10-CM | POA: Insufficient documentation

## 2022-09-12 LAB — ECHOCARDIOGRAM COMPLETE
AR max vel: 1.8 cm2
AV Area VTI: 1.64 cm2
AV Area mean vel: 1.69 cm2
AV Mean grad: 12.7 mmHg
AV Peak grad: 21.3 mmHg
Ao pk vel: 2.31 m/s
Area-P 1/2: 2.96 cm2
Calc EF: 59.5 %
MV VTI: 2.26 cm2
S' Lateral: 3 cm
Single Plane A2C EF: 57.8 %
Single Plane A4C EF: 62 %

## 2022-09-12 NOTE — Progress Notes (Signed)
Echocardiogram 2D Echocardiogram has been performed.  Kendra [name] 09/12/2022, 4:08 PM

## 2022-09-15 ENCOUNTER — Telehealth: Payer: Self-pay | Admitting: Internal Medicine

## 2022-09-15 NOTE — Telephone Encounter (Signed)
Returned Kendra [name]'s call. Gave results from Echo to Kendra [name]. Kendra [name] stated understanding. Kendra [name] would like to be sent a DPR form to fill out for her daughter. Kendra [name] also requested that I send Dr. Lubertha Basque note to them via MyChart.

## 2022-09-15 NOTE — Telephone Encounter (Signed)
Patient is calling to receive echo results 

## 2022-09-20 ENCOUNTER — Ambulatory Visit (INDEPENDENT_AMBULATORY_CARE_PROVIDER_SITE_OTHER): Payer: Medicare Other

## 2022-09-20 DIAGNOSIS — Z5181 Encounter for therapeutic drug level monitoring: Secondary | ICD-10-CM

## 2022-09-20 DIAGNOSIS — I480 Paroxysmal atrial fibrillation: Secondary | ICD-10-CM | POA: Diagnosis not present

## 2022-09-20 LAB — POCT INR: INR: 3.3 — AB (ref 2.0–3.0)

## 2022-09-20 NOTE — Patient Instructions (Signed)
Description   Spoke with pt and instructed to continue taking Warfarin 1/2 tablet daily except for 1 tablet on Mondays, Wednesdays and Fridays.  Recheck INR in 2 weeks.  Coumadin Clinic [phone]      

## 2022-10-02 ENCOUNTER — Other Ambulatory Visit: Payer: Self-pay | Admitting: Internal Medicine

## 2022-10-04 ENCOUNTER — Ambulatory Visit: Payer: Self-pay

## 2022-10-04 DIAGNOSIS — Z5181 Encounter for therapeutic drug level monitoring: Secondary | ICD-10-CM | POA: Diagnosis not present

## 2022-10-04 LAB — POCT INR: INR: 2.8 (ref 2.0–3.0)

## 2022-10-04 NOTE — Patient Instructions (Signed)
Description   Spoke with pt and instructed to continue taking Warfarin 1/2 tablet daily except for 1 tablet on Mondays, Wednesdays and Fridays.  Recheck INR in 2 weeks.  Coumadin Clinic [phone]      

## 2022-10-13 ENCOUNTER — Other Ambulatory Visit: Payer: Self-pay | Admitting: Internal Medicine

## 2022-10-18 ENCOUNTER — Ambulatory Visit (INDEPENDENT_AMBULATORY_CARE_PROVIDER_SITE_OTHER): Payer: Medicare Other

## 2022-10-18 DIAGNOSIS — Z5181 Encounter for therapeutic drug level monitoring: Secondary | ICD-10-CM | POA: Diagnosis not present

## 2022-10-18 LAB — POCT INR: INR: 3.5 — AB (ref 2.0–3.0)

## 2022-10-18 NOTE — Patient Instructions (Signed)
Description   Spoke with pt and instructed to continue taking Warfarin 1/2 tablet daily except for 1 tablet on Mondays, Wednesdays and Fridays.  Recheck INR in 2 weeks.  Coumadin Clinic [phone]      

## 2022-11-01 ENCOUNTER — Ambulatory Visit (INDEPENDENT_AMBULATORY_CARE_PROVIDER_SITE_OTHER): Payer: Medicare Other

## 2022-11-01 DIAGNOSIS — Z5181 Encounter for therapeutic drug level monitoring: Secondary | ICD-10-CM

## 2022-11-01 LAB — POCT INR: INR: 3.3 — AB (ref 2.0–3.0)

## 2022-11-01 NOTE — Patient Instructions (Signed)
Description   Spoke with pt and instructed to continue taking Warfarin 1/2 tablet daily except for 1 tablet on Mondays, Wednesdays and Fridays.  Recheck INR in 2 weeks.  Coumadin Clinic [phone]      

## 2022-11-15 ENCOUNTER — Ambulatory Visit (INDEPENDENT_AMBULATORY_CARE_PROVIDER_SITE_OTHER): Payer: Medicare Other | Admitting: Cardiology

## 2022-11-15 DIAGNOSIS — I480 Paroxysmal atrial fibrillation: Secondary | ICD-10-CM | POA: Diagnosis not present

## 2022-11-15 DIAGNOSIS — Z5181 Encounter for therapeutic drug level monitoring: Secondary | ICD-10-CM

## 2022-11-15 LAB — POCT INR: INR: 2.9 (ref 2.0–3.0)

## 2022-11-15 NOTE — Patient Instructions (Signed)
Description   Spoke with pt and instructed her to continue taking Warfarin 1/2 tablet daily except for 1 tablet on Mondays, Wednesdays and Fridays.  Recheck INR in 2 weeks.  Coumadin Clinic [phone]

## 2022-11-29 ENCOUNTER — Ambulatory Visit (INDEPENDENT_AMBULATORY_CARE_PROVIDER_SITE_OTHER): Payer: Self-pay

## 2022-11-29 DIAGNOSIS — Z5181 Encounter for therapeutic drug level monitoring: Secondary | ICD-10-CM

## 2022-11-29 LAB — POCT INR: INR: 3.2 — AB (ref 2.0–3.0)

## 2022-11-29 NOTE — Patient Instructions (Signed)
Description   Spoke with pt and instructed her to continue taking Warfarin 1/2 tablet daily except for 1 tablet on Mondays, Wednesdays and Fridays.  Recheck INR in 2 weeks.  Coumadin Clinic [phone]

## 2022-12-10 ENCOUNTER — Other Ambulatory Visit: Payer: Self-pay | Admitting: Internal Medicine

## 2022-12-13 ENCOUNTER — Telehealth: Payer: Self-pay | Admitting: Internal Medicine

## 2022-12-13 ENCOUNTER — Ambulatory Visit (INDEPENDENT_AMBULATORY_CARE_PROVIDER_SITE_OTHER): Payer: Medicare Other

## 2022-12-13 DIAGNOSIS — I480 Paroxysmal atrial fibrillation: Secondary | ICD-10-CM | POA: Diagnosis not present

## 2022-12-13 DIAGNOSIS — Z8679 Personal history of other diseases of the circulatory system: Secondary | ICD-10-CM

## 2022-12-13 DIAGNOSIS — Z5181 Encounter for therapeutic drug level monitoring: Secondary | ICD-10-CM | POA: Diagnosis not present

## 2022-12-13 DIAGNOSIS — Q2112 Patent foramen ovale: Secondary | ICD-10-CM

## 2022-12-13 LAB — POCT INR: INR: 3.3 — AB (ref 2.0–3.0)

## 2022-12-13 NOTE — Patient Instructions (Signed)
Description   Spoke with pt and instructed her to continue taking Warfarin 1/2 tablet daily except for 1 tablet on Mondays, Wednesdays and Fridays.  Recheck INR in 2 weeks.  Coumadin Clinic [phone]

## 2022-12-13 NOTE — Telephone Encounter (Signed)
Please refer to Anticoagulation Encounter from today regarding INR results.

## 2022-12-13 NOTE — Telephone Encounter (Signed)
Patient is requesting call back from coumadin clinic in regards to her labs that were done today.

## 2022-12-27 ENCOUNTER — Ambulatory Visit (INDEPENDENT_AMBULATORY_CARE_PROVIDER_SITE_OTHER): Payer: Medicare Other | Admitting: *Deleted

## 2022-12-27 DIAGNOSIS — Z5181 Encounter for therapeutic drug level monitoring: Secondary | ICD-10-CM

## 2022-12-27 DIAGNOSIS — Z8679 Personal history of other diseases of the circulatory system: Secondary | ICD-10-CM | POA: Diagnosis not present

## 2022-12-27 DIAGNOSIS — I480 Paroxysmal atrial fibrillation: Secondary | ICD-10-CM | POA: Diagnosis not present

## 2022-12-27 DIAGNOSIS — Q2112 Patent foramen ovale: Secondary | ICD-10-CM | POA: Diagnosis not present

## 2022-12-27 LAB — POCT INR: INR: 2.7 (ref 2.0–3.0)

## 2022-12-27 NOTE — Patient Instructions (Signed)
Description   Spoke with pt and instructed her to continue taking Warfarin 1/2 tablet daily except for 1 tablet on Mondays, Wednesdays and Fridays.  Recheck INR in 2 weeks.  Coumadin Clinic [phone]

## 2022-12-29 DIAGNOSIS — Z23 Encounter for immunization: Secondary | ICD-10-CM | POA: Diagnosis not present

## 2023-01-10 ENCOUNTER — Ambulatory Visit (INDEPENDENT_AMBULATORY_CARE_PROVIDER_SITE_OTHER): Payer: Medicare Other

## 2023-01-10 DIAGNOSIS — Z5181 Encounter for therapeutic drug level monitoring: Secondary | ICD-10-CM

## 2023-01-10 DIAGNOSIS — I480 Paroxysmal atrial fibrillation: Secondary | ICD-10-CM | POA: Diagnosis not present

## 2023-01-10 LAB — POCT INR: INR: 3.4 — AB (ref 2.0–3.0)

## 2023-01-10 NOTE — Patient Instructions (Signed)
Description   Spoke with pt and instructed her to continue taking Warfarin 1/2 tablet daily except for 1 tablet on Mondays, Wednesdays and Fridays.  Recheck INR in 2 weeks.  Coumadin Clinic [phone]

## 2023-01-23 LAB — POCT INR: INR: 3.6 — AB (ref 2.0–3.0)

## 2023-01-24 ENCOUNTER — Ambulatory Visit (INDEPENDENT_AMBULATORY_CARE_PROVIDER_SITE_OTHER): Payer: Medicare Other | Admitting: Cardiology

## 2023-01-24 DIAGNOSIS — I480 Paroxysmal atrial fibrillation: Secondary | ICD-10-CM

## 2023-01-24 DIAGNOSIS — Q2112 Patent foramen ovale: Secondary | ICD-10-CM | POA: Diagnosis not present

## 2023-01-24 DIAGNOSIS — Z5181 Encounter for therapeutic drug level monitoring: Secondary | ICD-10-CM

## 2023-01-24 DIAGNOSIS — Z8679 Personal history of other diseases of the circulatory system: Secondary | ICD-10-CM

## 2023-01-24 NOTE — Patient Instructions (Signed)
Description   Spoke with pt and instructed her to take 1/2 tablet of warfarin today then continue taking Warfarin 1/2 tablet daily except for 1 tablet on Mondays, Wednesdays and Fridays.  Recheck INR in 2 weeks.  Coumadin Clinic [phone]

## 2023-02-07 ENCOUNTER — Ambulatory Visit (INDEPENDENT_AMBULATORY_CARE_PROVIDER_SITE_OTHER): Payer: Medicare Other | Admitting: *Deleted

## 2023-02-07 DIAGNOSIS — Z8679 Personal history of other diseases of the circulatory system: Secondary | ICD-10-CM

## 2023-02-07 DIAGNOSIS — Z5181 Encounter for therapeutic drug level monitoring: Secondary | ICD-10-CM

## 2023-02-07 DIAGNOSIS — I480 Paroxysmal atrial fibrillation: Secondary | ICD-10-CM

## 2023-02-07 DIAGNOSIS — Q2112 Patent foramen ovale: Secondary | ICD-10-CM

## 2023-02-07 LAB — POCT INR: INR: 2.3 (ref 2.0–3.0)

## 2023-02-07 NOTE — Patient Instructions (Signed)
Description   Spoke with pt and instructed her to 1.5 tablets of warfarin today then continue taking Warfarin 1/2 tablet daily except for 1 tablet on Mondays, Wednesdays and Fridays.  Recheck INR in 2 weeks.  Coumadin Clinic [phone]

## 2023-02-18 ENCOUNTER — Other Ambulatory Visit: Payer: Self-pay | Admitting: Internal Medicine

## 2023-02-22 ENCOUNTER — Ambulatory Visit (INDEPENDENT_AMBULATORY_CARE_PROVIDER_SITE_OTHER): Payer: Medicare Other | Admitting: Pharmacist

## 2023-02-22 DIAGNOSIS — Q2112 Patent foramen ovale: Secondary | ICD-10-CM

## 2023-02-22 DIAGNOSIS — Z5181 Encounter for therapeutic drug level monitoring: Secondary | ICD-10-CM | POA: Diagnosis not present

## 2023-02-22 DIAGNOSIS — I480 Paroxysmal atrial fibrillation: Secondary | ICD-10-CM

## 2023-02-22 DIAGNOSIS — Z8679 Personal history of other diseases of the circulatory system: Secondary | ICD-10-CM

## 2023-02-22 LAB — POCT INR: INR: 3.5 — AB (ref 2.0–3.0)

## 2023-02-22 NOTE — Patient Instructions (Signed)
 Description   Spoke with pt and instructed her to 1.5 tablets of warfarin today then continue taking Warfarin 1/2 tablet daily except for 1 tablet on Mondays, Wednesdays and Fridays.  Recheck INR in 2 weeks.  Coumadin Clinic [phone]

## 2023-03-07 ENCOUNTER — Ambulatory Visit (INDEPENDENT_AMBULATORY_CARE_PROVIDER_SITE_OTHER): Payer: Medicare Other

## 2023-03-07 DIAGNOSIS — Z5181 Encounter for therapeutic drug level monitoring: Secondary | ICD-10-CM | POA: Diagnosis not present

## 2023-03-07 DIAGNOSIS — I480 Paroxysmal atrial fibrillation: Secondary | ICD-10-CM | POA: Diagnosis not present

## 2023-03-07 LAB — POCT INR: INR: 2.4 (ref 2.0–3.0)

## 2023-03-07 NOTE — Patient Instructions (Signed)
 Description   Spoke with pt and instructed her to 1.5 tablets of warfarin today then continue taking Warfarin 1/2 tablet daily except for 1 tablet on Mondays, Wednesdays and Fridays.  Recheck INR in 2 weeks.  Coumadin Clinic [phone]

## 2023-03-21 ENCOUNTER — Ambulatory Visit (INDEPENDENT_AMBULATORY_CARE_PROVIDER_SITE_OTHER): Payer: Medicare Other

## 2023-03-21 DIAGNOSIS — Z5181 Encounter for therapeutic drug level monitoring: Secondary | ICD-10-CM

## 2023-03-21 LAB — POCT INR: INR: 2.5 (ref 2.0–3.0)

## 2023-03-21 NOTE — Patient Instructions (Signed)
Description   Spoke with pt and instructed her to continue taking Warfarin 1/2 tablet daily except for 1 tablet on Mondays, Wednesdays and Fridays.  Recheck INR in 2 weeks.  Coumadin Clinic [phone]

## 2023-04-01 ENCOUNTER — Other Ambulatory Visit: Payer: Self-pay | Admitting: Internal Medicine

## 2023-04-03 DIAGNOSIS — Z961 Presence of intraocular lens: Secondary | ICD-10-CM | POA: Diagnosis not present

## 2023-04-04 ENCOUNTER — Ambulatory Visit (INDEPENDENT_AMBULATORY_CARE_PROVIDER_SITE_OTHER): Payer: Self-pay | Admitting: Cardiology

## 2023-04-04 DIAGNOSIS — Z5181 Encounter for therapeutic drug level monitoring: Secondary | ICD-10-CM

## 2023-04-04 LAB — POCT INR: INR: 2.8 (ref 2.0–3.0)

## 2023-04-04 NOTE — Patient Instructions (Signed)
Description   Spoke with pt and instructed her to continue taking Warfarin 1/2 tablet daily except for 1 tablet on Mondays, Wednesdays and Fridays.  Recheck INR in 2 weeks.  Coumadin Clinic [phone]

## 2023-04-10 ENCOUNTER — Encounter: Payer: Self-pay | Admitting: Nurse Practitioner

## 2023-04-10 ENCOUNTER — Ambulatory Visit: Payer: Medicare Other | Admitting: Nurse Practitioner

## 2023-04-10 VITALS — BP 120/78 | HR 81 | Temp 97.7°F | Ht 68.0 in | Wt 156.8 lb

## 2023-04-10 DIAGNOSIS — J101 Influenza due to other identified influenza virus with other respiratory manifestations: Secondary | ICD-10-CM | POA: Diagnosis not present

## 2023-04-10 DIAGNOSIS — R52 Pain, unspecified: Secondary | ICD-10-CM | POA: Diagnosis not present

## 2023-04-10 LAB — POC URINALSYSI DIPSTICK (AUTOMATED)
Bilirubin, UA: NEGATIVE
Glucose, UA: NEGATIVE
Nitrite, UA: NEGATIVE
Protein, UA: POSITIVE — AB
Spec Grav, UA: 1.025 (ref 1.010–1.025)
Urobilinogen, UA: 0.2 U/dL
pH, UA: 5.5 (ref 5.0–8.0)

## 2023-04-10 LAB — POCT INFLUENZA A/B
Influenza A, POC: POSITIVE — AB
Influenza B, POC: NEGATIVE

## 2023-04-10 MED ORDER — OSELTAMIVIR PHOSPHATE 75 MG PO CAPS: 75.0000 mg | ORAL_CAPSULE | Freq: Two times a day (BID) | ORAL | 0 refills | Status: AC

## 2023-04-10 NOTE — Assessment & Plan Note (Signed)
Possible Urinary Tract Infection. History of UTIs with atypical presentation. Current symptoms of lethargy and feeling unwell. Urinalysis shows signs of infection. -Wait for urine culture results before starting antibiotics. -Plan to follow up in two days for culture results.

## 2023-04-10 NOTE — Assessment & Plan Note (Signed)
Positive rapid flu test. Symptoms include cough, lethargy, nausea, and diarrhea. -Start Tamiflu. -Encourage increased fluid intake and rest.

## 2023-04-10 NOTE — Progress Notes (Signed)
ta  Established Patient Office Visit  Subjective:  Patient ID: Kendra [name], female    DOB: 1945/03/25  Age: 78 y.o. MRN: [id number]  CC:  Chief Complaint  Patient presents with   Urinary Tract Infection   Discussed the use of a AI scribe software for clinical note transcription with the patient, who gave verbal consent to proceed.   HPI  Kendra [name] presents with cough, diarrhea, and generalized malaise.  She has been experiencing a severe dry cough that began two days after attending a crowded event. The cough is persistent, with no shortness of breath, wheezing, or phlegm production.  Alongside the cough, she has nausea and diarrhea, which started concurrently. She feels very tired, lethargic, and unable to rest properly, although she has not had any fever. Her fatigue is significant, similar to previous episodes when she had a urinary tract infection.  She has a history of urinary tract infections, which have been difficult to recognize due to atypical symptoms such as lethargy and malaise without burning or itching. A previous episode required a hospital visit for diagnosis after a prolonged evaluation.  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   Kidney infarction [facility])    PFO (patent foramen ovale)    Secondary erythrocytosis    Secundum ASD    Stroke (HCC) 01/2015   TIA (transient ischemic attack)    as per 07/30/00 note from [facility]    Vitamin D deficiency      Past Surgical History:  Procedure Laterality Date   APPENDECTOMY  1966   BREAST BIOPSY Right 1991   CARDIAC ELECTROPHYSIOLOGY STUDY AND ABLATION  2002   CATARACT EXTRACTION W/ INTRAOCULAR LENS  IMPLANT, BILATERAL Bilateral    CESAREAN SECTION  1981   fibroid tumor removal  1996   HALLUX VALGUS LAPIDUS Right 04/16/2020   Procedure: HALLUX VALGUS LAPIDUS;  Surgeon: Gwyneth Revels, DPM;  Location: [facility] ORS;  Service: Podiatry;  Laterality: Right;   RADIOLOGY WITH ANESTHESIA N/A 02/16/2015   Procedure: RADIOLOGY WITH ANESTHESIA;  Surgeon: Medication Radiologist, MD;  Location: [facility] ORS;  Service: Radiology;  Laterality: N/A;   TEE WITHOUT CARDIOVERSION N/A 03/08/2015   Procedure: TRANSESOPHAGEAL ECHOCARDIOGRAM (TEE);  Surgeon: Lars Masson, MD;  Location: [facility] ENDOSCOPY;  Service: Cardiovascular;  Laterality: N/A;   TEE WITHOUT CARDIOVERSION N/A 04/28/2015   Procedure: TRANSESOPHAGEAL ECHOCARDIOGRAM (TEE);  Surgeon: Lars Masson, MD;  Location: [facility] ENDOSCOPY;  Service: Cardiovascular;  Laterality: N/A;    Family History  Problem Relation Age of Onset   Stroke Maternal Grandmother    Alcohol abuse Father    Lung cancer Father    Other Brother        Emotional Illness   Lung cancer Brother    Colon cancer Neg Hx     Social History   Socioeconomic History   Marital status: Married    Spouse name:  Fayrene Fearing   Number of children: 4   Years of education: Not on file   Highest education level: Not on file  Occupational History   Occupation: Retired from [employer]  Tobacco Use   Smoking status: Never   Smokeless tobacco: Never  Vaping Use   Vaping status: Never Used  Substance and Sexual Activity   Alcohol use: No    Alcohol/week: 0.0 standard drinks of alcohol   Drug use: No   Sexual activity: Not on file  Other Topics Concern   Not on file  Social History Narrative   ** Merged History Encounter **       Lives with husband   No caffeine drinks    Social Drivers  of Corporate investment banker Strain: Low Risk  (05/20/2021)   Overall Financial Resource Strain (CARDIA)    Difficulty of Paying Living Expenses: Not hard at all  Food Insecurity: No Food Insecurity (05/20/2021)   Hunger Vital Sign    Worried About Running Out of Food in the Last Year: Never true    Ran Out of Food in the Last Year: Never true  Transportation Needs: No Transportation Needs (05/20/2021)   PRAPARE - Administrator, Civil Service (Medical): No    Lack of Transportation (Non-Medical): No  Physical Activity: Sufficiently Active (05/20/2021)   Exercise Vital Sign    Days of Exercise per Week: 5 days    Minutes of Exercise per Session: 60 min  Stress: No Stress Concern Present (05/20/2021)   [employer] of Occupational Health - Occupational Stress Questionnaire    Feeling of Stress : Not at all  Social Connections: Unknown (05/20/2021)   Social Connection and Isolation Panel [NHANES]    Frequency of Communication with Friends and Family: Not on file    Frequency of Social Gatherings with Friends and Family: Not on file    Attends Religious Services: Not on file    Active Member of Clubs or Organizations: Not on file    Attends Banker Meetings: Not on file    Marital Status: Married  Intimate Partner Violence: Not At Risk (05/20/2021)   Humiliation, Afraid, Rape, and Kick questionnaire    Fear of Current or Ex-Partner: No    Emotionally Abused: No    Physically Abused: No    Sexually Abused: No     Outpatient Medications Prior to Visit  Medication Sig Dispense Refill   alendronate (FOSAMAX) 70 MG tablet TAKE 1 TABLET EVERY 7 DAYS WITH A FULL GLASS WATER ON AN EMPTY STOMACH 12 tablet 2   aspirin 81 MG tablet Take 81 mg by mouth daily.     cholecalciferol (VITAMIN D) 1000 units tablet Take 1,000 Units by mouth daily.     furosemide (LASIX) 20 MG tablet TAKE 1 TABLET BY MOUTH EVERY DAY 90 tablet 3   losartan (COZAAR) 25 MG tablet TAKE 1  TABLET (25 MG TOTAL) BY MOUTH DAILY. 90 tablet 2   metoprolol succinate (TOPROL-XL) 100 MG 24 hr tablet TAKE 1 TABLET (100 MG TOTAL) BY MOUTH DAILY. WITH OR AFTER A MEAL 90 tablet 3   Multiple Vitamins-Minerals (MULTIVITAMIN WITH MINERALS) tablet Take 1 tablet by mouth daily.     rosuvastatin (CRESTOR) 5 MG tablet TAKE 1 TABLET BY MOUTH EVERY DAY 90 tablet 3   warfarin (COUMADIN) 5 MG tablet TAKE 1/2 TO 1 TABLET BY MOUTH DAILY AS DIRECTED BY THE COUMADIN CLINIC 90 tablet 1   No facility-administered medications prior  to visit.    Allergies  Allergen Reactions   Amiodarone Hcl Nausea Only and Rash    ROS Review of Systems Negative unless indicated in HPI.    Objective:    Physical Exam Constitutional:      Appearance: Normal appearance.  HENT:     Right Ear: Tympanic membrane normal. Tympanic membrane is not erythematous.     Left Ear: Tympanic membrane normal. Tympanic membrane is not erythematous.     Nose:     Right Turbinates: Not enlarged.     Left Turbinates: Not enlarged.     Right Sinus: No maxillary sinus tenderness or frontal sinus tenderness.     Left Sinus: No maxillary sinus tenderness or frontal sinus tenderness.     Mouth/Throat:     Mouth: Mucous membranes are moist.     Pharynx: No pharyngeal swelling, oropharyngeal exudate or posterior oropharyngeal erythema.     Tonsils: No tonsillar exudate.  Cardiovascular:     Rate and Rhythm: Normal rate and regular rhythm.  Pulmonary:     Effort: Pulmonary effort is normal.     Breath sounds: Normal breath sounds. No stridor. No wheezing.  Neurological:     General: No focal deficit present.     Mental Status: She is alert and oriented to person, place, and time. Mental status is at baseline.  Psychiatric:        Mood and Affect: Mood normal.        Behavior: Behavior normal.        Thought Content: Thought content normal.        Judgment: Judgment normal.     BP 120/78   Pulse 81   Temp 97.7 F (36.5 C)    Ht 5\' 8"  (1.727 m)   Wt 156 lb 12.8 oz (71.1 kg)   SpO2 98%   BMI 23.84 kg/m  Wt Readings from Last 3 Encounters:  04/10/23 156 lb 12.8 oz (71.1 kg)  08/29/22 156 lb (70.8 kg)  05/29/22 158 lb (71.7 kg)     Health Maintenance  Topic Date Due   COVID-19 Vaccine (4 - 2024-25 season) 10/29/2022   MAMMOGRAM  04/06/2023   Medicare Annual Wellness (AWV)  05/29/2023   DTaP/Tdap/Td (4 - Td or Tdap) 04/26/2024   Pneumonia Vaccine 66+ Years old  Completed   INFLUENZA VACCINE  Completed   DEXA SCAN  Completed   Hepatitis C Screening  Completed   Zoster Vaccines- Shingrix  Completed   HPV VACCINES  Aged Out   Colonoscopy  Discontinued    There are no preventive care reminders to display for this patient.  Lab Results  Component Value Date   TSH 1.27 05/04/2021   Lab Results  Component Value Date   WBC 7.4 05/08/2022   HGB 15.9 (H) 05/08/2022   HCT 45.6 05/08/2022   MCV 91.1 05/08/2022   PLT 221.0 05/08/2022   Lab Results  Component Value Date   NA 139 05/08/2022   K 3.9 05/08/2022   CO2 28 05/08/2022   GLUCOSE 88 05/08/2022   BUN 16 05/08/2022   CREATININE 0.86 05/08/2022   BILITOT 0.8 05/08/2022   ALKPHOS 72 05/08/2022   AST 18 05/08/2022   ALT 15 05/08/2022   PROT 7.1 05/08/2022   ALBUMIN 4.2 05/08/2022   CALCIUM 9.7 05/08/2022   ANIONGAP 9 03/26/2020   EGFR 87 11/11/2020   GFR 64.88 05/08/2022   Lab Results  Component Value Date   CHOL 165 05/08/2022   Lab  Results  Component Value Date   HDL 61.20 05/08/2022   Lab Results  Component Value Date   LDLCALC 76 05/08/2022   Lab Results  Component Value Date   TRIG 141.0 05/08/2022   Lab Results  Component Value Date   CHOLHDL 3 05/08/2022   Lab Results  Component Value Date   HGBA1C 5.5 05/08/2022      Assessment & Plan:  Influenza A Assessment & Plan: Positive rapid flu test. Symptoms include cough, lethargy, nausea, and diarrhea. -Start Tamiflu. -Encourage increased fluid intake and  rest.   Body aches Assessment & Plan: Possible Urinary Tract Infection. History of UTIs with atypical presentation. Current symptoms of lethargy and feeling unwell. Urinalysis shows signs of infection. -Wait for urine culture results before starting antibiotics. -Plan to follow up in two days for culture results.  Orders: -     POCT Influenza A/B -     POCT Urinalysis Dipstick (Automated) -     Urine Culture  Other orders -     Oseltamivir Phosphate; Take 1 capsule (75 mg total) by mouth 2 (two) times daily for 5 days.  Dispense: 10 capsule; Refill: 0    Follow-up: No follow-ups on file.   Kara Dies, NP

## 2023-04-11 ENCOUNTER — Encounter: Payer: Self-pay | Admitting: Internal Medicine

## 2023-04-11 ENCOUNTER — Other Ambulatory Visit: Payer: Self-pay | Admitting: Nurse Practitioner

## 2023-04-11 DIAGNOSIS — Z1231 Encounter for screening mammogram for malignant neoplasm of breast: Secondary | ICD-10-CM | POA: Diagnosis not present

## 2023-04-11 LAB — HM MAMMOGRAPHY

## 2023-04-11 MED ORDER — CEPHALEXIN 500 MG PO CAPS
500.0000 mg | ORAL_CAPSULE | Freq: Two times a day (BID) | ORAL | 0 refills | Status: DC
Start: 2023-04-11 — End: 2023-05-09

## 2023-04-12 ENCOUNTER — Telehealth: Payer: Self-pay | Admitting: Nurse Practitioner

## 2023-04-12 ENCOUNTER — Encounter: Payer: Self-pay | Admitting: Internal Medicine

## 2023-04-12 LAB — URINE CULTURE
MICRO NUMBER:: 16069694
SPECIMEN QUALITY:: ADEQUATE

## 2023-04-12 NOTE — Telephone Encounter (Signed)
Patient still does not feel great. She picked up the antibiotic this morning and started it. I let her know that we have not received her urine culture results but that someone will call her when the results comes in.

## 2023-04-12 NOTE — Progress Notes (Signed)
All the discussed results of the culture with the patient.  Patient is currently taking Keflex and feel that her symptoms are better.  She would keep [place] posted regarding her symptoms.

## 2023-04-18 ENCOUNTER — Ambulatory Visit (INDEPENDENT_AMBULATORY_CARE_PROVIDER_SITE_OTHER): Payer: Medicare Other | Admitting: *Deleted

## 2023-04-18 ENCOUNTER — Telehealth: Payer: Self-pay | Admitting: *Deleted

## 2023-04-18 DIAGNOSIS — Z8679 Personal history of other diseases of the circulatory system: Secondary | ICD-10-CM

## 2023-04-18 DIAGNOSIS — I480 Paroxysmal atrial fibrillation: Secondary | ICD-10-CM | POA: Diagnosis not present

## 2023-04-18 DIAGNOSIS — Z5181 Encounter for therapeutic drug level monitoring: Secondary | ICD-10-CM

## 2023-04-18 DIAGNOSIS — Q2112 Patent foramen ovale: Secondary | ICD-10-CM | POA: Diagnosis not present

## 2023-04-18 LAB — POCT INR: INR: 3.7 — AB (ref 2.0–3.0)

## 2023-04-18 NOTE — Telephone Encounter (Signed)
Called pt since INR is due today and the office closes early today. Will await a call back/follow up.

## 2023-04-18 NOTE — Patient Instructions (Signed)
 Description   Spoke with pt and instructed her to take 1/2 tablet of warfarin today then continue taking Warfarin 1/2 tablet daily except for 1 tablet on Mondays, Wednesdays and Fridays.  Recheck INR in 2 weeks.  Coumadin Clinic [phone]

## 2023-05-02 DIAGNOSIS — I480 Paroxysmal atrial fibrillation: Secondary | ICD-10-CM | POA: Diagnosis not present

## 2023-05-02 DIAGNOSIS — Z5181 Encounter for therapeutic drug level monitoring: Secondary | ICD-10-CM | POA: Diagnosis not present

## 2023-05-02 LAB — POCT INR: INR: 2.8 (ref 2.0–3.0)

## 2023-05-03 ENCOUNTER — Ambulatory Visit (INDEPENDENT_AMBULATORY_CARE_PROVIDER_SITE_OTHER): Payer: Self-pay | Admitting: Cardiology

## 2023-05-03 DIAGNOSIS — Z5181 Encounter for therapeutic drug level monitoring: Secondary | ICD-10-CM | POA: Diagnosis not present

## 2023-05-03 NOTE — Patient Instructions (Signed)
Description   Spoke with pt and instructed her to continue taking Warfarin 1/2 tablet daily except for 1 tablet on Mondays, Wednesdays and Fridays.  Recheck INR in 2 weeks.  Coumadin Clinic [phone]

## 2023-05-09 ENCOUNTER — Encounter: Payer: Self-pay | Admitting: Internal Medicine

## 2023-05-09 ENCOUNTER — Ambulatory Visit
Admission: RE | Admit: 2023-05-09 | Discharge: 2023-05-09 | Disposition: A | Discharge status: Home / Self Care | Attending: Internal Medicine | Admitting: Internal Medicine

## 2023-05-09 ENCOUNTER — Ambulatory Visit
Admission: RE | Admit: 2023-05-09 | Discharge: 2023-05-09 | Disposition: A | Discharge status: Home / Self Care | Source: Ambulatory Visit | Attending: Internal Medicine | Admitting: Internal Medicine

## 2023-05-09 ENCOUNTER — Ambulatory Visit (INDEPENDENT_AMBULATORY_CARE_PROVIDER_SITE_OTHER): Payer: Medicare Other | Admitting: Internal Medicine

## 2023-05-09 VITALS — BP 136/70 | HR 75 | Ht 68.0 in | Wt 161.4 lb

## 2023-05-09 DIAGNOSIS — M79671 Pain in right foot: Secondary | ICD-10-CM

## 2023-05-09 DIAGNOSIS — T733XXA Exhaustion due to excessive exertion, initial encounter: Secondary | ICD-10-CM

## 2023-05-09 DIAGNOSIS — M818 Other osteoporosis without current pathological fracture: Secondary | ICD-10-CM | POA: Diagnosis not present

## 2023-05-09 DIAGNOSIS — Q2112 Patent foramen ovale: Secondary | ICD-10-CM | POA: Diagnosis not present

## 2023-05-09 DIAGNOSIS — I480 Paroxysmal atrial fibrillation: Secondary | ICD-10-CM

## 2023-05-09 DIAGNOSIS — M19071 Primary osteoarthritis, right ankle and foot: Secondary | ICD-10-CM | POA: Diagnosis not present

## 2023-05-09 DIAGNOSIS — R7301 Impaired fasting glucose: Secondary | ICD-10-CM

## 2023-05-09 DIAGNOSIS — E7800 Pure hypercholesterolemia, unspecified: Secondary | ICD-10-CM | POA: Diagnosis not present

## 2023-05-09 DIAGNOSIS — Z8739 Personal history of other diseases of the musculoskeletal system and connective tissue: Secondary | ICD-10-CM | POA: Diagnosis not present

## 2023-05-09 DIAGNOSIS — D751 Secondary polycythemia: Secondary | ICD-10-CM

## 2023-05-09 DIAGNOSIS — I10 Essential (primary) hypertension: Secondary | ICD-10-CM | POA: Diagnosis not present

## 2023-05-09 DIAGNOSIS — M25571 Pain in right ankle and joints of right foot: Secondary | ICD-10-CM | POA: Diagnosis not present

## 2023-05-09 DIAGNOSIS — Z1211 Encounter for screening for malignant neoplasm of colon: Secondary | ICD-10-CM

## 2023-05-09 LAB — CBC WITH DIFFERENTIAL/PLATELET
Basophils Absolute: 0 10*3/uL (ref 0.0–0.1)
Basophils Relative: 0.6 % (ref 0.0–3.0)
Eosinophils Absolute: 0.2 10*3/uL (ref 0.0–0.7)
Eosinophils Relative: 3.6 % (ref 0.0–5.0)
HCT: 43.1 % (ref 36.0–46.0)
Hemoglobin: 14.8 g/dL (ref 12.0–15.0)
Lymphocytes Relative: 30.2 % (ref 12.0–46.0)
Lymphs Abs: 1.6 10*3/uL (ref 0.7–4.0)
MCHC: 34.4 g/dL (ref 30.0–36.0)
MCV: 92 fl (ref 78.0–100.0)
Monocytes Absolute: 0.6 10*3/uL (ref 0.1–1.0)
Monocytes Relative: 11.8 % (ref 3.0–12.0)
Neutro Abs: 2.9 10*3/uL (ref 1.4–7.7)
Neutrophils Relative %: 53.8 % (ref 43.0–77.0)
Platelets: 213 10*3/uL (ref 150.0–400.0)
RBC: 4.68 Mil/uL (ref 3.87–5.11)
RDW: 13 % (ref 11.5–15.5)
WBC: 5.3 10*3/uL (ref 4.0–10.5)

## 2023-05-09 LAB — COMPREHENSIVE METABOLIC PANEL
ALT: 15 U/L (ref 0–35)
AST: 18 U/L (ref 0–37)
Albumin: 4.2 g/dL (ref 3.5–5.2)
Alkaline Phosphatase: 71 U/L (ref 39–117)
BUN: 18 mg/dL (ref 6–23)
CO2: 29 meq/L (ref 19–32)
Calcium: 9.4 mg/dL (ref 8.4–10.5)
Chloride: 101 meq/L (ref 96–112)
Creatinine, Ser: 0.82 mg/dL (ref 0.40–1.20)
GFR: 68.22 mL/min (ref >60.00)
Glucose, Bld: 89 mg/dL (ref 70–99)
Potassium: 3.9 meq/L (ref 3.5–5.1)
Sodium: 138 meq/L (ref 135–145)
Total Bilirubin: 0.6 mg/dL (ref 0.2–1.2)
Total Protein: 7.1 g/dL (ref 6.0–8.3)

## 2023-05-09 LAB — MICROALBUMIN / CREATININE URINE RATIO
Creatinine,U: 23 mg/dL
Microalb Creat Ratio: UNDETERMINED mg/g (ref 0.0–30.0)
Microalb, Ur: <0.7 mg/dL

## 2023-05-09 LAB — LIPID PANEL
Cholesterol: 161 mg/dL (ref 0–200)
HDL: 59.7 mg/dL (ref >39.00)
LDL Cholesterol: 78 mg/dL (ref 0–99)
NonHDL: 101.75
Total CHOL/HDL Ratio: 3
Triglycerides: 118 mg/dL (ref 0.0–149.0)
VLDL: 23.6 mg/dL (ref 0.0–40.0)

## 2023-05-09 LAB — LDL CHOLESTEROL, DIRECT: Direct LDL: 84 mg/dL

## 2023-05-09 LAB — URIC ACID: Uric Acid, Serum: 5.7 mg/dL (ref 2.4–7.0)

## 2023-05-09 LAB — HEMOGLOBIN A1C: Hgb A1c MFr Bld: 5.5 % (ref 4.6–6.5)

## 2023-05-09 LAB — TSH: TSH: 3.04 u[IU]/mL (ref 0.35–5.50)

## 2023-05-09 LAB — SEDIMENTATION RATE: Sed Rate: 8 mm/h (ref 0–30)

## 2023-05-09 MED ORDER — PREDNISONE 10 MG PO TABS: ORAL_TABLET | ORAL | 0 refills | Status: DC

## 2023-05-09 NOTE — Assessment & Plan Note (Signed)
 Well controlled on current regimen. Renal function is due and has been historically  stable, no changes today.  Lab Results  Component Value Date   CREATININE 0.82 05/09/2023   Lab Results  Component Value Date   NA 138 05/09/2023   K 3.9 05/09/2023   CL 101 05/09/2023   CO2 29 05/09/2023

## 2023-05-09 NOTE — Assessment & Plan Note (Signed)
 Managed with rosuvastatin. LDL is < 100  Lab Results  Component Value Date   CHOL 161 05/09/2023   HDL 59.70 05/09/2023   LDLCALC 78 05/09/2023   LDLDIRECT 84.0 05/09/2023   TRIG 118.0 05/09/2023   CHOLHDL 3 05/09/2023   Lab Results  Component Value Date   ALT 15 05/09/2023   AST 18 05/09/2023   ALKPHOS 71 05/09/2023   BILITOT 0.6 05/09/2023

## 2023-05-09 NOTE — Progress Notes (Signed)
 Patient ID: ENA DEMARY, female    DOB: 05/15/1945  Age: 78 y.o. MRN: [id number]  The patient is here for annual follow up  and management of other chronic and acute problems.   The risk factors are reflected in the social history.   The roster of all physicians providing medical care to patient - is listed in the Snapshot section of the chart.   Activities of daily living:  The patient is 100% independent in all ADLs: dressing, toileting, feeding as well as independent mobility   Home safety : The patient has smoke detectors in the home. They wear seatbelts.  There are no unsecured firearms at home. There is no violence in the home.    There is no risks for hepatitis, STDs or HIV. There is no   history of blood transfusion. They have no travel history to infectious disease endemic areas of the world.   The patient has seen their dentist in the last six month. They have seen their eye doctor in the last year. The patinet  denies slight hearing difficulty with regard to whispered voices and some television programs.  They have deferred audiologic testing in the last year.  They do not  have excessive sun exposure. Discussed the need for sun protection: hats, long sleeves and use of sunscreen if there is significant sun exposure.    Diet: the importance of a healthy diet is discussed. They do have a healthy diet.   The benefits of regular aerobic exercise were discussed. The patient  exercises  3 to 5 days per week  for  60 minutes.    Depression screen: there are no signs or vegative symptoms of depression- irritability, change in appetite, anhedonia, sadness/tearfullness.   The following portions of the patient's history were reviewed and updated as appropriate: allergies, current medications, past family history, past medical history,  past surgical history, past social history  and problem list.   Visual acuity was not assessed per patient preference since the patient has regular follow  up with an  ophthalmologist. Hearing and body mass index were assessed and reviewed.    During the course of the visit the patient was educated and counseled about appropriate screening and preventive services including : fall prevention , diabetes screening, nutrition counseling, colorectal cancer screening, and recommended immunizations.    Chief Complaint:  Right mid foot pain:  started hurting 2 days ago,  no recent trauma or unusual physical activity .  Her   Foot is Exquisitely painful , pain has  spread to the heel and ankle.  Has not seen a podiatrist in 3 years but saw [facility] in 2023 and presumed dx was gout,  but she did not return . This is her 3rd episode .  Osteoporosis  she has been on a drug holiday from alendronate since June 2024      Review of Symptoms  Patient denies headache, fevers, malaise, unintentional weight loss, skin rash, eye pain, sinus congestion and sinus pain, sore throat, dysphagia,  hemoptysis , cough, dyspnea, wheezing, chest pain, palpitations, orthopnea, edema, abdominal pain, nausea, melena, diarrhea, constipation, flank pain, dysuria, hematuria, urinary  Frequency, nocturia, numbness, tingling, seizures,  Focal weakness, Loss of consciousness,  Tremor, insomnia, depression, anxiety, and suicidal ideation.    Physical Exam:  BP 136/70   Pulse 75   Ht 5\' 8"  (1.727 m)   Wt 161 lb 6.4 oz (73.2 kg)   SpO2 97%   BMI 24.54 kg/m  Physical Exam Vitals reviewed.  Constitutional:      General: She is not in acute distress.    Appearance: Normal appearance. She is normal weight. She is not ill-appearing, toxic-appearing or diaphoretic.  HENT:     Head: Normocephalic.  Eyes:     General: No scleral icterus.       Right eye: No discharge.        Left eye: No discharge.     Conjunctiva/sclera: Conjunctivae normal.  Cardiovascular:     Rate and Rhythm: Normal rate and regular rhythm.     Heart sounds: Normal heart sounds.  Pulmonary:     Effort:  Pulmonary effort is normal. No respiratory distress.     Breath sounds: Normal breath sounds.  Musculoskeletal:        General: Normal range of motion.  Skin:    General: Skin is warm and dry.  Neurological:     General: No focal deficit present.     Mental Status: She is alert and oriented to person, place, and time. Mental status is at baseline.  Psychiatric:        Mood and Affect: Mood normal.        Behavior: Behavior normal.        Thought Content: Thought content normal.        Judgment: Judgment normal.    Assessment and Plan: Essential hypertension Assessment & Plan: Well controlled on current regimen. Renal function is due and has been historically  stable, no changes today.  Lab Results  Component Value Date   CREATININE 0.82 05/09/2023   Lab Results  Component Value Date   NA 138 05/09/2023   K 3.9 05/09/2023   CL 101 05/09/2023   CO2 29 05/09/2023     Orders: -     Microalbumin / creatinine urine ratio -     Comprehensive metabolic panel  Pure hypercholesterolemia Assessment & Plan: Managed with rosuvastatin. LDL is < 100  Lab Results  Component Value Date   CHOL 161 05/09/2023   HDL 59.70 05/09/2023   LDLCALC 78 05/09/2023   LDLDIRECT 84.0 05/09/2023   TRIG 118.0 05/09/2023   CHOLHDL 3 05/09/2023   Lab Results  Component Value Date   ALT 15 05/09/2023   AST 18 05/09/2023   ALKPHOS 71 05/09/2023   BILITOT 0.6 05/09/2023     Orders: -     Lipid panel -     LDL cholesterol, direct -     TSH  Fatigue due to excessive exertion, initial encounter  Impaired fasting glucose -     Hemoglobin A1c -     Comprehensive metabolic panel  Colon cancer screening -     Cologuard  Secondary erythrocytosis Assessment & Plan: She was evaluated repeatedly by oncology until Dec 2019 when she requested no further follow up  hgb is normal  today   Lab Results  Component Value Date   WBC 5.3 05/09/2023   HGB 14.8 05/09/2023   HCT 43.1 05/09/2023    MCV 92.0 05/09/2023   PLT 213.0 05/09/2023     Orders: -     CBC with Differential/Platelet  PFO (patent foramen ovale) Assessment & Plan: Embolic stroke risk managed with coumadin  AND BABY ASA .  Marland Kitchen INR managed by [facility]    Paroxysmal atrial fibrillation [facility]) Assessment & Plan: Exam is c/w NSR.  Managed by cardiology with metoprolol and warfarin for embolic stroke risk mitigation/history of CVA    Other osteoporosis  without current pathological fracture Assessment & Plan: Managed with alendronate since  2018.  SS increase in density of spine and left hip is noted on   Repeat DEXA April 2022 and now in the steopenia range.  .  She has been on an alendronate holiday since June 2024.  Will repeat DEXA June 2025    Orders: -     DG Bone Density; Future  Acute pain of right foot Assessment & Plan: Third episode ,  last one was treated with prednisone and she was seen by [facility] ; gout suspected.  Checking plain films of foot, and ESR/uric acid . Prednisone taper given   Orders: -     Sedimentation rate -     Uric acid -     DG Foot Complete Right; Future  History of gout Assessment & Plan: Current pain episode suspected to be gout but not supported by a  normal ESR or uric acid level. Plain films ordered . Prednisone refilled.    Lab Results  Component Value Date   ESRSEDRATE 8 05/09/2023   Lab Results  Component Value Date   LABURIC 5.7 05/09/2023      Other orders -     predniSONE; 6 tablets on Day 1 , then reduce by 1 tablet daily until gone  Dispense: 21 tablet; Refill: 0   A total of 40 minutes was spent with patient more than half of which was spent in counseling patient on the above mentioned issues , reviewing and explaining recent labs and imaging studies done, and coordination of care.   Return in about 1 year (around 05/08/2024).  Sherlene Shams, MD

## 2023-05-09 NOTE — Assessment & Plan Note (Signed)
Exam is c/w NSR.  Managed by cardiology with metoprolol and warfarin for embolic stroke risk mitigation/history of CVA

## 2023-05-09 NOTE — Assessment & Plan Note (Signed)
 Managed with alendronate since  2018.  SS increase in density of spine and left hip is noted on   Repeat DEXA April 2022 and now in the steopenia range.  .  She has been on an alendronate holiday since June 2024.  Will repeat DEXA June 2025

## 2023-05-09 NOTE — Assessment & Plan Note (Signed)
Embolic stroke risk managed with coumadin  AND BABY ASA .  Marland Kitchen INR managed by Dr [facility]  ?

## 2023-05-09 NOTE — Assessment & Plan Note (Signed)
 She was evaluated repeatedly by oncology until Dec 2019 when she requested no further follow up  hgb is normal  today   Lab Results  Component Value Date   WBC 5.3 05/09/2023   HGB 14.8 05/09/2023   HCT 43.1 05/09/2023   MCV 92.0 05/09/2023   PLT 213.0 05/09/2023

## 2023-05-09 NOTE — Patient Instructions (Addendum)
 Daily use of Probiotics for  3 weeks is advised to reduce risk of C dificile colitis ANY TIME  YOU ARE GIVEN AN ANTIBIOTIC   I do recommend the RSV vaccine for you, .  It is now available at your pharmacy  and will protect you against the Respiratory Syncytial Virus    For the foot pain   Prednisone taper  and tylenol for your  foot pain.  I wouldl like you to have x rays today at Imaging center on [address] can add up to 2000 mg of acetominophen (tylenol) every day safely  In divided doses (500 mg every 6 hours  Or 1000 mg every 12 hours.)   Cologuard coming to you soon   DEXA : PLEASE CALL [facility] and schedule in July

## 2023-05-09 NOTE — Assessment & Plan Note (Signed)
 Current pain episode suspected to be gout but not supported by a  normal ESR or uric acid level. Plain films ordered . Prednisone refilled.    Lab Results  Component Value Date   ESRSEDRATE 8 05/09/2023   Lab Results  Component Value Date   LABURIC 5.7 05/09/2023

## 2023-05-09 NOTE — Assessment & Plan Note (Signed)
 Third episode ,  last one was treated with prednisone and she was seen by [facility] ; gout suspected.  Checking plain films of foot, and ESR/uric acid . Prednisone taper given

## 2023-05-10 ENCOUNTER — Encounter: Payer: Self-pay | Admitting: Internal Medicine

## 2023-05-11 ENCOUNTER — Telehealth: Payer: Self-pay

## 2023-05-11 NOTE — Telephone Encounter (Signed)
 I spoke to patient who started Prednisone 10 mg tablets taper for 1 week.  I instructed her to only take 0.5 tablets while on medication.  She verbalized understanding.

## 2023-05-15 DIAGNOSIS — Z1211 Encounter for screening for malignant neoplasm of colon: Secondary | ICD-10-CM | POA: Diagnosis not present

## 2023-05-16 ENCOUNTER — Other Ambulatory Visit: Payer: Self-pay | Admitting: Internal Medicine

## 2023-05-16 ENCOUNTER — Ambulatory Visit (INDEPENDENT_AMBULATORY_CARE_PROVIDER_SITE_OTHER): Payer: Self-pay

## 2023-05-16 DIAGNOSIS — Z5181 Encounter for therapeutic drug level monitoring: Secondary | ICD-10-CM | POA: Diagnosis not present

## 2023-05-16 LAB — POCT INR: INR: 2.5 (ref 2.0–3.0)

## 2023-05-16 NOTE — Progress Notes (Signed)
 This encounter was created in error - please disregard.

## 2023-05-16 NOTE — Patient Instructions (Signed)
 Description   Spoke with pt and instructed her to resume taking Warfarin 1/2 tablet daily except for 1 tablet on Mondays, Wednesdays and Fridays.  Recheck INR in 2 weeks.  Cou[facility]in Clinic [phone]

## 2023-05-19 ENCOUNTER — Encounter: Payer: Self-pay | Admitting: Internal Medicine

## 2023-05-20 LAB — COLOGUARD: COLOGUARD: NEGATIVE

## 2023-05-21 DIAGNOSIS — L814 Other melanin hyperpigmentation: Secondary | ICD-10-CM | POA: Diagnosis not present

## 2023-05-21 DIAGNOSIS — L578 Other skin changes due to chronic exposure to nonionizing radiation: Secondary | ICD-10-CM | POA: Diagnosis not present

## 2023-05-21 DIAGNOSIS — L570 Actinic keratosis: Secondary | ICD-10-CM | POA: Diagnosis not present

## 2023-05-21 DIAGNOSIS — D225 Melanocytic nevi of trunk: Secondary | ICD-10-CM | POA: Diagnosis not present

## 2023-05-21 DIAGNOSIS — L821 Other seborrheic keratosis: Secondary | ICD-10-CM | POA: Diagnosis not present

## 2023-05-30 ENCOUNTER — Ambulatory Visit (INDEPENDENT_AMBULATORY_CARE_PROVIDER_SITE_OTHER)

## 2023-05-30 DIAGNOSIS — Z5181 Encounter for therapeutic drug level monitoring: Secondary | ICD-10-CM

## 2023-05-30 LAB — POCT INR: INR: 3 (ref 2.0–3.0)

## 2023-05-30 NOTE — Patient Instructions (Signed)
Description   Spoke with pt and instructed her to continue taking Warfarin 1/2 tablet daily except for 1 tablet on Mondays, Wednesdays and Fridays.  Recheck INR in 2 weeks.  Coumadin Clinic [phone]

## 2023-06-13 ENCOUNTER — Ambulatory Visit (INDEPENDENT_AMBULATORY_CARE_PROVIDER_SITE_OTHER)

## 2023-06-13 DIAGNOSIS — Z5181 Encounter for therapeutic drug level monitoring: Secondary | ICD-10-CM | POA: Diagnosis not present

## 2023-06-13 LAB — POCT INR: INR: 3.3 — AB (ref 2.0–3.0)

## 2023-06-13 NOTE — Patient Instructions (Signed)
Description   Spoke with pt and instructed her to continue taking Warfarin 1/2 tablet daily except for 1 tablet on Mondays, Wednesdays and Fridays.  Recheck INR in 2 weeks.  Coumadin Clinic [phone]

## 2023-06-19 ENCOUNTER — Ambulatory Visit (INDEPENDENT_AMBULATORY_CARE_PROVIDER_SITE_OTHER): Payer: Medicare Other | Admitting: *Deleted

## 2023-06-19 VITALS — Ht 68.0 in | Wt 155.0 lb

## 2023-06-19 DIAGNOSIS — Z0000 Encounter for general adult medical examination without abnormal findings: Secondary | ICD-10-CM | POA: Diagnosis not present

## 2023-06-19 NOTE — Progress Notes (Signed)
 Subjective:   Kendra [name] is a 78 y.o. who presents for a Medicare Wellness preventive visit.  Visit Complete: Virtual I connected with  Rebecca M Pfarr on 06/19/23 by a audio enabled telemedicine application and verified that I am speaking with the correct person using two identifiers.  Patient Location: Home  Provider Location: Home Office  I discussed the limitations of evaluation and management by telemedicine. The patient expressed understanding and agreed to proceed.  Vital Signs: Because this visit was a virtual/telehealth visit, some criteria may be missing or patient reported. Any vitals not documented were not able to be obtained and vitals that have been documented are patient reported.  VideoDeclined- This patient declined Librarian, academic. Therefore the visit was completed with audio only.  Persons Participating in Visit: Patient.  AWV Questionnaire: Yes: Patient Medicare AWV questionnaire was completed by the patient on 06/15/23; I have confirmed that all information answered by patient is correct and no changes since this date.  Cardiac Risk Factors include: advanced age (>2men, >67 women);dyslipidemia;hypertension     Objective:    Today's Vitals   06/19/23 1015  Weight: 155 lb (70.3 kg)  Height: 5\' 8"  (1.727 m)   Body mass index is 23.57 kg/m.     06/19/2023   10:30 AM 05/29/2022    2:39 PM 05/20/2021    9:24 AM 04/16/2020    9:13 AM 03/26/2020    8:59 AM 10/01/2019   10:06 AM 09/30/2018    9:44 AM  Advanced Directives  Does Patient Have a Medical Advance Directive? No Yes No No No Yes Yes  Type of Furniture conservator/restorer;Living will    Healthcare Power of [place];Living will Healthcare Power of [place];Living will  Does patient want to make changes to medical advance directive?  No - Patient declined    No - Patient declined No - Patient declined  Copy of Healthcare Power of Attorney in Chart?  No -  copy requested    No - copy requested No - copy requested  Would patient like information on creating a medical advance directive? No - Patient declined  No - Patient declined No - Patient declined No - Patient declined      Current Medications (verified) Outpatient Encounter Medications as of 06/19/2023  Medication Sig   aspirin  81 MG tablet Take 81 mg by mouth daily.   cholecalciferol (VITAMIN D ) 1000 units tablet Take 1,000 Units by mouth daily.   furosemide  (LASIX ) 20 MG tablet TAKE 1 TABLET BY MOUTH EVERY DAY   losartan  (COZAAR ) 25 MG tablet TAKE 1 TABLET (25 MG TOTAL) BY MOUTH DAILY.   metoprolol  succinate (TOPROL -XL) 100 MG 24 hr tablet TAKE 1 TABLET (100 MG TOTAL) BY MOUTH DAILY. WITH OR AFTER A MEAL   Multiple Vitamins-Minerals (MULTIVITAMIN WITH MINERALS) tablet Take 1 tablet by mouth daily.   rosuvastatin  (CRESTOR ) 5 MG tablet TAKE 1 TABLET BY MOUTH EVERY DAY   warfarin (COUMADIN ) 5 MG tablet TAKE 1/2 TO 1 TABLET BY MOUTH DAILY AS DIRECTED BY THE COUMADIN  CLINIC   alendronate  (FOSAMAX ) 70 MG tablet TAKE 1 TABLET EVERY 7 DAYS WITH A FULL GLASS WATER ON AN EMPTY STOMACH (Patient not taking: Reported on 06/19/2023)   [DISCONTINUED] predniSONE  (DELTASONE ) 10 MG tablet 6 tablets on Day 1 , then reduce by 1 tablet daily until gone (Patient not taking: Reported on 06/19/2023)   No facility-administered encounter medications on file as of 06/19/2023.    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   Kidney infarction [facility])    PFO (patent foramen ovale)    Secondary erythrocytosis    Secundum ASD    Stroke (HCC) 01/2015   TIA (transient ischemic attack)    as per 07/30/00 note from [facility]    Vitamin D  deficiency    Past Surgical History:  Procedure Laterality Date   APPENDECTOMY  1966   BREAST BIOPSY Right 1991   CARDIAC ELECTROPHYSIOLOGY STUDY AND ABLATION  2002   CATARACT EXTRACTION W/ INTRAOCULAR LENS  IMPLANT, BILATERAL Bilateral    CESAREAN SECTION  1981   fibroid tumor removal  1996   HALLUX VALGUS LAPIDUS Right 04/16/2020   Procedure: HALLUX VALGUS LAPIDUS;  Surgeon: Anell Baptist, DPM;  Location: [facility] ORS;  Service: Podiatry;  Laterality: Right;   RADIOLOGY WITH ANESTHESIA N/A 02/16/2015   Procedure: RADIOLOGY WITH ANESTHESIA;  Surgeon: Medication Radiologist, MD;  Location: [facility] NEURO ORS;  Service: Radiology;  Laterality: N/A;   TEE WITHOUT CARDIOVERSION N/A 03/08/2015   Procedure: TRANSESOPHAGEAL ECHOCARDIOGRAM (TEE);  Surgeon: Liza Riggers, MD;  Location: [facility] ENDOSCOPY;  Service: Cardiovascular;  Laterality: N/A;   TEE WITHOUT CARDIOVERSION N/A 04/28/2015   Procedure: TRANSESOPHAGEAL ECHOCARDIOGRAM (TEE);  Surgeon: Liza Riggers, MD;  Location: 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  Tobacco Use   Smoking status: Never   Smokeless tobacco: Never  Vaping Use   Vaping status: Never Used   Substance and Sexual Activity   Alcohol use: No    Alcohol/week: 0.0 standard drinks of alcohol   Drug use: No   Sexual activity: Not on file  Other Topics Concern   Not on file  Social History Narrative   ** Merged History Encounter **       Lives with husband   No caffeine drinks    Social Drivers of Corporate investment banker Strain: Low Risk  (06/15/2023)   Overall Financial Resource Strain (CARDIA)    Difficulty of Paying Living Expenses: Not hard at all  Food Insecurity: No Food Insecurity (06/15/2023)   Hunger Vital Sign    Worried About Running Out of Food in the Last Year: Never true    Ran Out of Food in  the Last Year: Never true  Transportation Needs: No Transportation Needs (06/15/2023)   PRAPARE - Administrator, Civil Service (Medical): No    Lack of Transportation (Non-Medical): No  Physical Activity: Sufficiently Active (06/15/2023)   Exercise Vital Sign    Days of Exercise per Week: 5 days    Minutes of Exercise per Session: 60 min  Stress: No Stress Concern Present (06/15/2023)   [employer] of Occupational Health - Occupational Stress Questionnaire    Feeling of Stress : Only a little  Social Connections: Socially Integrated (06/15/2023)   Social Connection and Isolation Panel [NHANES]    Frequency of Communication with Friends and Family: More than three times a week    Frequency of Social Gatherings with Friends and Family: Once a week    Attends Religious Services: More than 4 times per year    Active Member of [employer] or Organizations: Yes    Attends Engineer, structural: More than 4 times per year    Marital Status: Married    Tobacco Counseling Counseling given: Not Answered    Clinical Intake:  Pre-visit preparation completed: Yes  Pain : No/denies pain     BMI - recorded: 23.57 Nutritional Status: BMI of 19-24  Normal Nutritional Risks: None Diabetes: No  Lab Results  Component Value Date   HGBA1C 5.5  05/09/2023   HGBA1C 5.5 05/08/2022   HGBA1C 5.4 05/04/2021     How often do you need to have someone help you when you read instructions, pamphlets, or other written materials from your doctor or pharmacy?: 1 - Never  Interpreter Needed?: No  Information entered by :: R. Mattison Stuckey LPN   Activities of Daily Living     06/15/2023    5:46 PM  In your present state of health, do you have any difficulty performing the following activities:  Hearing? 0  Vision? 0  Difficulty concentrating or making decisions? 0  Walking or climbing stairs? 0  Dressing or bathing? 0  Doing errands, shopping? 0  Preparing Food and eating ? N  Using the Toilet? N  In the past six months, have you accidently leaked urine? N  Do you have problems with loss of bowel control? N  Managing your Medications? N  Managing your Finances? N  Housekeeping or managing your Housekeeping? N    Patient Care Team: Thersia Flax, MD as PCP - General (Internal Medicine) Tammie Fall, MD as PCP - Electrophysiology (Cardiology) Lisabeth Rider, MD as Consulting Physician (Neurology) Scotty Cyphers, MD as Consulting Physician (Hematology) Annabell Baron as Consulting Physician (Cardiology)  Indicate any recent Medical Services you may have received from other than [facility] providers in the past year (date may be approximate).     Assessment:   This is a routine wellness examination for Kimani.  Hearing/Vision screen Hearing Screening - Comments:: No issues Vision Screening - Comments:: readers   Goals Addressed             This Visit's Progress    Patient Stated       Wants to exercise more and watch her grandchildren play basketball and travel       Depression Screen     06/19/2023   10:23 AM 05/09/2023    9:41 AM 05/29/2022    2:42 PM 05/08/2022    9:10 AM 05/20/2021    9:23 AM 05/04/2021    9:42 AM 10/01/2019   10:05 AM  PHQ 2/9 Scores  PHQ - 2 Score 0 0 0 0 0 0 0  PHQ- 9 Score 1           Fall Risk     06/15/2023    5:46 PM 05/09/2023    9:40 AM 05/29/2022    2:41 PM 05/08/2022    9:10 AM 05/20/2021    9:25 AM  Fall Risk   Falls in the past year? 0 0 0 0 0  Number falls in past yr: 0 0 0 0 0  Injury with Fall? 0 0 0 0   Risk for fall due to : No Fall Risks No Fall Risks  No Fall Risks   Follow up Falls prevention discussed;Falls evaluation completed Falls evaluation completed Falls evaluation completed;Falls prevention discussed Falls evaluation completed Falls evaluation completed    MEDICARE RISK AT HOME:  Medicare Risk at Home Any stairs in or around the home?: (Patient-Rptd) Yes If so, are there any without handrails?: (Patient-Rptd) No Home free of loose throw rugs in walkways, pet beds, electrical cords, etc?: (Patient-Rptd) Yes Adequate lighting in your home to reduce risk of falls?: (Patient-Rptd) Yes Life alert?: (Patient-Rptd) No Use of a cane, walker or w/c?: (Patient-Rptd) No Grab bars in the bathroom?: (Patient-Rptd) Yes Shower chair or bench in shower?: No Elevated toilet seat or a handicapped toilet?: Yes  TIMED UP AND GO:  Was the test performed?  No  Cognitive Function: 6CIT completed    10/01/2019   10:08 AM  MMSE - Mini Mental State Exam  Not completed: Unable to complete        06/19/2023   10:30 AM 05/29/2022    2:43 PM 05/20/2021    9:29 AM 09/30/2018    9:45 AM  6CIT Screen  What Year? 0 points 0 points  0 points  What month? 0 points 0 points  0 points  What time? 0 points 0 points  0 points  Count back from 20 0 points 0 points  0 points  Months in reverse 0 points 0 points 0 points 0 points  Repeat phrase 0 points 0 points  0 points  Total Score 0 points 0 points  0 points    Immunizations Immunization History  Administered Date(s) Administered   Hep A, Unspecified 08/21/2002, 02/11/2003   Influenza Split 11/28/2014   Influenza Whole 11/27/2008   Influenza, High Dose Seasonal PF 11/10/2015   Influenza, Quadrivalent,  Recombinant, Inj, Pf 12/31/2019   Influenza, Seasonal, Injecte, Preservative Fre 11/14/2007   Influenza-Unspecified 12/12/2016, 11/11/2017, 12/12/2018, 01/02/2021, 12/29/2022   Moderna Sars-Covid-2 Vaccination 03/14/2019, 04/11/2019, 12/23/2019, 07/07/2020   Pneumococcal Conjugate-13 04/21/2014   Pneumococcal Polysaccharide-23 11/27/2008, 06/21/2010, 03/15/2017   Rabies, IM 12/03/2020, 12/06/2020, 12/10/2020, 12/17/2020   Td 02/27/2002   Tdap 12/11/2013, 04/26/2014   Typhoid Inactivated 09/08/2002   Zoster Recombinant(Shingrix) 11/11/2017, 05/01/2018   Zoster, Live 04/26/2005, 06/20/2006    Screening Tests Health Maintenance  Topic Date Due   COVID-19 Vaccine (5 - 2024-25 season) 10/29/2022   Medicare Annual Wellness (AWV)  05/29/2023   INFLUENZA VACCINE  09/28/2023   MAMMOGRAM  04/10/2024   DTaP/Tdap/Td (4 - Td or Tdap) 04/26/2024   Pneumonia Vaccine 74+ Years old  Completed   DEXA SCAN  Completed   Hepatitis C Screening  Completed   Zoster Vaccines- Shingrix  Completed   HPV VACCINES  Aged Out   Meningococcal B Vaccine  Aged Out   Colonoscopy  Discontinued    Health Maintenance  Health Maintenance Due  Topic Date Due  COVID-19 Vaccine (5 - 2024-25 season) 10/29/2022   Medicare Annual Wellness (AWV)  05/29/2023   Health Maintenance Items Addressed: Patient declines covid vaccine. Patient stated that she is scheduled for her bone density 08/2023  Additional Screening:  Vision Screening: Recommended annual ophthalmology exams for early detection of glaucoma and other disorders of the eye.Up to date [facility] Opthalmology   Dental Screening: Recommended annual dental exams for proper oral hygiene  Community Resource Referral / Chronic Care Management: CRR required this visit?  No   CCM required this visit?  No     Plan:     I have personally reviewed and noted the following in the patient's chart:   Medical and social history Use of alcohol, tobacco or  illicit drugs  Current medications and supplements including opioid prescriptions. Patient is not currently taking opioid prescriptions. Functional ability and status Nutritional status Physical activity Advanced directives List of other physicians Hospitalizations, surgeries, and ER visits in previous 12 months Vitals Screenings to include cognitive, depression, and falls Referrals and appointments  In addition, I have reviewed and discussed with patient certain preventive protocols, quality metrics, and best practice recommendations. A written personalized care plan for preventive services as well as general preventive health recommendations were provided to patient.     Felicitas Horse, LPN   6/57/8469   After Visit Summary: (MyChart) Due to this being a telephonic visit, the after visit summary with patients personalized plan was offered to patient via MyChart   Notes: Nothing significant to report at this time.

## 2023-06-19 NOTE — Patient Instructions (Signed)
 Ms. Vandervort , Thank you for taking time to come for your Medicare Wellness Visit. I appreciate your ongoing commitment to your health goals. Please review the following plan we discussed and let me know if I can assist you in the future.   Referrals/Orders/Follow-Ups/Clinician Recommendations: None  This is a list of the screening recommended for you and due dates:  Health Maintenance  Topic Date Due   COVID-19 Vaccine (5 - 2024-25 season) 10/29/2022   Flu Shot  09/28/2023   Mammogram  04/10/2024   DTaP/Tdap/Td vaccine (4 - Td or Tdap) 04/26/2024   Medicare Annual Wellness Visit  06/18/2024   Pneumonia Vaccine  Completed   DEXA scan (bone density measurement)  Completed   Hepatitis C Screening  Completed   Zoster (Shingles) Vaccine  Completed   HPV Vaccine  Aged Out   Meningitis B Vaccine  Aged Out   Colon Cancer Screening  Discontinued    Advanced directives: (Declined) Advance directive discussed with you today. Even though you declined this today, please call our office should you change your mind, and we can give you the proper paperwork for you to fill out.  Next Medicare Annual Wellness Visit scheduled for next year: Yes 06/25/24 @ 10:10

## 2023-06-27 ENCOUNTER — Telehealth: Payer: Self-pay | Admitting: *Deleted

## 2023-06-27 ENCOUNTER — Ambulatory Visit (INDEPENDENT_AMBULATORY_CARE_PROVIDER_SITE_OTHER): Payer: Self-pay

## 2023-06-27 DIAGNOSIS — Z8679 Personal history of other diseases of the circulatory system: Secondary | ICD-10-CM

## 2023-06-27 DIAGNOSIS — Q2112 Patent foramen ovale: Secondary | ICD-10-CM

## 2023-06-27 DIAGNOSIS — Z5181 Encounter for therapeutic drug level monitoring: Secondary | ICD-10-CM | POA: Diagnosis not present

## 2023-06-27 DIAGNOSIS — I480 Paroxysmal atrial fibrillation: Secondary | ICD-10-CM

## 2023-06-27 LAB — POCT INR: INR: 2.9 (ref 2.0–3.0)

## 2023-06-27 NOTE — Telephone Encounter (Signed)
 Called pt to remind her that INR is due; there was no answer so left her a message to call back regarding this.

## 2023-06-27 NOTE — Patient Instructions (Signed)
Description   Spoke with pt and instructed her to continue taking Warfarin 1/2 tablet daily except for 1 tablet on Mondays, Wednesdays and Fridays.  Recheck INR in 2 weeks.  Coumadin Clinic [phone]

## 2023-07-11 ENCOUNTER — Ambulatory Visit (INDEPENDENT_AMBULATORY_CARE_PROVIDER_SITE_OTHER)

## 2023-07-11 DIAGNOSIS — Z5181 Encounter for therapeutic drug level monitoring: Secondary | ICD-10-CM

## 2023-07-11 LAB — POCT INR: INR: 3.4 — AB (ref 2.0–3.0)

## 2023-07-11 NOTE — Patient Instructions (Signed)
Description   Spoke with pt and instructed her to continue taking Warfarin 1/2 tablet daily except for 1 tablet on Mondays, Wednesdays and Fridays.  Recheck INR in 2 weeks.  Coumadin Clinic [phone]

## 2023-07-20 ENCOUNTER — Telehealth: Payer: Self-pay

## 2023-07-20 ENCOUNTER — Encounter: Payer: Self-pay | Admitting: Internal Medicine

## 2023-07-20 NOTE — Telephone Encounter (Signed)
 Copied from CRM [id number]. Topic: Clinical - Request for Lab/Test Order >> Jul 19, 2023  4:43 PM [place] wrote: Reason for CRM: scheduled a bone density with [facility] in [place]- they need an order from Dr Madelon Scheuermann, they said they don't know if Medicare will pay for it, please call patient [phone]

## 2023-07-25 ENCOUNTER — Telehealth: Payer: Self-pay

## 2023-07-25 ENCOUNTER — Ambulatory Visit (INDEPENDENT_AMBULATORY_CARE_PROVIDER_SITE_OTHER): Payer: Self-pay

## 2023-07-25 DIAGNOSIS — Z5181 Encounter for therapeutic drug level monitoring: Secondary | ICD-10-CM

## 2023-07-25 LAB — POCT INR: INR: 3.3 — AB (ref 2.0–3.0)

## 2023-07-25 NOTE — Patient Instructions (Signed)
Description   Spoke with pt and instructed her to continue taking Warfarin 1/2 tablet daily except for 1 tablet on Mondays, Wednesdays and Fridays.  Recheck INR in 2 weeks.  Coumadin Clinic [phone]

## 2023-07-25 NOTE — Telephone Encounter (Signed)
 INR due. Called pt, no answer. Left message on voicemail.

## 2023-08-08 ENCOUNTER — Ambulatory Visit (INDEPENDENT_AMBULATORY_CARE_PROVIDER_SITE_OTHER)

## 2023-08-08 DIAGNOSIS — Z5181 Encounter for therapeutic drug level monitoring: Secondary | ICD-10-CM

## 2023-08-08 LAB — POCT INR: INR: 4 — AB (ref 2.0–3.0)

## 2023-08-08 NOTE — Patient Instructions (Signed)
 Description   Spoke with pt and instructed her to HOLD today's dose and then continue taking Warfarin 1/2 tablet daily except for 1 tablet on Mondays, Wednesdays and Fridays.  Recheck INR in 2 weeks.  Coumadin  Clinic [phone]

## 2023-08-22 ENCOUNTER — Ambulatory Visit (INDEPENDENT_AMBULATORY_CARE_PROVIDER_SITE_OTHER)

## 2023-08-22 DIAGNOSIS — I480 Paroxysmal atrial fibrillation: Secondary | ICD-10-CM | POA: Diagnosis not present

## 2023-08-22 DIAGNOSIS — Z5181 Encounter for therapeutic drug level monitoring: Secondary | ICD-10-CM

## 2023-08-22 LAB — POCT INR: INR: 3.2 — AB (ref 2.0–3.0)

## 2023-08-22 NOTE — Patient Instructions (Signed)
Description   Spoke with pt and instructed her to continue taking Warfarin 1/2 tablet daily except for 1 tablet on Mondays, Wednesdays and Fridays.  Recheck INR in 2 weeks.  Coumadin Clinic [phone]

## 2023-08-22 NOTE — Progress Notes (Signed)
Please see anticoagulation encounter.

## 2023-08-27 ENCOUNTER — Other Ambulatory Visit: Payer: Self-pay

## 2023-08-27 MED ORDER — METOPROLOL SUCCINATE ER 100 MG PO TB24: 100.0000 mg | ORAL_TABLET | Freq: Every day | ORAL | 0 refills | Status: DC

## 2023-09-03 ENCOUNTER — Other Ambulatory Visit: Payer: Self-pay

## 2023-09-03 MED ORDER — FUROSEMIDE 20 MG PO TABS: 20.0000 mg | ORAL_TABLET | Freq: Every day | ORAL | 0 refills | Status: DC

## 2023-09-03 MED ORDER — LOSARTAN POTASSIUM 25 MG PO TABS: 25.0000 mg | ORAL_TABLET | Freq: Every day | ORAL | 0 refills | Status: DC

## 2023-09-05 ENCOUNTER — Ambulatory Visit (INDEPENDENT_AMBULATORY_CARE_PROVIDER_SITE_OTHER): Payer: Self-pay

## 2023-09-05 DIAGNOSIS — Q2112 Patent foramen ovale: Secondary | ICD-10-CM

## 2023-09-05 DIAGNOSIS — I480 Paroxysmal atrial fibrillation: Secondary | ICD-10-CM

## 2023-09-05 DIAGNOSIS — Z8679 Personal history of other diseases of the circulatory system: Secondary | ICD-10-CM

## 2023-09-05 DIAGNOSIS — Z711 Person with feared health complaint in whom no diagnosis is made: Secondary | ICD-10-CM | POA: Diagnosis not present

## 2023-09-05 DIAGNOSIS — Z5181 Encounter for therapeutic drug level monitoring: Secondary | ICD-10-CM

## 2023-09-05 LAB — POCT INR: INR: 3 (ref 2.0–3.0)

## 2023-09-05 NOTE — Patient Instructions (Signed)
Description   Spoke with pt and instructed her to continue taking Warfarin 1/2 tablet daily except for 1 tablet on Mondays, Wednesdays and Fridays.  Recheck INR in 2 weeks.  Coumadin Clinic [phone]

## 2023-09-12 DIAGNOSIS — M8588 Other specified disorders of bone density and structure, other site: Secondary | ICD-10-CM | POA: Diagnosis not present

## 2023-09-12 LAB — HM DEXA SCAN

## 2023-09-13 ENCOUNTER — Ambulatory Visit: Attending: Internal Medicine | Admitting: Internal Medicine

## 2023-09-13 ENCOUNTER — Encounter: Payer: Self-pay | Admitting: Internal Medicine

## 2023-09-13 VITALS — BP 128/77 | HR 71 | Ht 68.0 in | Wt 155.0 lb

## 2023-09-13 DIAGNOSIS — I480 Paroxysmal atrial fibrillation: Secondary | ICD-10-CM | POA: Insufficient documentation

## 2023-09-13 NOTE — Progress Notes (Unsigned)
 HPI Kendra [name] returns today for followup. She is a pleasant 78 yo woman with a h/o HTN, remote atrial fib and flutter, recurrent thromboembolic events usually when her INR was subtherapeutic or once when she forgot to take her pradaxa  for 4 days. She has done well in the interim except for some dyspnea especially walking up hill[place] She is using coumadin .  She denies chest pain or edema. No palpitations. She previously had swelling on amlodipine . She had an echo last year showing mild AS/AI, and normal LV function. Allergies  Allergen Reactions   Amiodarone Hcl Nausea Only and Rash     Current Outpatient Medications  Medication Sig Dispense Refill   aspirin  81 MG tablet Take 81 mg by mouth daily.     cholecalciferol (VITAMIN D ) 1000 units tablet Take 1,000 Units by mouth daily.     furosemide  (LASIX ) 20 MG tablet Take 1 tablet (20 mg total) by mouth daily. 90 tablet 0   losartan  (COZAAR ) 25 MG tablet Take 1 tablet (25 mg total) by mouth daily. 90 tablet 0   metoprolol  succinate (TOPROL -XL) 100 MG 24 hr tablet Take 1 tablet (100 mg total) by mouth daily. With or after a meal 90 tablet 0   Multiple Vitamins-Minerals (MULTIVITAMIN WITH MINERALS) tablet Take 1 tablet by mouth daily.     rosuvastatin  (CRESTOR ) 5 MG tablet TAKE 1 TABLET BY MOUTH EVERY DAY 90 tablet 3   warfarin (COUMADIN ) 5 MG tablet TAKE 1/2 TO 1 TABLET BY MOUTH DAILY AS DIRECTED BY THE COUMADIN  CLINIC 90 tablet 1   alendronate  (FOSAMAX ) 70 MG tablet TAKE 1 TABLET EVERY 7 DAYS WITH A FULL GLASS WATER ON AN EMPTY STOMACH (Patient not taking: Reported on 09/13/2023) 12 tablet 2   No current facility-administered medications for this visit.     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   Past Surgical History:  Procedure Laterality Date   APPENDECTOMY  1966   BREAST BIOPSY Right 1991   CARDIAC ELECTROPHYSIOLOGY STUDY AND ABLATION  2002   CATARACT EXTRACTION W/ INTRAOCULAR LENS  IMPLANT, BILATERAL Bilateral    CESAREAN SECTION  1981   fibroid tumor removal  1996   HALLUX VALGUS LAPIDUS Right 04/16/2020   Procedure: HALLUX VALGUS LAPIDUS;  Surgeon: Ashley Soulier, DPM;  Location: [facility] ORS;  Service: Podiatry;  Laterality: Right;   RADIOLOGY WITH ANESTHESIA N/A 02/16/2015   Procedure: RADIOLOGY WITH ANESTHESIA;  Surgeon: Medication Radiologist, MD;  Location: [facility] NEURO ORS;  Service: Radiology;  Laterality: N/A;   TEE WITHOUT CARDIOVERSION N/A 03/08/2015   Procedure: TRANSESOPHAGEAL ECHOCARDIOGRAM (TEE);  Surgeon: Leim VEAR Moose, MD;  Location: [facility] ENDOSCOPY;  Service: Cardiovascular;  Laterality: N/A;   TEE WITHOUT CARDIOVERSION N/A 04/28/2015   Procedure: TRANSESOPHAGEAL ECHOCARDIOGRAM (TEE);  Surgeon: Leim VEAR Moose, MD;  Location: [facility] ENDOSCOPY;  Service: Cardiovascular;  Laterality: N/A;     Family History  Problem Relation Age of Onset   Stroke Maternal Grandmother    Alcohol abuse Father    Lung cancer Father    Other Brother        Emotional Illness   Lung cancer Brother    Colon cancer Neg Hx      Social History   Socioeconomic History   Marital  status: Married    Spouse name: Lynwood   Number of children: 4   Years of education: Not on file   Highest education level: Bachelor's degree (e.g., BA, AB, BS)  Occupational History   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  Tobacco Use   Smoking status: Never   Smokeless tobacco: Never  Vaping Use   Vaping status: Never Used  Substance and Sexual Activity   Alcohol use: No    Alcohol/week: 0.0 standard drinks of alcohol   Drug use: No   Sexual activity: Not on file  Other Topics Concern   Not on file  Social History Narrative   ** Merged History Encounter **       Lives with husband   No caffeine drinks    Social Drivers of Corporate investment banker Strain: Low Risk  (06/15/2023)   Overall Financial Resource Strain (CARDIA)    Difficulty of Paying Living Expenses: Not hard at all  Food Insecurity: No Food Insecurity (06/15/2023)   Hunger Vital Sign    Worried About Running Out of Food in the Last Year: Never true    Ran Out of Food in the Last Year: Never true  Transportation Needs: No Transportation Needs (06/15/2023)   PRAPARE - Administrator, Civil Service (Medical): No    Lack of Transportation (Non-Medical): No  Physical Activity: Sufficiently Active (06/15/2023)   Exercise Vital Sign    Days of Exercise per Week: 5 days    Minutes of Exercise per Session: 60 min  Stress: No Stress Concern Present (06/15/2023)   [employer] of Occupational Health - Occupational Stress Questionnaire    Feeling of Stress : Only a little  Social Connections: Socially Integrated (06/15/2023)   Social Connection and Isolation Panel    Frequency of Communication with Friends and Family: More than three times a week    Frequency of Social Gatherings with Friends and Family: Once a week    Attends Religious Services: More than 4 times per year    Active Member of [employer] or Organizations: Yes    Attends Engineer, structural: More than 4 times per year    Marital  Status: Married  Catering manager Violence: Not At Risk (06/19/2023)   Humiliation, Afraid, Rape, and Kick questionnaire    Fear of Current or Ex-Partner: No    Emotionally Abused: No    Physically Abused: No    Sexually Abused: No     BP 128/77   Pulse 71   Ht 5' 8 (1.727 m)   Wt 155 lb (70.3 kg)   SpO2 97%   BMI 23.57 kg/m   Physical Exam:  Well appearing NAD  HEENT: Unremarkable Neck:  No JVD, no thyromegally Lymphatics:  No adenopathy Back:  No CVA tenderness Lungs:  Clear with no wheezes HEART:  Regular rate rhythm, no murmurs, no rubs, no clicks Abd:  soft, positive bowel sounds, no organomegally, no rebound, no guarding Ext:  2 plus pulses, no edema, no cyanosis, no clubbing Skin:  No rashes no nodules Neuro:  CN II through XII intact, motor grossly intact  EKG - nsr  DEVICE  Normal device function.  See PaceArt for details.   Assess/Plan:  Coags -she will continue systemic anti-coagulation with Warfarin.  2. Dyslipidemia - she will continue her crestor  at low dose 3. HTN - her bp is well controlled. No change. 4. Atrial fib - she has not had any in the last year, at least by her symptoms.  5. Dyspnea - we discussed possible etiologies. I suspect normal for age. She could have chronotropic incompetence and I offered her a Zio but she declined.   Danelle Waddell ROLLA Danelle Fielding Mault,MD

## 2023-09-13 NOTE — Patient Instructions (Signed)
 Medication Instructions:  Your physician recommends that you continue on your current medications as directed. Please refer to the Current Medication list given to you today.  *If you need a refill on your cardiac medications before your next appointment, please call your pharmacy*  Lab Work: None ordered.  You may go to any [facility] Location for your lab work:  [facility] - [address] ([facility]) - [address] [id number] [address]  [place] - [address]   [place]  - [address]   [place] - [address] - [address] [employer]  - [address] ([facility]   If you have labs (blood work) drawn today and your tests are completely normal, you will receive your results only by: [employer] (if you have MyChart)  If you have any lab test that is abnormal or we need to change your treatment, we will call you or send a MyChart message to review the results.  Testing/Procedures: None ordered.  Follow-Up: At [facility], you and your health needs are our priority.  As part of our continuing mission to provide you with exceptional heart care, we have created designated Provider Care Teams.  These Care Teams include your primary Cardiologist (physician) and Advanced Practice Providers (APPs -  Physician Assistants and Nurse Practitioners) who all work together to provide you with the care you need, when you need it.  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 This monitoring reduces the number of office visits required to check your  device to one time per year. It allows us  to keep an eye on the functioning of your device to ensure it is working properly.

## 2023-09-17 ENCOUNTER — Encounter: Payer: Self-pay | Admitting: Internal Medicine

## 2023-09-19 ENCOUNTER — Ambulatory Visit (INDEPENDENT_AMBULATORY_CARE_PROVIDER_SITE_OTHER)

## 2023-09-19 ENCOUNTER — Ambulatory Visit: Payer: Self-pay | Admitting: Internal Medicine

## 2023-09-19 DIAGNOSIS — Q2112 Patent foramen ovale: Secondary | ICD-10-CM

## 2023-09-19 DIAGNOSIS — Z8679 Personal history of other diseases of the circulatory system: Secondary | ICD-10-CM

## 2023-09-19 DIAGNOSIS — I480 Paroxysmal atrial fibrillation: Secondary | ICD-10-CM

## 2023-09-19 DIAGNOSIS — Z5181 Encounter for therapeutic drug level monitoring: Secondary | ICD-10-CM

## 2023-09-19 LAB — POCT INR: INR: 3.2 — AB (ref 2.0–3.0)

## 2023-09-19 NOTE — Progress Notes (Signed)
 INR 3.2, Please see anticoagulation encounter

## 2023-09-19 NOTE — Patient Instructions (Signed)
Description   Spoke with pt and instructed her to continue taking Warfarin 1/2 tablet daily except for 1 tablet on Mondays, Wednesdays and Fridays.  Recheck INR in 2 weeks.  Coumadin Clinic [phone]

## 2023-09-25 ENCOUNTER — Other Ambulatory Visit: Payer: Self-pay | Admitting: Internal Medicine

## 2023-10-03 ENCOUNTER — Ambulatory Visit (INDEPENDENT_AMBULATORY_CARE_PROVIDER_SITE_OTHER)

## 2023-10-03 DIAGNOSIS — Z5181 Encounter for therapeutic drug level monitoring: Secondary | ICD-10-CM

## 2023-10-03 LAB — POCT INR: INR: 3.4 — AB (ref 2.0–3.0)

## 2023-10-03 NOTE — Patient Instructions (Signed)
Description   Spoke with pt and instructed her to continue taking Warfarin 1/2 tablet daily except for 1 tablet on Mondays, Wednesdays and Fridays.  Recheck INR in 2 weeks.  Coumadin Clinic [phone]

## 2023-10-03 NOTE — Progress Notes (Signed)
 INR 3.4  Please see anticoagulation encounter

## 2023-10-17 ENCOUNTER — Ambulatory Visit (INDEPENDENT_AMBULATORY_CARE_PROVIDER_SITE_OTHER)

## 2023-10-17 DIAGNOSIS — Z5181 Encounter for therapeutic drug level monitoring: Secondary | ICD-10-CM | POA: Diagnosis not present

## 2023-10-17 DIAGNOSIS — I480 Paroxysmal atrial fibrillation: Secondary | ICD-10-CM | POA: Diagnosis not present

## 2023-10-17 LAB — POCT INR: INR: 3.3 — AB (ref 2.0–3.0)

## 2023-10-17 NOTE — Patient Instructions (Signed)
Description   Spoke with pt and instructed her to continue taking Warfarin 1/2 tablet daily except for 1 tablet on Mondays, Wednesdays and Fridays.  Recheck INR in 2 weeks.  Coumadin Clinic [phone]

## 2023-10-17 NOTE — Progress Notes (Signed)
 INR 3.3; Please see anticoagulation encounter

## 2023-10-31 ENCOUNTER — Ambulatory Visit (INDEPENDENT_AMBULATORY_CARE_PROVIDER_SITE_OTHER): Admitting: Internal Medicine

## 2023-10-31 DIAGNOSIS — I480 Paroxysmal atrial fibrillation: Secondary | ICD-10-CM

## 2023-10-31 DIAGNOSIS — Q2112 Patent foramen ovale: Secondary | ICD-10-CM

## 2023-10-31 DIAGNOSIS — Z5181 Encounter for therapeutic drug level monitoring: Secondary | ICD-10-CM

## 2023-10-31 DIAGNOSIS — Z8679 Personal history of other diseases of the circulatory system: Secondary | ICD-10-CM

## 2023-10-31 LAB — POCT INR: INR: 2.8 (ref 2.0–3.0)

## 2023-11-14 ENCOUNTER — Ambulatory Visit (INDEPENDENT_AMBULATORY_CARE_PROVIDER_SITE_OTHER)

## 2023-11-14 DIAGNOSIS — Z5181 Encounter for therapeutic drug level monitoring: Secondary | ICD-10-CM | POA: Diagnosis not present

## 2023-11-14 DIAGNOSIS — Q2112 Patent foramen ovale: Secondary | ICD-10-CM

## 2023-11-14 DIAGNOSIS — I480 Paroxysmal atrial fibrillation: Secondary | ICD-10-CM

## 2023-11-14 DIAGNOSIS — Z8679 Personal history of other diseases of the circulatory system: Secondary | ICD-10-CM

## 2023-11-14 LAB — POCT INR: INR: 2.9 (ref 2.0–3.0)

## 2023-11-14 NOTE — Progress Notes (Signed)
 Description   INR 2.9, Spoke with pt and instructed her to continue taking Warfarin 1/2 tablet daily except for 1 tablet on Mondays, Wednesdays and Fridays.  Recheck INR in 2 weeks (self-tester).  [facility] [phone]

## 2023-11-14 NOTE — Patient Instructions (Signed)
 Description   INR 2.9, Spoke with pt and instructed her to continue taking Warfarin 1/2 tablet daily except for 1 tablet on Mondays, Wednesdays and Fridays.  Recheck INR in 2 weeks (self-tester).  [facility] [phone]

## 2023-11-28 ENCOUNTER — Ambulatory Visit (INDEPENDENT_AMBULATORY_CARE_PROVIDER_SITE_OTHER): Admitting: *Deleted

## 2023-11-28 DIAGNOSIS — Z8679 Personal history of other diseases of the circulatory system: Secondary | ICD-10-CM

## 2023-11-28 DIAGNOSIS — Z5181 Encounter for therapeutic drug level monitoring: Secondary | ICD-10-CM

## 2023-11-28 DIAGNOSIS — Q2112 Patent foramen ovale: Secondary | ICD-10-CM

## 2023-11-28 DIAGNOSIS — I480 Paroxysmal atrial fibrillation: Secondary | ICD-10-CM

## 2023-11-28 LAB — POCT INR: INR: 3 (ref 2.0–3.0)

## 2023-11-28 NOTE — Patient Instructions (Signed)
 Description   INR 3.0, Spoke with pt and instructed her to continue taking Warfarin 1/2 tablet daily except for 1 tablet on Mondays, Wednesdays and Fridays.  Recheck INR in 2 weeks (self-tester).  [facility] [phone]

## 2023-11-28 NOTE — Progress Notes (Signed)
 Description   INR 3.0, Spoke with pt and instructed her to continue taking Warfarin 1/2 tablet daily except for 1 tablet on Mondays, Wednesdays and Fridays.  Recheck INR in 2 weeks (self-tester).  [facility] [phone]

## 2023-12-01 ENCOUNTER — Other Ambulatory Visit: Payer: Self-pay | Admitting: Internal Medicine

## 2023-12-12 ENCOUNTER — Ambulatory Visit: Payer: Self-pay | Admitting: *Deleted

## 2023-12-12 DIAGNOSIS — Z5181 Encounter for therapeutic drug level monitoring: Secondary | ICD-10-CM | POA: Diagnosis not present

## 2023-12-12 DIAGNOSIS — Q2112 Patent foramen ovale: Secondary | ICD-10-CM

## 2023-12-12 DIAGNOSIS — Z8679 Personal history of other diseases of the circulatory system: Secondary | ICD-10-CM

## 2023-12-12 DIAGNOSIS — I480 Paroxysmal atrial fibrillation: Secondary | ICD-10-CM

## 2023-12-12 LAB — POCT INR: INR: 3.3 — AB (ref 2.0–3.0)

## 2023-12-12 NOTE — Progress Notes (Signed)
 Description   INR 3.3; Spoke with pt and instructed her to continue taking Warfarin 1/2 tablet daily except for 1 tablet on Mondays, Wednesdays and Fridays.  Recheck INR in 2 weeks (self-tester).  [facility] [phone]

## 2023-12-12 NOTE — Patient Instructions (Signed)
 Description   INR 3.3; Spoke with pt and instructed her to continue taking Warfarin 1/2 tablet daily except for 1 tablet on Mondays, Wednesdays and Fridays.  Recheck INR in 2 weeks (self-tester).  [facility] [phone]

## 2023-12-26 ENCOUNTER — Ambulatory Visit (INDEPENDENT_AMBULATORY_CARE_PROVIDER_SITE_OTHER): Admitting: *Deleted

## 2023-12-26 DIAGNOSIS — I480 Paroxysmal atrial fibrillation: Secondary | ICD-10-CM

## 2023-12-26 DIAGNOSIS — Z8679 Personal history of other diseases of the circulatory system: Secondary | ICD-10-CM

## 2023-12-26 DIAGNOSIS — Q2112 Patent foramen ovale: Secondary | ICD-10-CM

## 2023-12-26 DIAGNOSIS — Z5181 Encounter for therapeutic drug level monitoring: Secondary | ICD-10-CM

## 2023-12-26 LAB — POCT INR: INR: 3.1 — AB (ref 2.0–3.0)

## 2023-12-26 NOTE — Patient Instructions (Signed)
 Description   INR 3.1; Spoke with pt and instructed her to continue taking Warfarin 1/2 tablet daily except for 1 tablet on Mondays, Wednesdays and Fridays.  Recheck INR in 2 weeks (self-tester).  [facility] [phone]

## 2023-12-26 NOTE — Progress Notes (Signed)
 Description   INR 3.1; Spoke with pt and instructed her to continue taking Warfarin 1/2 tablet daily except for 1 tablet on Mondays, Wednesdays and Fridays.  Recheck INR in 2 weeks (self-tester).  [facility] [phone]

## 2024-01-09 ENCOUNTER — Ambulatory Visit (INDEPENDENT_AMBULATORY_CARE_PROVIDER_SITE_OTHER): Admitting: *Deleted

## 2024-01-09 DIAGNOSIS — Q2112 Patent foramen ovale: Secondary | ICD-10-CM

## 2024-01-09 DIAGNOSIS — Z8679 Personal history of other diseases of the circulatory system: Secondary | ICD-10-CM

## 2024-01-09 DIAGNOSIS — I480 Paroxysmal atrial fibrillation: Secondary | ICD-10-CM

## 2024-01-09 DIAGNOSIS — Z5181 Encounter for therapeutic drug level monitoring: Secondary | ICD-10-CM

## 2024-01-09 LAB — POCT INR: INR: 3.1 — AB (ref 2.0–3.0)

## 2024-01-09 NOTE — Progress Notes (Signed)
 Description   INR 3.1; Spoke with pt and instructed her to continue taking Warfarin 1/2 tablet daily except for 1 tablet on Mondays, Wednesdays and Fridays.  Recheck INR in 2 weeks (self-tester).  [facility] [phone]

## 2024-01-15 DIAGNOSIS — Z23 Encounter for immunization: Secondary | ICD-10-CM | POA: Diagnosis not present

## 2024-01-15 NOTE — Telephone Encounter (Signed)
 open in error

## 2024-01-23 ENCOUNTER — Ambulatory Visit (INDEPENDENT_AMBULATORY_CARE_PROVIDER_SITE_OTHER): Admitting: *Deleted

## 2024-01-23 DIAGNOSIS — I480 Paroxysmal atrial fibrillation: Secondary | ICD-10-CM

## 2024-01-23 DIAGNOSIS — Z5181 Encounter for therapeutic drug level monitoring: Secondary | ICD-10-CM

## 2024-01-23 DIAGNOSIS — Q2112 Patent foramen ovale: Secondary | ICD-10-CM

## 2024-01-23 DIAGNOSIS — Z8679 Personal history of other diseases of the circulatory system: Secondary | ICD-10-CM

## 2024-01-23 LAB — POCT INR: INR: 2.7 (ref 2.0–3.0)

## 2024-01-23 NOTE — Progress Notes (Signed)
 Description   INR-2.7; Spoke with pt and instructed her to continue taking Warfarin 1/2 tablet daily except for 1 tablet on Mondays, Wednesdays and Fridays.  Recheck INR in 2 weeks (self-tester).  [facility] [phone]

## 2024-01-23 NOTE — Patient Instructions (Signed)
 Description   INR-2.7; Spoke with pt and instructed her to continue taking Warfarin 1/2 tablet daily except for 1 tablet on Mondays, Wednesdays and Fridays.  Recheck INR in 2 weeks (self-tester).  [facility] [phone]

## 2024-01-28 DIAGNOSIS — H1132 Conjunctival hemorrhage, left eye: Secondary | ICD-10-CM | POA: Diagnosis not present

## 2024-01-28 DIAGNOSIS — S0502XA Injury of conjunctiva and corneal abrasion without foreign body, left eye, initial encounter: Secondary | ICD-10-CM | POA: Diagnosis not present

## 2024-01-29 DIAGNOSIS — S0502XA Injury of conjunctiva and corneal abrasion without foreign body, left eye, initial encounter: Secondary | ICD-10-CM | POA: Diagnosis not present

## 2024-01-29 DIAGNOSIS — H20042 Secondary noninfectious iridocyclitis, left eye: Secondary | ICD-10-CM | POA: Diagnosis not present

## 2024-01-31 DIAGNOSIS — H20042 Secondary noninfectious iridocyclitis, left eye: Secondary | ICD-10-CM | POA: Diagnosis not present

## 2024-01-31 DIAGNOSIS — S0502XD Injury of conjunctiva and corneal abrasion without foreign body, left eye, subsequent encounter: Secondary | ICD-10-CM | POA: Diagnosis not present

## 2024-02-06 ENCOUNTER — Ambulatory Visit (INDEPENDENT_AMBULATORY_CARE_PROVIDER_SITE_OTHER)

## 2024-02-06 DIAGNOSIS — I480 Paroxysmal atrial fibrillation: Secondary | ICD-10-CM | POA: Diagnosis not present

## 2024-02-06 DIAGNOSIS — Z5181 Encounter for therapeutic drug level monitoring: Secondary | ICD-10-CM | POA: Diagnosis not present

## 2024-02-06 DIAGNOSIS — Q2112 Patent foramen ovale: Secondary | ICD-10-CM

## 2024-02-06 DIAGNOSIS — Z8679 Personal history of other diseases of the circulatory system: Secondary | ICD-10-CM

## 2024-02-06 LAB — POCT INR: INR: 3.2 — AB (ref 2.0–3.0)

## 2024-02-06 NOTE — Patient Instructions (Signed)
 Description   INR-3.2; Spoke with pt and instructed her to continue taking Warfarin 1/2 tablet daily except for 1 tablet on Mondays, Wednesdays and Fridays.  Recheck INR in 2 weeks (self-tester).  [facility] [phone]

## 2024-02-06 NOTE — Progress Notes (Signed)
 Description   INR-3.2; Spoke with pt and instructed her to continue taking Warfarin 1/2 tablet daily except for 1 tablet on Mondays, Wednesdays and Fridays.  Recheck INR in 2 weeks (self-tester).  [facility] [phone]

## 2024-02-08 DIAGNOSIS — H2000 Unspecified acute and subacute iridocyclitis: Secondary | ICD-10-CM | POA: Diagnosis not present

## 2024-02-20 ENCOUNTER — Ambulatory Visit: Payer: Self-pay

## 2024-02-20 ENCOUNTER — Telehealth: Payer: Self-pay | Admitting: *Deleted

## 2024-02-20 DIAGNOSIS — I480 Paroxysmal atrial fibrillation: Secondary | ICD-10-CM

## 2024-02-20 DIAGNOSIS — Z5181 Encounter for therapeutic drug level monitoring: Secondary | ICD-10-CM

## 2024-02-20 DIAGNOSIS — Q2112 Patent foramen ovale: Secondary | ICD-10-CM

## 2024-02-20 DIAGNOSIS — Z8679 Personal history of other diseases of the circulatory system: Secondary | ICD-10-CM

## 2024-02-20 LAB — POCT INR: INR: 3.3 — AB (ref 2.0–3.0)

## 2024-02-20 NOTE — Telephone Encounter (Signed)
 Called patient since INR is due; there was no answer so left her a message regarding this.

## 2024-03-05 ENCOUNTER — Ambulatory Visit (INDEPENDENT_AMBULATORY_CARE_PROVIDER_SITE_OTHER): Admitting: *Deleted

## 2024-03-05 DIAGNOSIS — Z8679 Personal history of other diseases of the circulatory system: Secondary | ICD-10-CM

## 2024-03-05 DIAGNOSIS — I480 Paroxysmal atrial fibrillation: Secondary | ICD-10-CM

## 2024-03-05 DIAGNOSIS — Z5181 Encounter for therapeutic drug level monitoring: Secondary | ICD-10-CM

## 2024-03-05 DIAGNOSIS — Q2112 Patent foramen ovale: Secondary | ICD-10-CM

## 2024-03-05 LAB — POCT INR: INR: 3 (ref 2.0–3.0)

## 2024-03-05 NOTE — Patient Instructions (Signed)
 Description   INR-3.0; Spoke with pt and instructed her to continue taking Warfarin 1/2 tablet daily except for 1 tablet on Mondays, Wednesdays and Fridays.  Recheck INR in 2 weeks (self-tester).  [facility] [phone]

## 2024-03-05 NOTE — Progress Notes (Signed)
 Description   INR-3.0; Spoke with pt and instructed her to continue taking Warfarin 1/2 tablet daily except for 1 tablet on Mondays, Wednesdays and Fridays.  Recheck INR in 2 weeks (self-tester).  [facility] [phone]

## 2024-03-11 ENCOUNTER — Other Ambulatory Visit: Payer: Self-pay

## 2024-03-11 MED ORDER — WARFARIN SODIUM 5 MG PO TABS: ORAL_TABLET | ORAL | 1 refills | Status: AC

## 2024-03-19 ENCOUNTER — Ambulatory Visit (INDEPENDENT_AMBULATORY_CARE_PROVIDER_SITE_OTHER)

## 2024-03-19 DIAGNOSIS — Z8679 Personal history of other diseases of the circulatory system: Secondary | ICD-10-CM

## 2024-03-19 DIAGNOSIS — I480 Paroxysmal atrial fibrillation: Secondary | ICD-10-CM

## 2024-03-19 DIAGNOSIS — Q2112 Patent foramen ovale: Secondary | ICD-10-CM

## 2024-03-19 DIAGNOSIS — Z5181 Encounter for therapeutic drug level monitoring: Secondary | ICD-10-CM

## 2024-03-19 LAB — POCT INR: INR: 2.6 (ref 2.0–3.0)

## 2024-04-02 ENCOUNTER — Ambulatory Visit (INDEPENDENT_AMBULATORY_CARE_PROVIDER_SITE_OTHER): Admitting: *Deleted

## 2024-04-02 DIAGNOSIS — I480 Paroxysmal atrial fibrillation: Secondary | ICD-10-CM

## 2024-04-02 DIAGNOSIS — Z5181 Encounter for therapeutic drug level monitoring: Secondary | ICD-10-CM

## 2024-04-02 DIAGNOSIS — Z8679 Personal history of other diseases of the circulatory system: Secondary | ICD-10-CM

## 2024-04-02 DIAGNOSIS — Q2112 Patent foramen ovale: Secondary | ICD-10-CM

## 2024-04-02 LAB — POCT INR: INR: 2.8 (ref 2.0–3.0)

## 2024-04-02 NOTE — Patient Instructions (Signed)
 Description   INR-2.8; Spoke with pt and instructed her to continue taking Warfarin 1/2 tablet daily except for 1 tablet on Mondays, Wednesdays and Fridays.  Recheck INR in 2 weeks (self-tester).  [facility] [phone]

## 2024-04-02 NOTE — Progress Notes (Signed)
 Description   INR-2.8; Spoke with pt and instructed her to continue taking Warfarin 1/2 tablet daily except for 1 tablet on Mondays, Wednesdays and Fridays.  Recheck INR in 2 weeks (self-tester).  [facility] [phone]

## 2024-05-14 ENCOUNTER — Ambulatory Visit: Admitting: Internal Medicine

## 2024-06-25 ENCOUNTER — Ambulatory Visit
